# Patient Record
Sex: Male | Born: 1945 | Race: White | Hispanic: No | Smoking: Never smoker
Health system: Southern US, Community
[De-identification: ages and names within clinical notes are randomized; demographics above are authoritative.]

## PROBLEM LIST (undated history)

## (undated) DIAGNOSIS — E039 Hypothyroidism, unspecified: Secondary | ICD-10-CM

## (undated) DIAGNOSIS — Z87442 Personal history of urinary calculi: Secondary | ICD-10-CM

## (undated) DIAGNOSIS — K219 Gastro-esophageal reflux disease without esophagitis: Secondary | ICD-10-CM

## (undated) DIAGNOSIS — I472 Ventricular tachycardia: Secondary | ICD-10-CM

## (undated) DIAGNOSIS — S83242A Other tear of medial meniscus, current injury, left knee, initial encounter: Secondary | ICD-10-CM

## (undated) DIAGNOSIS — M1990 Unspecified osteoarthritis, unspecified site: Secondary | ICD-10-CM

## (undated) DIAGNOSIS — I493 Ventricular premature depolarization: Secondary | ICD-10-CM

## (undated) DIAGNOSIS — F32A Depression, unspecified: Secondary | ICD-10-CM

## (undated) DIAGNOSIS — G4730 Sleep apnea, unspecified: Secondary | ICD-10-CM

## (undated) DIAGNOSIS — I4729 Other ventricular tachycardia: Secondary | ICD-10-CM

## (undated) DIAGNOSIS — Z8719 Personal history of other diseases of the digestive system: Secondary | ICD-10-CM

## (undated) DIAGNOSIS — I10 Essential (primary) hypertension: Secondary | ICD-10-CM

## (undated) DIAGNOSIS — E079 Disorder of thyroid, unspecified: Secondary | ICD-10-CM

## (undated) DIAGNOSIS — F329 Major depressive disorder, single episode, unspecified: Secondary | ICD-10-CM

## (undated) DIAGNOSIS — C3490 Malignant neoplasm of unspecified part of unspecified bronchus or lung: Secondary | ICD-10-CM

## (undated) HISTORY — DX: Unspecified osteoarthritis, unspecified site: M19.90

## (undated) HISTORY — PX: RETINAL DETACHMENT SURGERY: SHX105

## (undated) HISTORY — PX: EYE SURGERY: SHX253

## (undated) HISTORY — PX: KNEE ARTHROSCOPY: SUR90

## (undated) HISTORY — PX: CATARACT EXTRACTION: SUR2

---

## 2001-02-02 ENCOUNTER — Emergency Department (HOSPITAL_COMMUNITY): Admission: EM | Admit: 2001-02-02 | Discharge: 2001-02-03 | Payer: Self-pay | Admitting: Emergency Medicine

## 2001-02-05 ENCOUNTER — Ambulatory Visit (HOSPITAL_COMMUNITY): Admission: RE | Admit: 2001-02-05 | Discharge: 2001-02-06 | Payer: Self-pay | Admitting: Ophthalmology

## 2001-02-05 ENCOUNTER — Encounter: Payer: Self-pay | Admitting: Ophthalmology

## 2001-12-05 ENCOUNTER — Encounter (HOSPITAL_COMMUNITY): Admission: RE | Admit: 2001-12-05 | Discharge: 2002-01-04 | Payer: Self-pay | Admitting: *Deleted

## 2001-12-10 ENCOUNTER — Ambulatory Visit (HOSPITAL_COMMUNITY): Admission: RE | Admit: 2001-12-10 | Discharge: 2001-12-11 | Payer: Self-pay | Admitting: Ophthalmology

## 2001-12-10 ENCOUNTER — Encounter: Payer: Self-pay | Admitting: Ophthalmology

## 2002-03-05 ENCOUNTER — Ambulatory Visit (HOSPITAL_COMMUNITY): Admission: RE | Admit: 2002-03-05 | Discharge: 2002-03-05 | Payer: Self-pay | Admitting: General Surgery

## 2002-12-19 ENCOUNTER — Encounter: Payer: Self-pay | Admitting: Family Medicine

## 2002-12-19 ENCOUNTER — Ambulatory Visit (HOSPITAL_COMMUNITY): Admission: RE | Admit: 2002-12-19 | Discharge: 2002-12-19 | Payer: Self-pay | Admitting: Family Medicine

## 2004-03-05 ENCOUNTER — Ambulatory Visit: Admission: RE | Admit: 2004-03-05 | Discharge: 2004-03-05 | Payer: Self-pay | Admitting: Family Medicine

## 2005-05-02 ENCOUNTER — Ambulatory Visit (HOSPITAL_COMMUNITY): Admission: RE | Admit: 2005-05-02 | Discharge: 2005-05-02 | Payer: Self-pay | Admitting: Family Medicine

## 2005-11-01 ENCOUNTER — Emergency Department (HOSPITAL_COMMUNITY): Admission: EM | Admit: 2005-11-01 | Discharge: 2005-11-01 | Payer: Self-pay | Admitting: Emergency Medicine

## 2007-01-18 ENCOUNTER — Ambulatory Visit (HOSPITAL_COMMUNITY): Admission: RE | Admit: 2007-01-18 | Discharge: 2007-01-18 | Payer: Self-pay | Admitting: Family Medicine

## 2007-01-19 ENCOUNTER — Emergency Department (HOSPITAL_COMMUNITY): Admission: EM | Admit: 2007-01-19 | Discharge: 2007-01-19 | Payer: Self-pay | Admitting: Emergency Medicine

## 2007-12-28 ENCOUNTER — Ambulatory Visit (HOSPITAL_COMMUNITY): Admission: RE | Admit: 2007-12-28 | Discharge: 2007-12-28 | Payer: Self-pay | Admitting: Family Medicine

## 2008-06-14 ENCOUNTER — Ambulatory Visit (HOSPITAL_COMMUNITY): Admission: RE | Admit: 2008-06-14 | Discharge: 2008-06-14 | Payer: Self-pay | Admitting: Family Medicine

## 2008-06-18 ENCOUNTER — Ambulatory Visit (HOSPITAL_COMMUNITY): Admission: RE | Admit: 2008-06-18 | Discharge: 2008-06-18 | Payer: Self-pay | Admitting: Family Medicine

## 2008-07-06 ENCOUNTER — Emergency Department (HOSPITAL_COMMUNITY): Admission: EM | Admit: 2008-07-06 | Discharge: 2008-07-06 | Payer: Self-pay | Admitting: Emergency Medicine

## 2008-09-12 HISTORY — PX: OTHER SURGICAL HISTORY: SHX169

## 2009-04-09 ENCOUNTER — Ambulatory Visit (HOSPITAL_COMMUNITY): Admission: RE | Admit: 2009-04-09 | Discharge: 2009-04-09 | Payer: Self-pay | Admitting: Family Medicine

## 2011-01-28 NOTE — Op Note (Signed)
[place]  Patient:    Micheal Baker, Micheal Baker Visit Number: [id number] MRN: [id number]          Service Type: DSU Location: [phone] Attending Physician:  Ladona Ridgel Dictated by:   Ladona Ridgel, M.D. Proc. 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  POSTOPERATIVE DIAGNOSES: 1. 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 2. Retinal break.  OPERATION:  Pars plana vitrectomy, posterior capsulectomy, membrane peeling and laser photocoagulation for retinal break and air/fluid change, left eye.  SURGEON:  Ladona Ridgel, M.D.  ANESTHESIA:  General endotracheal.  ESTIMATED BLOOD LOSS:  Less than 1 cc.  COMPLICATIONS:  None.  DESCRIPTION OF PROCEDURE:  The patient was taken to the operating room where after the induction of general anesthesia, the left eye was prepped and draped in the usual fashion.  Lid speculum was introduced.  Conjunctival peritomies were developed over the future sclerotomy sites at 1:30, 10:30 and 4. Hemostasis was obtained.  The sclerotomy was fashioned 3 mm posterior to the limbus in both locations.  The infusion cannula was secured with a suture of 7-0 Vicryl and found to be in good position.  The Landers ring was secured to the globe with 7-0 Vicryl sutures at 3 and 9.  A magnifying BIOM lens was applied to the surface of the eye.  The central vitreous haze was washed out. There was a linear area of preretinal fibrosis over the macula extending in a somewhat arcuate fashion inferotemporally.  There was central capsule fibrosis impeding the view of the posterior segment and so the posterior capsule was opened with the vitrector.  A Vicryl stitch was used to engage the margin over the thick preretinal fibrosis superonasally to the  fixation.  The dense membrane was then peeled off the macula temporally.  Temporally, the membrane was more adherent.  The retina was seen to tent up slightly.  The membrane was elevated and teased off the surface of the retina without any retinal breaks or tears.  Inspection of the macular area showed there was still to be some glistening surface.  An additional probe revealed there to be a second very adherent membrane underneath the first.  This was grasped with the Dorc forceps and peeled off the macula.  There was a large membrane that extended temporally.  The area that had been pulled up before was noted to have a small tear in this area.  The membrane was then peeled around this area of tear and then removed from the area of the tear so that no residual traction was seen. The macula was inspected, and no additional membranes were seen.  The instruments were removed from the eye and the holes were plugged.  The Landers ring was removed.  Inspect with the direct ophthalmoscope revealed there to be no peripheral retinal breaks or tears.  Attention was then directed back into the eye where a endolaser probe was used to photocoagulate around the margins of the tear, the tear being temporal to the macula.  A biconcave lens was then applied to the surface of the air, and an air-fluid exchange was performed. The superior sclerotomy was then closed with 7-0 Vicryl.  The conjunctivae was  drawn up and reapproximated with running sutures of 0 plain gut.  Pressure was checked with the Barraquer tonometer and found to be 15.  The subconjunctiva was irrigated with 0.75% Marcaine followed by subconjunctival injection of 100 mg of ceftazidime and 10 mg of Decadron. The lid speculum was then removed and an eye patch and shield were then placed over the patients eye.  The patient was awakened from anesthesia and left the operating room in stable condition. Dictated by:   Ladona Ridgel,  M.D. Attending Physician:  Ladona Ridgel DD:  12/10/01 TD:  12/10/01 Job: [id number]

## 2011-01-28 NOTE — Op Note (Signed)
[place]  Patient:    Micheal Baker, Micheal Baker                    MRN: [id number] Proc. Date: 02/05/01 Adm. 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  PROCEDURES:  Pars plana vitrectomy, laser for retinal breaks x 2, gas/fluid exchange - left eye.  SURGEON:  Ladona Ridgel, M.D.  ASSISTANT:  Ulysees Barns, LPN.  ANESTHESIA:  General endotracheal.  ESTIMATED BLOOD LOSS:  Less than 1 cc.  COMPLICATIONS:  None.  DESCRIPTION OF PROCEDURE:  The patient was taken to the operating room and after induction of general anesthesia, the left eye was prepped and draped in usual fashion.  A lid speculum was introduced temporally, an incision was made from 1 to 5 and nasally from 11 to 9 with relaxing incisions at 9 and 3. Hemostasis was obtained with eraser cautery.  Sclerotomies were then fashioned 3.75 mm posterior limbus at 1:30, 10:30 and 4.  The superior sclerotomies were plugged and a 4 mm infusion cannula was secured at the 4 oclock sclerotomy with a temporary suture of 7-0 Vicryl.  The tip was visually inspected and found to be in good position.  A Landers ring was then secured to the globe with 7-0 Vicryl suture at 3 and 9.  The plugs were removed and 30 degree prismatic lens applied to the surface of the eye.   A central core, followed by a peripheral vitrectomy was performed.  There was considerable intravitreal hemorrhage centrally and once the core vitrectomy had been cleared of the posterior segment, peripheral vitrectomy was performed.  There was a large retinal break with a localized detachment noted preoperatively at approximately 12:30 and this was confirmed intraoperatively.   There was also a flap horseshoe tear with a small pigmented surface and a very small area surrounding subretinal fluid at approximately 4:30.  No other breaks or tears were seen, and scleral depression was used to trim the vitreous base 360 degrees.  The instruments were removed from the eye and holes plugged. Inspection with scleral depression and the indirect ophthalmoscope revealed there to be no additional retinal breaks or tears.  The headscope laser was then used to photocoagulate the attached retina, as well as scattered photocoagulations around the breaks using scleral depressions to approxdimate the retina to the right neural pigmented epithelium.  An extrusion cannula was then used with the indirect ophthalmoscope to perform a gas/fluid exchange, and then additional laser photocoagulation was applied in and around both breaks.  A small amount of fluid settled posteriorly, but not into the macular area from the localized attachment superiorly.  A mixture of 16% C3F8 was drawn up in the usual sterile fashion and then the fluid that had settled posteriorly was removed with the biconcave lens and the extrusion cannula.  The superonasal sclerotomy was permanently closed and then 20 cc of a 16% C3F8 mixture infused with the infusion cannula with egress to the remaining sclerotomy.  The remaining sclerotomy was then closed.  The infusion cannula was removed and preplaced sutures secured.  The pressure was adjusted to 21 mm Barraquer by additional injection of the filtered  gas mixture with a 30-gauge needle 3.75 mm posterior limbus 11 oclock. The pressure was rechecked and found to be 21 once the needle had been removed.  The conjunctiva was then drawn up and reapproximated with interrupted running sutures of 6-0 plain gut. The subconjunctival space was then irrigated with 0.75% Marcaine followed by subconjunctival injections of 100 mg of ceftazidime and 25 mg of Decadron.  The lid  speculum was then removed and a mixed antibiotic ointment was applied to the surface of the eye.  Eye patch and shield was then placed over the patients eye.  Following awakening from anesthesia, patient left the operating room in stable condition. DD:  02/05/01 TD:  02/05/01 Job: [id number]

## 2011-06-13 LAB — D-DIMER, QUANTITATIVE: D-Dimer, Quant: 0.28

## 2011-06-13 LAB — DIFFERENTIAL
Basophils Relative: 0
Eosinophils Absolute: 0.2
Eosinophils Relative: 5
Lymphocytes Relative: 26
Monocytes Absolute: 0.5
Neutrophils Relative %: 61

## 2011-06-13 LAB — BASIC METABOLIC PANEL
BUN: 14
CO2: 26
Chloride: 106
GFR calc Af Amer: >60
Glucose, Bld: 103 — ABNORMAL HIGH
Sodium: 138

## 2011-06-13 LAB — POCT CARDIAC MARKERS
CKMB, poc: 1.7
Troponin i, poc: <0.05

## 2011-06-13 LAB — CBC
MCHC: 35.2
RBC: 5.13
WBC: 5.5

## 2011-08-08 ENCOUNTER — Encounter: Payer: Self-pay | Admitting: *Deleted

## 2011-08-08 ENCOUNTER — Observation Stay (HOSPITAL_COMMUNITY)
Admission: EM | Admit: 2011-08-08 | Discharge: 2011-08-09 | Disposition: A | Discharge status: Home / Self Care | Payer: BC Managed Care – PPO | Attending: Internal Medicine | Admitting: Internal Medicine

## 2011-08-08 ENCOUNTER — Emergency Department (HOSPITAL_COMMUNITY): Payer: BC Managed Care – PPO

## 2011-08-08 DIAGNOSIS — R05 Cough: Secondary | ICD-10-CM | POA: Insufficient documentation

## 2011-08-08 DIAGNOSIS — I10 Essential (primary) hypertension: Secondary | ICD-10-CM | POA: Diagnosis present

## 2011-08-08 DIAGNOSIS — E039 Hypothyroidism, unspecified: Secondary | ICD-10-CM | POA: Diagnosis present

## 2011-08-08 DIAGNOSIS — R42 Dizziness and giddiness: Secondary | ICD-10-CM | POA: Insufficient documentation

## 2011-08-08 DIAGNOSIS — R059 Cough, unspecified: Secondary | ICD-10-CM | POA: Insufficient documentation

## 2011-08-08 DIAGNOSIS — R0789 Other chest pain: Principal | ICD-10-CM | POA: Diagnosis present

## 2011-08-08 HISTORY — DX: Disorder of thyroid, unspecified: E07.9

## 2011-08-08 HISTORY — DX: Essential (primary) hypertension: I10

## 2011-08-08 LAB — URINALYSIS, ROUTINE W REFLEX MICROSCOPIC
Bilirubin Urine: NEGATIVE
Hgb urine dipstick: NEGATIVE
Ketones, ur: NEGATIVE mg/dL
Urobilinogen, UA: 0.2 mg/dL (ref 0.0–1.0)
pH: 7 (ref 5.0–8.0)

## 2011-08-08 LAB — COMPREHENSIVE METABOLIC PANEL
ALT: 21 U/L (ref 0–53)
AST: 25 U/L (ref 0–37)
Alkaline Phosphatase: 68 U/L (ref 39–117)
BUN: 14 mg/dL (ref 6–23)
Chloride: 101 mEq/L (ref 96–112)
Creatinine, Ser: 0.96 mg/dL (ref 0.50–1.35)
GFR calc Af Amer: >90 mL/min (ref >90)
GFR calc non Af Amer: 86 mL/min — ABNORMAL LOW (ref >90)
Potassium: 3.5 mEq/L (ref 3.5–5.1)
Sodium: 139 mEq/L (ref 135–145)
Total Protein: 6.9 g/dL (ref 6.0–8.3)

## 2011-08-08 LAB — CBC
HCT: 46.3 % (ref 39.0–52.0)
Platelets: 205 10*3/uL (ref 150–400)
RDW: 13 % (ref 11.5–15.5)

## 2011-08-08 LAB — D-DIMER, QUANTITATIVE: D-Dimer, Quant: 0.22 ug/mL-FEU (ref 0.00–0.48)

## 2011-08-08 LAB — CARDIAC PANEL(CRET KIN+CKTOT+MB+TROPI)
CK, MB: 6.6 ng/mL (ref 0.3–4.0)
Relative Index: 3.4 — ABNORMAL HIGH (ref 0.0–2.5)
Total CK: 204 U/L (ref 7–232)
Total CK: 166 U/L (ref 7–232)
Troponin I: <0.3 ng/mL (ref <0.30)

## 2011-08-08 MED ORDER — ZOLPIDEM TARTRATE 5 MG PO TABS: 10.0000 mg | ORAL_TABLET | Freq: Every evening | ORAL | Status: DC | PRN

## 2011-08-08 MED ORDER — MORPHINE SULFATE 4 MG/ML IJ SOLN
0.5000 mg | Freq: Once | INTRAMUSCULAR | Status: AC
Start: 2011-08-08 — End: 2011-08-08

## 2011-08-08 MED ORDER — ACETAMINOPHEN 325 MG PO TABS
650.0000 mg | ORAL_TABLET | Freq: Four times a day (QID) | ORAL | Status: DC | PRN
  Administered 2011-08-08: 650 mg via ORAL
  Filled 2011-08-08: qty 2

## 2011-08-08 MED ORDER — ONDANSETRON HCL 4 MG/2ML IJ SOLN: 4.0000 mg | Freq: Three times a day (TID) | INTRAMUSCULAR | Status: AC | PRN

## 2011-08-08 MED ORDER — METOPROLOL SUCCINATE ER 25 MG PO TB24
25.0000 mg | ORAL_TABLET | Freq: Every day | ORAL | Status: DC
  Administered 2011-08-09: 25 mg via ORAL
  Filled 2011-08-08: qty 1

## 2011-08-08 MED ORDER — HYDROMORPHONE HCL PF 1 MG/ML IJ SOLN: 1.0000 mg | INTRAMUSCULAR | Status: AC | PRN

## 2011-08-08 MED ORDER — SODIUM CHLORIDE 0.9 % IV SOLN: 250.0000 mL | INTRAVENOUS | Status: DC

## 2011-08-08 MED ORDER — SODIUM CHLORIDE 0.9 % IJ SOLN
3.0000 mL | Freq: Two times a day (BID) | INTRAMUSCULAR | Status: DC
  Filled 2011-08-08: qty 3

## 2011-08-08 MED ORDER — NITROGLYCERIN 2 % TD OINT
0.5000 [in_us] | TOPICAL_OINTMENT | Freq: Once | TRANSDERMAL | Status: AC
  Administered 2011-08-08: 0.5 [in_us] via TOPICAL
  Filled 2011-08-08: qty 30

## 2011-08-08 MED ORDER — ROPINIROLE HCL 0.25 MG PO TABS
0.5000 mg | ORAL_TABLET | Freq: Every evening | ORAL | Status: DC | PRN
  Filled 2011-08-08: qty 2

## 2011-08-08 MED ORDER — GUAIFENESIN-DM 100-10 MG/5ML PO SYRP: 5.0000 mL | ORAL_SOLUTION | ORAL | Status: DC | PRN

## 2011-08-08 MED ORDER — SODIUM CHLORIDE 0.9 % IJ SOLN: 3.0000 mL | INTRAMUSCULAR | Status: DC | PRN

## 2011-08-08 MED ORDER — SENNA 8.6 MG PO TABS: 2.0000 | ORAL_TABLET | Freq: Every day | ORAL | Status: DC | PRN

## 2011-08-08 MED ORDER — ACETAMINOPHEN 650 MG RE SUPP: 650.0000 mg | Freq: Four times a day (QID) | RECTAL | Status: DC | PRN

## 2011-08-08 MED ORDER — ASPIRIN 81 MG PO CHEW
324.0000 mg | CHEWABLE_TABLET | Freq: Once | ORAL | Status: AC
  Administered 2011-08-08: 324 mg via ORAL
  Filled 2011-08-08: qty 4

## 2011-08-08 MED ORDER — ALUM & MAG HYDROXIDE-SIMETH 200-200-20 MG/5ML PO SUSP
30.0000 mL | Freq: Four times a day (QID) | ORAL | Status: DC | PRN
Start: 2011-08-08 — End: 2011-08-09

## 2011-08-08 MED ORDER — LEVOTHYROXINE SODIUM 50 MCG PO TABS
50.0000 ug | ORAL_TABLET | Freq: Every day | ORAL | Status: DC
  Administered 2011-08-09: 50 ug via ORAL
  Filled 2011-08-08: qty 1

## 2011-08-08 MED ORDER — HYDROCODONE-ACETAMINOPHEN 5-325 MG PO TABS
ORAL_TABLET | ORAL | Status: AC
  Administered 2011-08-08: 2 via ORAL
  Filled 2011-08-08: qty 2

## 2011-08-08 MED ORDER — HYDROCODONE-ACETAMINOPHEN 5-325 MG PO TABS
1.0000 | ORAL_TABLET | ORAL | Status: DC | PRN
  Administered 2011-08-08 – 2011-08-09 (×2): 2 via ORAL
  Filled 2011-08-08: qty 2

## 2011-08-08 MED ORDER — ENOXAPARIN SODIUM 40 MG/0.4ML ~~LOC~~ SOLN
40.0000 mg | SUBCUTANEOUS | Status: DC
  Administered 2011-08-08: 40 mg via SUBCUTANEOUS
  Filled 2011-08-08: qty 0.4

## 2011-08-08 MED ORDER — OMEGA-3-ACID ETHYL ESTERS 1 G PO CAPS
2.0000 g | ORAL_CAPSULE | Freq: Every day | ORAL | Status: DC
  Administered 2011-08-08 – 2011-08-09 (×2): 2 g via ORAL
  Filled 2011-08-08 (×2): qty 2

## 2011-08-08 MED ORDER — MORPHINE SULFATE 4 MG/ML IJ SOLN
INTRAMUSCULAR | Status: AC
  Administered 2011-08-08: 0.5 mg
  Filled 2011-08-08: qty 1

## 2011-08-08 MED ORDER — MORPHINE SULFATE 4 MG/ML IJ SOLN: 2.0000 mg | INTRAMUSCULAR | Status: DC | PRN

## 2011-08-08 MED ORDER — OMEGA-3 FATTY ACIDS 1000 MG PO CAPS: 2.0000 g | ORAL_CAPSULE | Freq: Every day | ORAL | Status: DC

## 2011-08-08 NOTE — ED Notes (Signed)
CRITICAL VALUE ALERT  Critical value received: CKMB 6.6  Date of notification:08-08-11  Time of notification: *0825  Critical value read back:yes  Nurse who received alert:  Alfredo Martinez RN  MD notified (1st page):  Jeraldine Loots  Time of first page: 0825  MD notified (2nd page):  Time of second page:  Responding MD:  Jeraldine Loots- no new orders received  Time MD responded:  0825

## 2011-08-08 NOTE — ED Notes (Signed)
Pt c/o headache. Dr. Jeraldine Loots made aware and new order received

## 2011-08-08 NOTE — ED Notes (Signed)
Pt states that he woke up this morning and was having chest pain at 5:30. Denies shortness of breath or nausea. Pt states that he went to the grocery store yesterday and became dizzy and felt like he was going to pass out. Pt denies dizziness at this time. Pt states "I just don't feel right."

## 2011-08-08 NOTE — ED Notes (Signed)
ISTAT Trop: 0.00 

## 2011-08-08 NOTE — H&P (Signed)
Hospital Admission Note Date: 08/08/2011  Patient name: Micheal Baker Medical record number: [id number] Date of birth: March 01, 1946 Age: 65 y.o. Gender: male PCP: Kirk Ruths, MD  Attending physician: Christiane Ha  Chief Complaint: Chest pain  History of Present Illness:  Micheal Baker is an 65 y.o. male who presents with intermittent left-sided chest pain. He reports that this dull. It usually lasts a few minutes at a time but lasted for about an hour today. It was worse with leaning over. It was not exertional. He has had a slight cough. He exercises 3 times a week without difficulty. He had an episode of lightheadedness yesterday. He has a history of hypertension and hypothyroidism. He had a negative Cardiolite about 5 years ago with [facility] per his report. There is no radiation of the pain. No other symptoms. EKG and cardiac enzymes were significant for a slightly elevated MB fraction only. He did have recent travel to [facility] and back via car. He has never had a blood clot. He's been eating and drinking well.  Past Medical History  Diagnosis Date  . Hypertension   . Thyroid disease     Meds: Prescriptions prior to admission  Medication Sig Dispense Refill  . fish oil-omega-3 fatty acids 1000 MG capsule Take 2 g by mouth daily.        Marland Kitchen glucosamine-chondroitin 500-400 MG tablet Take 1 tablet by mouth daily.        Marland Kitchen levothyroxine (SYNTHROID, LEVOTHROID) 50 MCG tablet Take 50 mcg by mouth daily.        . metoprolol (TOPROL-XL) 50 MG 24 hr tablet Take 25 mg by mouth daily.        Marland Kitchen rOPINIRole (REQUIP) 0.5 MG tablet Take 0.5 mg by mouth at bedtime as needed. Restless leg         Allergies: Penicillins History   Social History  . Marital Status: Divorced    Spouse Name: N/A    Number of Children: N/A  . Years of Education: N/A   Occupational History  . Not on file.   Social History Main Topics  . Smoking status: Never Smoker   .  Smokeless tobacco: Not on file  . Alcohol Use: Yes     occasionally  . Drug Use: No  . Sexually Active:    Other Topics Concern  . Not on file   Social History Narrative  . No narrative on file   History reviewed. No pertinent family history. Past Surgical History  Procedure Date  . Retinal detachment surgery     left  . Cataract extraction     left  . Eye surgery     right  . Knee arthroscopy     left  . Groin surgery     growth removed     Review of Systems: Systems reviewed and as per HPI, otherwise negative.  Physical Exam: Blood pressure 110/69, pulse 54, temperature 97.1 F (36.2 C), temperature source Oral, resp. rate 16, height 6\' 5"  (1.956 m), weight 90.7 kg (199 lb 15.3 oz), SpO2 97.00%. BP 110/69  Pulse 54  Temp(Src) 97.1 F (36.2 C) (Oral)  Resp 16  Ht 6\' 5"  (1.956 m)  Wt 90.7 kg (199 lb 15.3 oz)  BMI 23.71 kg/m2  SpO2 97%  General Appearance:    Alert, cooperative, no distress, appears stated age  Head:    Normocephalic, without obvious abnormality, atraumatic  Eyes:    PERRL, conjunctiva/corneas clear, EOM's intact,  fundi    benign, both eyes       Ears:    Normal TM's and external ear canals, both ears  Nose:   Nares normal, septum midline, mucosa normal, no drainage    or sinus tenderness  Throat:   Lips, mucosa, and tongue normal; teeth and gums normal  Neck:   Supple, symmetrical, trachea midline, no adenopathy;       thyroid:  No enlargement/tenderness/nodules; no carotid   bruit or JVD  Back:     Symmetric, no curvature, ROM normal, no CVA tenderness  Lungs:     Clear to auscultation bilaterally, respirations unlabored  Chest wall:    No tenderness or deformity  Heart:    Regular rate and rhythm, S1 and S2 normal, no murmur, rub   or gallop  Abdomen:     Soft, non-tender, bowel sounds active all four quadrants,    no masses, no organomegaly  Genitalia:   deferred   Rectal:   deferred   Extremities:   Extremities normal, atraumatic, no  cyanosis or edema  Pulses:   2+ and symmetric all extremities  Skin:   Skin color, texture, turgor normal, no rashes or lesions  Lymph nodes:   Cervical, supraclavicular, and axillary nodes normal  Neurologic:   CNII-XII intact. Normal strength, sensation and reflexes      throughout    Psychiatric: Normal affect.  Lab results: Basic Metabolic Panel:  Basename 08/08/11 0745  NA 139  K 3.5  CL 101  CO2 31  GLUCOSE 103*  BUN 14  CREATININE 0.96  CALCIUM 9.5  MG --  PHOS --   Liver Function Tests:  Basename 08/08/11 0745  AST 25  ALT 21  ALKPHOS 68  BILITOT 0.7  PROT 6.9  ALBUMIN 3.9   No results found for this basename: LIPASE:2,AMYLASE:2 in the last 72 hours No results found for this basename: AMMONIA:2 in the last 72 hours CBC:  Basename 08/08/11 0745  WBC 5.3  NEUTROABS --  HGB 15.9  HCT 46.3  MCV 87.7  PLT 205   Cardiac Enzymes:  Basename 08/08/11 1454 08/08/11 0745  CKTOTAL 166 204  CKMB 5.6* 6.6*  CKMBINDEX -- --  TROPONINI <0.30 <0.30   EKG shows sinus bradycardia with rate of 55 nonspecific changes  Imaging results:  Dg Chest 2 View  08/08/2011  *RADIOLOGY REPORT*  Clinical Data: History of chest pain.  History of hypertension.  CHEST - 2 VIEW  Comparison: 07/06/2008 study.  Findings: Cardiac silhouette is normal size and shape. The heart appears stable.  Mediastinal and hilar contours appear stable. Hilar and pulmonary granulomatous calcifications are present without significant change.  No active granulomatous process is evident.  No pleural abnormality is evident.Bones  appear average for age.  IMPRESSION: Stable appearance of chest.  Evidence of previous granulomatous infection with calcifications.  No active granulomatous process evident.  No acute or active abnormality is evident.  Original Report Authenticated By: Crawford Givens, M.D.    Assessment & Plan: Principal Problem:  *Chest pain Active Problems:  Benign hypertension   Hypothyroidism  Patient will be placed on observation. Rule out MI. Check d-dimer and if positive a CT angiogram of the chest. If workup is unremarkable and he feels better, consider discharge with outpatient followup with [facility]. Lexx Monte L 08/08/2011, 4:27 PM

## 2011-08-08 NOTE — ED Notes (Signed)
0.5mg  Morphine IV given x 1 dose

## 2011-08-08 NOTE — ED Provider Notes (Signed)
History   This chart was scribed for Micheal Munch, MD by Clarita Crane. The patient was seen in room APA10 and the patient's care was started at 7:40AM.   CSN: [id number] Arrival date & time: 08/08/2011  7:26 AM   None     Chief Complaint  Patient presents with  . Chest Pain    (Consider location/radiation/quality/duration/timing/severity/associated sxs/prior treatment) The history is provided by the patient.   Micheal Baker is a 65 y.o. male who presents to the Emergency Department complaining of waxing and waning dull, left chest pain that does not radiate with onset this morning. Pt reports associated symptom of slight headache and near syncope yesterday. He states that pain at present is 3 and at worse 6 out of 10 with shaving and bending motion aggravating the pain but denies pain with exertion. Pt denies cough, cold, chills, dysuria, nausea, diarrhea, and vomiting.Takes medication for hyptertension and thyroid disease. Pt does not smoke but drinks alcohol weekly (max 3-4 of beers). Patient reports having cardiac stress test performed approximately 5 yrs ago. Family history of heart attacks (uncle) under age of 11.  PCP is Dr. Regino Schultze   Past Medical History  Diagnosis Date  . Hypertension   . Thyroid disease     Past Surgical History  Procedure Date  . Retinal detachment surgery     left  . Cataract extraction     left  . Eye surgery     right  . Knee arthroscopy     left  . Groin surgery     growth removed     History reviewed. No pertinent family history.  History  Substance Use Topics  . Smoking status: Never Smoker   . Smokeless tobacco: Not on file  . Alcohol Use: Yes     occasionally      Review of Systems  All other systems reviewed and are negative.  10 Systems reviewed and are negative for acute change except as noted in the HPI.  Allergies  Penicillins  Home Medications  No current outpatient prescriptions on file.  Triage vitals: BP  141/82  Temp(Src) 97.4 F (36.3 C) (Oral)  Resp 18  Ht 6\' 5"  (1.956 m)  Wt 195 lb (88.451 kg)  BMI 23.12 kg/m2  SpO2 100%  Physical Exam  Nursing note and vitals reviewed. Constitutional: He is oriented to person, place, and time. He appears well-developed and well-nourished. No distress.  HENT:  Head: Normocephalic and atraumatic.  Eyes: EOM are normal. Pupils are equal, round, and reactive to light.  Neck: Neck supple. No tracheal deviation present.  Cardiovascular: Normal rate, regular rhythm and normal heart sounds.   No murmur heard. Pulmonary/Chest: Effort normal and breath sounds normal. No respiratory distress. He has no wheezes. He exhibits tenderness (mild).  Abdominal: Soft. He exhibits no distension. There is no tenderness.  Musculoskeletal: Normal range of motion. He exhibits no edema.  Neurological: He is alert and oriented to person, place, and time. No sensory deficit.  Skin: Skin is warm and dry. No rash noted.  Psychiatric: He has a normal mood and affect. His behavior is normal.    ED Course  Procedures (including critical care time)  DIAGNOSTIC STUDIES: Oxygen Saturation is 100% on room, normal by my interpretation.    COORDINATION OF CARE:    Labs Reviewed  CARDIAC PANEL(CRET KIN+CKTOT+MB+TROPI) - Abnormal; Notable for the following:    CK, MB 6.6 (*)    Relative Index 3.2 (*)    All  other components within normal limits  COMPREHENSIVE METABOLIC PANEL - Abnormal; Notable for the following:    Glucose, Bld 103 (*)    GFR calc non Af Amer 86 (*)    All other components within normal limits  CBC  POCT I-STAT TROPONIN I  URINALYSIS, ROUTINE W REFLEX MICROSCOPIC  I-STAT TROPONIN I   Dg Chest 2 View  08/08/2011  *RADIOLOGY REPORT*  Clinical Data: History of chest pain.  History of hypertension.  CHEST - 2 VIEW  Comparison: 07/06/2008 study.  Findings: Cardiac silhouette is normal size and shape. The heart appears stable.  Mediastinal and hilar  contours appear stable. Hilar and pulmonary granulomatous calcifications are present without significant change.  No active granulomatous process is evident.  No pleural abnormality is evident.Bones  appear average for age.  IMPRESSION: Stable appearance of chest.  Evidence of previous granulomatous infection with calcifications.  No active granulomatous process evident.  No acute or active abnormality is evident.  Original Report Authenticated By: Crawford Givens, M.D.     No diagnosis found.   Date: 08/08/2011  Rate: 55  Rhythm: sinus bradycardia  QRS Axis: normal  Intervals: normal  ST/T Wave abnormalities: nonspecific T wave changes  Conduction Disutrbances:none  Narrative Interpretation:   Old EKG Reviewed: none available    MDM  This generally well-appearing male presents with several hours of chest pain. The patient awoke with pain, and since onset the pain has been unpredictable.  The patient has minimal personal risk factors, though he has a notable family history for ischemic heart disease. The patient's labs are notable for an elevated CK-MB. Although the patient is in no distress, has a reassuring physical exam and EKG, as well as an unremarkable x-ray, there is concern for this elevation in cardiac enzymes, and the patient will be admitted for possible nstemi.       I personally performed the services described in this documentation, which was scribed in my presence. The recorded information has been reviewed and considered.    Micheal Munch, MD 08/08/11 0930

## 2011-08-09 LAB — CARDIAC PANEL(CRET KIN+CKTOT+MB+TROPI)
Relative Index: 3.7 — ABNORMAL HIGH (ref 0.0–2.5)
Troponin I: <0.3 ng/mL (ref <0.30)

## 2011-08-09 LAB — TSH: TSH: 2.21 u[IU]/mL (ref 0.350–4.500)

## 2011-08-09 MED ORDER — HYDROCODONE-ACETAMINOPHEN 5-500 MG PO TABS: 1.0000 | ORAL_TABLET | Freq: Four times a day (QID) | ORAL | Status: AC | PRN

## 2011-08-09 NOTE — Discharge Summary (Signed)
Physician Discharge Summary  Patient ID: Micheal Baker MRN: [id number] DOB/AGE: Jul 19, 1946 65 y.o.  Admit date: 08/08/2011 Discharge date: 08/09/2011  Discharge Diagnoses:  Principal Problem:  *Chest pain Active Problems:  Benign hypertension  Hypothyroidism   Current Discharge Medication List    START taking these medications   Details  HYDROcodone-acetaminophen (VICODIN) 5-500 MG per tablet Take 1 tablet by mouth every 6 (six) hours as needed for pain. Qty: 10 tablet, Refills: 0      CONTINUE these medications which have NOT CHANGED   Details  fish oil-omega-3 fatty acids 1000 MG capsule Take 2 g by mouth daily.      glucosamine-chondroitin 500-400 MG tablet Take 1 tablet by mouth daily.      levothyroxine (SYNTHROID, LEVOTHROID) 50 MCG tablet Take 50 mcg by mouth daily.      metoprolol (TOPROL-XL) 50 MG 24 hr tablet Take 25 mg by mouth daily.      rOPINIRole (REQUIP) 0.5 MG tablet Take 0.5 mg by mouth at bedtime as needed. Restless leg         Discharge Orders    Future Orders Please Complete By Expires   Diet - low sodium heart healthy      Activity as tolerated - No restrictions         Follow-up Information    Follow up with [facility]. Make an appointment in 1 week.         Disposition:   home  Discharged Condition: Stable  Consults:   none  Labs:   Results for orders placed during the hospital encounter of 08/08/11 (from the past 48 hour(s))  POCT I-STAT TROPONIN I     Status: Normal   Collection Time   08/08/11  7:31 AM      Component Value Range Comment   Troponin i, poc 0.00  0.00 - 0.08 (ng/mL)    Comment 3            CBC     Status: Normal   Collection Time   08/08/11  7:45 AM      Component Value Range Comment   WBC 5.3  4.0 - 10.5 (K/uL)    RBC 5.28  4.22 - 5.81 (MIL/uL)    Hemoglobin 15.9  13.0 - 17.0 (g/dL)    HCT 40.9  81.1 - 91.4 (%)    MCV 87.7  78.0 - 100.0 (fL)    MCH 30.1  26.0 -  34.0 (pg)    MCHC 34.3  30.0 - 36.0 (g/dL)    RDW 78.2  95.6 - 21.3 (%)    Platelets 205  150 - 400 (K/uL)   CARDIAC PANEL(CRET KIN+CKTOT+MB+TROPI)     Status: Abnormal   Collection Time   08/08/11  7:45 AM      Component Value Range Comment   Total CK 204  7 - 232 (U/L)    CK, MB 6.6 (*) 0.3 - 4.0 (ng/mL)    Troponin I <0.30  <0.30 (ng/mL)    Relative Index 3.2 (*) 0.0 - 2.5    COMPREHENSIVE METABOLIC PANEL     Status: Abnormal   Collection Time   08/08/11  7:45 AM      Component Value Range Comment   Sodium 139  135 - 145 (mEq/L)    Potassium 3.5  3.5 - 5.1 (mEq/L)    Chloride 101  96 - 112 (mEq/L)    CO2 31  19 - 32 (mEq/L)  Glucose, Bld 103 (*) 70 - 99 (mg/dL)    BUN 14  6 - 23 (mg/dL)    Creatinine, Ser 1.61  0.50 - 1.35 (mg/dL)    Calcium 9.5  8.4 - 10.5 (mg/dL)    Total Protein 6.9  6.0 - 8.3 (g/dL)    Albumin 3.9  3.5 - 5.2 (g/dL)    AST 25  0 - 37 (U/L)    ALT 21  0 - 53 (U/L)    Alkaline Phosphatase 68  39 - 117 (U/L)    Total Bilirubin 0.7  0.3 - 1.2 (mg/dL)    GFR calc non Af Amer 86 (*) >90 (mL/min)    GFR calc Af Amer >90  >90 (mL/min)   URINALYSIS, ROUTINE W REFLEX MICROSCOPIC     Status: Normal   Collection Time   08/08/11  8:00 AM      Component Value Range Comment   Color, Urine YELLOW  YELLOW     Appearance CLEAR  CLEAR     Specific Gravity, Urine 1.005  1.005 - 1.030     pH 7.0  5.0 - 8.0     Glucose, UA NEGATIVE  NEGATIVE (mg/dL)    Hgb urine dipstick NEGATIVE  NEGATIVE     Bilirubin Urine NEGATIVE  NEGATIVE     Ketones, ur NEGATIVE  NEGATIVE (mg/dL)    Protein, ur NEGATIVE  NEGATIVE (mg/dL)    Urobilinogen, UA 0.2  0.0 - 1.0 (mg/dL)    Nitrite NEGATIVE  NEGATIVE     Leukocytes, UA NEGATIVE  NEGATIVE  MICROSCOPIC NOT DONE ON URINES WITH NEGATIVE PROTEIN, BLOOD, LEUKOCYTES, NITRITE, OR GLUCOSE <1000 mg/dL.  CARDIAC PANEL(CRET KIN+CKTOT+MB+TROPI)     Status: Abnormal   Collection Time   08/08/11  2:54 PM      Component Value Range Comment    Total CK 166  7 - 232 (U/L)    CK, MB 5.6 (*) 0.3 - 4.0 (ng/mL)    Troponin I <0.30  <0.30 (ng/mL)    Relative Index 3.4 (*) 0.0 - 2.5    TSH     Status: Normal   Collection Time   08/08/11  2:54 PM      Component Value Range Comment   TSH 2.210  0.350 - 4.500 (uIU/mL)   D-DIMER, QUANTITATIVE     Status: Normal   Collection Time   08/08/11  2:54 PM      Component Value Range Comment   D-Dimer, Quant 0.22  0.00 - 0.48 (ug/mL-FEU)   CARDIAC PANEL(CRET KIN+CKTOT+MB+TROPI)     Status: Abnormal   Collection Time   08/09/11 12:47 AM      Component Value Range Comment   Total CK 158  7 - 232 (U/L)    CK, MB 5.8 (*) 0.3 - 4.0 (ng/mL)    Troponin I <0.30  <0.30 (ng/mL)    Relative Index 3.7 (*) 0.0 - 2.5      Diagnostics:  Dg Chest 2 View  08/08/2011  *RADIOLOGY REPORT*  Clinical Data: History of chest pain.  History of hypertension.  CHEST - 2 VIEW  Comparison: 07/06/2008 study.  Findings: Cardiac silhouette is normal size and shape. The heart appears stable.  Mediastinal and hilar contours appear stable. Hilar and pulmonary granulomatous calcifications are present without significant change.  No active granulomatous process is evident.  No pleural abnormality is evident.Bones  appear average for age.  IMPRESSION: Stable appearance of chest.  Evidence of previous granulomatous infection with calcifications.  No  active granulomatous process evident.  No acute or active abnormality is evident.  Original Report Authenticated By: Crawford Givens, M.D.    Procedures:  EKG: shows sinus bradycardia with rate of 55 nonspecific changes  Full Code   Hospital Course: See H&P for complete admission details. The patient is a 65 year old healthy and very active white male with history of hypertension who presented with intermittent atypical chest pain for several days. He reports having had a negative stress test with [facility] 5 years ago. His EKG was unremarkable. He had no  reproducibility of the pain. He was observed overnight on telemetry. He ruled out for MI. D-dimer was negative. I suspect this is musculoskeletal however it would be reasonable to repeat a stress test. Defer to [facility]. His physical examination has been normal.  Discharge Exam:  Blood pressure 114/75, pulse 52, temperature 97.9 F (36.6 C), temperature source Oral, resp. rate 18, height 6\' 5"  (1.956 m), weight 90.7 kg (199 lb 15.3 oz), SpO2 97.00%.  Unchanged from 08/08/2011   Signed: Crista Curb L 08/09/2011, 12:41 PM

## 2011-08-09 NOTE — Progress Notes (Signed)
Patient discharged home with family.

## 2011-08-09 NOTE — Progress Notes (Signed)
CARE MANAGEMENT NOTE 08/09/2011  Patient:  Micheal Baker, Micheal Baker   Account Number:  [id number]  Date Initiated:  08/09/2011  Documentation initiated by:  Rosemary Holms  Subjective/Objective Assessment:   Patient admitted with chest pain     Action/Plan:   PTA, Pt was independent with ADL. No HH needs identified.   Anticipated DC Date:  08/09/2011   Anticipated DC Plan:  HOME/SELF CARE      DC Planning Services  CM consult      Choice offered to / List presented to:             Status of service:  Completed, signed off Medicare Important Message given?   (If response is "NO", the following Medicare IM given date fields will be blank) Date Medicare IM given:   Date Additional Medicare IM given:    Discharge Disposition:    Per UR Regulation:    Comments:  08/09/11 11:30 Shanta Dorvil Leanord Hawking RN BSN CM

## 2011-09-08 ENCOUNTER — Other Ambulatory Visit: Payer: Self-pay | Admitting: Internal Medicine

## 2011-09-08 ENCOUNTER — Encounter (HOSPITAL_COMMUNITY): Payer: Self-pay

## 2011-09-09 MED ORDER — SODIUM CHLORIDE 0.9 % IJ SOLN: 3.0000 mL | Freq: Two times a day (BID) | INTRAMUSCULAR | Status: DC

## 2011-09-09 MED ORDER — SODIUM CHLORIDE 0.9 % IJ SOLN: 3.0000 mL | INTRAMUSCULAR | Status: DC | PRN

## 2011-09-09 MED ORDER — SODIUM CHLORIDE 0.9 % IV SOLN: 250.0000 mL | INTRAVENOUS | Status: DC | PRN

## 2011-09-13 ENCOUNTER — Encounter (HOSPITAL_COMMUNITY): Payer: Self-pay

## 2011-09-13 ENCOUNTER — Emergency Department (HOSPITAL_COMMUNITY)
Admission: EM | Admit: 2011-09-13 | Discharge: 2011-09-13 | Disposition: A | Discharge status: Home / Self Care | Payer: BC Managed Care – PPO | Attending: Emergency Medicine | Admitting: Emergency Medicine

## 2011-09-13 ENCOUNTER — Other Ambulatory Visit: Payer: Self-pay

## 2011-09-13 DIAGNOSIS — E079 Disorder of thyroid, unspecified: Secondary | ICD-10-CM | POA: Insufficient documentation

## 2011-09-13 DIAGNOSIS — Z79899 Other long term (current) drug therapy: Secondary | ICD-10-CM | POA: Insufficient documentation

## 2011-09-13 DIAGNOSIS — Z7982 Long term (current) use of aspirin: Secondary | ICD-10-CM | POA: Insufficient documentation

## 2011-09-13 DIAGNOSIS — M549 Dorsalgia, unspecified: Secondary | ICD-10-CM | POA: Insufficient documentation

## 2011-09-13 DIAGNOSIS — R079 Chest pain, unspecified: Secondary | ICD-10-CM | POA: Insufficient documentation

## 2011-09-13 DIAGNOSIS — I10 Essential (primary) hypertension: Secondary | ICD-10-CM | POA: Insufficient documentation

## 2011-09-13 LAB — DIFFERENTIAL
Lymphocytes Relative: 23 % (ref 12–46)
Lymphs Abs: 1.5 10*3/uL (ref 0.7–4.0)
Monocytes Relative: 8 % (ref 3–12)
Neutro Abs: 4.4 10*3/uL (ref 1.7–7.7)
Neutrophils Relative %: 66 % (ref 43–77)

## 2011-09-13 LAB — BASIC METABOLIC PANEL
BUN: 16 mg/dL (ref 6–23)
Chloride: 99 mEq/L (ref 96–112)
Glucose, Bld: 101 mg/dL — ABNORMAL HIGH (ref 70–99)
Potassium: 3.9 mEq/L (ref 3.5–5.1)
Sodium: 136 mEq/L (ref 135–145)

## 2011-09-13 LAB — CBC
Hemoglobin: 15 g/dL (ref 13.0–17.0)
RBC: 5.06 MIL/uL (ref 4.22–5.81)
WBC: 6.8 10*3/uL (ref 4.0–10.5)

## 2011-09-13 LAB — POCT I-STAT TROPONIN I

## 2011-09-13 MED ORDER — ACETAMINOPHEN 500 MG PO TABS
1000.0000 mg | ORAL_TABLET | Freq: Once | ORAL | Status: AC
  Administered 2011-09-13: 1000 mg via ORAL
  Filled 2011-09-13: qty 2

## 2011-09-13 MED ORDER — NITROGLYCERIN 0.4 MG SL SUBL
0.4000 mg | SUBLINGUAL_TABLET | Freq: Once | SUBLINGUAL | Status: AC
  Administered 2011-09-13: 0.4 mg via SUBLINGUAL
  Filled 2011-09-13: qty 25

## 2011-09-13 NOTE — ED Provider Notes (Signed)
History   This chart was scribed for Geoffery Lyons, MD by Clarita Crane. The patient was seen in room APA02/APA02 and the patient's care was started at 12:16PM.   CSN: [id number]  Arrival date & time 09/13/11  1116   First MD Initiated Contact with Patient 09/13/11 1214      Chief Complaint  Patient presents with  . Chest Pain    (Consider location/radiation/quality/duration/timing/severity/associated sxs/prior treatment) HPI Micheal Baker is a 66 y.o. male who presents to the Emergency Department complaining of intermittent mild to moderate left sided chest pain radiating to upper back between scapulae onset this morning and persistent since. Notes chest pain is not aggravated with deep breathing or palpation. Denies diaphoresis, nausea, vomiting, diarrhea, SOB. Patient reports he had a stress test performed several weeks ago with equivocal results and has a heart catheterization scheduled for tomorrow. Patient is a non-smoker and has h/o HTN and borderline HLD.  Past Medical History  Diagnosis Date  . Hypertension   . Thyroid disease     Past Surgical History  Procedure Date  . Retinal detachment surgery     left  . Cataract extraction     left  . Eye surgery     right  . Knee arthroscopy     left  . Groin surgery     growth removed     No family history on file.  History  Substance Use Topics  . Smoking status: Never Smoker   . Smokeless tobacco: Not on file  . Alcohol Use: Yes     occasionally      Review of Systems 10 Systems reviewed and are negative for acute change except as noted in the HPI.  Allergies  Penicillins  Home Medications   Current Outpatient Rx  Name Route Sig Dispense Refill  . ASPIRIN EC 81 MG PO TBEC Oral Take 81 mg by mouth daily.      . OMEGA-3 FATTY ACIDS 1000 MG PO CAPS Oral Take 2 g by mouth daily.     Marland Kitchen GLUCOSAMINE-CHONDROITIN 500-400 MG PO TABS Oral Take 1 tablet by mouth daily.      Marland Kitchen LEVOTHYROXINE SODIUM 50 MCG PO TABS Oral  Take 50 mcg by mouth daily.      Marland Kitchen METOPROLOL SUCCINATE ER 50 MG PO TB24 Oral Take 25 mg by mouth daily.      Marland Kitchen ROPINIROLE HCL 0.5 MG PO TABS Oral Take 0.5 mg by mouth at bedtime as needed. Restless leg       BP 152/83  Pulse 62  Resp 18  Ht 6\' 5"  (1.956 m)  Wt 200 lb (90.719 kg)  BMI 23.72 kg/m2  SpO2 99%  Physical Exam  Nursing note and vitals reviewed. Constitutional: He is oriented to person, place, and time. He appears well-developed and well-nourished. No distress.  HENT:  Head: Normocephalic and atraumatic.  Eyes: EOM are normal. Pupils are equal, round, and reactive to light.  Neck: Neck supple. No tracheal deviation present.  Cardiovascular: Normal rate and regular rhythm.  Exam reveals no gallop and no friction rub.   No murmur heard. Pulmonary/Chest: Effort normal. No respiratory distress. He has no wheezes. He has no rales. He exhibits no tenderness.  Abdominal: Soft. He exhibits no distension. There is no tenderness. There is no rebound and no guarding.  Musculoskeletal: Normal range of motion. He exhibits no edema.  Lymphadenopathy:    He has no cervical adenopathy.  Neurological: He is alert and oriented to person,  place, and time. No sensory deficit.  Skin: Skin is warm and dry.  Psychiatric: He has a normal mood and affect. His behavior is normal.    ED Course  Procedures (including critical care time)  DIAGNOSTIC STUDIES: Oxygen Saturation is 99% on room air, normal by my interpretation.    COORDINATION OF CARE:    Labs Reviewed  BASIC METABOLIC PANEL - Abnormal; Notable for the following:    Glucose, Bld 101 (*)    GFR calc non Af Amer 86 (*)    All other components within normal limits  CBC  DIFFERENTIAL  POCT I-STAT TROPONIN I  I-STAT TROPONIN I   No results found.   No diagnosis found.   Date: 09/13/2011  Rate: 56  Rhythm: sinus bradycardia  QRS Axis: normal  Intervals: normal  ST/T Wave abnormalities: normal  Conduction  Disutrbances:none  Narrative Interpretation:   Old EKG Reviewed: none available    MDM  Patient with history of equivocal stress test.  Labs and ekg today okay.  He has an appointment for a heart cath tomorrow.  Will recommend he keep this.  If pain returns, he is to either return or go to [facility].  He has ntg tabs and has been instructed on their use.      I personally performed the services described in this documentation, which was scribed in my presence. The recorded information has been reviewed and considered.     Geoffery Lyons, MD 09/13/11 [phone]

## 2011-09-13 NOTE — ED Notes (Signed)
Pt sitting up on the side of the bed, pt states that it makes his lower back feel better. Pt states that his chest pain is not as bad.

## 2011-09-13 NOTE — ED Notes (Signed)
Pt having cp off and on. Had stress test last week and scheduled for heart cath tomorrow. Pt started having cp again today with radiation to left back and sob.

## 2011-09-14 ENCOUNTER — Ambulatory Visit (HOSPITAL_COMMUNITY)
Admission: RE | Admit: 2011-09-14 | Discharge: 2011-09-14 | Disposition: A | Discharge status: Home / Self Care | Payer: BC Managed Care – PPO | Source: Ambulatory Visit | Attending: Internal Medicine | Admitting: Internal Medicine

## 2011-09-14 ENCOUNTER — Encounter (HOSPITAL_COMMUNITY): Payer: Self-pay | Admitting: Internal Medicine

## 2011-09-14 ENCOUNTER — Encounter (HOSPITAL_COMMUNITY)
Admission: RE | Disposition: A | Discharge status: Home / Self Care | Payer: Self-pay | Source: Ambulatory Visit | Attending: Internal Medicine

## 2011-09-14 DIAGNOSIS — R079 Chest pain, unspecified: Secondary | ICD-10-CM | POA: Insufficient documentation

## 2011-09-14 DIAGNOSIS — R9439 Abnormal result of other cardiovascular function study: Secondary | ICD-10-CM | POA: Insufficient documentation

## 2011-09-14 DIAGNOSIS — I10 Essential (primary) hypertension: Secondary | ICD-10-CM | POA: Insufficient documentation

## 2011-09-14 HISTORY — PX: LEFT HEART CATHETERIZATION WITH CORONARY ANGIOGRAM: SHX5451

## 2011-09-14 SURGERY — LEFT HEART CATHETERIZATION WITH CORONARY ANGIOGRAM
Anesthesia: LOCAL

## 2011-09-14 MED ORDER — VERAPAMIL HCL 2.5 MG/ML IV SOLN
INTRAVENOUS | Status: AC
  Filled 2011-09-14: qty 2

## 2011-09-14 MED ORDER — SODIUM CHLORIDE 0.9 % IV SOLN: INTRAVENOUS | Status: DC

## 2011-09-14 MED ORDER — MIDAZOLAM HCL 2 MG/2ML IJ SOLN
INTRAMUSCULAR | Status: AC
  Filled 2011-09-14: qty 2

## 2011-09-14 MED ORDER — HEPARIN SODIUM (PORCINE) 1000 UNIT/ML IJ SOLN
INTRAMUSCULAR | Status: AC
  Filled 2011-09-14: qty 1

## 2011-09-14 MED ORDER — FENTANYL CITRATE 0.05 MG/ML IJ SOLN
INTRAMUSCULAR | Status: AC
  Filled 2011-09-14: qty 2

## 2011-09-14 MED ORDER — SODIUM CHLORIDE 0.9 % IV SOLN
INTRAVENOUS | Status: DC
  Administered 2011-09-14: 08:00:00 via INTRAVENOUS

## 2011-09-14 MED ORDER — SODIUM CHLORIDE 0.9 % IV SOLN: 1.0000 mL/kg/h | INTRAVENOUS | Status: DC

## 2011-09-14 MED ORDER — NITROGLYCERIN 0.2 MG/ML ON CALL CATH LAB
INTRAVENOUS | Status: AC
  Filled 2011-09-14: qty 1

## 2011-09-14 MED ORDER — LIDOCAINE HCL (PF) 1 % IJ SOLN
INTRAMUSCULAR | Status: AC
  Filled 2011-09-14: qty 30

## 2011-09-14 MED ORDER — DIAZEPAM 5 MG PO TABS
5.0000 mg | ORAL_TABLET | ORAL | Status: AC
  Administered 2011-09-14: 5 mg via ORAL
  Filled 2011-09-14: qty 1

## 2011-09-14 MED ORDER — ACETAMINOPHEN 325 MG PO TABS: 650.0000 mg | ORAL_TABLET | ORAL | Status: DC | PRN

## 2011-09-14 MED ORDER — ONDANSETRON HCL 4 MG/2ML IJ SOLN: 4.0000 mg | Freq: Four times a day (QID) | INTRAMUSCULAR | Status: DC | PRN

## 2011-09-14 MED ORDER — HEPARIN (PORCINE) IN NACL 2-0.9 UNIT/ML-% IJ SOLN
INTRAMUSCULAR | Status: AC
  Filled 2011-09-14: qty 2000

## 2011-09-14 MED ORDER — ONDANSETRON HCL 4 MG/2ML IJ SOLN
INTRAMUSCULAR | Status: AC
  Filled 2011-09-14: qty 2

## 2011-09-14 NOTE — H&P (Signed)
[facility]          INTERVAL PROCEDURE H&P   History and Physical Interval Note:  09/14/2011 8:14 AM  Micheal Baker has presented today for their planned procedure. The various methods of treatment have been discussed with the patient and family. After consideration of risks, benefits and other options for treatment, the patient has consented to the procedure.  The patients' outpatient history has been reviewed, patient examined, and no change in status from most recent office note within the past 30 days. I have reviewed the patients' chart and labs and will proceed as planned. Questions were answered to the patient's satisfaction.   Micheal Nose, MD Attending Cardiologist [facility]  Micheal Baker 09/14/2011, 8:14 AM

## 2011-09-14 NOTE — Op Note (Addendum)
[facility]     CARDIAC CATHETERIZATION REPORT  Micheal Baker   [id number] 1946-01-27  Performing Cardiologist: [name] Primary Physician: Kirk Ruths, MD Primary Cardiologist:  Dr. Rennis Golden  Procedures Performed:  Left Heart Catheterization via 5 Fr right radial access  Native Coronary Angiography  Indication(s): Chest pain, abnormal nuclear stress test  History: 66 y.o. male with a history of hypertension, family history of coronary disease, and multiple knee surgeries in the past, who had presented with symptoms of left shoulder pain which seemed to be somewhat associated with exertion. He has been exercising with elliptical machine and the symptoms were somewhat worse. They last about 10-15 minutes including a sharp pain mostly on the left breast and sometimes into the left shoulder. He underwent stress testing which showed no normal LV function, however the electrical portion of the stress test demonstrated 3 mm horizontal ST depression in 2, 3, aVF and V4 through V6 with peak exercise. The perfusion images demonstrated a bowel artifact which appeared fixed and similar to previous imaging studies however there was significant artifact in the rest imaging reversible ischemia could not be fully excluded. He is therefore referred for definitive cardiac catheterization.  Consent: The procedure with Risks/Benefits/Alternatives and Indications was reviewed with the patient.  All questions were answered.    Risks / Complications include, but not limited to: Death, MI, CVA/TIA, VF/VT (with defibrillation), Bradycardia (need for temporary pacer placement), contrast induced nephropathy, bleeding / bruising / hematoma / pseudoaneurysm, vascular or coronary injury (with possible emergent CT or Vascular Surgery), adverse medication reactions, infection.    The patient voices understanding and agree to proceed.    Risks of procedure as well as the alternatives  and risks of each were explained to the (patient/caregiver).  Consent for procedure obtained. Consent for signed by MD and patient with RN witness -- placed on chart.  Procedure: The patient was brought to the 2nd Floor [facility] in the fasting state and prepped and draped in the usual sterile fashion for (Right radial) access. A modified Allen's test with plethysmography was performed on the right wrist demonstrating adequate Ulnar Artery collateral flow.    Sterile technique was used including antiseptics, cap, gloves, gown, hand hygiene, mask and sheet.  Skin prep: Chlorhexidine;  Time Out: Verified patient identification, verified procedure, site/side was marked, verified correct patient position, special equipment/implants available, medications/allergies/relevent history reviewed, required imaging and test results available.  Performed  The right wrist was anesthetized with 1% subcutaneous Lidocaine.  The right radial artery was accessed using the Seldinger Technique with placement of a 5 Fr Glide Sheath. The sheath was aspirated and flushed.  Then a total of 3 ml of standard Radial Artery Cocktail (see medications) was infused. A 5 Fr TIG 4.0 Catheter was advanced of over a Lexicographer J wire into the ascending Aorta.  The catheter was used to engage the left and right coronary artery, as well as to cross the aortic valve for LV pressure measurement.  Multiple cineangiographic views of the left and right coronary artery system(s) were performed.  LV hemodynamics were then re-sampled, and the catheter was pulled back across the Aortic Valve for measurement of "pull-back" gradient.  The catheter and the wire was removed completely out of the body.  The sheath was removed in the Cath Lab with a TR band placed at 12 ml Air at 10:15 am.  Reverse Allen's test revealed non-occlusive hemostasis.   The  patient was transported to the holding area in stable  condition.   The patient was noted to have a vagal reaction during the procedure.  He was given 0.75 mg atropine, 4 mg zofran and a 500 cc saline bolus during the procedure. HR was as low as the 30's and BP in the 70's systolic. This recovered fully by the end of the case. EBL: 10 cc  Medications:  Premedication: 5 mg  Valium  Sedation:  1 mg IV Versed, 25 IV mcg Fentanyl  Contrast:  50 Omnipaque  5000 units IV heparin, 0.75 mg atropine, 3 cc radial cocktail, 4 mg IV Zofran  Hemodynamics:  Central Aortic Pressure / Mean Aortic Pressure: 93/59  LV Pressure / LV End diastolic Pressure:  5  Coronary Angiographic Data:  No significant obstructive coronary disease.  Right dominant coronary system.  LVEDP = 5 mmHg.  Impression: 1.  No identifiable cardiac cause of the patient's left chest pain. 2.  False positive stress test.  Plan: 1.  Recommend outpatient orthopedic workup for possible cervicalgia or left shoulder impingement.  The case and results was discussed with the patient and family. The case and results was discussed with the patient's PCP. The case and results was discussed with the patient's Cardiologist.  Time Spend Directly with Patient:  45 minutes  [name], MD Attending Cardiologist [facility]  HILTY,Kenneth C 09/14/2011, 10:11 AM

## 2012-08-07 ENCOUNTER — Ambulatory Visit (HOSPITAL_COMMUNITY)
Admission: RE | Admit: 2012-08-07 | Discharge: 2012-08-07 | Disposition: A | Discharge status: Home / Self Care | Payer: Medicare Other | Source: Ambulatory Visit | Attending: Family Medicine | Admitting: Family Medicine

## 2012-08-07 ENCOUNTER — Other Ambulatory Visit (HOSPITAL_COMMUNITY): Payer: Self-pay | Admitting: Family Medicine

## 2012-08-07 DIAGNOSIS — J40 Bronchitis, not specified as acute or chronic: Secondary | ICD-10-CM | POA: Insufficient documentation

## 2012-08-07 DIAGNOSIS — J069 Acute upper respiratory infection, unspecified: Secondary | ICD-10-CM | POA: Insufficient documentation

## 2012-08-07 DIAGNOSIS — J209 Acute bronchitis, unspecified: Secondary | ICD-10-CM

## 2012-09-17 ENCOUNTER — Emergency Department (HOSPITAL_COMMUNITY)
Admission: EM | Admit: 2012-09-17 | Discharge: 2012-09-17 | Disposition: A | Discharge status: Home / Self Care | Payer: Medicare Other | Attending: Emergency Medicine | Admitting: Emergency Medicine

## 2012-09-17 ENCOUNTER — Emergency Department (HOSPITAL_COMMUNITY): Payer: Medicare Other

## 2012-09-17 ENCOUNTER — Encounter (HOSPITAL_COMMUNITY): Payer: Self-pay | Admitting: Emergency Medicine

## 2012-09-17 DIAGNOSIS — R0602 Shortness of breath: Secondary | ICD-10-CM | POA: Insufficient documentation

## 2012-09-17 DIAGNOSIS — Z79899 Other long term (current) drug therapy: Secondary | ICD-10-CM | POA: Insufficient documentation

## 2012-09-17 DIAGNOSIS — R079 Chest pain, unspecified: Secondary | ICD-10-CM | POA: Insufficient documentation

## 2012-09-17 DIAGNOSIS — E079 Disorder of thyroid, unspecified: Secondary | ICD-10-CM | POA: Insufficient documentation

## 2012-09-17 DIAGNOSIS — Z7982 Long term (current) use of aspirin: Secondary | ICD-10-CM | POA: Insufficient documentation

## 2012-09-17 DIAGNOSIS — I10 Essential (primary) hypertension: Secondary | ICD-10-CM | POA: Insufficient documentation

## 2012-09-17 LAB — POCT I-STAT TROPONIN I: Troponin i, poc: 0 ng/mL (ref 0.00–0.08)

## 2012-09-17 LAB — BASIC METABOLIC PANEL
BUN: 16 mg/dL (ref 6–23)
GFR calc Af Amer: >90 mL/min (ref >90)
GFR calc non Af Amer: >90 mL/min (ref >90)
Potassium: 3.7 mEq/L (ref 3.5–5.1)

## 2012-09-17 LAB — BASIC METABOLIC PANEL WITH GFR
CO2: 24 meq/L (ref 19–32)
Calcium: 9.8 mg/dL (ref 8.4–10.5)
Chloride: 100 meq/L (ref 96–112)
Creatinine, Ser: 0.81 mg/dL (ref 0.50–1.35)
Glucose, Bld: 102 mg/dL — ABNORMAL HIGH (ref 70–99)
Sodium: 138 meq/L (ref 135–145)

## 2012-09-17 LAB — D-DIMER, QUANTITATIVE: D-Dimer, Quant: <0.27 ug/mL-FEU (ref 0.00–0.48)

## 2012-09-17 LAB — CBC
Hemoglobin: 15.8 g/dL (ref 13.0–17.0)
Platelets: 248 10*3/uL (ref 150–400)
RBC: 5.19 MIL/uL (ref 4.22–5.81)
WBC: 9.9 10*3/uL (ref 4.0–10.5)

## 2012-09-17 MED ORDER — ALPRAZOLAM 0.5 MG PO TABS: 0.5000 mg | ORAL_TABLET | Freq: Every evening | ORAL | Status: DC | PRN

## 2012-09-17 NOTE — ED Notes (Signed)
Pt c/o midsternal CP and SOB with exertion x 5 days; pt labored at present; pt sts seen by PCP with no diagnosis yet

## 2012-09-17 NOTE — ED Notes (Signed)
Patient transported to X-ray 

## 2012-09-17 NOTE — ED Provider Notes (Signed)
History     CSN: [id number]  Arrival date & time 09/17/12  1121   First MD Initiated Contact with Patient 09/17/12 1141      Chief Complaint  Patient presents with  . Chest Pain  . Shortness of Breath    (Consider location/radiation/quality/duration/timing/severity/associated sxs/prior treatment) Patient is a 67 y.o. male presenting with chest pain and shortness of breath. The history is provided by the patient. No language interpreter was used.  Chest Pain The chest pain began more than 2 weeks ago. Chest pain occurs intermittently. The chest pain is improving. The pain is associated with breathing. The quality of the pain is described as heavy and pressure-like. The pain does not radiate. Chest pain is worsened by exertion. Primary symptoms include shortness of breath. Pertinent negatives for primary symptoms include no fever, no cough, no wheezing, no nausea and no vomiting.  Pertinent negatives for associated symptoms include no orthopnea.    Shortness of Breath  Associated symptoms include chest pain and shortness of breath. Pertinent negatives include no orthopnea, no fever, no cough and no wheezing.   67 year old now with complaint of chest pain with shortness of breath upon exertion intermittent since before Thanksgiving. States as of her recent last Thursday he got shorter rest were. Friday he went to his PCP in [place] has a normal EKG and chest x-ray. He is back today complaining of shortness of breath with exertion and felt winded when he was unloading the dishwasher today. Patient has had a recent cardiac workup with a negative cardiac cath. He has been to his PCP multiple times with no cause for your shortness of breath and chest pain. He did have a fall over a year ago and wondering if its from that. 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 The pain is on exertion shortness of breath is on exertion only. She  does not smoke. Has a history of hypertension and thyroid disease.  Past Medical History  Diagnosis Date  . Hypertension   . Thyroid disease     Past Surgical History  Procedure Date  . Retinal detachment surgery     left  . Cataract extraction     left  . Eye surgery     right  . Knee arthroscopy     left  . Groin surgery     growth removed     History reviewed. No pertinent family history.  History  Substance Use Topics  . Smoking status: Never Smoker   . Smokeless tobacco: Not on file  . Alcohol Use: Yes     Comment: occasionally      Review of Systems  Constitutional: Negative.  Negative for fever.  HENT: Negative.   Eyes: Negative.   Respiratory: Positive for shortness of breath. Negative for cough and wheezing.   Cardiovascular: Positive for chest pain. Negative for orthopnea.  Gastrointestinal: Negative.  Negative for nausea and vomiting.  Neurological: Negative.   Psychiatric/Behavioral: Negative.   All other systems reviewed and are negative.    Allergies  Penicillins  Home Medications   Current Outpatient Rx  Name  Route  Sig  Dispense  Refill  . ASPIRIN EC 81 MG PO TBEC   Oral   Take 81 mg by mouth daily.           . OMEGA-3 FATTY ACIDS 1000 MG PO CAPS   Oral   Take 2 g by mouth  daily.          Marland Kitchen GLUCOSAMINE-CHONDROITIN 500-400 MG PO TABS   Oral   Take 1 tablet by mouth daily.           Marland Kitchen LEVOTHYROXINE SODIUM 50 MCG PO TABS   Oral   Take 50 mcg by mouth daily.           Marland Kitchen METOPROLOL SUCCINATE ER 50 MG PO TB24   Oral   Take 25 mg by mouth daily.           Marland Kitchen ROPINIROLE HCL 0.5 MG PO TABS   Oral   Take 0.5 mg by mouth at bedtime as needed. Restless leg            BP 135/74  Pulse 72  Temp 98.2 F (36.8 C) (Oral)  Resp 20  SpO2 100%  Physical Exam  Nursing note and vitals reviewed. Constitutional: He is oriented to person, place, and time. He appears well-developed and well-nourished.  HENT:  Head:  Normocephalic.  Eyes: Conjunctivae normal and EOM are normal. Pupils are equal, round, and reactive to light.  Neck: Normal range of motion. Neck supple.  Cardiovascular: Normal rate.   Pulmonary/Chest: Effort normal.  Abdominal: Soft. Bowel sounds are normal. He exhibits no distension.  Musculoskeletal: Normal range of motion.  Neurological: He is alert and oriented to person, place, and time.  Skin: Skin is warm and dry.  Psychiatric: He has a normal mood and affect.    ED Course  Procedures (including critical care time) Discussed with Dr. Oletta Lamas.  Labs Reviewed  BASIC METABOLIC PANEL   No results found.   No diagnosis found.    MDM  67 year old male with recurrent chest pain shortness of breath on exertion. He has been worked up by cardiology had a recent negative cardiac cath. He has been worked up by his PCP with no findings for his shortness of breath. States that he was unloading the dishwasher this morning became short of breath and chest pain began. Patient has a negative d-dimer today and a negative troponin. O2 sats are 100% on room air. His vital signs are stable. No cause was found for his shortness of breath and chest pain that is intermittent with exertion. Will recommend a pulmonologist for his problem. Patient is thinking he has been exposed to mold where he works. Chest x-ray is unremarkable and reviewed by myself. He does not smoke.  Labs Reviewed  BASIC METABOLIC PANEL - Abnormal; Notable for the following:    Glucose, Bld 102 (*)     All other components within normal limits  D-DIMER, QUANTITATIVE  POCT I-STAT TROPONIN I  CBC         Remi Haggard, NP 09/17/12 1415  Remi Haggard, NP 09/18/12 1249

## 2012-09-17 NOTE — ED Notes (Signed)
Pt placed on O2 pt and pt states relief of sob with O2 placement.  Notified of neg lab results.

## 2012-09-19 NOTE — ED Provider Notes (Signed)
Medical screening examination/treatment/procedure(s) were performed by non-physician practitioner and as supervising physician I was immediately available for consultation/collaboration.   Gavin Pound. Leonardo Plaia, MD 09/19/12 1610

## 2012-10-03 ENCOUNTER — Ambulatory Visit (INDEPENDENT_AMBULATORY_CARE_PROVIDER_SITE_OTHER): Payer: Medicare Other | Admitting: Internal Medicine

## 2012-10-03 ENCOUNTER — Encounter: Payer: Self-pay | Admitting: Internal Medicine

## 2012-10-03 ENCOUNTER — Encounter: Payer: Self-pay | Admitting: *Deleted

## 2012-10-03 VITALS — BP 122/86 | HR 63 | Temp 97.4°F | Ht 77.0 in | Wt 200.0 lb

## 2012-10-03 DIAGNOSIS — R059 Cough, unspecified: Secondary | ICD-10-CM | POA: Insufficient documentation

## 2012-10-03 DIAGNOSIS — R05 Cough: Secondary | ICD-10-CM

## 2012-10-03 MED ORDER — PANTOPRAZOLE SODIUM 40 MG PO TBEC: 40.0000 mg | DELAYED_RELEASE_TABLET | Freq: Every day | ORAL | Status: DC

## 2012-10-03 MED ORDER — TRAMADOL HCL 50 MG PO TABS: ORAL_TABLET | ORAL | Status: DC

## 2012-10-03 MED ORDER — FAMOTIDINE 20 MG PO TABS: ORAL_TABLET | ORAL | Status: DC

## 2012-10-03 NOTE — Progress Notes (Signed)
  Subjective:    Patient ID: Micheal Baker, male    DOB: [birth year]  MRN: [id number]  HPI  66 yowm never smoked never respiratory problems  X sinus issues since around 2009 then onset of variable cough and sob since June 2013 and referred by [facility] for pulmonary evaluation for chronic cough   10/03/2012 1st pulmonary eval cc 3-4 sinus problems per year x 4-5 years neg ent eval then June 2013 similar problem with mild ear and head congestion then raspy voice and then hacking cough rx shot of prednisone rx as "pna" and 100% better then before Tgiving noticed ear congestion and short of breath and cough same rx but noted fatigue with exercise no cough then week  Before xmas developed cough chest tightness not with the ear congestion and 100% better then w/in 30 min of returning to work same symptoms and so hasn't worked since Jan 14th and much worse with nebulization and better for last few days but still feels a bit tight and congested in chest with minimal white mucus pro duction.   Sleeping ok without nocturnal  or early am exacerbation  of respiratory  c/o's or need for noct saba. Also denies any obvious fluctuation of symptoms with weather or environmental changes or other aggravating or alleviating factors except as outlined above     Review of Systems  Constitutional: Negative for fever, chills, activity change, appetite change and unexpected weight change.  HENT: Positive for ear pain and sore throat. Negative for congestion, rhinorrhea, sneezing, trouble swallowing, dental problem, voice change and postnasal drip.   Eyes: Negative for visual disturbance.  Respiratory: Positive for cough and shortness of breath. Negative for choking.   Cardiovascular: Positive for chest pain. Negative for leg swelling.  Gastrointestinal: Negative for nausea, vomiting and abdominal pain.  Genitourinary: Negative for difficulty urinating.  Musculoskeletal: Negative for arthralgias.  Skin: Negative for rash.    Neurological: Positive for headaches.  Psychiatric/Behavioral: Negative for behavioral problems and confusion.       Objective:   Physical Exam Rel thin amb wm nad with freq throat clearing Wt Readings from Last 3 Encounters:  10/03/12 200 lb (90.719 kg)  09/14/11 200 lb (90.719 kg)  09/14/11 200 lb (90.719 kg)    HEENT: nl dentition, turbinates, and orophanx. Nl external ear canals without cough reflex   NECK :  without JVD/Nodes/TM/ nl carotid upstrokes bilaterally   LUNGS: no acc muscle use, clear to A and P bilaterally without cough on insp or exp maneuvers   CV:  RRR  no s3 or murmur or increase in P2, no edema   ABD:  soft and nontender with nl excursion in the supine position. No bruits or organomegaly, bowel sounds nl  MS:  warm without deformities, calf tenderness, cyanosis or clubbing  SKIN: warm and dry without lesions    NEURO:  alert, approp, no deficits    cxr 09/17/12 No acute cardiopulmonary abnormality seen.      Assessment & Plan:

## 2012-10-03 NOTE — Assessment & Plan Note (Signed)
Symptoms are markedly disproportionate to objective findings and not clear this is even a lung problem (the nl [id number] with active symptoms strongly rules against asthma) but pt does appear to have difficult airway management issues.   The most common causes of chronic cough in immunocompetent adults include the following: upper airway cough syndrome (UACS), previously referred to as postnasal drip syndrome (PNDS), which is caused by variety of rhinosinus conditions; (2) asthma; (3) GERD; (4) chronic bronchitis from cigarette smoking or other inhaled environmental irritants; (5) nonasthmatic eosinophilic bronchitis; and (6) bronchiectasis.   These conditions, singly or in combination, have accounted for up to 94% of the causes of chronic cough in prospective studies.   Other conditions have constituted no >6% of the causes in prospective studies These have included bronchogenic carcinoma, chronic interstitial pneumonia, sarcoidosis, left ventricular failure, ACEI-induced cough, and aspiration from a condition associated with pharyngeal dysfunction.    Chronic cough is often simultaneously caused by more than one condition. A single cause has been found from 38 to 82% of the time, multiple causes from 18 to 62%. Multiply caused cough has been the result of three diseases up to 42% of the time.      Of the three most common causes of chronic cough, only one (GERD)  can actually cause the other two (asthma and post nasal drip syndrome)  and perpetuate the cylce of cough inducing airway trauma, inflammation, heightened sensitivity to reflux which is prompted by the cough itself via a cyclical mechanism.    This may partially respond to steroids and look like asthma and post nasal drainage but never erradicated completely unless the cough and the secondary reflux are eliminated, preferably both at the same time.  While not intuitively obvious, many patients with chronic low grade reflux do not cough until  there is a secondary insult that disturbs the protective epithelial barrier and exposes sensitive nerve endings.  This can be viral or direct physical injury such as with an endotracheal tube.   The point is that once this occurs, it is difficult to eliminate using anything but a maximally effective acid suppression regimen at least in the short run, accompanied by an appropriate diet to address non acid GERD.   For now try eliminate cyclical cough then regroup    Each maintenance medication was reviewed in detail including most importantly the difference between maintenance and as needed and under what circumstances the prns are to be used.  Please see instructions for details which were reviewed in writing and the patient given a copy.

## 2012-10-03 NOTE — Patient Instructions (Addendum)
The key to effective treatment for your cough is eliminating the non-stop cycle of cough you're stuck in long enough to let your airway heal completely and then see if there is anything still making you cough once you stop the cough suppression, but this should take no more than 5 days to figuure out  First take delsym two tsp every 12 hours and supplement if needed with  tramadol 50 mg up to 2 every 4 hours to suppress the urge to cough. Swallowing water or using ice chips/non mint and menthol containing candies (such as lifesavers or sugarless jolly ranchers) are also effective.  You should rest your voice and avoid activities that you know make you cough.  Once you have eliminated the cough for 3 straight days try reducing the tramadol first,  then the delsym as tolerated.    Try pantoprazole 40 mg   Take 30-60 min before first meal of the day and Pepcid 20 mg one bedtime until cough is completely gone for at least a week without the need for cough suppression   GERD (REFLUX)  is an extremely common cause of respiratory symptoms, many times with no significant heartburn at all.    It can be treated with medication, but also with lifestyle changes including avoidance of late meals, excessive alcohol, smoking cessation, and avoid fatty foods, chocolate, peppermint, colas, red wine, and acidic juices such as orange juice.  NO MINT OR MENTHOL PRODUCTS SO NO COUGH DROPS  USE SUGARLESS CANDY INSTEAD (jolley ranchers or Stover's)  NO OIL BASED VITAMINS - use powdered substitutes.    Prednisone taper off   Please schedule a follow up office visit in 2 weeks, sooner if needed

## 2012-10-05 ENCOUNTER — Telehealth: Payer: Self-pay | Admitting: Internal Medicine

## 2012-10-05 NOTE — Telephone Encounter (Signed)
I spoke with pt. He stated WM advised him he could work in [employer] or branch in a different building. His letter states he can't return to work until 10/17/12 he is needing a letter fax stating he can work in another [employer]. This needs to be faxed to [phone] attn: michael roach. Please advise MW thanks

## 2012-10-05 NOTE — Telephone Encounter (Signed)
Ok with me 

## 2012-10-05 NOTE — Telephone Encounter (Signed)
LMTCB I want some clarification before I do his letter

## 2012-10-08 ENCOUNTER — Encounter: Payer: Self-pay | Admitting: *Deleted

## 2012-10-08 NOTE — Telephone Encounter (Signed)
Pt states that he needs letter stating that he is ok to return to work the week of 10-08-12 in a different building/ branch.  Pt notified that letter was faxed per his request to Attn: [place] to [phone]

## 2012-10-08 NOTE — Telephone Encounter (Signed)
Pt returned call. Cell [phone]. Micheal Baker

## 2012-10-08 NOTE — Telephone Encounter (Signed)
Patient calling back about work note.  He states he is unable to work until the letter is faxed.  Office # [phone]

## 2012-10-17 ENCOUNTER — Other Ambulatory Visit (INDEPENDENT_AMBULATORY_CARE_PROVIDER_SITE_OTHER): Payer: Medicare Other

## 2012-10-17 ENCOUNTER — Encounter: Payer: Self-pay | Admitting: Internal Medicine

## 2012-10-17 ENCOUNTER — Encounter: Payer: Self-pay | Admitting: *Deleted

## 2012-10-17 ENCOUNTER — Ambulatory Visit (INDEPENDENT_AMBULATORY_CARE_PROVIDER_SITE_OTHER): Payer: Medicare Other | Admitting: Internal Medicine

## 2012-10-17 VITALS — BP 110/72 | HR 62 | Temp 98.6°F | Ht 77.0 in | Wt 197.6 lb

## 2012-10-17 DIAGNOSIS — R05 Cough: Secondary | ICD-10-CM

## 2012-10-17 DIAGNOSIS — R059 Cough, unspecified: Secondary | ICD-10-CM

## 2012-10-17 LAB — CBC WITH DIFFERENTIAL/PLATELET
Basophils Relative: 0.3 % (ref 0.0–3.0)
Eosinophils Relative: 3 % (ref 0.0–5.0)
HCT: 44.5 % (ref 39.0–52.0)
Hemoglobin: 15.4 g/dL (ref 13.0–17.0)
Lymphs Abs: 1 10*3/uL (ref 0.7–4.0)
MCV: 87.3 fl (ref 78.0–100.0)
Monocytes Absolute: 0.6 10*3/uL (ref 0.1–1.0)
Monocytes Relative: 9.5 % (ref 3.0–12.0)
Platelets: 233 10*3/uL (ref 150.0–400.0)
RBC: 5.1 Mil/uL (ref 4.22–5.81)
WBC: 6.7 10*3/uL (ref 4.5–10.5)

## 2012-10-17 NOTE — Progress Notes (Signed)
Subjective:    Patient ID: Micheal Baker, male    DOB: 1946/01/08  MRN: [id number]   Brief patient profile:  38 yowm never smoked never respiratory problems  X sinus issues since around 2009 then onset of variable cough and sob since June 2013 and referred by [facility] for pulmonary evaluation for chronic cough   10/03/2012 1st pulmonary eval cc 3-4 sinus problems per year x 4-5 years neg ent eval then June 2013 similar problem with mild ear and head congestion then raspy voice and then hacking cough rx shot of prednisone rx as "pna" and 100% better then before Tgiving noticed ear congestion and short of breath and cough same rx but noted fatigue with exercise no cough then week  Before xmas developed cough chest tightness not with the ear congestion and 100% better then w/in 30 min of returning to work same symptoms and so hasn't worked since Jan 14th and much worse with nebulization and better for last few days but still feels a bit tight and congested in chest with minimal white mucus pro duction. rec First take delsym two tsp every 12 hours and supplement if needed with  tramadol 50 mg up to 2 every 4 hours to suppress the urge to cough.   Try pantoprazole 40 mg   Take 30-60 min before first meal of the day and Pepcid 20 mg one bedtime until cough is completely gone for at least a week without the need for cough suppression GERD diet . Prednisone tapered off on 1/31.    10/17/2012 f/u ov/Jasn Xia cc cough much better and not taking any cough meds,  mainly happens p eats.  Otherwise No obvious daytime variabilty or assoc sob or cp or chest tightness, subjective wheeze overt sinus or hb symptoms. No unusual exp hx or h/o childhood pna/ asthma or premature birth to his knowledge.   Sleeping ok without nocturnal  or early am exacerbation  of respiratory  c/o's or need for noct saba. Also denies any obvious fluctuation of symptoms with weather or environmental changes or other aggravating or alleviating  factors except as outlined above   ROS  The following are not active complaints unless bolded sore throat, dysphagia, dental problems, itching, sneezing,  nasal congestion or excess/ purulent secretions, ear ache,   fever, chills, sweats, unintended wt loss, pleuritic or exertional cp, hemoptysis,  orthopnea pnd or leg swelling, presyncope, palpitations, heartburn, abdominal pain, anorexia, nausea, vomiting, diarrhea  or change in bowel or urinary habits, change in stools or urine, dysuria,hematuria,  rash, arthralgias, visual complaints, headache, numbness weakness or ataxia or problems with walking or coordination,  change in mood/affect or memory.              Objective:   Physical Exam  Rel thin amb wm nad with occ  throat clearing 10/17/2012 197    10/03/12 200 lb (90.719 kg)  09/14/11 200 lb (90.719 kg)  09/14/11 200 lb (90.719 kg)    HEENT: nl dentition, turbinates, and orophanx. Nl external ear canals without cough reflex   NECK :  without JVD/Nodes/TM/ nl carotid upstrokes bilaterally   LUNGS: no acc muscle use, clear to A and P bilaterally without cough on insp or exp maneuvers   CV:  RRR  no s3 or murmur or increase in P2, no edema   ABD:  soft and nontender with nl excursion in the supine position. No bruits or organomegaly, bowel sounds nl  MS:  warm without deformities, calf tenderness, cyanosis  or clubbing  SKIN: warm and dry without lesions    NEURO:  alert, approp, no deficits    cxr 09/17/12 No acute cardiopulmonary abnormality seen.      Assessment & Plan:

## 2012-10-17 NOTE — Patient Instructions (Addendum)
Please remember to go to the lab department downstairs for your tests - we will call you with the results when they are available.  Continue pepcid and singulair at bedtime.  Please schedule a follow up office visit in 4 weeks, sooner if needed - call [phone] ask Almyra Free to schedule sinus CT if not better on return.

## 2012-10-18 ENCOUNTER — Encounter: Payer: Self-pay | Admitting: Internal Medicine

## 2012-10-18 LAB — ALLERGY PROFILE REGION II-DC, DE, MD, ~~LOC~~, VA
Allergen, D pternoyssinus,d7: <0.1 kU/L
Bermuda Grass: <0.1 kU/L
Box Elder IgE: <0.1 kU/L
Cladosporium Herbarum: <0.1 kU/L
Cockroach: <0.1 kU/L
Common Ragweed: <0.1 kU/L
D. farinae: <0.1 kU/L
Dog Dander: <0.1 kU/L
Lamb's Quarters: <0.1 kU/L
Meadow Grass: <0.1 kU/L
Oak: <0.1 kU/L
Pecan/Hickory Tree IgE: <0.1 kU/L

## 2012-10-18 NOTE — Assessment & Plan Note (Signed)
-   PFT's 10/03/2012 including fef 25-75 more than 12 hours since last saba with mild persistent chest tightness  Most likely this is  Classic Upper airway cough syndrome, so named because it's frequently impossible to sort out how much is  CR/sinusitis with freq throat clearing (which can be related to primary GERD)   vs  causing  secondary (" extra esophageal")  GERD from wide swings in gastric pressure that occur with throat clearing, often  promoting self use of mint and menthol lozenges that reduce the lower esophageal sphincter tone and exacerbate the problem further in a cyclical fashion.   These are the same pts (now being labeled as having "irritable larynx syndrome" by some cough centers) who not infrequently have a history of having failed to tolerate ace inhibitors,  dry powder inhalers or biphosphonates or report having atypical reflux symptoms that don't respond to standard doses of PPI , and are easily confused as having aecopd or asthma flares by even experienced allergists/ pulmonologists.   Throat clearing better, not clear whether there is allergic component to pnds so allergy profile sent  Should be ok to go back to work now

## 2012-11-14 ENCOUNTER — Ambulatory Visit (INDEPENDENT_AMBULATORY_CARE_PROVIDER_SITE_OTHER): Payer: Medicare Other | Admitting: Internal Medicine

## 2012-11-14 ENCOUNTER — Encounter: Payer: Self-pay | Admitting: Internal Medicine

## 2012-11-14 VITALS — BP 110/80 | HR 60 | Temp 98.0°F | Ht 77.0 in | Wt 203.0 lb

## 2012-11-14 DIAGNOSIS — R05 Cough: Secondary | ICD-10-CM

## 2012-11-14 NOTE — Progress Notes (Signed)
Subjective:    Patient ID: Micheal Baker, male    DOB: 16-Aug-1946  MRN: [id number]   Brief patient profile:  76 yowm never smoked never respiratory problems  X sinus issues since around 2009 then onset of variable cough and sob since June 2013 and referred by [facility] for pulmonary evaluation for chronic cough   10/03/2012 1st pulmonary eval cc 3-4 sinus problems per year x 4-5 years neg ent eval then June 2013 similar problem with mild ear and head congestion then raspy voice and then hacking cough rx shot of prednisone rx as "pna" and 100% better then before Tgiving noticed ear congestion and short of breath and cough same rx but noted fatigue with exercise no cough then week  Before xmas developed cough chest tightness not with the ear congestion and 100% better then w/in 30 min of returning to work same symptoms and so hasn't worked since Jan 14th and much worse with nebulization and better for last few days but still feels a bit tight and congested in chest with minimal white mucus pro duction. rec First take delsym two tsp every 12 hours and supplement if needed with  tramadol 50 mg up to 2 every 4 hours to suppress the urge to cough.   Try pantoprazole 40 mg   Take 30-60 min before first meal of the day and Pepcid 20 mg one bedtime until cough is completely gone for at least a week without the need for cough suppression GERD diet . Prednisone tapered off on 1/31.    10/17/2012 f/u ov/Cline Draheim cc cough much better and not taking any cough meds,  mainly happens p eats.  rec Allergy testing > neg. Continue pepcid and singulair at bedtime.   11/14/2012 f/u ov/Vianey Caniglia cc 90% better cough on combination of ppi ac and pepcd 20mg  and singulair at hs and did fine until exposed for 2 hours > 50 % worse, improved back to 90% p avoided exposure>  Still on singulair but not gerd rx x 2 weeks and maintaining back at 90%.  Otherwise No obvious daytime variabilty or assoc sob or cp or chest tightness, subjective  wheeze overt sinus or hb symptoms. No unusual exp hx or h/o childhood pna/ asthma or premature birth to his knowledge.   Sleeping ok without nocturnal  or early am exacerbation  of respiratory  c/o's or need for noct saba. Also denies any obvious fluctuation of symptoms with weather or environmental changes or other aggravating or alleviating factors except as outlined above   ROS  The following are not active complaints unless bolded sore throat, dysphagia, dental problems, itching, sneezing,  nasal congestion or excess/ purulent secretions, ear ache,   fever, chills, sweats, unintended wt loss, pleuritic or exertional cp, hemoptysis,  orthopnea pnd or leg swelling, presyncope, palpitations, heartburn, abdominal pain, anorexia, nausea, vomiting, diarrhea  or change in bowel or urinary habits, change in stools or urine, dysuria,hematuria,  rash, arthralgias, visual complaints, headache, numbness weakness or ataxia or problems with walking or coordination,  change in mood/affect or memory.              Objective:   Physical Exam  Rel thin amb wm nad with rare throat clearing 10/17/2012 197 > 11/14/2012  203    10/03/12 200 lb (90.719 kg)  09/14/11 200 lb (90.719 kg)  09/14/11 200 lb (90.719 kg)    HEENT: nl dentition, turbinates, and orophanx. Nl external ear canals without cough reflex   NECK :  without  JVD/Nodes/TM/ nl carotid upstrokes bilaterally   LUNGS: no acc muscle use, clear to A and P bilaterally without cough on insp or exp maneuvers   CV:  RRR  no s3 or murmur or increase in P2, no edema   ABD:  soft and nontender with nl excursion in the supine position. No bruits or organomegaly, bowel sounds nl  MS:  warm without deformities, calf tenderness, cyanosis or clubbing  SKIN: warm and dry without lesions    NEURO:  alert, approp, no deficits    cxr 09/17/12 No acute cardiopulmonary abnormality seen.      Assessment & Plan:

## 2012-11-14 NOTE — Patient Instructions (Addendum)
Avoidance is the best way to treat to your cough  If you would like an allergy evaluation I would recommend to [place] but in the meantime for any flare of symptoms > Try prilosec 20mg   Take 30-60 min before first meal of the day and Pepcid 20 mg one bedtime until cough is completely gone for at least a week without the need for cough suppression  I think of reflux for chronic cough like I do oxygen for fire (doesn't cause the fire but once you get the oxygen suppressed it usually goes away regardless of the exact cause).   Follow up here as needed

## 2012-11-15 NOTE — Assessment & Plan Note (Addendum)
-   PFT's 10/03/2012 wnl including fef 25-75 more than 12 hours since last saba with mild persistent chest tightness - Allergy profile 10/17/12 > Eos 3% / IgE 51 and no sign aeroallergen  Although not able to label this is cough variant asthma, esp since he reports worsening with nebulizer rx, he clearly has environmentally triggered upper airway cough that is better on singulair but not really responsive to GERD Rx.  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   These are the same pts (now being labeled as having "irritable larynx syndrome" by some cough centers) who not infrequently have a history of having failed to tolerate ace inhibitors,  dry powder inhalers or biphosphonates or report having atypical reflux symptoms that don't respond to standard doses of PPI , and are easily confused as having aecopd or asthma flares by even experienced allergists/ pulmonologists.    For now, avoidance is the key and refer to [facility] or [facility] next step if symptoms recur.

## 2013-10-03 ENCOUNTER — Ambulatory Visit (HOSPITAL_COMMUNITY)
Admission: RE | Admit: 2013-10-03 | Discharge: 2013-10-03 | Disposition: A | Discharge status: Home / Self Care | Payer: Medicare Other | Source: Ambulatory Visit | Attending: Family Medicine | Admitting: Family Medicine

## 2013-10-03 ENCOUNTER — Other Ambulatory Visit (HOSPITAL_COMMUNITY): Payer: Self-pay | Admitting: Family Medicine

## 2013-10-03 DIAGNOSIS — J45909 Unspecified asthma, uncomplicated: Secondary | ICD-10-CM | POA: Insufficient documentation

## 2014-04-24 ENCOUNTER — Other Ambulatory Visit (HOSPITAL_COMMUNITY): Payer: Self-pay | Admitting: Orthopedic Surgery

## 2014-04-24 DIAGNOSIS — M25562 Pain in left knee: Secondary | ICD-10-CM

## 2014-05-16 ENCOUNTER — Ambulatory Visit (INDEPENDENT_AMBULATORY_CARE_PROVIDER_SITE_OTHER): Payer: Medicare Other | Admitting: Cardiovascular Disease

## 2014-05-16 ENCOUNTER — Encounter: Payer: Self-pay | Admitting: Cardiovascular Disease

## 2014-05-16 VITALS — BP 124/60 | HR 41 | Ht 77.0 in | Wt 200.0 lb

## 2014-05-16 DIAGNOSIS — I10 Essential (primary) hypertension: Secondary | ICD-10-CM

## 2014-05-16 DIAGNOSIS — I493 Ventricular premature depolarization: Secondary | ICD-10-CM

## 2014-05-16 DIAGNOSIS — R5383 Other fatigue: Secondary | ICD-10-CM

## 2014-05-16 DIAGNOSIS — I4949 Other premature depolarization: Secondary | ICD-10-CM

## 2014-05-16 DIAGNOSIS — Z01818 Encounter for other preprocedural examination: Secondary | ICD-10-CM

## 2014-05-16 DIAGNOSIS — R5381 Other malaise: Secondary | ICD-10-CM

## 2014-05-16 DIAGNOSIS — R001 Bradycardia, unspecified: Secondary | ICD-10-CM

## 2014-05-16 DIAGNOSIS — I498 Other specified cardiac arrhythmias: Secondary | ICD-10-CM

## 2014-05-16 NOTE — Progress Notes (Signed)
Patient ID: Micheal Baker, male   DOB: 1946-06-12, 68 y.o.   MRN: [id number]      SUBJECTIVE: The patient is a 68 year old male with a history of hypothyroidism and hypertension who I am evaluating for the first time today. He underwent coronary angiography in January 2013 which demonstrated normal epicardial coronary arteries. At that time he had been describing chest pain which was deemed atypical. He had been scheduled to undergo left knee arthroscopy two days ago but the procedure was cancelled due to "low heart rate, abnormal ECG, and fatigue". The patient has experienced intermittent fatigue over the past 2 months. He goes to the gym and tries to exercise regularly but on some days he feels too tired to do so. He does cardiovascular training and lifts weights. He denies palpitations. He may experience mild dizziness approximately once a month. He denies orthopnea, leg swelling and paroxysmal nocturnal dyspnea. He said his thyroid blood tests were checked approximately 2 months ago and was told they were normal. 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  ECG performed on 05/14/2014 demonstrated normal sinus rhythm with frequent PVCs, HR 67 bpm.  Review of Systems: As per "subjective", otherwise negative.  Allergies  Allergen Reactions  . Penicillins     chills    Current Outpatient Prescriptions  Medication Sig Dispense Refill  . levothyroxine (SYNTHROID, LEVOTHROID) 50 MCG tablet Take 50 mcg by mouth daily.        . meloxicam (MOBIC) 15 MG tablet Take 15 mg by mouth daily.      . metoprolol (TOPROL-XL) 50 MG 24 hr tablet Take 25 mg by mouth daily.         No current facility-administered medications for this visit.    Past Medical History  Diagnosis Date  . Hypertension   . Thyroid disease     Past Surgical History  Procedure Laterality Date  . Retinal detachment surgery      left  . Cataract extraction      left  . Eye surgery      right  . Knee  arthroscopy      left  . Groin surgery      growth removed     History   Social History  . Marital Status: Divorced    Spouse Name: N/A    Number of Children: N/A  . Years of Education: N/A   Occupational History  . Part time Library work    Social History Main Topics  . Smoking status: Never Smoker   . Smokeless tobacco: Never Used  . Alcohol Use: Yes     Comment: occasionally  . Drug Use: No  . Sexual Activity: Not on file   Other Topics Concern  . Not on file   Social History Narrative  . No narrative on file     Filed Vitals:   05/16/14 1348  BP: 124/60  Pulse: 41  Height: 6\' 5"  (1.956 m)  Weight: 200 lb (90.719 kg)   HR by auscultation: 68 bpm  PHYSICAL EXAM General: NAD HEENT: Normal. Neck: No JVD, no thyromegaly. Lungs: Clear to auscultation bilaterally with normal respiratory effort. CV: Nondisplaced PMI.  Irregular rhythm with frequent premature contractions, normal S1/S2, no S3/S4, no murmur. No pretibial or periankle edema.  No carotid bruit.  Normal pedal pulses.  Abdomen: Soft, nontender, no hepatosplenomegaly, no distention.  Neurologic: Alert and oriented x 3.  Psych: Normal affect. Skin: Normal. Musculoskeletal: Normal range of motion,  no gross deformities. Extremities: No clubbing or cyanosis.   ECG: Most recent ECG reviewed.      ASSESSMENT AND PLAN: 1. Fatigue with frequent PVC's: This may be a consequence of frequent symptomatic PVCs. He is already on Toprol-XL 25 mg daily. I will obtain a 48 hour Holter monitor to assess his PVC burden. I will also obtain an echocardiogram to assess his left ventricular systolic function, primarily to see if frequent PVCs having any way compromised his LV function. I will also obtain a basic metabolic panel and serum magnesium level to evaluate for readily correctable electrolyte abnormalities. I will also obtain a copy of his most frequent thyroid blood testing results.  2. Bradycardia: As I  explained to the patient, I do not believe he was actually bradycardic. Pulse oximeters tend not to be able to detect PVCs and thus the calculated heart rate is low. This happened in the office today as well. However, by auscultation, his heart rate is not 41 beats per minute but is in fact 68 beats per minute.  3. Preoperative clearance: I will await further cardiac testing before quantifying his perioperative risk.  4. Essential HTN: Controlled on present therapy.  Dispo: f/u 3 weeks.  Time spent: 40 minutes, of which greater than 50% was spent explaining the potential effects of frequent PVC's.  Kate Sable, M.D., F.A.C.C.

## 2014-05-16 NOTE — Patient Instructions (Signed)
Your physician recommends that you schedule a follow-up appointment in: 3-4 weeks   Your physician has requested that you have an echocardiogram. Echocardiography is a painless test that uses sound waves to create images of your heart. It provides your doctor with information about the size and shape of your heart and how well your heart's chambers and valves are working. This procedure takes approximately one hour. There are no restrictions for this procedure.    Your physician has recommended that you wear a holter monitor for 48 hrs. Holter monitors are medical devices that record the heart's electrical activity. Doctors most often use these monitors to diagnose arrhythmias. Arrhythmias are problems with the speed or rhythm of the heartbeat. The monitor is a small, portable device. You can wear one while you do your normal daily activities. This is usually used to diagnose what is causing palpitations/syncope (passing out).      Thank you for choosing [facility] !

## 2014-05-17 LAB — BASIC METABOLIC PANEL
BUN: 14 mg/dL (ref 6–23)
CHLORIDE: 102 meq/L (ref 96–112)
CO2: 30 meq/L (ref 19–32)
CREATININE: 0.95 mg/dL (ref 0.50–1.35)
Calcium: 9.8 mg/dL (ref 8.4–10.5)
Glucose, Bld: 89 mg/dL (ref 70–99)
POTASSIUM: 4.3 meq/L (ref 3.5–5.3)
SODIUM: 140 meq/L (ref 135–145)

## 2014-05-17 LAB — MAGNESIUM: Magnesium: 2 mg/dL (ref 1.5–2.5)

## 2014-05-20 ENCOUNTER — Ambulatory Visit (HOSPITAL_COMMUNITY)
Admission: RE | Admit: 2014-05-20 | Discharge: 2014-05-20 | Disposition: A | Discharge status: Home / Self Care | Payer: Medicare Other | Source: Ambulatory Visit | Attending: Cardiovascular Disease | Admitting: Cardiovascular Disease

## 2014-05-20 ENCOUNTER — Encounter: Payer: Self-pay | Admitting: Cardiovascular Disease

## 2014-05-20 DIAGNOSIS — E079 Disorder of thyroid, unspecified: Secondary | ICD-10-CM | POA: Insufficient documentation

## 2014-05-20 DIAGNOSIS — R079 Chest pain, unspecified: Secondary | ICD-10-CM | POA: Insufficient documentation

## 2014-05-20 DIAGNOSIS — I4949 Other premature depolarization: Secondary | ICD-10-CM | POA: Insufficient documentation

## 2014-05-20 DIAGNOSIS — I493 Ventricular premature depolarization: Secondary | ICD-10-CM

## 2014-05-20 DIAGNOSIS — R5383 Other fatigue: Secondary | ICD-10-CM

## 2014-05-20 DIAGNOSIS — I517 Cardiomegaly: Secondary | ICD-10-CM | POA: Insufficient documentation

## 2014-05-20 DIAGNOSIS — I10 Essential (primary) hypertension: Secondary | ICD-10-CM | POA: Insufficient documentation

## 2014-05-20 DIAGNOSIS — I472 Ventricular tachycardia: Secondary | ICD-10-CM

## 2014-05-20 DIAGNOSIS — I4729 Other ventricular tachycardia: Secondary | ICD-10-CM

## 2014-05-20 NOTE — Progress Notes (Signed)
48 hour Holter monitor in progress.

## 2014-05-20 NOTE — Progress Notes (Signed)
  Echocardiogram 2D Echocardiogram has been performed.  [place], [place] 05/20/2014, 2:28 PM

## 2014-05-21 ENCOUNTER — Telehealth: Payer: Self-pay | Admitting: *Deleted

## 2014-05-21 NOTE — Telephone Encounter (Signed)
Message copied by Desma Mcgregor on Wed May 21, 2014  9:18 AM ------      Message from: Kate Sable A      Created: Wed May 21, 2014  9:10 AM       Good results. ------

## 2014-05-21 NOTE — Telephone Encounter (Signed)
Pt notified of results, forwarded to pcp

## 2014-05-27 ENCOUNTER — Encounter: Payer: Self-pay | Admitting: Cardiovascular Disease

## 2014-05-28 ENCOUNTER — Other Ambulatory Visit (HOSPITAL_COMMUNITY): Payer: Medicare Other

## 2014-05-28 ENCOUNTER — Telehealth: Payer: Self-pay | Admitting: *Deleted

## 2014-05-28 ENCOUNTER — Encounter (HOSPITAL_COMMUNITY): Payer: Medicare Other

## 2014-05-28 NOTE — Telephone Encounter (Signed)
Pt notified of results, understood and will f/u on 9/29 in [place] with Dr. Bronson Ing.

## 2014-05-28 NOTE — Telephone Encounter (Signed)
Message copied by Desma Mcgregor on Wed May 28, 2014 11:57 AM ------      Message from: Kate Sable A      Created: Wed May 28, 2014  9:48 AM       Frequent PVC's with short runs of NSVT. Symptoms of chest pain correlated with normal sinus rhythm. K, Mg, and thyroid levels were normal. LV systolic function is also normal. He is at low risk for a major adverse cardiac event for his planned surgery and can safely proceed. I will see him at his f/u ov and decide on further management. ------

## 2014-06-10 ENCOUNTER — Ambulatory Visit (INDEPENDENT_AMBULATORY_CARE_PROVIDER_SITE_OTHER): Payer: Medicare Other | Admitting: Cardiovascular Disease

## 2014-06-10 ENCOUNTER — Encounter: Payer: Self-pay | Admitting: Cardiovascular Disease

## 2014-06-10 VITALS — BP 125/80 | HR 52 | Ht 77.0 in | Wt 206.5 lb

## 2014-06-10 DIAGNOSIS — I472 Ventricular tachycardia: Secondary | ICD-10-CM

## 2014-06-10 DIAGNOSIS — I4949 Other premature depolarization: Secondary | ICD-10-CM

## 2014-06-10 DIAGNOSIS — I10 Essential (primary) hypertension: Secondary | ICD-10-CM

## 2014-06-10 DIAGNOSIS — I498 Other specified cardiac arrhythmias: Secondary | ICD-10-CM

## 2014-06-10 DIAGNOSIS — R001 Bradycardia, unspecified: Secondary | ICD-10-CM | POA: Insufficient documentation

## 2014-06-10 DIAGNOSIS — I493 Ventricular premature depolarization: Secondary | ICD-10-CM | POA: Insufficient documentation

## 2014-06-10 DIAGNOSIS — Z01818 Encounter for other preprocedural examination: Secondary | ICD-10-CM

## 2014-06-10 DIAGNOSIS — R5381 Other malaise: Secondary | ICD-10-CM

## 2014-06-10 DIAGNOSIS — R5383 Other fatigue: Secondary | ICD-10-CM

## 2014-06-10 DIAGNOSIS — I4729 Other ventricular tachycardia: Secondary | ICD-10-CM

## 2014-06-10 NOTE — Patient Instructions (Signed)
Your physician recommends that you schedule a follow-up appointment in: 3-4 months. Your physician recommends that you continue on your current medications as directed. Please refer to the Current Medication list given to you today.

## 2014-06-10 NOTE — Progress Notes (Signed)
Patient ID: Micheal Baker, male   DOB: 07-23-1946, 68 y.o.   MRN: [id number]      SUBJECTIVE: The patient presents for followup for frequent PVCs and fatigue. Basic metabolic panel and serum magnesium levels were normal. TSH was normal on 01/09/2014. Echocardiogram demonstrated normal left ventricular systolic function, EF 33-82%, mild LVH and mild diastolic dysfunction. Holter monitoring demonstrated normal sinus rhythm with frequent PVCs and short runs of nonsustained ventricular tachycardia. The patient's symptoms correlated with normal sinus rhythm.  He feels well and denies chest pain, shortness of breath and fatigue. He has been exercising by lifting weights and using the elliptical machine without difficulties.  Review of Systems: As per "subjective", otherwise negative.  Allergies  Allergen Reactions  . Penicillins     chills    Current Outpatient Prescriptions  Medication Sig Dispense Refill  . acetaminophen (TYLENOL) 325 MG tablet Take 650 mg by mouth as needed.      Marland Kitchen levothyroxine (SYNTHROID, LEVOTHROID) 50 MCG tablet Take 50 mcg by mouth daily.        . meloxicam (MOBIC) 15 MG tablet Take 15 mg by mouth daily.      . metoprolol (TOPROL-XL) 50 MG 24 hr tablet Take 25 mg by mouth daily.         No current facility-administered medications for this visit.    Past Medical History  Diagnosis Date  . Hypertension   . Thyroid disease     Past Surgical History  Procedure Laterality Date  . Retinal detachment surgery      left  . Cataract extraction      left  . Eye surgery      right  . Knee arthroscopy      left  . Groin surgery      growth removed     History   Social History  . Marital Status: Divorced    Spouse Name: N/A    Number of Children: N/A  . Years of Education: N/A   Occupational History  . Part time Library work    Social History Main Topics  . Smoking status: Never Smoker   . Smokeless tobacco: Never Used     Comment: 06/10/14 1968-  Tried it once and didn't like it- AJ  . Alcohol Use: Yes     Comment: occasionally  . Drug Use: No  . Sexual Activity: Not on file   Other Topics Concern  . Not on file   Social History Narrative  . No narrative on file     Filed Vitals:   06/10/14 1413  BP: 125/80  Pulse: 52  Height: 6\' 5"  (1.956 m)  Weight: 206 lb 8 oz (93.668 kg)  SpO2: 98%    PHYSICAL EXAM General: NAD HEENT: Normal. Neck: No JVD, no thyromegaly. Lungs: Clear to auscultation bilaterally with normal respiratory effort. CV: Nondisplaced PMI.  Regular rate and rhythm, normal S1/S2, no S3/S4, no murmur. No pretibial or periankle edema.  No carotid bruit.  Normal pedal pulses.  Abdomen: Soft, nontender, no hepatosplenomegaly, no distention.  Neurologic: Alert and oriented x 3.  Psych: Normal affect. Skin: Normal. Musculoskeletal: Normal range of motion, no gross deformities. Extremities: No clubbing or cyanosis.   ECG: Most recent ECG reviewed.      ASSESSMENT AND PLAN: 1. Fatigue with frequent PVC's and short runs of NSVT: This may be a consequence of frequent symptomatic PVCs. He is already on Toprol-XL 25 mg daily. Testing results as noted above. Will continue present  management. Would consider increasing Toprol-XL in the future if need be. At present, he is symptomatically stable. 2. Bradycardia: As I explained to the patient, I do not believe he was actually bradycardic. Pulse oximeters tend not to be able to detect PVCs and thus the calculated heart rate is low.  3. Preoperative clearance: Low perioperative risk for a major adverse cardiac event. Can safely proceed given normal LV systolic function. Continue metoprolol. 4. Essential HTN: Controlled on present therapy.   Dispo: f/u 3 months.   Kate Sable, M.D., F.A.C.C.

## 2014-06-23 ENCOUNTER — Encounter (HOSPITAL_BASED_OUTPATIENT_CLINIC_OR_DEPARTMENT_OTHER): Payer: Self-pay | Admitting: *Deleted

## 2014-06-23 DIAGNOSIS — I4729 Other ventricular tachycardia: Secondary | ICD-10-CM

## 2014-06-23 DIAGNOSIS — S83242A Other tear of medial meniscus, current injury, left knee, initial encounter: Secondary | ICD-10-CM | POA: Diagnosis present

## 2014-06-23 DIAGNOSIS — I472 Ventricular tachycardia: Secondary | ICD-10-CM

## 2014-06-23 NOTE — Progress Notes (Signed)
Saw cardiology for clearance due to pac-ekg and labs done 05/16/14-cleared-echo done Just getting over sinus infection May need istat if anesth wants

## 2014-06-23 NOTE — H&P (Signed)
Micheal Baker is an 68 y.o. male.   Chief Complaint: left knee pain HPI: Micheal Baker is a 68 year old seen for follow-up for his bilateral knee pain. We injected both knees almost five months ago which only helped for 2-3 weeks and the pain recurred. Pain with weightbearing and activity relieved by rest. He had a left knee arthroscopy in 1988. We previously injected his knees 2 years ago. He has swelling pain locking catching and popping. I spoke to Micheal Baker concerning his left knee MRI that revealed medial meniscus tear with mild chondromalacia  Past Medical History  Diagnosis Date  . Hypertension   . Thyroid disease   . Hypothyroidism   . Depression   . Frequent PVCs   . NSVT (nonsustained ventricular tachycardia)   . Acute medial meniscus tear of left knee     Past Surgical History  Procedure Laterality Date  . Retinal detachment surgery      left  . Cataract extraction      left  . Knee arthroscopy      left  . Groin surgery  2010    growth removed   . Eye surgery      left-scar tissue    Family History  Problem Relation Age of Onset  . Asthma Daughter    Social History:  reports that he has never smoked. He has never used smokeless tobacco. He reports that he drinks alcohol. He reports that he does not use illicit drugs.  Allergies:  Allergies  Allergen Reactions  . Penicillins     chills  No current facility-administered medications for this encounter. Current outpatient prescriptions:escitalopram (LEXAPRO) 10 MG tablet, Take 10 mg by mouth at bedtime., Disp: , Rfl: ;  levofloxacin (LEVAQUIN) 500 MG tablet, Take 500 mg by mouth daily. Daily for 7 days sinus infection started 06/18/14 to 06/25/14, Disp: , Rfl: ;  acetaminophen (TYLENOL) 325 MG tablet, Take 650 mg by mouth as needed., Disp: , Rfl: ;  levothyroxine (SYNTHROID, LEVOTHROID) 50 MCG tablet, Take 50 mcg by mouth daily.  , Disp: , Rfl:  meloxicam (MOBIC) 15 MG tablet, Take 15 mg by mouth daily., Disp: , Rfl: ;   metoprolol (TOPROL-XL) 50 MG 24 hr tablet, Take 25 mg by mouth daily.  , Disp: , Rfl:   No prescriptions prior to admission    No results found for this or any previous visit (from the past 48 hour(s)). No results found.  Review of Systems  Constitutional: Negative.   HENT: Negative.   Eyes: Negative.   Respiratory: Negative.   Cardiovascular: Negative.   Gastrointestinal: Negative.   Genitourinary: Negative.   Musculoskeletal: Positive for joint pain.       Left knee pain  Skin: Negative.   Neurological: Negative.   Endo/Heme/Allergies: Negative.   Psychiatric/Behavioral: Negative.     Height 6\' 5"  (1.956 m), weight 93.441 kg (206 lb). Physical Exam  Constitutional: He appears well-developed and well-nourished.  HENT:  Head: Normocephalic and atraumatic.  Eyes: Conjunctivae and EOM are normal. Pupils are equal, round, and reactive to light.  Neck: Normal range of motion. Neck supple.  Cardiovascular: Normal rate, regular rhythm and normal heart sounds.   Respiratory: Effort normal.  GI: Soft.  Genitourinary:  Not pertinent to current symptomatology therefore not examined.  Musculoskeletal:  Examination of both knees reveals 1+ crepitation 1+ synovitis pain on the medial joint line positive medial McMurray's full range of motion both knees are stable with normal patella tracking. Vascular  exam: pulses 2+ and symmetric.   Neurological: He is alert.     Assessment Principal Problem:   Acute medial meniscus tear of left knee Active Problems:   Benign hypertension   Hypothyroidism   Frequent PVCs   NSVT (nonsustained ventricular tachycardia)  Plan I told him with this finding and significant persistent pain I recommend we proceed with a left knee arthroscopy with attention to his meniscal and chondral pathology. Discussed risks benefits and possible complications of the surgery in detail and he understands this completely.   Sebastiano Luecke J 06/23/2014, 3:57  PM

## 2014-06-23 NOTE — Progress Notes (Signed)
06/23/14 1239  OBSTRUCTIVE SLEEP APNEA  Have you ever been diagnosed with sleep apnea through a sleep study? No  Do you snore loudly (loud enough to be heard through closed doors)?  1  Do you often feel tired, fatigued, or sleepy during the daytime? 0  Has anyone observed you stop breathing during your sleep? 0  Do you have, or are you being treated for high blood pressure? 1  BMI more than 35 kg/m2? 0  Age over 67 years old? 1  Neck circumference greater than 40 cm/16 inches? 0  Gender: 1  Obstructive Sleep Apnea Score 4  Score 4 or greater  Results sent to PCP

## 2014-06-27 ENCOUNTER — Ambulatory Visit (HOSPITAL_BASED_OUTPATIENT_CLINIC_OR_DEPARTMENT_OTHER)
Admission: RE | Admit: 2014-06-27 | Discharge: 2014-06-27 | Disposition: A | Discharge status: Home / Self Care | Payer: Medicare Other | Source: Ambulatory Visit | Attending: Orthopedic Surgery | Admitting: Orthopedic Surgery

## 2014-06-27 ENCOUNTER — Encounter (HOSPITAL_BASED_OUTPATIENT_CLINIC_OR_DEPARTMENT_OTHER): Payer: Medicare Other | Admitting: Anesthesiology

## 2014-06-27 ENCOUNTER — Encounter (HOSPITAL_BASED_OUTPATIENT_CLINIC_OR_DEPARTMENT_OTHER)
Admission: RE | Disposition: A | Discharge status: Home / Self Care | Payer: Self-pay | Source: Ambulatory Visit | Attending: Orthopedic Surgery

## 2014-06-27 ENCOUNTER — Ambulatory Visit (HOSPITAL_BASED_OUTPATIENT_CLINIC_OR_DEPARTMENT_OTHER): Payer: Medicare Other | Admitting: Anesthesiology

## 2014-06-27 ENCOUNTER — Encounter (HOSPITAL_BASED_OUTPATIENT_CLINIC_OR_DEPARTMENT_OTHER): Payer: Self-pay | Admitting: *Deleted

## 2014-06-27 DIAGNOSIS — I472 Ventricular tachycardia: Secondary | ICD-10-CM | POA: Diagnosis not present

## 2014-06-27 DIAGNOSIS — F329 Major depressive disorder, single episode, unspecified: Secondary | ICD-10-CM | POA: Insufficient documentation

## 2014-06-27 DIAGNOSIS — I10 Essential (primary) hypertension: Secondary | ICD-10-CM | POA: Diagnosis present

## 2014-06-27 DIAGNOSIS — I4729 Other ventricular tachycardia: Secondary | ICD-10-CM

## 2014-06-27 DIAGNOSIS — I493 Ventricular premature depolarization: Secondary | ICD-10-CM | POA: Diagnosis present

## 2014-06-27 DIAGNOSIS — R001 Bradycardia, unspecified: Secondary | ICD-10-CM | POA: Diagnosis not present

## 2014-06-27 DIAGNOSIS — M94262 Chondromalacia, left knee: Secondary | ICD-10-CM | POA: Insufficient documentation

## 2014-06-27 DIAGNOSIS — Y939 Activity, unspecified: Secondary | ICD-10-CM | POA: Insufficient documentation

## 2014-06-27 DIAGNOSIS — S83282A Other tear of lateral meniscus, current injury, left knee, initial encounter: Secondary | ICD-10-CM | POA: Diagnosis present

## 2014-06-27 DIAGNOSIS — E039 Hypothyroidism, unspecified: Secondary | ICD-10-CM | POA: Diagnosis not present

## 2014-06-27 DIAGNOSIS — Z880 Allergy status to penicillin: Secondary | ICD-10-CM | POA: Diagnosis not present

## 2014-06-27 DIAGNOSIS — M65862 Other synovitis and tenosynovitis, left lower leg: Secondary | ICD-10-CM | POA: Insufficient documentation

## 2014-06-27 DIAGNOSIS — S83282D Other tear of lateral meniscus, current injury, left knee, subsequent encounter: Secondary | ICD-10-CM

## 2014-06-27 DIAGNOSIS — S83242D Other tear of medial meniscus, current injury, left knee, subsequent encounter: Secondary | ICD-10-CM

## 2014-06-27 DIAGNOSIS — S83242A Other tear of medial meniscus, current injury, left knee, initial encounter: Secondary | ICD-10-CM | POA: Diagnosis present

## 2014-06-27 HISTORY — DX: Major depressive disorder, single episode, unspecified: F32.9

## 2014-06-27 HISTORY — DX: Depression, unspecified: F32.A

## 2014-06-27 HISTORY — DX: Ventricular premature depolarization: I49.3

## 2014-06-27 HISTORY — DX: Other ventricular tachycardia: I47.29

## 2014-06-27 HISTORY — DX: Other tear of medial meniscus, current injury, left knee, initial encounter: S83.242A

## 2014-06-27 HISTORY — PX: KNEE ARTHROSCOPY WITH MEDIAL MENISECTOMY: SHX5651

## 2014-06-27 HISTORY — DX: Hypothyroidism, unspecified: E03.9

## 2014-06-27 HISTORY — DX: Ventricular tachycardia: I47.2

## 2014-06-27 LAB — POCT I-STAT, CHEM 8
BUN: 19 mg/dL (ref 6–23)
CALCIUM ION: 1.19 mmol/L (ref 1.13–1.30)
Chloride: 104 mEq/L (ref 96–112)
Creatinine, Ser: 1 mg/dL (ref 0.50–1.35)
Glucose, Bld: 99 mg/dL (ref 70–99)
HEMATOCRIT: 46 % (ref 39.0–52.0)
HEMOGLOBIN: 15.6 g/dL (ref 13.0–17.0)
POTASSIUM: 3.7 meq/L (ref 3.7–5.3)
Sodium: 141 mEq/L (ref 137–147)
TCO2: 23 mmol/L (ref 0–100)

## 2014-06-27 SURGERY — ARTHROSCOPY, KNEE, WITH MEDIAL MENISCECTOMY
Anesthesia: General | Site: Knee | Laterality: Left

## 2014-06-27 MED ORDER — GLYCOPYRROLATE 0.2 MG/ML IJ SOLN
INTRAMUSCULAR | Status: DC | PRN
  Administered 2014-06-27: 0.4 mg via INTRAVENOUS

## 2014-06-27 MED ORDER — LIDOCAINE HCL (CARDIAC) 20 MG/ML IV SOLN
INTRAVENOUS | Status: DC | PRN
  Administered 2014-06-27: 50 mg via INTRAVENOUS

## 2014-06-27 MED ORDER — HYDROCODONE-ACETAMINOPHEN 5-325 MG PO TABS
1.0000 | ORAL_TABLET | Freq: Four times a day (QID) | ORAL | Status: DC | PRN
Start: 2014-06-27 — End: 2015-01-30

## 2014-06-27 MED ORDER — FENTANYL CITRATE 0.05 MG/ML IJ SOLN
INTRAMUSCULAR | Status: AC
  Filled 2014-06-27: qty 2

## 2014-06-27 MED ORDER — PROPOFOL 10 MG/ML IV BOLUS
INTRAVENOUS | Status: DC | PRN
  Administered 2014-06-27: 200 mg via INTRAVENOUS

## 2014-06-27 MED ORDER — CLINDAMYCIN PHOSPHATE 900 MG/50ML IV SOLN
900.0000 mg | INTRAVENOUS | Status: AC
  Administered 2014-06-27: 900 mg via INTRAVENOUS

## 2014-06-27 MED ORDER — EPINEPHRINE HCL 1 MG/ML IJ SOLN
INTRAMUSCULAR | Status: AC
Start: 2014-06-27 — End: 2014-06-27
  Filled 2014-06-27: qty 1

## 2014-06-27 MED ORDER — LACTATED RINGERS IV SOLN
INTRAVENOUS | Status: DC
  Administered 2014-06-27 (×2): via INTRAVENOUS

## 2014-06-27 MED ORDER — CHLORHEXIDINE GLUCONATE 4 % EX LIQD: 60.0000 mL | Freq: Once | CUTANEOUS | Status: DC

## 2014-06-27 MED ORDER — MIDAZOLAM HCL 2 MG/2ML IJ SOLN
INTRAMUSCULAR | Status: AC
  Filled 2014-06-27: qty 2

## 2014-06-27 MED ORDER — FENTANYL CITRATE 0.05 MG/ML IJ SOLN
50.0000 ug | INTRAMUSCULAR | Status: DC | PRN
  Administered 2014-06-27: 100 ug via INTRAVENOUS

## 2014-06-27 MED ORDER — HYDROMORPHONE HCL 1 MG/ML IJ SOLN
0.2500 mg | INTRAMUSCULAR | Status: DC | PRN
  Administered 2014-06-27 (×2): 0.5 mg via INTRAVENOUS

## 2014-06-27 MED ORDER — ONDANSETRON HCL 4 MG/2ML IJ SOLN
INTRAMUSCULAR | Status: DC | PRN
  Administered 2014-06-27: 4 mg via INTRAVENOUS

## 2014-06-27 MED ORDER — SODIUM CHLORIDE 0.9 % IR SOLN
Status: DC | PRN
  Administered 2014-06-27: 3000 mL

## 2014-06-27 MED ORDER — OXYCODONE HCL 5 MG/5ML PO SOLN: 5.0000 mg | Freq: Once | ORAL | Status: AC | PRN

## 2014-06-27 MED ORDER — OXYCODONE HCL 5 MG PO TABS
ORAL_TABLET | ORAL | Status: AC
  Filled 2014-06-27: qty 1

## 2014-06-27 MED ORDER — FENTANYL CITRATE 0.05 MG/ML IJ SOLN
INTRAMUSCULAR | Status: AC
  Filled 2014-06-27: qty 4

## 2014-06-27 MED ORDER — CLINDAMYCIN PHOSPHATE 900 MG/50ML IV SOLN
INTRAVENOUS | Status: AC
  Filled 2014-06-27: qty 50

## 2014-06-27 MED ORDER — DEXAMETHASONE SODIUM PHOSPHATE 4 MG/ML IJ SOLN
INTRAMUSCULAR | Status: DC | PRN
  Administered 2014-06-27: 8 mg via INTRAVENOUS

## 2014-06-27 MED ORDER — EPINEPHRINE HCL 1 MG/ML IJ SOLN
INTRAMUSCULAR | Status: DC | PRN
  Administered 2014-06-27: 1 mg

## 2014-06-27 MED ORDER — OXYCODONE HCL 5 MG PO TABS
5.0000 mg | ORAL_TABLET | Freq: Once | ORAL | Status: AC | PRN
  Administered 2014-06-27: 5 mg via ORAL

## 2014-06-27 MED ORDER — ONDANSETRON HCL 4 MG/2ML IJ SOLN: 4.0000 mg | Freq: Once | INTRAMUSCULAR | Status: DC | PRN

## 2014-06-27 MED ORDER — HYDROMORPHONE HCL 1 MG/ML IJ SOLN
INTRAMUSCULAR | Status: AC
  Filled 2014-06-27: qty 1

## 2014-06-27 MED ORDER — MIDAZOLAM HCL 2 MG/2ML IJ SOLN
1.0000 mg | INTRAMUSCULAR | Status: DC | PRN
  Administered 2014-06-27: 2 mg via INTRAVENOUS

## 2014-06-27 SURGICAL SUPPLY — 45 items
BANDAGE ELASTIC 6 VELCRO ST LF (GAUZE/BANDAGES/DRESSINGS) ×3 IMPLANT
BLADE CUTTER GATOR 3.5 (BLADE) ×3 IMPLANT
BLADE GREAT WHITE 4.2 (BLADE) ×2 IMPLANT
BLADE GREAT WHITE 4.2MM (BLADE) ×1
BLADE SURG 11 STRL SS (BLADE) ×3 IMPLANT
BLADE SURG 15 STRL LF DISP TIS (BLADE) IMPLANT
BLADE SURG 15 STRL SS (BLADE)
BNDG COHESIVE 4X5 TAN STRL (GAUZE/BANDAGES/DRESSINGS) IMPLANT
CANISTER SUCT 3000ML (MISCELLANEOUS) IMPLANT
DRAPE ARTHROSCOPY W/POUCH 90 (DRAPES) ×3 IMPLANT
DURAPREP 26ML APPLICATOR (WOUND CARE) ×3 IMPLANT
GAUZE SPONGE 4X4 12PLY STRL (GAUZE/BANDAGES/DRESSINGS) ×3 IMPLANT
GAUZE XEROFORM 1X8 LF (GAUZE/BANDAGES/DRESSINGS) ×3 IMPLANT
GLOVE BIO SURGEON STRL SZ7 (GLOVE) ×3 IMPLANT
GLOVE BIOGEL PI IND STRL 7.0 (GLOVE) ×2 IMPLANT
GLOVE BIOGEL PI IND STRL 7.5 (GLOVE) ×1 IMPLANT
GLOVE BIOGEL PI INDICATOR 7.0 (GLOVE) ×4
GLOVE BIOGEL PI INDICATOR 7.5 (GLOVE) ×2
GLOVE ECLIPSE 6.5 STRL STRAW (GLOVE) ×3 IMPLANT
GLOVE EXAM NITRILE LRG STRL (GLOVE) ×3 IMPLANT
GLOVE SS BIOGEL STRL SZ 7.5 (GLOVE) ×1 IMPLANT
GLOVE SUPERSENSE BIOGEL SZ 7.5 (GLOVE) ×2
GOWN STRL REUS W/ TWL LRG LVL3 (GOWN DISPOSABLE) ×3 IMPLANT
GOWN STRL REUS W/TWL LRG LVL3 (GOWN DISPOSABLE) ×6
HOLDER KNEE FOAM BLUE (MISCELLANEOUS) ×3 IMPLANT
KNEE WRAP E Z 3 GEL PACK (MISCELLANEOUS) ×3 IMPLANT
MANIFOLD NEPTUNE II (INSTRUMENTS) ×3 IMPLANT
NDL SAFETY ECLIPSE 18X1.5 (NEEDLE) ×1 IMPLANT
NEEDLE HYPO 18GX1.5 SHARP (NEEDLE) ×2
NEEDLE HYPO 22GX1.5 SAFETY (NEEDLE) IMPLANT
PACK ARTHROSCOPY DSU (CUSTOM PROCEDURE TRAY) ×3 IMPLANT
PACK BASIN DAY SURGERY FS (CUSTOM PROCEDURE TRAY) ×3 IMPLANT
PAD ALCOHOL SWAB (MISCELLANEOUS) ×6 IMPLANT
SET ARTHROSCOPY TUBING (MISCELLANEOUS) ×2
SET ARTHROSCOPY TUBING LN (MISCELLANEOUS) ×1 IMPLANT
SUCTION FRAZIER TIP 10 FR DISP (SUCTIONS) IMPLANT
SUT ETHILON 4 0 PS 2 18 (SUTURE) ×3 IMPLANT
SUT PROLENE 3 0 PS 2 (SUTURE) IMPLANT
SUT VIC AB 3-0 PS1 18 (SUTURE)
SUT VIC AB 3-0 PS1 18XBRD (SUTURE) IMPLANT
SYR 20CC LL (SYRINGE) IMPLANT
SYR 5ML LL (SYRINGE) ×3 IMPLANT
TOWEL OR 17X24 6PK STRL BLUE (TOWEL DISPOSABLE) ×3 IMPLANT
WAND STAR VAC 90 (SURGICAL WAND) IMPLANT
WATER STERILE IRR 1000ML POUR (IV SOLUTION) ×3 IMPLANT

## 2014-06-27 NOTE — Anesthesia Preprocedure Evaluation (Signed)
Anesthesia Evaluation  Patient identified by MRN, date of birth, ID band Patient awake    Reviewed: Allergy & Precautions, H&P , NPO status , Patient's Chart, lab work & pertinent test results, reviewed documented beta blocker date and time   Airway Mallampati: I TM Distance: >3 FB Neck ROM: Full    Dental  (+) Teeth Intact, Dental Advisory Given   Pulmonary  breath sounds clear to auscultation        Cardiovascular hypertension, Pt. on medications and Pt. on home beta blockers Rhythm:Regular Rate:Normal     Neuro/Psych    GI/Hepatic   Endo/Other    Renal/GU      Musculoskeletal   Abdominal   Peds  Hematology   Anesthesia Other Findings   Reproductive/Obstetrics                           Anesthesia Physical Anesthesia Plan  ASA: II  Anesthesia Plan: General   Post-op Pain Management:    Induction: Intravenous  Airway Management Planned: LMA  Additional Equipment:   Intra-op Plan:   Post-operative Plan: Extubation in OR  Informed Consent: I have reviewed the patients History and Physical, chart, labs and discussed the procedure including the risks, benefits and alternatives for the proposed anesthesia with the patient or authorized representative who has indicated his/her understanding and acceptance.   Dental advisory given  Plan Discussed with: CRNA, Anesthesiologist and Surgeon  Anesthesia Plan Comments:         Anesthesia Quick Evaluation

## 2014-06-27 NOTE — Op Note (Signed)
NAMEMATTHEO, SWINDLE                  ACCOUNT NO.:  [id number]  MEDICAL RECORD NO.:  [id number]  LOCATION:                                 FACILITY:  PHYSICIAN:  Carla Rashad A. Noemi Chapel, M.D. DATE OF BIRTH:  May 21, 1946  DATE OF PROCEDURE:  06/27/2014 DATE OF DISCHARGE:  06/27/2014                              OPERATIVE REPORT   PREOPERATIVE DIAGNOSES: 1. Left knee acute medial and lateral meniscal tears-traumatic. 2. 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 3. 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  POSTOPERATIVE DIAGNOSES: 1. Left knee acute medial and lateral meniscal tears-traumatic. 2. 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 3. 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  PROCEDURE:  Left knee examination under anesthesia, followed by arthroscopic partial medial and lateral meniscectomies with chondroplasty.  SURGEON:  Audree Camel. Noemi Chapel, M.D.  ASSISTANT:  Matthew Saras, PA-C  ANESTHESIA:  General.  OPERATIVE TIME:  30 minutes.  COMPLICATIONS:  None.  INDICATION FOR PROCEDURE:  Mr. Trella is a 68 year old gentleman, who has had 3 months of significant left knee pain with a twisting injury.  Exam and MRI has revealed medial and lateral meniscal tears with chondromalacia.  He has failed conservative care and is now to undergo arthroscopy.  DESCRIPTION OF PROCEDURE:  Mr. Lucus was brought to the operating room on June 27, 2014, after knee block was placed on holding room by Anesthesia.  He was placed on the operative table in supine position. He received antibiotics preoperatively for prophylaxis.  After being placed under general anesthesia, his left knee was examined.  He had full range of motion.  Knee was stable.  Ligamentous exam with normal patellar tracking.  Left leg was prepped using sterile DuraPrep and draped using sterile technique.  A time-out procedure was  called and the correct left knee identified.  Initially, through an anterolateral portal, the arthroscope with a pump attached was placed into an anteromedial portal and arthroscopic probe was placed.  On initial inspection of medial compartment, the articular cartilage showed 30-40% grade 3 chondromalacia, medial femoral condyle, medial tibial plateau which was debrided.  He had a crystalline deposits in the articular cartilage consistent with gout as well.  Medial meniscus showed tearing of the posterior and medial horn of which 50% was resected back to a stable rim.  Intercondylar notch inspected.  Anterior and posterior cruciate ligaments were normal.  Lateral compartment inspected.  He had 30-40% grade 3 chondromalacia as well.  Lateral femoral condyle and lateral tibial plateau which was debrided.  Lateral meniscus showed tearing of the posterior and lateral horn of which 30% was resected back to a stable rim.  Crystalline deposits noted in the articular cartilage and the meniscus as well.  Patellofemoral joint showed 25% grade 3 chondromalacia, which was debrided.  The patella tracked normally. Moderate synovitis of medial and lateral gutters were debrided, otherwise this was free of Pathology.  After this was done, it was felt that all pathology had been satisfactorily addressed.  The instruments removed.  Portals were closed with 3-0 nylon suture.  Sterile dressings were applied.  The  patient awakened and taken to recovery room in stable condition.  FOLLOWUP CARE:  Mr. Kolbeck will be followed as an outpatient on Norco for pain.  Seen back in office in a week for sutures out and followup.     Paisely Brick A. Noemi Chapel, M.D.     RAW/MEDQ  D:  06/27/2014  T:  06/27/2014  Job:  [id number]

## 2014-06-27 NOTE — Progress Notes (Signed)
Assisted Dr. Crews with left, knee block. Side rails up, monitors on throughout procedure. See vital signs in flow sheet. Tolerated Procedure well. 

## 2014-06-27 NOTE — Anesthesia Postprocedure Evaluation (Signed)
  Anesthesia Post-op Note  Patient: Micheal Baker  Procedure(s) Performed: Procedure(s): LEFT KNEE ARTHROSCOPY WITH PARTIAL MEDIAL AND LATERAL MENISECTOMIES AND CHONDROPLASTY (Left)  Patient Location: PACU  Anesthesia Type: General   Level of Consciousness: awake, alert  and oriented  Airway and Oxygen Therapy: Patient Spontanous Breathing  Post-op Pain: moderate  Post-op Assessment: Post-op Vital signs reviewed  Post-op Vital Signs: Reviewed  Last Vitals:  Filed Vitals:   06/27/14 0845  BP: 131/76  Pulse: 66  Temp:   Resp: 12    Complications: No apparent anesthesia complications

## 2014-06-27 NOTE — Discharge Instructions (Signed)
Regional Anesthesia Blocks ° °1. Numbness or the inability to move the "blocked" extremity may last from 3-48 hours after placement. The length of time depends on the medication injected and your individual response to the medication. If the numbness is not going away after 48 hours, call your surgeon. ° °2. The extremity that is blocked will need to be protected until the numbness is gone and the  Strength has returned. Because you cannot feel it, you will need to take extra care to avoid injury. Because it may be weak, you may have difficulty moving it or using it. You may not know what position it is in without looking at it while the block is in effect. ° °3. For blocks in the legs and feet, returning to weight bearing and walking needs to be done carefully. You will need to wait until the numbness is entirely gone and the strength has returned. You should be able to move your leg and foot normally before you try and bear weight or walk. You will need someone to be with you when you first try to ensure you do not fall and possibly risk injury. ° °4. Bruising and tenderness at the needle site are common side effects and will resolve in a few days. ° °5. Persistent numbness or new problems with movement should be communicated to the surgeon or the [facility] ([phone])/ [facility] ([phone]). ° ° ° °Post Anesthesia Home Care Instructions ° °Activity: °Get plenty of rest for the remainder of the day. A responsible adult should stay with you for 24 hours following the procedure.  °For the next 24 hours, DO NOT: °-Drive a car °-Operate machinery °-Drink alcoholic beverages °-Take any medication unless instructed by your physician °-Make any legal decisions or sign important papers. ° °Meals: °Start with liquid foods such as gelatin or soup. Progress to regular foods as tolerated. Avoid greasy, spicy, heavy foods. If nausea and/or vomiting occur, drink only clear liquids until the  nausea and/or vomiting subsides. Call your physician if vomiting continues. ° °Special Instructions/Symptoms: °Your throat may feel dry or sore from the anesthesia or the breathing tube placed in your throat during surgery. If this causes discomfort, gargle with warm salt water. The discomfort should disappear within 24 hours. ° °

## 2014-06-27 NOTE — Anesthesia Procedure Notes (Addendum)
Procedure Name: LMA Insertion Date/Time: 06/27/2014 7:34 AM Performed by: Melynda Ripple D Pre-anesthesia Checklist: Patient identified, Emergency Drugs available, Suction available and Patient being monitored Patient Re-evaluated:Patient Re-evaluated prior to inductionOxygen Delivery Method: Circle System Utilized Preoxygenation: Pre-oxygenation with 100% oxygen Intubation Type: IV induction Ventilation: Mask ventilation without difficulty LMA: LMA inserted LMA Size: 5.0 Number of attempts: 2 Airway Equipment and Method: bite block Placement Confirmation: positive ETCO2 Tube secured with: Tape Dental Injury: Teeth and Oropharynx as per pre-operative assessment     Anesthesia Regional Block:   Narrative:     Left Knee Block:  Prep with Chlorhexidine, 25g and 18g needle to bilateral portals with 10 ml of 0.5% Marcaine. Tolerated well.

## 2014-06-27 NOTE — Transfer of Care (Signed)
Immediate Anesthesia Transfer of Care Note  Patient: Micheal Baker  Procedure(s) Performed: Procedure(s): LEFT KNEE ARTHROSCOPY WITH PARTIAL MEDIAL AND LATERAL MENISECTOMIES AND CHONDROPLASTY (Left)  Patient Location: PACU  Anesthesia Type:General and Regional  Level of Consciousness: awake, alert  and patient cooperative  Airway & Oxygen Therapy: Patient Spontanous Breathing and Patient connected to nasal cannula oxygen  Post-op Assessment: Report given to PACU RN and Post -op Vital signs reviewed and stable  Post vital signs: Reviewed and stable  Complications: No apparent anesthesia complications

## 2014-06-27 NOTE — Interval H&P Note (Signed)
History and Physical Interval Note:  06/27/2014 7:20 AM  Micheal Baker  has presented today for surgery, with the diagnosis of LEFT KNEE MEDIAL MENISCAL TEAR  The various methods of treatment have been discussed with the patient and family. After consideration of risks, benefits and other options for treatment, the patient has consented to  Procedure(s): LEFT KNEE ARTHROSCOPY WITH MEDIAL MENISECTOMY (Left) as a surgical intervention .  The patient's history has been reviewed, patient examined, no change in status, stable for surgery.  I have reviewed the patient's chart and labs.  Questions were answered to the patient's satisfaction.     Elsie Saas A

## 2014-06-30 ENCOUNTER — Encounter (HOSPITAL_BASED_OUTPATIENT_CLINIC_OR_DEPARTMENT_OTHER): Payer: Self-pay | Admitting: Orthopedic Surgery

## 2014-07-31 ENCOUNTER — Ambulatory Visit (INDEPENDENT_AMBULATORY_CARE_PROVIDER_SITE_OTHER): Payer: Medicare Other | Admitting: Otolaryngology

## 2014-08-21 ENCOUNTER — Encounter (HOSPITAL_COMMUNITY): Payer: Self-pay | Admitting: Internal Medicine

## 2014-08-21 ENCOUNTER — Ambulatory Visit (INDEPENDENT_AMBULATORY_CARE_PROVIDER_SITE_OTHER): Payer: Medicare Other | Admitting: Otolaryngology

## 2014-08-21 DIAGNOSIS — H903 Sensorineural hearing loss, bilateral: Secondary | ICD-10-CM

## 2015-01-30 ENCOUNTER — Ambulatory Visit (INDEPENDENT_AMBULATORY_CARE_PROVIDER_SITE_OTHER): Payer: Medicare Other | Admitting: Cardiovascular Disease

## 2015-01-30 ENCOUNTER — Other Ambulatory Visit: Payer: Self-pay | Admitting: Cardiovascular Disease

## 2015-01-30 ENCOUNTER — Encounter: Payer: Self-pay | Admitting: Cardiovascular Disease

## 2015-01-30 VITALS — BP 118/70 | HR 50 | Ht 77.0 in | Wt 213.0 lb

## 2015-01-30 DIAGNOSIS — R5383 Other fatigue: Secondary | ICD-10-CM | POA: Diagnosis not present

## 2015-01-30 DIAGNOSIS — I493 Ventricular premature depolarization: Secondary | ICD-10-CM

## 2015-01-30 DIAGNOSIS — R110 Nausea: Secondary | ICD-10-CM

## 2015-01-30 DIAGNOSIS — E039 Hypothyroidism, unspecified: Secondary | ICD-10-CM

## 2015-01-30 DIAGNOSIS — I472 Ventricular tachycardia: Secondary | ICD-10-CM

## 2015-01-30 DIAGNOSIS — R531 Weakness: Secondary | ICD-10-CM

## 2015-01-30 DIAGNOSIS — I10 Essential (primary) hypertension: Secondary | ICD-10-CM

## 2015-01-30 DIAGNOSIS — I4729 Other ventricular tachycardia: Secondary | ICD-10-CM

## 2015-01-30 NOTE — Progress Notes (Signed)
Patient ID: Micheal Baker, male   DOB: [birth year], 69 y.o.   MRN: [id number]      SUBJECTIVE: The patient presents for followup for frequent PVCs and fatigue.  Echocardiogram in 05/2014 demonstrated normal left ventricular systolic function, EF 08-65%, mild LVH and mild diastolic dysfunction. Holter monitoring in 05/2014 demonstrated normal sinus rhythm with frequent PVCs and short runs of nonsustained ventricular tachycardia. The patient's symptoms correlated with normal sinus rhythm. He underwent a normal coronary angiogram in January 2013.  He had been feeling well for 8 months but has felt unwell for the past 7 days, complaining of fatigue and occasional dizziness while standing. He has had some nausea but denies vomiting. He denies fevers, chills, chest pain, leg swelling, and dysuria. He has had intermittent diarrhea. He has been several months since he has had any routine blood work and a long time since his last complete physical with his primary care physician.    Review of Systems: As per "subjective", otherwise negative.  Allergies  Allergen Reactions  . Penicillins     chills    Current Outpatient Prescriptions  Medication Sig Dispense Refill  . levothyroxine (SYNTHROID, LEVOTHROID) 50 MCG tablet Take 50 mcg by mouth daily.      . metoprolol (TOPROL-XL) 50 MG 24 hr tablet Take 25 mg by mouth daily.      . TURMERIC PO Take by mouth.     No current facility-administered medications for this visit.    Past Medical History  Diagnosis Date  . Hypertension   . Thyroid disease   . Hypothyroidism   . Depression   . Frequent PVCs   . NSVT (nonsustained ventricular tachycardia)   . Acute medial meniscus tear of left knee     Past Surgical History  Procedure Laterality Date  . Retinal detachment surgery      left  . Cataract extraction      left  . Knee arthroscopy      left  . Groin surgery  2010    growth removed   . Eye surgery      left-scar tissue  . Knee  arthroscopy with medial menisectomy Left 06/27/2014    Procedure: LEFT KNEE ARTHROSCOPY WITH PARTIAL MEDIAL AND LATERAL MENISECTOMIES AND CHONDROPLASTY;  Surgeon: Lorn Junes, MD;  Location: [facility];  Service: Orthopedics;  Laterality: Left;  . Left heart catheterization with coronary angiogram N/A 09/14/2011    Procedure: LEFT HEART CATHETERIZATION WITH CORONARY ANGIOGRAM;  Surgeon: Pixie Casino, MD;  Location: [facility] CATH LAB;  Service: Cardiovascular;  Laterality: N/A;    History   Social History  . Marital Status: Divorced    Spouse Name: N/A  . Number of Children: N/A  . Years of Education: N/A   Occupational History  . Part time [employer] work    Social History Main Topics  . Smoking status: Never Smoker   . Smokeless tobacco: Never Used     Comment: 06/10/14 1968- Tried it once and didn't like it- AJ  . Alcohol Use: Yes     Comment: occasionally  . Drug Use: No  . Sexual Activity: Not on file   Other Topics Concern  . Not on file   Social History Narrative     Filed Vitals:   01/30/15 1134  BP: 118/70  Pulse: 50  Height: 6\' 5"  (1.956 m)  Weight: 213 lb (96.616 kg)  SpO2: 98%    HR by auscultation: 62 bpm  PHYSICAL EXAM General:  NAD HEENT: Normal. Neck: No JVD, no thyromegaly. Lungs: Clear to auscultation bilaterally with normal respiratory effort. CV: Nondisplaced PMI.  Regular rate and mostly regular rhythm with premature contractions appreciated, normal S1/S2, no S3/S4, no murmur. No pretibial or periankle edema.  No carotid bruit.  Normal pedal pulses.  Abdomen: Soft, nontender, no hepatosplenomegaly, no distention.  Neurologic: Alert and oriented x 3.  Psych: Normal affect. Skin: Normal. Musculoskeletal: Normal range of motion, no gross deformities. Extremities: No clubbing or cyanosis.   ECG: Most recent ECG reviewed.      ASSESSMENT AND PLAN: 1. Fatigue with frequent PVC's and short runs of NSVT: This may be a consequence  of frequent symptomatic PVCs but doubtful. He may have some element of dehydration as he does not drink much water and 2 cups of coffee daily. Encouraged increased water intake. Continue Toprol-XL 25 mg daily. Testing results as noted above.  Will check TSH, free T4, BMET, and CBC. Encouraged to f/u with PCP.  2. Bradycardia: As I again explained to the patient, I do not believe he was actually bradycardic. Pulse oximeters tend not to be able to detect PVCs and thus the calculated heart rate is low.   3. Essential HTN: Controlled on present therapy. No changes.  4. Fatigue with known hypothyroidism: Will check TSH and free T4.  Dispo: f/u 1 year.  Kate Sable, M.D., F.A.C.C.

## 2015-01-30 NOTE — Patient Instructions (Signed)
   Labs for CBC, BMET, TSH, free T4 Office will contact with results via phone or letter.   Continue all current medications. Your physician wants you to follow up in:  1 year.  You will receive a reminder letter in the mail one-two months in advance.  If you don't receive a letter, please call our office to schedule the follow up appointment

## 2015-01-31 LAB — TSH: TSH: 3.721 u[IU]/mL (ref 0.350–4.500)

## 2015-01-31 LAB — BASIC METABOLIC PANEL
BUN: 19 mg/dL (ref 6–23)
CALCIUM: 9.4 mg/dL (ref 8.4–10.5)
CO2: 25 mEq/L (ref 19–32)
CREATININE: 1.04 mg/dL (ref 0.50–1.35)
Chloride: 103 mEq/L (ref 96–112)
Glucose, Bld: 110 mg/dL — ABNORMAL HIGH (ref 70–99)
Potassium: 4.3 mEq/L (ref 3.5–5.3)
SODIUM: 137 meq/L (ref 135–145)

## 2015-01-31 LAB — CBC
HCT: 43 % (ref 39.0–52.0)
Hemoglobin: 15.1 g/dL (ref 13.0–17.0)
MCH: 29.8 pg (ref 26.0–34.0)
MCHC: 35.1 g/dL (ref 30.0–36.0)
MCV: 85 fL (ref 78.0–100.0)
MPV: 9.7 fL (ref 8.6–12.4)
PLATELETS: 235 10*3/uL (ref 150–400)
RBC: 5.06 MIL/uL (ref 4.22–5.81)
RDW: 13.6 % (ref 11.5–15.5)
WBC: 5.6 10*3/uL (ref 4.0–10.5)

## 2015-01-31 LAB — T4, FREE: FREE T4: 1.09 ng/dL (ref 0.80–1.80)

## 2015-02-02 ENCOUNTER — Telehealth: Payer: Self-pay | Admitting: *Deleted

## 2015-02-02 NOTE — Telephone Encounter (Signed)
Notes Recorded by Laurine Blazer, LPN on 0/32/1224 at 8:25 PM Patient notified and verbalized understanding.   CBC, BMET, T4 free, TSH - okay per Dr. Bronson Ing.

## 2015-02-02 NOTE — Telephone Encounter (Signed)
-----   Message from [place] sent at 02/02/2015  7:45 AM EDT -----   ----- Message -----    From: Herminio Commons, MD    Sent: 02/01/2015   7:58 AM      To: Massie Maroon, CMA  Ok.

## 2015-03-06 NOTE — Progress Notes (Signed)
Will do BMET for sugery 03/13/15.  EKG and Echo done 9/15.  Pt followed by Cardiology in [place].

## 2015-03-10 NOTE — H&P (Signed)
Micheal Baker is an 69 y.o. male.   Chief Complaint: right knee pain HPI: Micheal Baker is a 69 year-old seen for follow up evaluation from his significant acute right knee pain secondary to doing a lot of yard work.  We obtained an MRI of the right knee on February 27, 2015 that revealed a medial and lateral meniscus tear with mild chondromalacia.  He is having sharp catching pain in the knee.  He is 8 months status post left knee arthroscopy for partial meniscectomies and chondroplasty.    Past Medical History  Diagnosis Date  . Hypertension   . Thyroid disease   . Hypothyroidism   . Depression   . Frequent PVCs   . NSVT (nonsustained ventricular tachycardia)   . Acute medial meniscus tear of left knee     Past Surgical History  Procedure Laterality Date  . Retinal detachment surgery      left  . Cataract extraction      left  . Knee arthroscopy      left  . Groin surgery  2010    growth removed   . Eye surgery      left-scar tissue  . Knee arthroscopy with medial menisectomy Left 06/27/2014    Procedure: LEFT KNEE ARTHROSCOPY WITH PARTIAL MEDIAL AND LATERAL MENISECTOMIES AND CHONDROPLASTY;  Surgeon: Lorn Junes, MD;  Location: [facility];  Service: Orthopedics;  Laterality: Left;  . Left heart catheterization with coronary angiogram N/A 09/14/2011    Procedure: LEFT HEART CATHETERIZATION WITH CORONARY ANGIOGRAM;  Surgeon: Pixie Casino, MD;  Location: [facility] CATH LAB;  Service: Cardiovascular;  Laterality: N/A;    Family History  Problem Relation Age of Onset  . Asthma Daughter   . Hypertension Mother   . Hypertension Father   . Heart attack Father    Social History:  reports that he has never smoked. He has never used smokeless tobacco. He reports that he drinks alcohol. He reports that he does not use illicit drugs.  Allergies:  Allergies  Allergen Reactions  . Penicillins     chills    No prescriptions prior to admission    No results found for this or  any previous visit (from the past 48 hour(s)). No results found.  Review of Systems  Constitutional: Negative.   HENT: Negative.   Eyes: Negative.   Respiratory: Negative.   Cardiovascular: Negative.   Gastrointestinal: Negative.   Genitourinary: Negative.   Musculoskeletal: Positive for joint pain.       Right knee pain  Skin: Negative.   Endo/Heme/Allergies: Negative.   Psychiatric/Behavioral: Negative.     Height 6\' 5"  (1.956 m), weight 90.719 kg (200 lb). Physical Exam  Constitutional: He is oriented to person, place, and time. He appears well-developed and well-nourished.  HENT:  Head: Normocephalic and atraumatic.  Mouth/Throat: Oropharynx is clear and moist.  Eyes: Conjunctivae are normal. Pupils are equal, round, and reactive to light.  Neck: Neck supple.  Cardiovascular: Normal rate and regular rhythm.   Respiratory: Effort normal.  GI: Soft.  Genitourinary:  Not pertinent to current symptomatology therefore not examined.  Musculoskeletal:  Examination of his right knee reveals pain on the medial joint line.  Positive medial McMurray's.  1+ effusion.  Range of motion is from 0-120 degrees.  Knee is stable with diffuse pain.  Examination of his left knee reveals full range of motion without pain, swelling, weakness or instability.  Vascular exam: Pulses are 2+ and symmetric.  Neurological:  He is alert and oriented to person, place, and time.  Skin: Skin is warm and dry.  Psychiatric: He has a normal mood and affect.     Assessment .Principal Problem:   Acute lateral meniscus tear of left knee Active Problems:   Benign hypertension   Hypothyroidism   Bradycardia   Acute medial meniscus tear of left knee   NSVT (nonsustained ventricular tachycardia)  Plan Due to his significant pain and lack of response to conservative care and findings on MRI, I would recommend that we proceed with right knee arthroscopy with attention to his meniscal and chondral pathology.   Risks, complications and benefits of the surgery have been described to him in detail and he understands this completely  Linda Hedges 03/10/2015, 12:25 PM

## 2015-03-11 ENCOUNTER — Ambulatory Visit (HOSPITAL_BASED_OUTPATIENT_CLINIC_OR_DEPARTMENT_OTHER)
Admission: RE | Admit: 2015-03-11 | Discharge: 2015-03-11 | Disposition: A | Discharge status: Home / Self Care | Payer: Medicare Other | Source: Ambulatory Visit | Attending: Orthopedic Surgery | Admitting: Orthopedic Surgery

## 2015-03-11 ENCOUNTER — Ambulatory Visit (HOSPITAL_BASED_OUTPATIENT_CLINIC_OR_DEPARTMENT_OTHER): Payer: Medicare Other | Admitting: Certified Registered"

## 2015-03-11 ENCOUNTER — Encounter (HOSPITAL_BASED_OUTPATIENT_CLINIC_OR_DEPARTMENT_OTHER)
Admission: RE | Disposition: A | Discharge status: Home / Self Care | Payer: Self-pay | Source: Ambulatory Visit | Attending: Orthopedic Surgery

## 2015-03-11 ENCOUNTER — Encounter (HOSPITAL_BASED_OUTPATIENT_CLINIC_OR_DEPARTMENT_OTHER): Payer: Self-pay | Admitting: Certified Registered"

## 2015-03-11 DIAGNOSIS — I472 Ventricular tachycardia: Secondary | ICD-10-CM | POA: Diagnosis not present

## 2015-03-11 DIAGNOSIS — I10 Essential (primary) hypertension: Secondary | ICD-10-CM | POA: Diagnosis present

## 2015-03-11 DIAGNOSIS — M94261 Chondromalacia, right knee: Secondary | ICD-10-CM | POA: Diagnosis not present

## 2015-03-11 DIAGNOSIS — X58XXXA Exposure to other specified factors, initial encounter: Secondary | ICD-10-CM | POA: Diagnosis not present

## 2015-03-11 DIAGNOSIS — F329 Major depressive disorder, single episode, unspecified: Secondary | ICD-10-CM | POA: Diagnosis not present

## 2015-03-11 DIAGNOSIS — R001 Bradycardia, unspecified: Secondary | ICD-10-CM | POA: Diagnosis present

## 2015-03-11 DIAGNOSIS — S83282A Other tear of lateral meniscus, current injury, left knee, initial encounter: Secondary | ICD-10-CM | POA: Diagnosis present

## 2015-03-11 DIAGNOSIS — E039 Hypothyroidism, unspecified: Secondary | ICD-10-CM | POA: Diagnosis not present

## 2015-03-11 DIAGNOSIS — Y93H2 Activity, gardening and landscaping: Secondary | ICD-10-CM | POA: Diagnosis not present

## 2015-03-11 DIAGNOSIS — Y998 Other external cause status: Secondary | ICD-10-CM | POA: Diagnosis not present

## 2015-03-11 DIAGNOSIS — Y92096 Garden or yard of other non-institutional residence as the place of occurrence of the external cause: Secondary | ICD-10-CM | POA: Insufficient documentation

## 2015-03-11 DIAGNOSIS — I4729 Other ventricular tachycardia: Secondary | ICD-10-CM

## 2015-03-11 DIAGNOSIS — S83281A Other tear of lateral meniscus, current injury, right knee, initial encounter: Secondary | ICD-10-CM | POA: Insufficient documentation

## 2015-03-11 DIAGNOSIS — S83242A Other tear of medial meniscus, current injury, left knee, initial encounter: Secondary | ICD-10-CM | POA: Diagnosis present

## 2015-03-11 DIAGNOSIS — S83241A Other tear of medial meniscus, current injury, right knee, initial encounter: Secondary | ICD-10-CM | POA: Diagnosis not present

## 2015-03-11 DIAGNOSIS — Z880 Allergy status to penicillin: Secondary | ICD-10-CM | POA: Insufficient documentation

## 2015-03-11 HISTORY — PX: KNEE ARTHROSCOPY WITH LATERAL MENISECTOMY: SHX6193

## 2015-03-11 HISTORY — PX: KNEE ARTHROSCOPY WITH MEDIAL MENISECTOMY: SHX5651

## 2015-03-11 LAB — POCT HEMOGLOBIN-HEMACUE: Hemoglobin: 16.4 g/dL (ref 13.0–17.0)

## 2015-03-11 SURGERY — ARTHROSCOPY, KNEE, WITH LATERAL MENISCECTOMY
Anesthesia: Regional | Site: Knee | Laterality: Right

## 2015-03-11 MED ORDER — DEXAMETHASONE SODIUM PHOSPHATE 4 MG/ML IJ SOLN
INTRAMUSCULAR | Status: DC | PRN
  Administered 2015-03-11: 10 mg via INTRAVENOUS

## 2015-03-11 MED ORDER — FENTANYL CITRATE (PF) 100 MCG/2ML IJ SOLN
INTRAMUSCULAR | Status: AC
  Filled 2015-03-11: qty 4

## 2015-03-11 MED ORDER — EPINEPHRINE HCL 1 MG/ML IJ SOLN
INTRAMUSCULAR | Status: DC | PRN
  Administered 2015-03-11: 1 mg

## 2015-03-11 MED ORDER — OXYCODONE HCL 5 MG PO TABS
5.0000 mg | ORAL_TABLET | Freq: Once | ORAL | Status: AC | PRN
  Administered 2015-03-11: 5 mg via ORAL

## 2015-03-11 MED ORDER — MIDAZOLAM HCL 2 MG/2ML IJ SOLN
INTRAMUSCULAR | Status: AC
  Filled 2015-03-11: qty 2

## 2015-03-11 MED ORDER — FENTANYL CITRATE (PF) 100 MCG/2ML IJ SOLN
50.0000 ug | INTRAMUSCULAR | Status: AC | PRN
  Administered 2015-03-11 (×2): 25 ug via INTRAVENOUS
  Administered 2015-03-11: 50 ug via INTRAVENOUS

## 2015-03-11 MED ORDER — PROPOFOL 10 MG/ML IV BOLUS
INTRAVENOUS | Status: DC | PRN
  Administered 2015-03-11: 200 mg via INTRAVENOUS
  Administered 2015-03-11: 50 mg via INTRAVENOUS

## 2015-03-11 MED ORDER — HYDROMORPHONE HCL 1 MG/ML IJ SOLN
INTRAMUSCULAR | Status: AC
  Filled 2015-03-11: qty 1

## 2015-03-11 MED ORDER — VANCOMYCIN HCL IN DEXTROSE 1-5 GM/200ML-% IV SOLN
1000.0000 mg | INTRAVENOUS | Status: AC
  Administered 2015-03-11: 1000 mg via INTRAVENOUS

## 2015-03-11 MED ORDER — MIDAZOLAM HCL 2 MG/2ML IJ SOLN
1.0000 mg | INTRAMUSCULAR | Status: DC | PRN
  Administered 2015-03-11: 1 mg via INTRAVENOUS

## 2015-03-11 MED ORDER — ONDANSETRON HCL 4 MG/2ML IJ SOLN
INTRAMUSCULAR | Status: DC | PRN
  Administered 2015-03-11: 4 mg via INTRAVENOUS

## 2015-03-11 MED ORDER — SCOPOLAMINE 1 MG/3DAYS TD PT72: 1.0000 | MEDICATED_PATCH | Freq: Once | TRANSDERMAL | Status: DC | PRN

## 2015-03-11 MED ORDER — LACTATED RINGERS IV SOLN
INTRAVENOUS | Status: DC
  Administered 2015-03-11 (×2): via INTRAVENOUS

## 2015-03-11 MED ORDER — HYDROCODONE-ACETAMINOPHEN 5-325 MG PO TABS: ORAL_TABLET | ORAL | Status: DC

## 2015-03-11 MED ORDER — BUPIVACAINE HCL (PF) 0.5 % IJ SOLN
INTRAMUSCULAR | Status: DC | PRN
  Administered 2015-03-11: 10 mL

## 2015-03-11 MED ORDER — OXYCODONE HCL 5 MG PO TABS
ORAL_TABLET | ORAL | Status: AC
  Filled 2015-03-11: qty 1

## 2015-03-11 MED ORDER — FENTANYL CITRATE (PF) 100 MCG/2ML IJ SOLN
INTRAMUSCULAR | Status: AC
  Filled 2015-03-11: qty 2

## 2015-03-11 MED ORDER — LIDOCAINE HCL (CARDIAC) 20 MG/ML IV SOLN
INTRAVENOUS | Status: DC | PRN
  Administered 2015-03-11: 80 mg via INTRAVENOUS

## 2015-03-11 MED ORDER — SODIUM CHLORIDE 0.9 % IR SOLN
Status: DC | PRN
  Administered 2015-03-11: 6000 mL

## 2015-03-11 MED ORDER — VANCOMYCIN HCL IN DEXTROSE 500-5 MG/100ML-% IV SOLN
INTRAVENOUS | Status: AC
  Filled 2015-03-11: qty 200

## 2015-03-11 MED ORDER — PROPOFOL 10 MG/ML IV BOLUS
INTRAVENOUS | Status: AC
  Filled 2015-03-11: qty 40

## 2015-03-11 MED ORDER — HYDROMORPHONE HCL 1 MG/ML IJ SOLN
INTRAMUSCULAR | Status: AC
Start: 2015-03-11 — End: 2015-03-11
  Filled 2015-03-11: qty 1

## 2015-03-11 MED ORDER — ONDANSETRON HCL 4 MG/2ML IJ SOLN: 4.0000 mg | Freq: Four times a day (QID) | INTRAMUSCULAR | Status: DC | PRN

## 2015-03-11 MED ORDER — OXYCODONE HCL 5 MG/5ML PO SOLN: 5.0000 mg | Freq: Once | ORAL | Status: AC | PRN

## 2015-03-11 MED ORDER — GLYCOPYRROLATE 0.2 MG/ML IJ SOLN: 0.2000 mg | Freq: Once | INTRAMUSCULAR | Status: DC | PRN

## 2015-03-11 MED ORDER — HYDROMORPHONE HCL 1 MG/ML IJ SOLN
0.2500 mg | INTRAMUSCULAR | Status: DC | PRN
  Administered 2015-03-11 (×3): 0.5 mg via INTRAVENOUS

## 2015-03-11 MED ORDER — CHLORHEXIDINE GLUCONATE 4 % EX LIQD: 60.0000 mL | Freq: Once | CUTANEOUS | Status: DC

## 2015-03-11 SURGICAL SUPPLY — 43 items
BANDAGE ELASTIC 6 VELCRO ST LF (GAUZE/BANDAGES/DRESSINGS) ×3 IMPLANT
BLADE CUTTER GATOR 3.5 (BLADE) IMPLANT
BLADE GREAT WHITE 4.2 (BLADE) ×2 IMPLANT
BLADE GREAT WHITE 4.2MM (BLADE) ×1
BLADE SURG 15 STRL LF DISP TIS (BLADE) IMPLANT
BLADE SURG 15 STRL SS (BLADE)
BNDG COHESIVE 4X5 TAN STRL (GAUZE/BANDAGES/DRESSINGS) IMPLANT
DRAPE ARTHROSCOPY W/POUCH 90 (DRAPES) ×3 IMPLANT
DURAPREP 26ML APPLICATOR (WOUND CARE) ×3 IMPLANT
GAUZE SPONGE 4X4 12PLY STRL (GAUZE/BANDAGES/DRESSINGS) ×3 IMPLANT
GAUZE XEROFORM 1X8 LF (GAUZE/BANDAGES/DRESSINGS) ×3 IMPLANT
GLOVE BIO SURGEON STRL SZ 6.5 (GLOVE) ×2 IMPLANT
GLOVE BIO SURGEON STRL SZ7 (GLOVE) ×3 IMPLANT
GLOVE BIO SURGEONS STRL SZ 6.5 (GLOVE) ×1
GLOVE BIOGEL PI IND STRL 7.0 (GLOVE) ×2 IMPLANT
GLOVE BIOGEL PI IND STRL 7.5 (GLOVE) ×1 IMPLANT
GLOVE BIOGEL PI INDICATOR 7.0 (GLOVE) ×4
GLOVE BIOGEL PI INDICATOR 7.5 (GLOVE) ×2
GLOVE SS BIOGEL STRL SZ 7.5 (GLOVE) ×1 IMPLANT
GLOVE SUPERSENSE BIOGEL SZ 7.5 (GLOVE) ×2
GOWN STRL REUS W/ TWL LRG LVL3 (GOWN DISPOSABLE) ×3 IMPLANT
GOWN STRL REUS W/TWL LRG LVL3 (GOWN DISPOSABLE) ×6
HOLDER KNEE FOAM BLUE (MISCELLANEOUS) ×3 IMPLANT
KNEE WRAP E Z 3 GEL PACK (MISCELLANEOUS) ×3 IMPLANT
MANIFOLD NEPTUNE II (INSTRUMENTS) ×3 IMPLANT
NDL SAFETY ECLIPSE 18X1.5 (NEEDLE) ×2 IMPLANT
NEEDLE HYPO 18GX1.5 SHARP (NEEDLE) ×4
NEEDLE HYPO 22GX1.5 SAFETY (NEEDLE) IMPLANT
PACK ARTHROSCOPY DSU (CUSTOM PROCEDURE TRAY) ×3 IMPLANT
PACK BASIN DAY SURGERY FS (CUSTOM PROCEDURE TRAY) ×3 IMPLANT
PAD ALCOHOL SWAB (MISCELLANEOUS) ×6 IMPLANT
SET ARTHROSCOPY TUBING (MISCELLANEOUS) ×2
SET ARTHROSCOPY TUBING LN (MISCELLANEOUS) ×1 IMPLANT
SUCTION FRAZIER TIP 10 FR DISP (SUCTIONS) IMPLANT
SUT ETHILON 4 0 PS 2 18 (SUTURE) ×3 IMPLANT
SUT PROLENE 3 0 PS 2 (SUTURE) IMPLANT
SUT VIC AB 3-0 PS1 18 (SUTURE)
SUT VIC AB 3-0 PS1 18XBRD (SUTURE) IMPLANT
SYR 20CC LL (SYRINGE) ×3 IMPLANT
SYR 5ML LL (SYRINGE) ×3 IMPLANT
TOWEL OR 17X24 6PK STRL BLUE (TOWEL DISPOSABLE) ×3 IMPLANT
WAND STAR VAC 90 (SURGICAL WAND) IMPLANT
WATER STERILE IRR 1000ML POUR (IV SOLUTION) ×3 IMPLANT

## 2015-03-11 NOTE — Anesthesia Preprocedure Evaluation (Signed)
Anesthesia Evaluation  Patient identified by MRN, date of birth, ID band Patient awake    Reviewed: Allergy & Precautions, NPO status , Patient's Chart, lab work & pertinent test results  Airway Mallampati: II   Neck ROM: full    Dental   Pulmonary neg pulmonary ROS,  breath sounds clear to auscultation        Cardiovascular hypertension, Rhythm:regular Rate:Normal  H/o PVCs   Neuro/Psych Depression    GI/Hepatic   Endo/Other  Hypothyroidism   Renal/GU      Musculoskeletal   Abdominal   Peds  Hematology   Anesthesia Other Findings   Reproductive/Obstetrics                             Anesthesia Physical Anesthesia Plan  ASA: II  Anesthesia Plan: General and Regional   Post-op Pain Management: MAC Combined w/ Regional for Post-op pain   Induction: Intravenous  Airway Management Planned: LMA  Additional Equipment:   Intra-op Plan:   Post-operative Plan:   Informed Consent: I have reviewed the patients History and Physical, chart, labs and discussed the procedure including the risks, benefits and alternatives for the proposed anesthesia with the patient or authorized representative who has indicated his/her understanding and acceptance.     Plan Discussed with: CRNA, Anesthesiologist and Surgeon  Anesthesia Plan Comments:         Anesthesia Quick Evaluation

## 2015-03-11 NOTE — Interval H&P Note (Signed)
History and Physical Interval Note:  03/11/2015 10:59 AM  [facility] Nurse  has presented today for surgery, with the diagnosis of RIGHT MEDIAL AND LATERAL MENISCUS TEAR  The various methods of treatment have been discussed with the patient and family. After consideration of risks, benefits and other options for treatment, the patient has consented to  Procedure(s): RIGHT KNEE ARTHROSCOPY WITH MEDIALAND LATERAL MENISECTOMY (Right) as a surgical intervention .  The patient's history has been reviewed, patient examined, no change in status, stable for surgery.  I have reviewed the patient's chart and labs.  Questions were answered to the patient's satisfaction.     Elsie Saas A

## 2015-03-11 NOTE — Discharge Instructions (Signed)
°  Post Anesthesia Home Care Instructions ° °Activity: °Get plenty of rest for the remainder of the day. A responsible adult should stay with you for 24 hours following the procedure.  °For the next 24 hours, DO NOT: °-Drive a car °-Operate machinery °-Drink alcoholic beverages °-Take any medication unless instructed by your physician °-Make any legal decisions or sign important papers. ° °Meals: °Start with liquid foods such as gelatin or soup. Progress to regular foods as tolerated. Avoid greasy, spicy, heavy foods. If nausea and/or vomiting occur, drink only clear liquids until the nausea and/or vomiting subsides. Call your physician if vomiting continues. ° °Special Instructions/Symptoms: °Your throat may feel dry or sore from the anesthesia or the breathing tube placed in your throat during surgery. If this causes discomfort, gargle with warm salt water. The discomfort should disappear within 24 hours. ° °If you had a scopolamine patch placed behind your ear for the management of post- operative nausea and/or vomiting: ° °1. The medication in the patch is effective for 72 hours, after which it should be removed.  Wrap patch in a tissue and discard in the trash. Wash hands thoroughly with soap and water. °2. You may remove the patch earlier than 72 hours if you experience unpleasant side effects which may include dry mouth, dizziness or visual disturbances. °3. Avoid touching the patch. Wash your hands with soap and water after contact with the patch. °  °Regional Anesthesia Blocks ° °1. Numbness or the inability to move the "blocked" extremity may last from 3-48 hours after placement. The length of time depends on the medication injected and your individual response to the medication. If the numbness is not going away after 48 hours, call your surgeon. ° °2. The extremity that is blocked will need to be protected until the numbness is gone and the  Strength has returned. Because you cannot feel it, you will need  to take extra care to avoid injury. Because it may be weak, you may have difficulty moving it or using it. You may not know what position it is in without looking at it while the block is in effect. ° °3. For blocks in the legs and feet, returning to weight bearing and walking needs to be done carefully. You will need to wait until the numbness is entirely gone and the strength has returned. You should be able to move your leg and foot normally before you try and bear weight or walk. You will need someone to be with you when you first try to ensure you do not fall and possibly risk injury. ° °4. Bruising and tenderness at the needle site are common side effects and will resolve in a few days. ° °5. Persistent numbness or new problems with movement should be communicated to the surgeon or the [facility] ([phone])/ [facility] ([phone]). °

## 2015-03-11 NOTE — Anesthesia Postprocedure Evaluation (Signed)
Anesthesia Post Note  Patient: Micheal Baker  Procedure(s) Performed: Procedure(s) (LRB): RIGHT KNEE ARTHROSCOPY WITH MEDIALAND LATERAL MENISECTOMY (Right) KNEE ARTHROSCOPY WITH MEDIAL MENISECTOMY (Right)  Anesthesia type: General  Patient location: PACU  Post pain: Pain level controlled and Adequate analgesia  Post assessment: Post-op Vital signs reviewed, Patient's Cardiovascular Status Stable, Respiratory Function Stable, Patent Airway and Pain level controlled  Last Vitals:  Filed Vitals:   03/11/15 1230  BP: 131/74  Pulse: 64  Temp: 36.4 C  Resp: 18    Post vital signs: Reviewed and stable  Level of consciousness: awake, alert  and oriented  Complications: No apparent anesthesia complications

## 2015-03-11 NOTE — Progress Notes (Signed)
Assisted Dr. Hodierne with right, knee block. Side rails up, monitors on throughout procedure. See vital signs in flow sheet. Tolerated Procedure well. 

## 2015-03-11 NOTE — Anesthesia Procedure Notes (Addendum)
Anesthesia Regional Block:  Knee block  Pre-Anesthetic Checklist: ,, timeout performed, Correct Patient, Correct Site, Correct Laterality, Correct Procedure, Correct Position, site marked, Risks and benefits discussed,  Surgical consent,  Pre-op evaluation,  At surgeon's request and post-op pain management  Laterality: Right  Prep: chloraprep       Needles:  Injection technique: Single-shot  Needle Type: Other      Needle Gauge: 25 and 25 G    Additional Needles: Knee block Narrative:  Start time: 03/11/2015 10:58 AM End time: 03/11/2015 11:03 AM  Performed by: Personally  Anesthesiologist: HODIERNE, ADAM  Additional Notes: Two locations were injected on the right knee area.  Inferolateral and inferomedial to the patella of the right knee.  18mL of 0.5% bupivacaine was injected SQ at each site.  Pt tolerated the procedure well.   Procedure Name: LMA Insertion Date/Time: 03/11/2015 11:17 AM Performed by: Baxter Flattery Pre-anesthesia Checklist: Patient identified, Emergency Drugs available, Suction available and Patient being monitored Patient Re-evaluated:Patient Re-evaluated prior to inductionOxygen Delivery Method: Circle System Utilized Preoxygenation: Pre-oxygenation with 100% oxygen Intubation Type: IV induction Ventilation: Mask ventilation without difficulty LMA: LMA inserted LMA Size: 4.5 Number of attempts: 1 Airway Equipment and Method: Bite block Placement Confirmation: positive ETCO2 and breath sounds checked- equal and bilateral Tube secured with: Tape Dental Injury: Teeth and Oropharynx as per pre-operative assessment

## 2015-03-11 NOTE — Transfer of Care (Signed)
Immediate Anesthesia Transfer of Care Note  Patient: Micheal Baker  Procedure(s) Performed: Procedure(s): RIGHT KNEE ARTHROSCOPY WITH MEDIALAND LATERAL MENISECTOMY (Right) KNEE ARTHROSCOPY WITH MEDIAL MENISECTOMY (Right)  Patient Location: PACU  Anesthesia Type:GA combined with regional for post-op pain  Level of Consciousness: awake, alert  and oriented  Airway & Oxygen Therapy: Patient Spontanous Breathing and Patient connected to face mask oxygen  Post-op Assessment: Report given to RN, Post -op Vital signs reviewed and stable and Patient moving all extremities  Post vital signs: Reviewed and stable  Last Vitals:  Filed Vitals:   03/11/15 1100  BP: 141/118  Pulse: 73  Temp:   Resp: 20    Complications: No apparent anesthesia complications

## 2015-03-12 ENCOUNTER — Encounter (HOSPITAL_BASED_OUTPATIENT_CLINIC_OR_DEPARTMENT_OTHER): Payer: Self-pay | Admitting: Orthopedic Surgery

## 2015-03-12 NOTE — Addendum Note (Signed)
Addendum  created 03/12/15 1232 by Tawni Millers, CRNA   Modules edited: Charges VN

## 2015-03-12 NOTE — Op Note (Signed)
NAMEGRAINGER, MCCARLEY                  ACCOUNT NO.:  [id number]  MEDICAL RECORD NO.:  [id number]  LOCATION:                               FACILITY:  [facility]  PHYSICIAN:  Sigmund Morera A. Noemi Chapel, M.D. DATE OF BIRTH:  13-Nov-1945  DATE OF PROCEDURE:  03/11/2015 DATE OF DISCHARGE:  03/11/2015                              OPERATIVE REPORT   PREOPERATIVE DIAGNOSES: 1. Right knee acute traumatic medial and lateral meniscal tears. 2. 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  POSTOPERATIVE DIAGNOSIS: 1. Right knee acute traumatic medial and lateral meniscal tears. 2. 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  PROCEDURE: 1. Right knee EUA, followed by arthroscopic partial medial and lateral     meniscectomies. 2. Right knee tricompartmental chondroplasty.  SURGEON:  Audree Camel. Noemi Chapel, M.D.  ASSISTANT:  Matthew Saras, PA-C  ANESTHESIA:  General.  OPERATIVE TIME:  30 minutes.  COMPLICATIONS:  None.  INDICATION FOR PROCEDURE:  Mr. Banghart is a 69 year old gentleman, who has had significant right knee pain for the past 3 months secondary to repetitive bending, twisting, and turning.  Exam and MRI has revealed medial and lateral meniscal tears with chondromalacia.  He has failed conservative care and is now to undergo arthroscopy.  OPERATIVE DESCRIPTION:  Ms. Quiroa was brought to the operating room on March 11, 2015, after a knee block was placed in the holding room by anesthesia.  He was placed on operative table in supine position.  He received antibiotics preoperatively for prophylaxis.  After being placed under general anesthesia, his right knee was examined.  He had full range of motion.  Knee was stable.  Ligamentous exam with normal patellar tracking.  The right leg was prepped using sterile DuraPrep and draped using sterile technique.  Time-out procedure was called and the correct right knee identified.  Initially, through an  anterolateral portal, the arthroscope with a pump attached was placed into an anteromedial portal, an arthroscopic probe was placed.  On initial inspection of the medial compartment, he was found have 50%-60% grade III chondromalacia with articular cartilage deposits consistent with possible gout.  This was debrided.  Medial meniscus tearing of the posterior and medial horn of which 40% was resected back to a stable rim.  Intercondylar notch inspected.  Anterior and posterior cruciate ligaments were normal.  Lateral compartment inspected.  The articular cartilage showed 30% grade III chondromalacia with articular cartilage crystalline deposits, this was debrided.  Lateral meniscus tear of the posterior and lateral horn of which 30%-40% was resected back to a stable rim.  Patellofemoral joint showed 30% grade III chondromalacia, which was debrided.  The patella tracked normally.  Medial and lateral gutters were free of pathology.  After this done, it is felt that all pathology have been satisfactorily addressed.  The instruments are removed.  Portals closed with 3-0 nylon suture.  Sterile dressings were applied.  The patient awakened and taken to recovery room in stable condition.  FOLLOWUP CARE:  Mr. Mckelvin will be followed as an outpatient on Norco for pain.  Seen back in office in a week for sutures out and followup.     [place] A. Noemi Chapel, M.D.  RAW/MEDQ  D:  03/11/2015  T:  03/12/2015  Job:  [id number]

## 2015-03-31 ENCOUNTER — Emergency Department (HOSPITAL_COMMUNITY)
Admission: EM | Admit: 2015-03-31 | Discharge: 2015-03-31 | Disposition: A | Discharge status: Home / Self Care | Payer: Medicare Other | Attending: Emergency Medicine | Admitting: Emergency Medicine

## 2015-03-31 ENCOUNTER — Encounter (HOSPITAL_COMMUNITY): Payer: Self-pay

## 2015-03-31 DIAGNOSIS — I10 Essential (primary) hypertension: Secondary | ICD-10-CM | POA: Diagnosis not present

## 2015-03-31 DIAGNOSIS — Z8659 Personal history of other mental and behavioral disorders: Secondary | ICD-10-CM | POA: Insufficient documentation

## 2015-03-31 DIAGNOSIS — Z79899 Other long term (current) drug therapy: Secondary | ICD-10-CM | POA: Insufficient documentation

## 2015-03-31 DIAGNOSIS — Z880 Allergy status to penicillin: Secondary | ICD-10-CM | POA: Insufficient documentation

## 2015-03-31 DIAGNOSIS — Z87828 Personal history of other (healed) physical injury and trauma: Secondary | ICD-10-CM | POA: Diagnosis not present

## 2015-03-31 DIAGNOSIS — E039 Hypothyroidism, unspecified: Secondary | ICD-10-CM | POA: Insufficient documentation

## 2015-03-31 DIAGNOSIS — I499 Cardiac arrhythmia, unspecified: Secondary | ICD-10-CM | POA: Diagnosis present

## 2015-03-31 DIAGNOSIS — I493 Ventricular premature depolarization: Secondary | ICD-10-CM | POA: Diagnosis not present

## 2015-03-31 DIAGNOSIS — Z9889 Other specified postprocedural states: Secondary | ICD-10-CM | POA: Insufficient documentation

## 2015-03-31 LAB — CBC WITH DIFFERENTIAL/PLATELET
Basophils Absolute: 0 10*3/uL (ref 0.0–0.1)
Basophils Relative: 0 % (ref 0–1)
Eosinophils Absolute: 0.2 10*3/uL (ref 0.0–0.7)
Eosinophils Relative: 3 % (ref 0–5)
HEMATOCRIT: 42.7 % (ref 39.0–52.0)
HEMOGLOBIN: 15.2 g/dL (ref 13.0–17.0)
Lymphocytes Relative: 28 % (ref 12–46)
Lymphs Abs: 1.4 10*3/uL (ref 0.7–4.0)
MCH: 30 pg (ref 26.0–34.0)
MCHC: 35.6 g/dL (ref 30.0–36.0)
MCV: 84.4 fL (ref 78.0–100.0)
Monocytes Absolute: 0.5 10*3/uL (ref 0.1–1.0)
Monocytes Relative: 9 % (ref 3–12)
NEUTROS ABS: 2.9 10*3/uL (ref 1.7–7.7)
Neutrophils Relative %: 60 % (ref 43–77)
PLATELETS: 203 10*3/uL (ref 150–400)
RBC: 5.06 MIL/uL (ref 4.22–5.81)
RDW: 12.8 % (ref 11.5–15.5)
WBC: 5 10*3/uL (ref 4.0–10.5)

## 2015-03-31 LAB — COMPREHENSIVE METABOLIC PANEL
ALT: 25 U/L (ref 17–63)
AST: 24 U/L (ref 15–41)
Albumin: 4 g/dL (ref 3.5–5.0)
Alkaline Phosphatase: 52 U/L (ref 38–126)
Anion gap: 9 (ref 5–15)
BUN: 16 mg/dL (ref 6–20)
CALCIUM: 9 mg/dL (ref 8.9–10.3)
CO2: 25 mmol/L (ref 22–32)
CREATININE: 1.09 mg/dL (ref 0.61–1.24)
Chloride: 104 mmol/L (ref 101–111)
GFR calc Af Amer: >60 mL/min (ref >60)
GLUCOSE: 104 mg/dL — AB (ref 65–99)
Potassium: 4.1 mmol/L (ref 3.5–5.1)
SODIUM: 138 mmol/L (ref 135–145)
Total Bilirubin: 0.8 mg/dL (ref 0.3–1.2)
Total Protein: 6.6 g/dL (ref 6.5–8.1)

## 2015-03-31 LAB — TROPONIN I: Troponin I: <0.03 ng/mL (ref <0.031)

## 2015-03-31 MED ORDER — METOPROLOL SUCCINATE ER 25 MG PO TB24: 12.5000 mg | ORAL_TABLET | Freq: Every day | ORAL | Status: DC

## 2015-03-31 NOTE — Discharge Instructions (Signed)

## 2015-03-31 NOTE — ED Notes (Signed)
Pt reports hr was 47 at pcp's office yesterday so he was taken off of his metoprolol.  Pt says approx 1 hour ago hr was in the 30's.  Pt c/o feeling tired.

## 2015-03-31 NOTE — ED Provider Notes (Signed)
CSN: [id number]     Arrival date & time 03/31/15  1309 History   First MD Initiated Contact with Patient 03/31/15 1315     Chief Complaint  Patient presents with  . Irregular Heart Beat     (Consider location/radiation/quality/duration/timing/severity/associated sxs/prior Treatment) HPI  The patient is a 69 year old male, he has a history of hypothyroidism, hypertension treated with metoprolol. The medical record reflects that the patient has frequent PVCs, he has no history of cardiac disease from an obstructive standpoint, January 2013 had a normal cardiac catheterization which was nonobstructive. He recently had his levothyroxine dose increased from 50-75 g a day, he has had episodes over the last several months where he has had significant weakness and fatigue which is generalized, it occurs on some days and not on others. He reports that yesterday at his doctor's office for a routine visit he had a sinus bradycardia and his metoprolol was stopped, today he tried to take his pulse and found it to be in the 30s, he presents at the request of his physician for further evaluation of this bradycardia. He denies swelling, chest pain, shortness of breath, fever, chills, cough, nausea, vomiting but he did have an episode of diarrhea today.  Past Medical History  Diagnosis Date  . Hypertension   . Thyroid disease   . Hypothyroidism   . Depression   . Frequent PVCs   . NSVT (nonsustained ventricular tachycardia)   . Acute medial meniscus tear of left knee    Past Surgical History  Procedure Laterality Date  . Retinal detachment surgery      left  . Cataract extraction      left  . Knee arthroscopy      left  . Groin surgery  2010    growth removed   . Eye surgery      left-scar tissue  . Knee arthroscopy with medial menisectomy Left 06/27/2014    Procedure: LEFT KNEE ARTHROSCOPY WITH PARTIAL MEDIAL AND LATERAL MENISECTOMIES AND CHONDROPLASTY;  Surgeon: Lorn Junes, MD;  Location:  [place];  Service: Orthopedics;  Laterality: Left;  . Left heart catheterization with coronary angiogram N/A 09/14/2011    Procedure: LEFT HEART CATHETERIZATION WITH CORONARY ANGIOGRAM;  Surgeon: Pixie Casino, MD;  Location: [facility] CATH LAB;  Service: Cardiovascular;  Laterality: N/A;  . Knee arthroscopy with lateral menisectomy Right 03/11/2015    Procedure: RIGHT KNEE ARTHROSCOPY WITH MEDIALAND LATERAL MENISECTOMY;  Surgeon: Elsie Saas, MD;  Location: [place];  Service: Orthopedics;  Laterality: Right;  . Knee arthroscopy with medial menisectomy Right 03/11/2015    Procedure: KNEE ARTHROSCOPY WITH MEDIAL MENISECTOMY;  Surgeon: Elsie Saas, MD;  Location: [place];  Service: Orthopedics;  Laterality: Right;   Family History  Problem Relation Age of Onset  . Asthma Daughter   . Hypertension Mother   . Hypertension Father   . Heart attack Father    History  Substance Use Topics  . Smoking status: Never Smoker   . Smokeless tobacco: Never Used     Comment: 06/10/14 1968- Tried it once and didn't like it- AJ  . Alcohol Use: Yes     Comment: occasionally    Review of Systems  All other systems reviewed and are negative.     Allergies  Penicillins  Home Medications   Prior to Admission medications   Medication Sig Start Date End Date Taking? Authorizing Provider  HYDROcodone-acetaminophen (NORCO) 5-325 MG per tablet 1-2 tablets every 4-6  hrs as needed for pain 03/11/15   Kirstin Shepperson, PA-C  levothyroxine (SYNTHROID, LEVOTHROID) 50 MCG tablet Take 75 mcg by mouth daily.     Historical Provider, MD  metoprolol (TOPROL-XL) 50 MG 24 hr tablet Take 25 mg by mouth daily.      Historical Provider, MD   There were no vitals taken for this visit. Physical Exam  Constitutional: He appears well-developed and well-nourished. No distress.  HENT:  Head: Normocephalic and atraumatic.  Mouth/Throat: Oropharynx is clear and moist. No  oropharyngeal exudate.  Eyes: Conjunctivae and EOM are normal. Pupils are equal, round, and reactive to light. Right eye exhibits no discharge. Left eye exhibits no discharge. No scleral icterus.  Neck: Normal range of motion. Neck supple. No JVD present. No thyromegaly present.  Cardiovascular: Normal heart sounds and intact distal pulses.  Exam reveals no gallop and no friction rub.   No murmur heard. Sinus rhythm, pulse in the 60s, frequent PVCs, sometimes bigeminy, strong pulses, no peripheral edema or JVD  Pulmonary/Chest: Effort normal and breath sounds normal. No respiratory distress. He has no wheezes. He has no rales.  Abdominal: Soft. Bowel sounds are normal. He exhibits no distension and no mass. There is no tenderness.  Musculoskeletal: Normal range of motion. He exhibits no edema or tenderness.  Lymphadenopathy:    He has no cervical adenopathy.  Neurological: He is alert. Coordination normal.  Skin: Skin is warm and dry. No rash noted. No erythema.  Psychiatric: He has a normal mood and affect. His behavior is normal.  Nursing note and vitals reviewed.   ED Course  Procedures (including critical care time) Labs Review Labs Reviewed  TROPONIN I  CBC WITH DIFFERENTIAL/PLATELET  COMPREHENSIVE METABOLIC PANEL    Imaging Review No results found.   EKG Interpretation   Date/Time:  Tuesday March 31 2015 13:18:49 EDT Ventricular Rate:  71 PR Interval:  166 QRS Duration: 90 QT Interval:  450 QTC Calculation: 489 R Axis:   46 Text Interpretation:  Sinus rhythm Multiple ventricular premature  complexes Consider left atrial enlargement Borderline prolonged QT  interval Baseline wander in lead(s) V1 Since last tracing PVC' s now  present. Confirmed by Sabra Heck  MD, [place] ([id number]) on 03/31/2015 1:29:16 PM      MDM   Final diagnoses:  None    The patient is well-appearing, he has frequent PVCs on monitor, his EKG is otherwise unremarkable, will perform electrolyte  workup, cardiac monitoring, discussed with cardiology.  D/w Dr. Domenic Polite - recommends low dose metoprolol XL - pt in agreement - will f/u with Dr. Jacinta Shoe.  Meds given in ED:  Medications - No data to display  New Prescriptions   No medications on file      Noemi Chapel, MD 03/31/15 [phone]

## 2015-04-14 ENCOUNTER — Telehealth: Payer: Self-pay | Admitting: Cardiovascular Disease

## 2015-04-14 NOTE — Telephone Encounter (Signed)
Called by Dr. Kerin Perna. Patient reportedly remains fatigued and possibly bradycardic, although he mentioned PVC's likely not being captured by automated monitor and HR may not be quite as low as patient reports. Dr. Gerarda Fraction plans to stop metoprolol and wanted to inform me.

## 2015-04-14 NOTE — Addendum Note (Signed)
Addended by: Barbarann Ehlers A on: 04/14/2015 10:04 AM   Modules accepted: Orders, Medications

## 2015-04-14 NOTE — Telephone Encounter (Signed)
Metoprolol removed from med list

## 2015-04-20 ENCOUNTER — Encounter: Payer: Self-pay | Admitting: Cardiovascular Disease

## 2015-04-20 ENCOUNTER — Ambulatory Visit (INDEPENDENT_AMBULATORY_CARE_PROVIDER_SITE_OTHER): Payer: Medicare Other | Admitting: Cardiovascular Disease

## 2015-04-20 VITALS — BP 112/78 | HR 67 | Ht 77.0 in | Wt 217.0 lb

## 2015-04-20 DIAGNOSIS — R5383 Other fatigue: Secondary | ICD-10-CM

## 2015-04-20 DIAGNOSIS — E039 Hypothyroidism, unspecified: Secondary | ICD-10-CM | POA: Diagnosis not present

## 2015-04-20 DIAGNOSIS — I10 Essential (primary) hypertension: Secondary | ICD-10-CM | POA: Diagnosis not present

## 2015-04-20 DIAGNOSIS — I4729 Other ventricular tachycardia: Secondary | ICD-10-CM

## 2015-04-20 DIAGNOSIS — I472 Ventricular tachycardia: Secondary | ICD-10-CM

## 2015-04-20 DIAGNOSIS — I493 Ventricular premature depolarization: Secondary | ICD-10-CM | POA: Diagnosis not present

## 2015-04-20 NOTE — Addendum Note (Signed)
Addended by: Barbarann Ehlers A on: 04/20/2015 09:27 AM   Modules accepted: Level of Service

## 2015-04-20 NOTE — Patient Instructions (Addendum)
Your physician wants you to follow-up in: 1 year with Dr Virgina Jock will receive a reminder letter in the mail two months in advance. If you don't receive a letter, please call our office to schedule the follow-up appointment.    STOP Toprol   Return in 2 weeks for EKG     Thank you for choosing [facility] !

## 2015-04-20 NOTE — Progress Notes (Signed)
Patient ID: Micheal Baker, male   DOB: [birth year], 69 y.o.   MRN: [id number]      SUBJECTIVE: The patient presents for followup for frequent PVCs and fatigue.  Echocardiogram in 05/2014 demonstrated normal left ventricular systolic function, EF 51-02%, mild LVH and mild diastolic dysfunction. Holter monitoring in 05/2014 demonstrated normal sinus rhythm with frequent PVCs and short runs of nonsustained ventricular tachycardia. The patient's symptoms correlated with normal sinus rhythm. He underwent a normal coronary angiogram in January 2013.  He was evaluated in the ED in July for palpitations. Toprol XL was decreased to 12.5 mg daily. He then saw his PCP (Dr. Gerarda Fraction, who called me) and due to reported issues of bradycardia, metoprolol succinate was discontinued altogether. However, he now appears to be taking it every other day.  He continues to feel his carotid pulse and tells me it is irregular. I previously explained to him why this would be the case. He denies chest pain. He always feels fatigued but moreso at the time of his ED evaluation. He is also in the process of being evaluated for sleep apnea and low testosterone.   Review of Systems: As per "subjective", otherwise negative.  Allergies  Allergen Reactions  . Penicillins     chills    Current Outpatient Prescriptions  Medication Sig Dispense Refill  . levothyroxine (SYNTHROID, LEVOTHROID) 75 MCG tablet Take 1 tablet by mouth daily.    . metoprolol tartrate (LOPRESSOR) 25 MG tablet Take 12.5 mg by mouth daily.     No current facility-administered medications for this visit.    Past Medical History  Diagnosis Date  . Hypertension   . Thyroid disease   . Hypothyroidism   . Depression   . Frequent PVCs   . NSVT (nonsustained ventricular tachycardia)   . Acute medial meniscus tear of left knee     Past Surgical History  Procedure Laterality Date  . Retinal detachment surgery      left  . Cataract extraction     left  . Knee arthroscopy      left  . Groin surgery  2010    growth removed   . Eye surgery      left-scar tissue  . Knee arthroscopy with medial menisectomy Left 06/27/2014    Procedure: LEFT KNEE ARTHROSCOPY WITH PARTIAL MEDIAL AND LATERAL MENISECTOMIES AND CHONDROPLASTY;  Surgeon: Lorn Junes, MD;  Location: [place];  Service: Orthopedics;  Laterality: Left;  . Left heart catheterization with coronary angiogram N/A 09/14/2011    Procedure: LEFT HEART CATHETERIZATION WITH CORONARY ANGIOGRAM;  Surgeon: Pixie Casino, MD;  Location: [facility] CATH LAB;  Service: Cardiovascular;  Laterality: N/A;  . Knee arthroscopy with lateral menisectomy Right 03/11/2015    Procedure: RIGHT KNEE ARTHROSCOPY WITH MEDIALAND LATERAL MENISECTOMY;  Surgeon: Elsie Saas, MD;  Location: [place];  Service: Orthopedics;  Laterality: Right;  . Knee arthroscopy with medial menisectomy Right 03/11/2015    Procedure: KNEE ARTHROSCOPY WITH MEDIAL MENISECTOMY;  Surgeon: Elsie Saas, MD;  Location: [place];  Service: Orthopedics;  Laterality: Right;    History   Social History  . Marital Status: Divorced    Spouse Name: N/A  . Number of Children: N/A  . Years of Education: N/A   Occupational History  . Part time [employer] work    Social History Main Topics  . Smoking status: Never Smoker   . Smokeless tobacco: Never Used     Comment: 06/10/14 1968- Tried  it once and didn't like it- AJ  . Alcohol Use: 0.0 oz/week    0 Standard drinks or equivalent per week     Comment: occasionally  . Drug Use: No  . Sexual Activity: Not on file   Other Topics Concern  . Not on file   Social History Narrative     Filed Vitals:   04/20/15 0853  BP: 112/78  Pulse: 67  Height: 6\' 5"  (1.956 m)  Weight: 217 lb (98.431 kg)  SpO2: 97%   HR by auscultation: 60 bpm  PHYSICAL EXAM General: NAD HEENT: Normal. Neck: No JVD, no thyromegaly. Lungs: Clear to auscultation  bilaterally with normal respiratory effort. CV: Regular rate and rhythm, normal S1/S2, no S3/S4, no murmur. No pretibial or periankle edema.   Abdomen: Soft, nontender, no distention.  Neurologic: Alert and oriented x 3.  Psych: Normal affect. Skin: Normal. Musculoskeletal: Normal range of motion, no gross deformities. Extremities: No clubbing or cyanosis.   ECG: Most recent ECG reviewed.      ASSESSMENT AND PLAN: 1. Fatigue with frequent PVC's and short runs of NSVT: He is currently in a regular rhythm with no PVCs appreciated by auscultation today. He feels fatigued but is also being worked up for sleep apnea and low testosterone. I will try stopping metoprolol succinate altogether and will obtain an ECG in 2 weeks. If after stopping metoprolol for 2 weeks his symptoms remain the same or get worse, it will show that his symptoms are not due to metoprolol. In that case, I may either restart Toprol-XL 12.5 mg daily or start metoprolol tartrate 12.5 mg daily. If his symptoms actually improve in 2 weeks, I will keep him off metoprolol altogether.  2. Essential HTN: Controlled on present therapy. Will monitor given discontinuation of metoprolol.  Dispo: f/u 6 months.  Kate Sable, M.D., F.A.C.C.

## 2015-05-01 ENCOUNTER — Telehealth: Payer: Self-pay | Admitting: Cardiovascular Disease

## 2015-05-01 NOTE — Telephone Encounter (Signed)
Doesn't sound like symptom complex from stopping beta blocker but perhaps contributing. Will await ECG. Would consider starting metoprolol tartrate 12.5 mg daily but will wait to see ECG. Stiff neck not related to stopping metoprolol. Hold off on exercising until ECG performed, and check BP AFTER ECG is done.

## 2015-05-01 NOTE — Telephone Encounter (Signed)
Explained to PT his symptoms shouldn't be from stopping beta blocker. Told him to hold off on exercising until after EKG and BP check to see where he stands. He voiced understanding.

## 2015-05-01 NOTE — Telephone Encounter (Signed)
Patient wanting to speak with nurse regarding his BP. / tg

## 2015-05-01 NOTE — Telephone Encounter (Signed)
Called PT back and complains of a bad headache for the past 2 days, stiff neck, BP 165/85, real fatigued and states he isn't feeling well overall. He wanted to know are these symptoms coming from where he stopped his Metoprolol last week. He also wanted to know was it safe for him to exercise with his BP trending back up. He is having an EKG on Monday. Please advise

## 2015-05-04 ENCOUNTER — Ambulatory Visit (INDEPENDENT_AMBULATORY_CARE_PROVIDER_SITE_OTHER): Payer: Medicare Other | Admitting: Cardiovascular Disease

## 2015-05-04 VITALS — BP 114/72 | HR 69

## 2015-05-04 DIAGNOSIS — Z9289 Personal history of other medical treatment: Secondary | ICD-10-CM

## 2015-05-04 DIAGNOSIS — I493 Ventricular premature depolarization: Secondary | ICD-10-CM | POA: Diagnosis not present

## 2015-05-04 NOTE — Patient Instructions (Signed)
Continue medications as directed     Thank you for choosing [facility] !        

## 2015-05-04 NOTE — Progress Notes (Signed)
EKG obtained after stopping beta blocker and reviewed by Dr Laurie Panda no change in medications,pt d/c'd home

## 2015-06-10 ENCOUNTER — Encounter: Payer: Self-pay | Admitting: Neurology

## 2015-06-10 ENCOUNTER — Ambulatory Visit (INDEPENDENT_AMBULATORY_CARE_PROVIDER_SITE_OTHER): Payer: Medicare Other | Admitting: Neurology

## 2015-06-10 VITALS — BP 142/90 | HR 80 | Resp 16 | Ht 77.0 in | Wt 216.0 lb

## 2015-06-10 DIAGNOSIS — R351 Nocturia: Secondary | ICD-10-CM | POA: Diagnosis not present

## 2015-06-10 DIAGNOSIS — R0683 Snoring: Secondary | ICD-10-CM | POA: Diagnosis not present

## 2015-06-10 DIAGNOSIS — R51 Headache: Secondary | ICD-10-CM

## 2015-06-10 DIAGNOSIS — E663 Overweight: Secondary | ICD-10-CM

## 2015-06-10 DIAGNOSIS — G4719 Other hypersomnia: Secondary | ICD-10-CM

## 2015-06-10 DIAGNOSIS — R519 Headache, unspecified: Secondary | ICD-10-CM

## 2015-06-10 NOTE — Progress Notes (Signed)
Subjective:    Patient ID: Micheal Baker is a 69 y.o. male.  HPI     Star Age, MD, PhD [facility] [address], [address] [place], [place] [address]  Dear Dr. Gerarda Fraction,   I saw your patient, Micheal Baker, upon your kind request in my neurologic clinic today for initial consultation of his sleep disorder, in particular, concern for underlying obstructive sleep apnea. The patient is unaccompanied today. As you know, Micheal Baker is a 69 year old right-handed gentleman with an underlying medical history of BPH, thyroiditis, hypertension, chest pain, history of PVCs, and overweight state, who reports snoring and excessive daytime somnolence. I reviewed your office note from 04/14/2015, which you kindly included. He had an overnight pulse oximetry test through your office, on 05/04/2015, which I reviewed: Total recording time was 8 hours, 48 minutes, time below 88% saturation was 8 seconds. He had blood work through your office, which I reviewed: CBC with differential was unremarkable, CMP showed normal findings, cortisol level in the morning was within range, cortisol level in the afternoon was within range, folate normal, B12 low at 200.  Of note, he was seen in the emergency room on 03/31/2015 for irregular heartbeat and bradycardia. I reviewed the emergency room records. CMP was unremarkable, troponin negative, CBC with differential was unremarkable as well. He had a echocardiogram on 05/20/14: Mild LVH with LVEF 60-65%, findings consistent with mild diastolic dysfunction. Upper normal left atrial chamber size. Trivial mitral and tricuspid regurgitation. Mild right atrial enlargement.  He had LHC under Dr. Debara Pickett some 3 years ago. He has noted daytime tiredness for the past several months. Symptoms may have been ongoing for a year or so. He had left knee arthroscopic surgery in October 2015 and right knee arthroscopic surgery in June 2016. He was recently started on  testosterone injections. He has seen a urologist because of a borderline elevated PSA in the recent past. He was treated with an antibiotic and his PSA level came down he states. He has just recently started B12 injections and had 2 monthly shots so far. He snores. He has had morning headaches for the past 6 weeks. Symptoms are generalized and bandlike. He denies any throbbing sensation but has had pain behind the left eye. He had cataract surgery and left eye detached retina surgery in the past. He also had scar tissue removal from the left eye in the past. He denies restless leg symptoms but does have some residual knee discomfort and likes to sleep with a pillow between his knees. He does not typically sleep on his back. He has no issues breathing through his nose at night. His bedtime is around 11:30 PM. His rise time is typically between 7:30 and 8 AM. He does not wake up rested. His Epworth sleepiness score is 12 out of 24 today, his fatigue score is 53 out of 63. He sometimes watches TV in bed but does not typically have to do this every night. He drinks alcohol 2-3 times per week in the form of beer. He is retired. 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 He has 2 grown children. He drinks coffee usually 2 cups per day. He does not smoke. He has no family history of OSA. He has nocturia, typically once per night. He does have some sleep disruption he feels.  His Past Medical History Is Significant For: Past Medical History  Diagnosis Date  . Hypertension   .  Thyroid disease   . Hypothyroidism   . Depression   . Frequent PVCs   . NSVT (nonsustained ventricular tachycardia)   . Acute medial meniscus tear of left knee   . Osteoarthritis     His Past Surgical History Is Significant For: Past Surgical History  Procedure Laterality Date  . Retinal detachment surgery      left  . Cataract extraction      left  . Knee arthroscopy      left  . Groin surgery  2010     growth removed   . Eye surgery      left-scar tissue  . Knee arthroscopy with medial menisectomy Left 06/27/2014    Procedure: LEFT KNEE ARTHROSCOPY WITH PARTIAL MEDIAL AND LATERAL MENISECTOMIES AND CHONDROPLASTY;  Surgeon: Lorn Junes, MD;  Location: [facility];  Service: Orthopedics;  Laterality: Left;  . Left heart catheterization with coronary angiogram N/A 09/14/2011    Procedure: LEFT HEART CATHETERIZATION WITH CORONARY ANGIOGRAM;  Surgeon: Pixie Casino, MD;  Location: [facility] CATH LAB;  Service: Cardiovascular;  Laterality: N/A;  . Knee arthroscopy with lateral menisectomy Right 03/11/2015    Procedure: RIGHT KNEE ARTHROSCOPY WITH MEDIALAND LATERAL MENISECTOMY;  Surgeon: Elsie Saas, MD;  Location: [place];  Service: Orthopedics;  Laterality: Right;  . Knee arthroscopy with medial menisectomy Right 03/11/2015    Procedure: KNEE ARTHROSCOPY WITH MEDIAL MENISECTOMY;  Surgeon: Elsie Saas, MD;  Location: [place];  Service: Orthopedics;  Laterality: Right;    His Family History Is Significant For: Family History  Problem Relation Age of Onset  . Asthma Daughter   . Hypertension Mother   . Hypertension Father   . Heart attack Father     His Social History Is Significant For: Social History   Social History  . Marital Status: Divorced    Spouse Name: N/A  . Number of Children: 2  . Years of Education: Masters   Occupational History  . Part time Library work   . Retired    Social History Main Topics  . Smoking status: Never Smoker   . Smokeless tobacco: Never Used     Comment: 06/10/14 1968- Tried it once and didn't like it- AJ  . Alcohol Use: 0.0 oz/week    0 Standard drinks or equivalent per week     Comment: occasionally  . Drug Use: No  . Sexual Activity: Not Asked   Other Topics Concern  . None   Social History Narrative   Patient drinks 2 caffienated drinks a day     His Allergies Are:  Allergies   Allergen Reactions  . Penicillins     chills  :   His Current Medications Are:  Outpatient Encounter Prescriptions as of 06/10/2015  Medication Sig  . levothyroxine (SYNTHROID, LEVOTHROID) 75 MCG tablet Take 1 tablet by mouth daily.  . [DISCONTINUED] aspirin EC 81 MG tablet Take 81 mg by mouth daily.  . [DISCONTINUED] HYDROcodone-acetaminophen (NORCO/VICODIN) 5-325 MG tablet    No facility-administered encounter medications on file as of 06/10/2015.  :  Review of Systems:  Out of a complete 14 point review of systems, all are reviewed and negative with the exception of these symptoms as listed below:   Review of Systems  Constitutional: Positive for fatigue.  HENT: Positive for tinnitus.   Respiratory: Positive for cough and shortness of breath.   Genitourinary: Positive for dysuria.  Musculoskeletal:       Joint pain  Neurological: Positive for headaches.       Trouble falling asleep, sometimes has trouble staying asleep, snoring, wakes up feeling tired in the morning, daytime tiredness especially when not active, morning headaches, tries not to nap during the day.   Hematological: Bruises/bleeds easily.  Psychiatric/Behavioral:       Depression, decreased energy, disinterest in activities     Objective:  Neurologic Exam  Physical Exam Physical Examination:   Filed Vitals:   06/10/15 1515  BP: 142/90  Pulse: 80  Resp: 16   General Examination: The patient is a very pleasant 69 y.o. male in no acute distress. He appears well-developed and well-nourished and well groomed.   HEENT: Normocephalic, atraumatic, pupils are equal, round and reactive to light and accommodation. Funduscopic exam is normal with sharp disc margins noted. Extraocular tracking is good without limitation to gaze excursion or nystagmus noted. Normal smooth pursuit is noted. Hearing is grossly intact. Tympanic membranes are clear bilaterally. Face is symmetric with normal facial animation and normal  facial sensation. Speech is clear with no dysarthria noted. There is no hypophonia. There is no lip, neck/head, jaw or voice tremor. Neck is supple with full range of passive and active motion. There are no carotid bruits on auscultation. Oropharynx exam reveals: mild mouth dryness, good dental hygiene and moderate airway crowding, due to redundant soft palate and larger uvula and narrow airway entry. Mallampati is class II. Tongue protrudes centrally and palate elevates symmetrically. Tonsils are small bilaterally. Neck circumference is 15-3/4 inches. He has a Mild overbite. Nasal inspection reveals no significant nasal mucosal bogginess or redness and no septal deviation.   Chest: Clear to auscultation without wheezing, rhonchi or crackles noted.  Heart: S1+S2+0, regular and normal without murmurs, rubs or gallops noted.   Abdomen: Soft, non-tender and non-distended with normal bowel sounds appreciated on auscultation.  Extremities: There is no pitting edema in the distal lower extremities bilaterally. Pedal pulses are intact.  Skin: Warm and dry without trophic changes noted. There are no varicose veins.  Musculoskeletal: exam reveals no obvious joint deformities, tenderness or joint swelling or erythema.   Neurologically:  Mental status: The patient is awake, alert and oriented in all 4 spheres. His immediate and remote memory, attention, language skills and fund of knowledge are appropriate. There is no evidence of aphasia, agnosia, apraxia or anomia. Speech is clear with normal prosody and enunciation. Thought process is linear. Mood is normal and affect is normal.  Cranial nerves II - XII are as described above under HEENT exam. In addition: shoulder shrug is normal with equal shoulder height noted. Motor exam: Normal bulk, strength and tone is noted. There is no drift, tremor or rebound. Romberg is negative. Reflexes are 2+ throughout. Babinski: Toes are flexor bilaterally. Fine motor skills  and coordination: intact with normal finger taps, normal hand movements, normal rapid alternating patting, normal foot taps and normal foot agility.  Cerebellar testing: No dysmetria or intention tremor on finger to nose testing. Heel to shin is unremarkable bilaterally. There is no truncal or gait ataxia.  Sensory exam: intact to light touch, pinprick, vibration, temperature sense in the upper and lower extremities.  Gait, station and balance: He stands easily. No veering to one side is noted. No leaning to one side is noted. Posture is age-appropriate and stance is narrow based. Gait shows normal stride length and normal pace. No problems turning are noted. He turns en bloc. Tandem walk is unremarkable.  Assessment and Plan:   In  summary, Micheal Baker is a very pleasant 69 y.o.-year old male with an underlying medical history of BPH, thyroiditis, hypertension, chest pain, history of PVCs, and overweight state, who reports snoring and excessive daytime somnolence. In addition, he reports nocturia and recent onset of morning headaches. His history and physical exam do raise concern for obstructive sleep apnea (OSA). I had a long chat with the patient about my findings and the diagnosis of OSA, its prognosis and treatment options. We talked about medical treatments, surgical interventions and non-pharmacological approaches. I explained in particular the risks and ramifications of untreated moderate to severe OSA, especially with respect to developing cardiovascular disease down the Road, including congestive heart failure, difficult to treat hypertension, cardiac arrhythmias, or stroke. Even type 2 diabetes has, in part, been linked to untreated OSA. Symptoms of untreated OSA include daytime sleepiness, memory problems, mood irritability and mood disorder such as depression and anxiety, lack of energy, as well as recurrent headaches, especially morning headaches. We talked about trying to maintain a healthy  lifestyle in general, as well as the importance of weight control. I encouraged the patient to eat healthy, exercise daily and keep well hydrated, to keep a scheduled bedtime and wake time routine, to not skip any meals and eat healthy snacks in between meals. I advised the patient not to drive when feeling sleepy. I recommended the following at this time: sleep study with potential positive airway pressure titration. (We will score hypopneas at 4% and split the sleep study into diagnostic and treatment portion, if the estimated. 2 hour AHI is >20/h).   I explained the sleep test procedure to the patient and also outlined possible surgical and non-surgical treatment options of OSA, including the use of a custom-made dental device (which would require a referral to a specialist dentist or oral surgeon), upper airway surgical options, such as pillar implants, radiofrequency surgery, tongue base surgery, and UPPP (which would involve a referral to an ENT surgeon). Rarely, jaw surgery such as mandibular advancement may be considered.  I also explained the CPAP treatment option to the patient, who indicated that he would be willing to try CPAP if the need arises. I explained the importance of being compliant with PAP treatment, not only for insurance purposes but primarily to improve His symptoms, and for the patient's long term health benefit, including to reduce His cardiovascular risks. I answered all his questions today and the patient was in agreement. I would like to see him back after the sleep study is completed and encouraged him to call with any interim questions, concerns, problems or updates.   Thank you very much for allowing me to participate in the care of this nice patient. If I can be of any further assistance to you please do not hesitate to call me at [phone].  Sincerely,   Star Age, MD, PhD

## 2015-06-10 NOTE — Patient Instructions (Signed)

## 2015-07-09 ENCOUNTER — Telehealth: Payer: Self-pay | Admitting: Neurology

## 2015-07-27 ENCOUNTER — Ambulatory Visit (INDEPENDENT_AMBULATORY_CARE_PROVIDER_SITE_OTHER): Payer: Medicare Other | Admitting: Neurology

## 2015-07-27 DIAGNOSIS — G479 Sleep disorder, unspecified: Secondary | ICD-10-CM

## 2015-07-27 DIAGNOSIS — G4733 Obstructive sleep apnea (adult) (pediatric): Secondary | ICD-10-CM

## 2015-07-27 DIAGNOSIS — G4761 Periodic limb movement disorder: Secondary | ICD-10-CM

## 2015-07-27 DIAGNOSIS — R9431 Abnormal electrocardiogram [ECG] [EKG]: Secondary | ICD-10-CM

## 2015-07-27 DIAGNOSIS — G4720 Circadian rhythm sleep disorder, unspecified type: Secondary | ICD-10-CM

## 2015-07-27 NOTE — Sleep Study (Signed)
Please see the scanned sleep study interpretation located in the Procedure tab within the Chart Review section. 

## 2015-07-30 ENCOUNTER — Telehealth: Payer: Self-pay | Admitting: Neurology

## 2015-07-30 DIAGNOSIS — G4733 Obstructive sleep apnea (adult) (pediatric): Secondary | ICD-10-CM

## 2015-07-30 NOTE — Telephone Encounter (Signed)
Patient referred by Dr. Gerarda Fraction, seen by me on 06/10/15, diagnostic PSG on 07/27/15, ins: [employer].    Please call and notify the patient that the recent sleep study did confirm the diagnosis of obstructive sleep apnea. OSA is overall mild (to moderate), but worth treating to see if he feels better after treatment. To that end I recommend treatment for this in the form of autoPAP, which means, that we don't have to bring him back for a second sleep study with CPAP, but will let him try an autoPAP machine at home, through a DME company (of his choice, or as per insurance requirement). The DME representative will educate him on how to use the machine, how to put the mask on, etc. I have placed an order in the chart. Please send referral, talk to patient, send report to PCP and Cardiologist, Dr. Raliegh Ip. We will need a FU in sleep clinic for 8 to 10 weeks post-PAP set up, please arrange that as well. Thanks,   Star Age, MD, PhD [facility])

## 2015-08-03 NOTE — Telephone Encounter (Signed)
I spoke to patient and he is aware of results and recommendations. He asked for me to mail copy of RX to him so that his son can take a look at it due to insurance concerns. I explained to him our processes and answered his questions. I advised him that if it does not work out I can send the order to [facility] (which he currently uses for medications). He voiced understanding. I will fax report to PCP.

## 2015-11-09 ENCOUNTER — Ambulatory Visit (HOSPITAL_COMMUNITY)
Admission: RE | Admit: 2015-11-09 | Discharge: 2015-11-09 | Disposition: A | Discharge status: Home / Self Care | Payer: Medicare Other | Source: Ambulatory Visit | Attending: Internal Medicine | Admitting: Internal Medicine

## 2015-11-09 ENCOUNTER — Other Ambulatory Visit (HOSPITAL_COMMUNITY): Payer: Self-pay | Admitting: Internal Medicine

## 2015-11-09 DIAGNOSIS — R05 Cough: Secondary | ICD-10-CM

## 2015-11-09 DIAGNOSIS — R059 Cough, unspecified: Secondary | ICD-10-CM

## 2015-11-09 DIAGNOSIS — Z1389 Encounter for screening for other disorder: Secondary | ICD-10-CM | POA: Insufficient documentation

## 2015-11-11 NOTE — Telephone Encounter (Signed)
error 

## 2015-11-19 ENCOUNTER — Telehealth: Payer: Self-pay | Admitting: Cardiovascular Disease

## 2015-11-19 NOTE — Telephone Encounter (Signed)
Spoke to the patient because he has a recall in for a 1 year follow-up. The patient stated that he thought he only needed an appointment as needed since he was no longer on any medications that Dr. Bronson Ing ordered for him.  I sent an in-basket message to Dr. Bronson Ing for verification and Dr. Bronson Ing replied:  Can just have him f/u prn.    I deleted the recall & notified the patient.

## 2016-03-16 ENCOUNTER — Telehealth: Payer: Self-pay

## 2016-03-16 NOTE — Telephone Encounter (Signed)
[phone] PATIENT RECEIVED LETTER TO SCHEDULE TCS

## 2016-03-17 NOTE — Telephone Encounter (Signed)
Called and triaged pt. He will call back mid week next week to get exact date/time.

## 2016-03-23 NOTE — Telephone Encounter (Signed)
Pt called back and needed 04/08/2016. He has been scheduled with Dr. Oneida Alar.  See triage.

## 2016-03-23 NOTE — Addendum Note (Signed)
Addended by: Everardo All on: 03/23/2016 11:50 AM   Modules accepted: Medications

## 2016-03-23 NOTE — Telephone Encounter (Signed)
Gastroenterology Pre-Procedure Review  Request Date: 03/22/2016 Requesting Physician: Dr. Gerarda Fraction  PATIENT REVIEW QUESTIONS: The patient responded to the following health history questions as indicated:    1. Diabetes Melitis: no 2. Joint replacements in the past 12 months: no 3. Major health problems in the past 3 months: no 4. Has an artificial valve or MVP: no 5. Has a defibrillator: no 6. Has been advised in past to take antibiotics in advance of a procedure like teeth cleaning: no 7. Family history of colon cancer: no  8. Alcohol Use: occasionally 9. History of sleep apnea: Yes/ Has C-Pap MEDICATIONS & ALLERGIES:    Patient reports the following regarding taking any blood thinners:   Plavix? no Aspirin? no Coumadin? no  Patient confirms/reports the following medications:  Current Outpatient Prescriptions  Medication Sig Dispense Refill  . levothyroxine (SYNTHROID, LEVOTHROID) 75 MCG tablet Take 1 tablet by mouth daily.     No current facility-administered medications for this visit.    Patient confirms/reports the following allergies:  Allergies  Allergen Reactions  . Penicillins     chills    No orders of the defined types were placed in this encounter.    AUTHORIZATION INFORMATION Primary Insurance:   ID #:   Group #:  Pre-Cert / Auth required:  Pre-Cert / Auth #:   Secondary Insurance:   ID #:   Group #: Pre-Cert / Auth required:  Pre-Cert / Auth #:   SCHEDULE INFORMATION: Procedure has been scheduled as follows:  Date:  04/08/2016              Time: 11:15 Am  Location: [facility] Short Stay  This Gastroenterology Pre-Precedure Review Form is being routed to the following provider(s): Barney Drain, MD

## 2016-03-29 ENCOUNTER — Other Ambulatory Visit: Payer: Self-pay

## 2016-03-29 DIAGNOSIS — Z1211 Encounter for screening for malignant neoplasm of colon: Secondary | ICD-10-CM

## 2016-03-29 MED ORDER — NA SULFATE-K SULFATE-MG SULF 17.5-3.13-1.6 GM/177ML PO SOLN: 1.0000 | ORAL | Status: DC

## 2016-03-29 NOTE — Telephone Encounter (Signed)
SUPREP SPLIT DOSING- FULL LIQUIDS WITH BREAKFAST. 

## 2016-03-29 NOTE — Addendum Note (Signed)
Addended by: Everardo All on: 03/29/2016 10:46 AM   Modules accepted: Orders

## 2016-03-29 NOTE — Telephone Encounter (Signed)
Rx sent to the pharmacy and instructions were mailed to pt.

## 2016-03-30 ENCOUNTER — Telehealth: Payer: Self-pay

## 2016-03-30 NOTE — Telephone Encounter (Signed)
I called [employer] at [phone] and spoke to Nance who said that a PA is not required for the screening colonoscopy.

## 2016-04-08 ENCOUNTER — Encounter (HOSPITAL_COMMUNITY)
Admission: RE | Disposition: A | Discharge status: Home / Self Care | Payer: Self-pay | Source: Ambulatory Visit | Attending: Gastroenterology

## 2016-04-08 ENCOUNTER — Ambulatory Visit (HOSPITAL_COMMUNITY)
Admission: RE | Admit: 2016-04-08 | Discharge: 2016-04-08 | Disposition: A | Discharge status: Home / Self Care | Payer: Medicare Other | Source: Ambulatory Visit | Attending: Gastroenterology | Admitting: Gastroenterology

## 2016-04-08 ENCOUNTER — Encounter (HOSPITAL_COMMUNITY): Payer: Self-pay

## 2016-04-08 DIAGNOSIS — I10 Essential (primary) hypertension: Secondary | ICD-10-CM | POA: Insufficient documentation

## 2016-04-08 DIAGNOSIS — Z880 Allergy status to penicillin: Secondary | ICD-10-CM | POA: Diagnosis not present

## 2016-04-08 DIAGNOSIS — D124 Benign neoplasm of descending colon: Secondary | ICD-10-CM | POA: Diagnosis not present

## 2016-04-08 DIAGNOSIS — E039 Hypothyroidism, unspecified: Secondary | ICD-10-CM | POA: Diagnosis not present

## 2016-04-08 DIAGNOSIS — K635 Polyp of colon: Secondary | ICD-10-CM

## 2016-04-08 DIAGNOSIS — Z79899 Other long term (current) drug therapy: Secondary | ICD-10-CM | POA: Insufficient documentation

## 2016-04-08 DIAGNOSIS — K648 Other hemorrhoids: Secondary | ICD-10-CM | POA: Diagnosis not present

## 2016-04-08 DIAGNOSIS — Z1211 Encounter for screening for malignant neoplasm of colon: Secondary | ICD-10-CM

## 2016-04-08 DIAGNOSIS — D120 Benign neoplasm of cecum: Secondary | ICD-10-CM | POA: Diagnosis not present

## 2016-04-08 DIAGNOSIS — F329 Major depressive disorder, single episode, unspecified: Secondary | ICD-10-CM | POA: Insufficient documentation

## 2016-04-08 DIAGNOSIS — M1990 Unspecified osteoarthritis, unspecified site: Secondary | ICD-10-CM | POA: Insufficient documentation

## 2016-04-08 HISTORY — PX: POLYPECTOMY: SHX5525

## 2016-04-08 HISTORY — PX: COLONOSCOPY: SHX5424

## 2016-04-08 SURGERY — COLONOSCOPY
Anesthesia: Moderate Sedation

## 2016-04-08 MED ORDER — MIDAZOLAM HCL 5 MG/5ML IJ SOLN
INTRAMUSCULAR | Status: DC | PRN
  Administered 2016-04-08 (×2): 2 mg via INTRAVENOUS
  Administered 2016-04-08: 1 mg via INTRAVENOUS

## 2016-04-08 MED ORDER — MEPERIDINE HCL 100 MG/ML IJ SOLN
INTRAMUSCULAR | Status: DC | PRN
  Administered 2016-04-08 (×2): 25 mg via INTRAVENOUS
  Administered 2016-04-08: 50 mg via INTRAVENOUS

## 2016-04-08 MED ORDER — MEPERIDINE HCL 100 MG/ML IJ SOLN
INTRAMUSCULAR | Status: AC
  Filled 2016-04-08: qty 2

## 2016-04-08 MED ORDER — MIDAZOLAM HCL 5 MG/5ML IJ SOLN
INTRAMUSCULAR | Status: AC
  Filled 2016-04-08: qty 10

## 2016-04-08 MED ORDER — SIMETHICONE 40 MG/0.6ML PO SUSP
ORAL | Status: DC | PRN
  Administered 2016-04-08: 13:00:00

## 2016-04-08 MED ORDER — SODIUM CHLORIDE 0.9 % IV SOLN
INTRAVENOUS | Status: DC
  Administered 2016-04-08: 11:00:00 via INTRAVENOUS

## 2016-04-08 NOTE — Op Note (Signed)
[facility] Patient Name: Micheal Baker Procedure Date: 04/08/2016 1:21 PM MRN: [id number] Date of Birth: August 24, 1946 Attending MD: Barney Drain , MD CSN: [id number] Age: 70 Admit Type: Outpatient Procedure:                Colonoscopy WITH COLD FORCEPS/SNARE CAUTERY                            POLYPECTOMY Indications:              Screening for colorectal malignant neoplasm Providers:                Barney Drain, MD, [place]is Peak B. Sharon Seller, RN, [place] N[employer].                            Tina Griffiths, Technician Referring MD:             [employer], MD Medicines:                Meperidine 100 mg IV, Midazolam 5 mg IV Complications:            No immediate complications. Estimated Blood Loss:     Estimated blood loss was minimal. Procedure:                Pre-Anesthesia Assessment:                           - Prior to the procedure, a History and Physical                            was performed, and patient medications and                            allergies were reviewed. The patient's tolerance of                            previous anesthesia was also reviewed. The risks                            and benefits of the procedure and the sedation                            options and risks were discussed with the patient.                            All questions were answered, and informed consent                            was obtained. Prior Anticoagulants: The patient has                            taken no previous anticoagulant or antiplatelet                            agents. ASA Grade Assessment: I - A normal, healthy  patient. After reviewing the risks and benefits,                            the patient was deemed in satisfactory condition to                            undergo the procedure. After obtaining informed                            consent, the colonoscope was passed under direct                            vision. Throughout the procedure, the  patient's                            blood pressure, pulse, and oxygen saturations were                            monitored continuously. The EC-[id number]                            scope was introduced through the anus and advanced                            to the the cecum, identified by appendiceal orifice                            and ileocecal valve. The ileocecal valve,                            appendiceal orifice, and rectum were photographed.                            The colonoscopy was somewhat difficult due to a                            tortuous colon. Successful completion of the                            procedure was aided by COLOWRAP. The patient                            tolerated the procedure well. The quality of the                            bowel preparation was excellent. Scope In: 1:42:59 PM Scope Out: 1:58:19 PM Scope Withdrawal Time: 0 hours 12 minutes 12 seconds  Total Procedure Duration: 0 hours 15 minutes 20 seconds  Findings:      A 6 mm polyp was found in the cecum. The polyp was sessile. The polyp       was removed with a cold biopsy forceps. The polyp was removed with a hot       snare. Resection and retrieval were complete.      A  4 mm polyp was found in the descending colon. The polyp was sessile.       The polyp was removed with a cold biopsy forceps. Resection and       retrieval were complete.      Non-bleeding internal hemorrhoids were found. The hemorrhoids were small. Impression:               - One 6 mm polyp in the cecum, removed with a cold                            biopsy forceps and removed with a hot snare.                           - One 4 mm polyp in the descending colon, removed                            with a cold biopsy forceps.                           - Non-bleeding internal hemorrhoids. Moderate Sedation:      Moderate (conscious) sedation was administered by the endoscopy nurse       and supervised by the  endoscopist. The following parameters were       monitored: oxygen saturation, heart rate, blood pressure, and response       to care. Total physician intraservice time was 31 minutes. Recommendation:           - High fiber diet.                           - Continue present medications.                           - Await pathology results.                           - Repeat colonoscopy in 5-10 years for surveillance.                           - Patient has a contact number available for                            emergencies. The signs and symptoms of potential                            delayed complications were discussed with the                            patient. Return to normal activities tomorrow.                            Written discharge instructions were provided to the                            patient. Procedure Code(s):        --- Professional ---  [phone], Colonoscopy, flexible; with removal of                            tumor(s), polyp(s), or other lesion(s) by snare                            technique                           45380, 59, Colonoscopy, flexible; with biopsy,                            single or multiple                           99152, Moderate sedation services provided by the                            same physician or other qualified health care                            professional performing the diagnostic or                            therapeutic service that the sedation supports,                            requiring the presence of an independent trained                            observer to assist in the monitoring of the                            patient's level of consciousness and physiological                            status; initial 15 minutes of intraservice time,                            patient age 37 years or older                           [phone], Moderate sedation services; each additional                             15 minutes intraservice time Diagnosis Code(s):        --- Professional ---                           Z12.11, Encounter for screening for malignant                            neoplasm of colon                           D12.0, Benign neoplasm of cecum  D12.4, Benign neoplasm of descending colon                           K64.8, Other hemorrhoids CPT copyright 2016 [employer]. All rights reserved. The codes documented in this report are preliminary and upon coder review may  be revised to meet current compliance requirements. Barney Drain, MD Barney Drain, MD 04/08/2016 9:01:27 PM This report has been signed electronically. Number of Addenda: 0

## 2016-04-08 NOTE — Discharge Instructions (Addendum)
You had 2 polyps removed. You have small internal AND MODERATE SIZE EXTERNAL hemorrhoids.   FOLLOW A HIGH FIBER DIET. AVOID ITEMS THAT CAUSE BLOATING & GAS. SEE INFO BELOW.  YOUR BIOPSY RESULTS WILL BE AVAILABLE IN MY CHART AFTER AUG  1 AND MY OFFICE WILL CONTACT YOU IN 10-14 DAYS WITH YOUR RESULTS.   Next colonoscopy in 5-10 years.    Colonoscopy Care After Read the instructions outlined below and refer to this sheet in the next week. These discharge instructions provide you with general information on caring for yourself after you leave the hospital. While your treatment has been planned according to the most current medical practices available, unavoidable complications occasionally occur. If you have any problems or questions after discharge, call DR. Marialice Newkirk, [phone].  ACTIVITY  You may resume your regular activity, but move at a slower pace for the next 24 hours.   Take frequent rest periods for the next 24 hours.   Walking will help get rid of the air and reduce the bloated feeling in your belly (abdomen).   No driving for 24 hours (because of the medicine (anesthesia) used during the test).   You may shower.   Do not sign any important legal documents or operate any machinery for 24 hours (because of the anesthesia used during the test).    NUTRITION  Drink plenty of fluids.   You may resume your normal diet as instructed by your doctor.   Begin with a light meal and progress to your normal diet. Heavy or fried foods are harder to digest and may make you feel sick to your stomach (nauseated).   Avoid alcoholic beverages for 24 hours or as instructed.    MEDICATIONS  You may resume your normal medications.   WHAT YOU CAN EXPECT TODAY  Some feelings of bloating in the abdomen.   Passage of more gas than usual.   Spotting of blood in your stool or on the toilet paper  .  IF YOU HAD POLYPS REMOVED DURING THE COLONOSCOPY:  Eat a soft diet IF YOU HAVE  NAUSEA, BLOATING, ABDOMINAL PAIN, OR VOMITING.    FINDING OUT THE RESULTS OF YOUR TEST Not all test results are available during your visit. DR. Oneida Alar WILL CALL YOU WITHIN 14 DAYS OF YOUR PROCEDUE WITH YOUR RESULTS. Do not assume everything is normal if you have not heard from DR. Hakeem Frazzini, CALL HER OFFICE AT [phone].  SEEK IMMEDIATE MEDICAL ATTENTION AND CALL THE OFFICE: [phone] IF:  You have more than a spotting of blood in your stool.   Your belly is swollen (abdominal distention).   You are nauseated or vomiting.   You have a temperature over 101F.   You have abdominal pain or discomfort that is severe or gets worse throughout the day.   High-Fiber Diet A high-fiber diet changes your normal diet to include more whole grains, legumes, fruits, and vegetables. Changes in the diet involve replacing refined carbohydrates with unrefined foods. The calorie level of the diet is essentially unchanged. The Dietary Reference Intake (recommended amount) for adult males is 38 grams per day. For adult females, it is 25 grams per day. Pregnant and lactating women should consume 28 grams of fiber per day. Fiber is the intact part of a plant that is not broken down during digestion. Functional fiber is fiber that has been isolated from the plant to provide a beneficial effect in the body. PURPOSE  Increase stool bulk.   Ease and regulate bowel movements.  Lower cholesterol.   REDUCE RISK OF COLON CANCER  INDICATIONS THAT YOU NEED MORE FIBER  Constipation and hemorrhoids.   Uncomplicated diverticulosis (intestine condition) and irritable bowel syndrome.   Weight management.   As a protective measure against hardening of the arteries (atherosclerosis), diabetes, and cancer.  INCLUDE A VARIETY OF FIBER SOURCES  Replace refined and processed grains with whole grains, canned fruits with fresh fruits, and incorporate other fiber sources. White rice, white breads, and most bakery  goods contain little  or no fiber.   Brown whole-grain rice, buckwheat oats, and many fruits and vegetables are all good sources of fiber. These include: broccoli, Brussels sprouts, cabbage, cauliflower, beets, sweet potatoes, white potatoes (skin on), carrots, tomatoes, eggplant, squash, berries, fresh fruits, and dried fruits.   Cereals appear to be the richest source of fiber. Cereal fiber is found in whole grains and bran. Bran is the fiber-rich outer coat of cereal grain, which is largely removed in refining. In whole-grain cereals, the bran remains. In breakfast cereals, the largest amount of fiber is found in those with "bran" in their names. The fiber content is sometimes indicated on the label.   You may need to include additional fruits and vegetables each day.   In baking, for 1 cup white flour, you may use the following substitutions:   1 cup whole-wheat flour minus 2 tablespoons.   1/2 cup white flour plus 1/2 cup whole-wheat flour.   GUIDELINES FOR INCREASING FIBER IN THE DIET  Start adding fiber to the diet slowly. A gradual increase of about 5 more grams (2 slices of whole-wheat bread, 2 servings of most fruits or vegetables, or 1 bowl of high-fiber cereal) per day is best. Too rapid an increase in fiber may result in constipation, flatulence, and bloating.   Drink enough water and fluids to keep your urine clear or pale yellow. Water, juice, or caffeine-free drinks are recommended. Not drinking enough fluid may cause constipation.   Eat a variety of high-fiber foods rather than one type of fiber.   Try to increase your intake of fiber through using high-fiber foods rather than fiber pills or supplements that contain small amounts of fiber.   The goal is to change the types of food eaten. Do not supplement your present diet with high-fiber foods, but replace foods in your present diet.     Polyps, Colon  A polyp is extra tissue that grows inside your body. Colon polyps  grow in the large intestine. The large intestine, also called the colon, is part of your digestive system. It is a long, hollow tube at the end of your digestive tract where your body makes and stores stool. Most polyps are not dangerous. They are benign. This means they are not cancerous. But over time, some types of polyps can turn into cancer. Polyps that are smaller than a pea are usually not harmful. But larger polyps could someday become or may already be cancerous. To be safe, doctors remove all polyps and test them.   PREVENTION There is not one sure way to prevent polyps. You might be able to lower your risk of getting them if you:  Eat more fruits and vegetables and less fatty food.   Do not smoke.   Avoid alcohol.   Exercise every day.   Lose weight if you are overweight.   Eating more calcium and folate can also lower your risk of getting polyps. Some foods that are rich in calcium are milk, cheese,  and broccoli. Some foods that are rich in folate are chickpeas, kidney beans, and spinach.   Hemorrhoids Hemorrhoids are dilated (enlarged) veins around the rectum. Sometimes clots will form in the veins. This makes them swollen and painful. These are called thrombosed hemorrhoids. Causes of hemorrhoids include:  Constipation.   Straining to have a bowel movement.   HEAVY LIFTING  HOME CARE INSTRUCTIONS  Eat a well balanced diet and drink 6 to 8 glasses of water every day to avoid constipation. You may also use a bulk laxative.   Avoid straining to have bowel movements.   Keep anal area dry and clean.   Do not use a donut shaped pillow or sit on the toilet for long periods. This increases blood pooling and pain.   Move your bowels when your body has the urge; this will require less straining and will decrease pain and pressure.

## 2016-04-08 NOTE — H&P (Signed)
Primary Care Physician:  [facility] Primary Gastroenterologist:  Dr. Oneida Alar  Pre-Procedure History & Physical: HPI:  Micheal Baker is a 70 y.o. male here for [place].  Past Medical History:  Diagnosis Date  . Acute medial meniscus tear of left knee   . Depression   . Frequent PVCs   . Hypertension   . Hypothyroidism   . NSVT (nonsustained ventricular tachycardia) ([place])   . Osteoarthritis   . Thyroid disease     Past Surgical History:  Procedure Laterality Date  . CATARACT EXTRACTION     left  . EYE SURGERY     left-scar tissue  . groin surgery  2010   growth removed   . KNEE ARTHROSCOPY     left  . KNEE ARTHROSCOPY WITH LATERAL MENISECTOMY Right 03/11/2015   Procedure: RIGHT KNEE ARTHROSCOPY WITH MEDIALAND LATERAL MENISECTOMY;  Surgeon: Elsie Saas, MD;  Location: [place];  Service: Orthopedics;  Laterality: Right;  . KNEE ARTHROSCOPY WITH MEDIAL MENISECTOMY Left 06/27/2014   Procedure: LEFT KNEE ARTHROSCOPY WITH PARTIAL MEDIAL AND LATERAL MENISECTOMIES AND CHONDROPLASTY;  Surgeon: Lorn Junes, MD;  Location: [place];  Service: Orthopedics;  Laterality: Left;  . KNEE ARTHROSCOPY WITH MEDIAL MENISECTOMY Right 03/11/2015   Procedure: KNEE ARTHROSCOPY WITH MEDIAL MENISECTOMY;  Surgeon: Elsie Saas, MD;  Location: [place];  Service: Orthopedics;  Laterality: Right;  . LEFT HEART CATHETERIZATION WITH CORONARY ANGIOGRAM N/A 09/14/2011   Procedure: LEFT HEART CATHETERIZATION WITH CORONARY ANGIOGRAM;  Surgeon: Pixie Casino, MD;  Location: [facility] C[facility] LAB;  Service: Cardiovascular;  Laterality: N/A;  . RETINAL DETACHMENT SURGERY     left    Prior to Admission medications   Medication Sig Start Date End Date Taking? Authorizing Provider  levothyroxine (SYNTHROID, LEVOTHROID) 75 MCG tablet Take 1 tablet by mouth daily. 03/27/15  Yes Historical Provider, MD  Na Sulfate-K Sulfate-Mg Sulf  (SUPREP BOWEL PREP KIT) 17.5-3.13-1.6 GM/180ML SOLN Take 1 kit by mouth as directed. 03/29/16  Yes Danie Binder, MD    Allergies as of 03/29/2016 - Review Complete 03/23/2016  Allergen Reaction Noted  . Penicillins  08/08/2011    Family History  Problem Relation Age of Onset  . Hypertension Mother   . Hypertension Father   . Heart attack Father   . Asthma Daughter     Social History   Social History  . Marital status: Divorced    Spouse name: N/A  . Number of children: 2  . Years of education: Masters   Occupational History  . Part time Library work Retired  . Retired    Social History Main Topics  . Smoking status: Never Smoker  . Smokeless tobacco: Never Used     Comment: 06/10/14 1968- Tried it once and didn't like it- AJ  . Alcohol use 0.0 oz/week     Comment: occasionally  . Drug use: No  . Sexual activity: Not on file   Other Topics Concern  . Not on file   Social History Narrative   Patient drinks 2 caffienated drinks a day     Review of Systems: See HPI, otherwise negative ROS   Physical Exam: BP (!) 152/85   Pulse (!) 59   Temp 97.7 F (36.5 C) (Oral)   Resp 10   Ht '6\' 5"'  (1.956 m)   Wt 205 lb (93 kg)   SpO2 100%   BMI 24.31 kg/m  General:   Alert,  pleasant  and cooperative in NAD Head:  Normocephalic and atraumatic. Neck:  Supple; Lungs:  Clear throughout to auscultation.    Heart:  Regular rate and rhythm. Abdomen:  Soft, nontender and nondistended. Normal bowel sounds, without guarding, and without rebound.   Neurologic:  Alert and  oriented x4;  grossly normal neurologically.  Impression/Plan:     SCREENING  Plan:  1. TCS TODAY. DISCUSSED PROCEDURE, BENEFITS, & RISKS: < 1% chance of medication reaction, bleeding, perforation, or rupture of spleen/liver.

## 2016-04-12 ENCOUNTER — Encounter (HOSPITAL_COMMUNITY): Payer: Self-pay | Admitting: Gastroenterology

## 2016-04-19 ENCOUNTER — Telehealth: Payer: Self-pay | Admitting: Gastroenterology

## 2016-04-19 NOTE — Telephone Encounter (Signed)
Reminder in epic °

## 2016-04-19 NOTE — Telephone Encounter (Signed)
Please call pt. He had HYPERPLASTIC POLYPS removed.   FOLLOW A HIGH FIBER DIET. AVOID ITEMS THAT CAUSE BLOATING & GAS.   Next colonoscopy in 10 years.

## 2016-04-20 NOTE — Telephone Encounter (Signed)
Pt is aware of results. 

## 2016-04-20 NOTE — Telephone Encounter (Signed)
LMOM to call.

## 2016-11-24 ENCOUNTER — Encounter: Payer: Self-pay | Admitting: Neurology

## 2017-01-24 ENCOUNTER — Encounter: Payer: Self-pay | Admitting: Neurology

## 2017-01-24 ENCOUNTER — Ambulatory Visit (INDEPENDENT_AMBULATORY_CARE_PROVIDER_SITE_OTHER): Payer: Medicare Other | Admitting: Neurology

## 2017-01-24 VITALS — BP 140/80 | HR 65 | Ht 77.0 in | Wt 203.2 lb

## 2017-01-24 DIAGNOSIS — R51 Headache: Secondary | ICD-10-CM | POA: Diagnosis not present

## 2017-01-24 DIAGNOSIS — R42 Dizziness and giddiness: Secondary | ICD-10-CM | POA: Diagnosis not present

## 2017-01-24 DIAGNOSIS — R519 Headache, unspecified: Secondary | ICD-10-CM

## 2017-01-24 NOTE — Progress Notes (Signed)
NEUROLOGY CONSULTATION NOTE  Micheal Baker MRN: [id number] DOB: 03-22-1946  Referring provider: Dr. Hilma Favors Primary care provider: Dr. Hilma Favors  Reason for consult:  lightheadedness  HISTORY OF PRESENT ILLNESS: Micheal Baker is a 71 year old male with PVCs, OSA on CPAP, elevated PSA and hypothyroidism who presents for lightheadedness.  Beginning in the summer of 2017, he developed headaches.  He has no history of headaches.  Location varies (sometimes in back of head, temples, top of head or behind left eye).  They are usually dull 3/10 aching pain that is manageable.  Most of the time, he doesn't notice it as long as he is active.  On a couple of occasions, he needed to rest in a dark quiet room but otherwise no photophobia, phonophobia, nausea, or visual disturbance.  They usually last several minutes.  Frequency varies.  Headaches may occur for 4 to 5 days in a row every 4 to 5 days.  He denies any particular triggers.  On two occasions, he had a severe 8/10 vice-like headache.  Sometimes he takes an ibuprofen, but mostly lets it resolve on its own.  In October 2017, he developed a positional dizziness, described as a rocking sensation when he would stand up.    In February, he had a two week period where he would get episodes of lightheadedness.  It was a sensation of near syncope beginning in his chest and rising to his head.  There was no associated headache, palpitations, diaphoresis, epigastric rising, feeling hot, tunnel vision, numbness or lateralizing symptoms.  It would last few minutes and then resolve.  It was spontaneous and not positional. It would occur suddenly when just sitting.  It occurred off and on for about 2 weeks and then resolved.  Last month, however, he had another episode lasting about 10 seconds.  He has no history of seizures, but does have history of PVCs.  PAST MEDICAL HISTORY: Past Medical History:  Diagnosis Date  . Acute medial meniscus tear of left knee     . Depression   . Frequent PVCs   . Hypertension   . Hypothyroidism   . NSVT (nonsustained ventricular tachycardia) ([place])   . Osteoarthritis   . Thyroid disease     PAST SURGICAL HISTORY: Past Surgical History:  Procedure Laterality Date  . CATARACT EXTRACTION     left  . COLONOSCOPY N/A 04/08/2016   Procedure: COLONOSCOPY;  Surgeon: Danie Binder, MD;  Location: [facility];  Service: Endoscopy;  Laterality: N/A;  11:15 AM  . EYE SURGERY     left-scar tissue  . groin surgery  2010   growth removed   . KNEE ARTHROSCOPY     left  . KNEE ARTHROSCOPY WITH LATERAL MENISECTOMY Right 03/11/2015   Procedure: RIGHT KNEE ARTHROSCOPY WITH MEDIALAND LATERAL MENISECTOMY;  Surgeon: Elsie Saas, MD;  Location: [facility];  Service: Orthopedics;  Laterality: Right;  . KNEE ARTHROSCOPY WITH MEDIAL MENISECTOMY Left 06/27/2014   Procedure: LEFT KNEE ARTHROSCOPY WITH PARTIAL MEDIAL AND LATERAL MENISECTOMIES AND CHONDROPLASTY;  Surgeon: Lorn Junes, MD;  Location: [facility];  Service: Orthopedics;  Laterality: Left;  . KNEE ARTHROSCOPY WITH MEDIAL MENISECTOMY Right 03/11/2015   Procedure: KNEE ARTHROSCOPY WITH MEDIAL MENISECTOMY;  Surgeon: Elsie Saas, MD;  Location: [facility];  Service: Orthopedics;  Laterality: Right;  . LEFT HEART CATHETERIZATION WITH CORONARY ANGIOGRAM N/A 09/14/2011   Procedure: LEFT HEART CATHETERIZATION WITH CORONARY ANGIOGRAM;  Surgeon: Pixie Casino,  MD;  Location: [facility];  Service: Cardiovascular;  Laterality: N/A;  . POLYPECTOMY  04/08/2016   Procedure: POLYPECTOMY;  Surgeon: Danie Binder, MD;  Location: [facility];  Service: Endoscopy;;  colon   . RETINAL DETACHMENT SURGERY     left    MEDICATIONS: Current Outpatient Prescriptions on File Prior to Visit  Medication Sig Dispense Refill  . levothyroxine (SYNTHROID, LEVOTHROID) 75 MCG tablet Take 1 tablet by mouth daily.     No current  facility-administered medications on file prior to visit.     ALLERGIES: Allergies  Allergen Reactions  . Penicillins     chills    FAMILY HISTORY: Family History  Problem Relation Age of Onset  . Hypertension Mother   . Hypertension Father   . Heart attack Father   . Asthma Daughter     SOCIAL HISTORY: Social History   Social History  . Marital status: Divorced    Spouse name: N/A  . Number of children: 2  . Years of education: Masters   Occupational History  . 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 Retired    Social History Main Topics  . Smoking status: Never Smoker  . Smokeless tobacco: Never Used     Comment: 06/10/14 1968- Tried it once and didn't like it- AJ  . Alcohol use 0.0 oz/week     Comment: occasionally  . Drug use: No  . Sexual activity: Not on file   Other Topics Concern  . Not on file   Social History Narrative   Patient drinks 2 caffienated drinks a day.  Lives alone in a one story home.  Has 2 children.  Retired from [employer].  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  Education: Oceanographer.      REVIEW OF SYSTEMS: Constitutional: No fevers, chills, or sweats, no generalized fatigue, change in appetite Eyes: No visual changes, double vision, eye pain Ear, nose and throat: No hearing loss, ear pain, nasal congestion, sore throat Cardiovascular: No chest pain, palpitations Respiratory:  No shortness of breath at rest or with exertion, wheezes GastrointestinaI: No nausea, vomiting, diarrhea, abdominal pain, fecal incontinence Genitourinary:  No dysuria, urinary retention or frequency Musculoskeletal:  No neck pain, back pain Integumentary: No rash, pruritus, skin lesions Neurological: as above Psychiatric: No depression, insomnia, anxiety Endocrine: No palpitations, fatigue, diaphoresis, mood swings, change in appetite, change in weight, increased thirst Hematologic/Lymphatic:  No purpura,  petechiae. Allergic/Immunologic: no itchy/runny eyes, nasal congestion, recent allergic reactions, rashes  PHYSICAL EXAM: Vitals:   01/24/17 0953  BP: 140/80  Pulse: 65   General: No acute distress.  Patient appears well-groomed.  Head:  Normocephalic/atraumatic Eyes:  fundi examined but not visualized Neck: supple, no paraspinal tenderness, full range of motion Back: No paraspinal tenderness Heart: regular rate and rhythm Lungs: Clear to auscultation bilaterally. Vascular: No carotid bruits. Neurological Exam: Mental status: alert and oriented to person, place, and time, recent and remote memory intact, fund of knowledge intact, attention and concentration intact, speech fluent and not dysarthric, language intact. Cranial nerves: CN I: not tested CN II: pupils equal, round and reactive to light, visual fields intact CN III, IV, VI:  full range of motion, no nystagmus, no ptosis CN V: facial sensation intact CN VII: upper and lower face symmetric CN VIII: hearing intact CN IX, X: gag intact, uvula midline CN XI: sternocleidomastoid and trapezius muscles intact CN XII: tongue midline Bulk & Tone: normal, no fasciculations. Motor:  5/5 throughout  Sensation: pinprick and vibration sensation intact. Deep Tendon Reflexes:  2+ throughout, toes downgoing.  Finger to nose testing:  Without dysmetria.  Heel to shin:  Without dysmetria.  Gait:  Normal station and stride.  Able to turn and tandem walk. Romberg negative.  IMPRESSION: 1.  Headaches, new headaches in patient over 50.  They are likely tension-type headache.  Neurologic exam is normal.  They do not seem consistent with temporal arteritis.  However, given that he has no prior history of headaches, and that he has these other spells of dizziness/altered sensation, imaging may be warranted. 2.  Episodic dizziness/lightheadedness.  It is presyncope and not vertigo.  Orthostatics were negative, although his heart rate dropped from  60s supine/sitting to 40s standing.  He has history of PVCs, so cardiac etiology may be considered.  From a neurologic standpoint, consider partial seizures.  PLAN: 1.  We will check EEG 2.  Consider MRI of head (or CT of head), given that these are new headaches in a patient over 50 and with associated dizzy spells.  He would like to hold off at this time. 3.  Discussed headache prevention (daily preventative medications and lifestyle modification).  He declines at this time.  Thank you for allowing me to take part in the care of this patient.  45 minutes spent face to face with patient, over 50% spent discussing possible etiologies, testing and management.  Metta Clines, DO  CC:  Sharilyn Sites, MD

## 2017-01-24 NOTE — Patient Instructions (Signed)
Your neurologic exam is normal.  I don't see anything concerning.  However, given that these are new headaches, we can check an MRI of the brain.  Contact me if you wish to pursue this.  In the meantime, we will check an EEG, which records your brainwaves.  My suspicion is low, but sometimes those dizzy spells may represent mild seizures.

## 2017-01-30 ENCOUNTER — Ambulatory Visit (INDEPENDENT_AMBULATORY_CARE_PROVIDER_SITE_OTHER): Payer: Medicare Other | Admitting: Neurology

## 2017-01-30 DIAGNOSIS — R51 Headache: Secondary | ICD-10-CM | POA: Diagnosis not present

## 2017-01-30 DIAGNOSIS — R519 Headache, unspecified: Secondary | ICD-10-CM

## 2017-01-30 DIAGNOSIS — R42 Dizziness and giddiness: Secondary | ICD-10-CM | POA: Diagnosis not present

## 2017-01-30 NOTE — Progress Notes (Signed)
ELECTROENCEPHALOGRAM REPORT  Date of Study: 01/30/2017  Patient's Name: VERA WISHART MRN: [id number] Date of Birth: 02/02/2016  Clinical History: 71 year old man with new onset headaches and episodic dizziness.  Medications: levothyroxine  Technical Summary: A multichannel digital EEG recording measured by the international 10-20 system with electrodes applied with paste and impedances below 5000 ohms performed in our laboratory with EKG monitoring in an awake and asleep patient.  Hyperventilation and photic stimulation were performed.  The digital EEG was referentially recorded, reformatted, and digitally filtered in a variety of bipolar and referential montages for optimal display.    Description: The patient is awake and asleep during the recording.  During maximal wakefulness, there is a symmetric, medium voltage 10 Hz posterior dominant rhythm that attenuates with eye opening.  The record is symmetric.  During drowsiness and sleep, there is an increase in theta slowing of the background.  Vertex waves and symmetric sleep spindles were seen.  Hyperventilation and photic stimulation did not elicit any abnormalities.  There were no epileptiform discharges or electrographic seizures seen.    EKG lead was unremarkable.  Impression: This awake and asleep EEG is normal.    Clinical Correlation: A normal EEG does not exclude a clinical diagnosis of epilepsy.  If further clinical questions remain, prolonged EEG may be helpful.  Clinical correlation is advised.   Metta Clines, DO

## 2017-01-31 ENCOUNTER — Telehealth: Payer: Self-pay | Admitting: Neurology

## 2017-01-31 NOTE — Telephone Encounter (Signed)
-----   Message from Pieter Partridge, DO sent at 01/31/2017  7:14 AM EDT ----- Please contact patient to let him know that the EEG is normal.

## 2017-01-31 NOTE — Telephone Encounter (Signed)
Patient made aware EEG normal.  

## 2017-08-10 ENCOUNTER — Ambulatory Visit: Payer: Medicare Other | Admitting: Podiatry

## 2017-08-10 ENCOUNTER — Ambulatory Visit (INDEPENDENT_AMBULATORY_CARE_PROVIDER_SITE_OTHER): Payer: Medicare Other

## 2017-08-10 ENCOUNTER — Encounter: Payer: Self-pay | Admitting: Podiatry

## 2017-08-10 DIAGNOSIS — M722 Plantar fascial fibromatosis: Secondary | ICD-10-CM | POA: Diagnosis not present

## 2017-08-10 MED ORDER — METHYLPREDNISOLONE 4 MG PO TBPK: ORAL_TABLET | ORAL | 0 refills | Status: DC

## 2017-08-10 MED ORDER — MELOXICAM 15 MG PO TABS: 15.0000 mg | ORAL_TABLET | Freq: Every day | ORAL | 3 refills | Status: DC

## 2017-08-10 NOTE — Patient Instructions (Signed)

## 2017-08-12 NOTE — Progress Notes (Signed)
He presents today states that he has bilateral heel pain left seems to be worse is that over the past 6 months. He also states he feels some tightness and numbness in his toes.  Objective: Vital signs are stable alert and oriented 3. Pulses are palpable. His pain on palpation medial calcaneal tubercle of his left heel. Melissa on the right.  Plan: Discussed etiology pathology conservative versus surgical therapies. Injected left heel today with 20 mg of Kenalog 5 mg of Marcaine to the point of maximal tenderness under sterile Betadine skin prep. Also placement of S brace was provided with both oral and written instructions for care and soaking of his foot. He is also given stretching instructions.

## 2017-09-07 ENCOUNTER — Ambulatory Visit (INDEPENDENT_AMBULATORY_CARE_PROVIDER_SITE_OTHER): Payer: Medicare Other | Admitting: Podiatry

## 2017-09-07 ENCOUNTER — Encounter: Payer: Self-pay | Admitting: Podiatry

## 2017-09-07 DIAGNOSIS — M722 Plantar fascial fibromatosis: Secondary | ICD-10-CM | POA: Diagnosis not present

## 2017-09-07 NOTE — Progress Notes (Addendum)
He is complaining of his left heel being painful.  He states that he does not want another shot.  States that is about 80-85% on a good day and about 50% on a bad day.  Objective: Vital signs are stable he is alert and oriented x3 he has moderate to severe pain on palpation to the left heel.  No pain on medial lateral compression of the calcaneus.  Assessment: Plantar fasciitis left heel.  Plan: Reinjected his left heel today.  Follow-up with him in 1 month

## 2017-10-10 ENCOUNTER — Ambulatory Visit: Payer: Medicare Other | Admitting: Podiatry

## 2017-10-29 ENCOUNTER — Encounter (HOSPITAL_COMMUNITY): Payer: Self-pay | Admitting: *Deleted

## 2017-10-29 ENCOUNTER — Emergency Department (HOSPITAL_COMMUNITY): Payer: Medicare Other

## 2017-10-29 ENCOUNTER — Other Ambulatory Visit: Payer: Self-pay

## 2017-10-29 ENCOUNTER — Emergency Department (HOSPITAL_COMMUNITY)
Admission: EM | Admit: 2017-10-29 | Discharge: 2017-10-30 | Disposition: A | Discharge status: Home / Self Care | Payer: Medicare Other | Attending: Emergency Medicine | Admitting: Emergency Medicine

## 2017-10-29 DIAGNOSIS — E039 Hypothyroidism, unspecified: Secondary | ICD-10-CM | POA: Insufficient documentation

## 2017-10-29 DIAGNOSIS — Z79899 Other long term (current) drug therapy: Secondary | ICD-10-CM | POA: Diagnosis not present

## 2017-10-29 DIAGNOSIS — E860 Dehydration: Secondary | ICD-10-CM | POA: Diagnosis not present

## 2017-10-29 DIAGNOSIS — R0789 Other chest pain: Secondary | ICD-10-CM | POA: Diagnosis not present

## 2017-10-29 DIAGNOSIS — R112 Nausea with vomiting, unspecified: Secondary | ICD-10-CM | POA: Insufficient documentation

## 2017-10-29 DIAGNOSIS — I10 Essential (primary) hypertension: Secondary | ICD-10-CM | POA: Insufficient documentation

## 2017-10-29 DIAGNOSIS — R109 Unspecified abdominal pain: Secondary | ICD-10-CM | POA: Diagnosis not present

## 2017-10-29 DIAGNOSIS — R197 Diarrhea, unspecified: Secondary | ICD-10-CM | POA: Insufficient documentation

## 2017-10-29 DIAGNOSIS — R0602 Shortness of breath: Secondary | ICD-10-CM | POA: Diagnosis not present

## 2017-10-29 LAB — COMPREHENSIVE METABOLIC PANEL
ALK PHOS: 58 U/L (ref 38–126)
ALT: 41 U/L (ref 17–63)
ANION GAP: 14 (ref 5–15)
AST: 39 U/L (ref 15–41)
Albumin: 4.5 g/dL (ref 3.5–5.0)
BILIRUBIN TOTAL: 1.3 mg/dL — AB (ref 0.3–1.2)
BUN: 20 mg/dL (ref 6–20)
CO2: 23 mmol/L (ref 22–32)
CREATININE: 1.02 mg/dL (ref 0.61–1.24)
Calcium: 9.6 mg/dL (ref 8.9–10.3)
Chloride: 102 mmol/L (ref 101–111)
GFR calc Af Amer: >60 mL/min (ref >60)
GLUCOSE: 133 mg/dL — AB (ref 65–99)
POTASSIUM: 3.7 mmol/L (ref 3.5–5.1)
Sodium: 139 mmol/L (ref 135–145)
Total Protein: 7.7 g/dL (ref 6.5–8.1)

## 2017-10-29 LAB — CBC WITH DIFFERENTIAL/PLATELET
Basophils Absolute: 0 10*3/uL (ref 0.0–0.1)
Basophils Relative: 0 %
EOS ABS: 0.1 10*3/uL (ref 0.0–0.7)
EOS PCT: 1 %
HCT: 48.4 % (ref 39.0–52.0)
HEMOGLOBIN: 17.1 g/dL — AB (ref 13.0–17.0)
LYMPHS ABS: 0.4 10*3/uL — AB (ref 0.7–4.0)
Lymphocytes Relative: 4 %
MCH: 30.8 pg (ref 26.0–34.0)
MCHC: 35.3 g/dL (ref 30.0–36.0)
MCV: 87.2 fL (ref 78.0–100.0)
MONO ABS: 0.4 10*3/uL (ref 0.1–1.0)
MONOS PCT: 4 %
Neutro Abs: 9.2 10*3/uL — ABNORMAL HIGH (ref 1.7–7.7)
Neutrophils Relative %: 91 %
PLATELETS: 189 10*3/uL (ref 150–400)
RBC: 5.55 MIL/uL (ref 4.22–5.81)
RDW: 12.7 % (ref 11.5–15.5)
WBC: 10.1 10*3/uL (ref 4.0–10.5)

## 2017-10-29 LAB — TROPONIN I

## 2017-10-29 LAB — LIPASE, BLOOD: Lipase: 32 U/L (ref 11–51)

## 2017-10-29 MED ORDER — ONDANSETRON HCL 4 MG/2ML IJ SOLN
4.0000 mg | Freq: Once | INTRAMUSCULAR | Status: AC
  Administered 2017-10-29: 4 mg via INTRAVENOUS
  Filled 2017-10-29: qty 2

## 2017-10-29 MED ORDER — SODIUM CHLORIDE 0.9 % IV BOLUS (SEPSIS)
1000.0000 mL | Freq: Once | INTRAVENOUS | Status: AC
  Administered 2017-10-29: 1000 mL via INTRAVENOUS

## 2017-10-29 MED ORDER — SODIUM CHLORIDE 0.9 % IV BOLUS (SEPSIS)
1000.0000 mL | Freq: Once | INTRAVENOUS | Status: AC
  Administered 2017-10-30: 1000 mL via INTRAVENOUS

## 2017-10-29 NOTE — ED Notes (Signed)
Bedside toilet in room- pt reports having to have - No BM at this time- pt back to bed.

## 2017-10-29 NOTE — ED Triage Notes (Signed)
Pt brought in by [facility] for c/o abdominal pain with n/v/d and chills that started tonight around 1930

## 2017-10-29 NOTE — ED Provider Notes (Signed)
[facility] EMERGENCY DEPARTMENT Provider Note   CSN: [id number] Arrival date & time: 10/29/17  2216  Time seen 23:06 PM    History   Chief Complaint Chief Complaint  Patient presents with  . Abdominal Pain    HPI Micheal Baker is a 72 y.o. male.  HPI patient reports about 8 PM tonight he started having nausea, vomiting, diarrhea, and abdominal pain.  He states he has had about 3 episodes of diarrhea, he states the first episode was watery mixed with solid and then became loose.  He continues to have nausea and states he is vomited about 5-6 times.  He states his abdominal pain is located just above his umbilicus.  He denies any fever, change in diet, or being around anybody else who is ill.  He does report being on antibiotics a couple weeks ago for a sinus infection but does not recall what he took.  He also states after he started having the GI symptoms he started having chest discomfort that he states moves from "nipple to nipple" that he describes as tightness and soreness.  He states it also made him feel short of breath.  He states the pain lasted 5-6 minutes but states he still has some mild tightness.  He states he is never had the chest pain before.  He states the GI symptoms he has had before when he had a stomach virus.  He does state he started some over-the-counter supplements, B complex for his neuropathy and "lactic [place] Acid" ? Alpha-lipoic Acid for neuropathy about 3-4 weeks ago.  He states his worst symptom right now is his abdominal pain.  [facility], [facility] Dr Jacinta Shoe  Past Medical History:  Diagnosis Date  . Acute medial meniscus tear of left knee   . Depression   . Frequent PVCs   . Hypertension   . Hypothyroidism   . NSVT (nonsustained ventricular tachycardia) ([place])   . Osteoarthritis   . Thyroid disease     Patient Active Problem List   Diagnosis Date Noted  . Acute lateral meniscus tear of left knee 06/27/2014  . Acute  medial meniscus tear of left knee   . NSVT (nonsustained ventricular tachycardia) ([place])   . Bradycardia 06/10/2014  . 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  . Cough 10/03/2012  . Chest pain 08/08/2011  . Benign hypertension 08/08/2011  . Hypothyroidism 08/08/2011    Past Surgical History:  Procedure Laterality Date  . CATARACT EXTRACTION     left  . COLONOSCOPY N/A 04/08/2016   Procedure: COLONOSCOPY;  Surgeon: Danie Binder, MD;  [facility];  Service: Endoscopy;  Laterality: N/A;  11:15 AM  . EYE SURGERY     left-scar tissue  . groin surgery  2010   growth removed   . KNEE ARTHROSCOPY     left  . KNEE ARTHROSCOPY WITH LATERAL MENISECTOMY Right 03/11/2015   Procedure: RIGHT KNEE ARTHROSCOPY WITH MEDIALAND LATERAL MENISECTOMY;  Surgeon: Elsie Saas, MD;  Location: [place];  Service: Orthopedics;  Laterality: Right;  . KNEE ARTHROSCOPY WITH MEDIAL MENISECTOMY Left 06/27/2014   Procedure: LEFT KNEE ARTHROSCOPY WITH PARTIAL MEDIAL AND LATERAL MENISECTOMIES AND CHONDROPLASTY;  Surgeon: Lorn Junes, MD;  Location: [place];  Service: Orthopedics;  Laterality: Left;  . KNEE ARTHROSCOPY WITH MEDIAL MENISECTOMY Right 03/11/2015   Procedure: KNEE ARTHROSCOPY WITH MEDIAL MENISECTOMY;  Surgeon: Elsie Saas, MD;  Location: [place];  Service: Orthopedics;  Laterality: Right;  .  LEFT HEART CATHETERIZATION WITH CORONARY ANGIOGRAM N/A 09/14/2011   Procedure: LEFT HEART CATHETERIZATION WITH CORONARY ANGIOGRAM;  Surgeon: Pixie Casino, MD;  Location: [facility] CATH LAB;  Service: Cardiovascular;  Laterality: N/A;  . POLYPECTOMY  04/08/2016   Procedure: POLYPECTOMY;  Surgeon: Danie Binder, MD;  [facility];  Service: Endoscopy;;  colon   . RETINAL DETACHMENT SURGERY     left       Home Medications    Prior to Admission medications   Medication Sig Start Date End Date Taking? Authorizing Provider  azithromycin (ZITHROMAX) 250 MG  tablet Take 250-500 mg by mouth See admin instructions. 500mg  on day 1, then 250mg  on days 2 through 5 10/19/17   [provider]  fluticasone (VERAMYST) 27.5 MCG/SPRAY nasal spray Place 2 sprays into the nose daily.    [provider]  levocetirizine (XYZAL) 5 MG tablet  10/12/17   [provider]  levothyroxine (SYNTHROID, LEVOTHROID) 75 MCG tablet Take 1 tablet by mouth daily. 03/27/15   [provider]  meloxicam (MOBIC) 15 MG tablet Take 1 tablet (15 mg total) by mouth daily. 08/10/17   Hyatt, Max T, DPM  ondansetron (ZOFRAN) 4 MG tablet Take 1 tablet (4 mg total) by mouth every 8 (eight) hours as needed. 10/30/17   Rolland Porter, MD  valACYclovir (VALTREX) 1000 MG tablet Take 1,000 mg by mouth 2 (two) times daily.    [provider]    Family History Family History  Problem Relation Age of Onset  . Hypertension Mother   . Hypertension Father   . Heart attack Father   . Asthma Daughter     Social History Social History   Tobacco Use  . Smoking status: Never Smoker  . Smokeless tobacco: Never Used  . Tobacco comment: 06/10/14 1968- Tried it once and didn't like it- AJ  Substance Use Topics  . Alcohol use: Yes    Alcohol/week: 0.0 oz    Comment: occasionally  . Drug use: No  lives alone   Allergies   Avelox [moxifloxacin] and Penicillins   Review of Systems Review of Systems  All other systems reviewed and are negative.    Physical Exam Updated Vital Signs BP 140/84   Pulse (!) 107   Temp 99.8 F (37.7 C) (Oral)   Resp 10   Ht 6\' 5"  (1.956 m)   Wt 93 kg (205 lb)   SpO2 96%   BMI 24.31 kg/m   Vital signs normal except for tachycardia   Physical Exam  Constitutional: He is oriented to person, place, and time. He appears well-developed and well-nourished.  Non-toxic appearance. He does not appear ill. No distress.  HENT:  Head: Normocephalic and atraumatic.  Right Ear: External ear normal.  Left Ear: External ear  normal.  Nose: Nose normal. No mucosal edema or rhinorrhea.  Mouth/Throat: Mucous membranes are dry. No dental abscesses or uvula swelling.  Eyes: Conjunctivae and EOM are normal. Pupils are equal, round, and reactive to light.  Neck: Normal range of motion and full passive range of motion without pain. Neck supple.  Cardiovascular: Normal rate, regular rhythm and normal heart sounds. Exam reveals no gallop and no friction rub.  No murmur heard. Pulmonary/Chest: Effort normal and breath sounds normal. No respiratory distress. He has no wheezes. He has no rhonchi. He has no rales. He exhibits no tenderness and no crepitus.  Area he indicates is painful noted    Abdominal: Soft. Normal appearance and bowel sounds  are normal. He exhibits no distension. There is no tenderness. There is no rebound and no guarding.    If he does not display tenderness to palpation, but area he states is painful is noted  Musculoskeletal: Normal range of motion. He exhibits no edema or tenderness.  Moves all extremities well.   Neurological: He is alert and oriented to person, place, and time. He has normal strength. No cranial nerve deficit.  Skin: Skin is warm, dry and intact. No rash noted. No erythema. There is pallor.  Psychiatric: He has a normal mood and affect. His speech is normal and behavior is normal. His mood appears not anxious.  Nursing note and vitals reviewed.    ED Treatments / Results  Labs (all labs ordered are listed, but only abnormal results are displayed) Results for orders placed or performed during the hospital encounter of 10/29/17  Comprehensive metabolic panel  Result Value Ref Range   Sodium 139 135 - 145 mmol/L   Potassium 3.7 3.5 - 5.1 mmol/L   Chloride 102 101 - 111 mmol/L   CO2 23 22 - 32 mmol/L   Glucose, Bld 133 (H) 65 - 99 mg/dL   BUN 20 6 - 20 mg/dL   Creatinine, Ser 1.02 0.61 - 1.24 mg/dL   Calcium 9.6 8.9 - 10.3 mg/dL   Total Protein 7.7 6.5 - 8.1 g/dL   Albumin  4.5 3.5 - 5.0 g/dL   AST 39 15 - 41 U/L   ALT 41 17 - 63 U/L   Alkaline Phosphatase 58 38 - 126 U/L   Total Bilirubin 1.3 (H) 0.3 - 1.2 mg/dL   GFR calc non Af Amer >60 >60 mL/min   GFR calc Af Amer >60 >60 mL/min   Anion gap 14 5 - 15  Lipase, blood  Result Value Ref Range   Lipase 32 11 - 51 U/L  CBC with Differential  Result Value Ref Range   WBC 10.1 4.0 - 10.5 K/uL   RBC 5.55 4.22 - 5.81 MIL/uL   Hemoglobin 17.1 (H) 13.0 - 17.0 g/dL   HCT 48.4 39.0 - 52.0 %   MCV 87.2 78.0 - 100.0 fL   MCH 30.8 26.0 - 34.0 pg   MCHC 35.3 30.0 - 36.0 g/dL   RDW 12.7 11.5 - 15.5 %   Platelets 189 150 - 400 K/uL   Neutrophils Relative % 91 %   Neutro Abs 9.2 (H) 1.7 - 7.7 K/uL   Lymphocytes Relative 4 %   Lymphs Abs 0.4 (L) 0.7 - 4.0 K/uL   Monocytes Relative 4 %   Monocytes Absolute 0.4 0.1 - 1.0 K/uL   Eosinophils Relative 1 %   Eosinophils Absolute 0.1 0.0 - 0.7 K/uL   Basophils Relative 0 %   Basophils Absolute 0.0 0.0 - 0.1 K/uL  Troponin I  Result Value Ref Range   Troponin I <0.03 <0.03 ng/mL  Troponin I  Result Value Ref Range   Troponin I <0.03 <0.03 ng/mL   Laboratory interpretation all normal except mild hyperglycemia, most likely reactive    EKG  EKG Interpretation  Date/Time:  Sunday October 29 2017 23:23:31 EST Ventricular Rate:  87 PR Interval:    QRS Duration: 88 QT Interval:  395 QTC Calculation: 476 R Axis:   27 Text Interpretation:  Sinus rhythm Ventricular premature complex Anteroseptal infarct, old Minimal ST depression, inferior leads Since last tracing 31 Mar 2015 ST more depressed Inferior leads Confirmed by Rolland Porter [phone]) on 10/29/2017 11:37:54 PM      #  2   EKG Interpretation  Date/Time:  Monday October 30 2017 03:01:03 EST Ventricular Rate:  107 PR Interval:    QRS Duration: 83 QT Interval:  347 QTC Calculation: 463 R Axis:   -3 Text Interpretation:  Sinus tachycardia Anteroseptal infarct, old Nonspecific T abnormalities, lateral  leads Confirmed by Rolland Porter [phone]) on 10/30/2017 3:12:52 AM         Radiology Dg Chest Port 1 View  Result Date: 10/29/2017 CLINICAL DATA:  Chest pain short of breath EXAM: PORTABLE CHEST 1 VIEW COMPARISON:  11/09/2015 FINDINGS: The heart size and mediastinal contours are within normal limits. Both lungs are clear. Calcified granuloma/lung nodules are unchanged. Calcified hilar nodes. The visualized skeletal structures are unremarkable. IMPRESSION: No active disease. Electronically Signed   By: Donavan Foil M.D.   On: 10/29/2017 23:41    Procedures Procedures (including critical care time)  Medications Ordered in ED Medications  sodium chloride 0.9 % bolus 1,000 mL (0 mLs Intravenous Stopped 10/30/17 0217)  sodium chloride 0.9 % bolus 1,000 mL (0 mLs Intravenous Stopped 10/30/17 0017)  ondansetron (ZOFRAN) injection 4 mg (4 mg Intravenous Given 10/29/17 2332)  ondansetron (ZOFRAN) injection 4 mg (4 mg Intravenous Given 10/30/17 0140)  sodium chloride 0.9 % bolus 1,000 mL (0 mLs Intravenous Stopped 10/30/17 0306)     Initial Impression / Assessment and Plan / ED Course  I have reviewed the triage vital signs and the nursing notes.  Pertinent labs & imaging results that were available during my care of the patient were reviewed by me and considered in my medical decision making (see chart for details).     Patient appeared to be dehydrated.  He was given IV fluids and IV nausea occasions.  EKG and troponin was done to evaluate his complaints of chest pain.  Recheck 12:30 AM patient states his chest pain is gone.  Complains of being cold, however he is getting IV fluids.  We discussed getting a delta troponin in the next 1-2 hours.  Recheck at 02:00 AM, no diarrhea since I saw patient, states his nausea is better after second dose of zofran. Reports no urine output.   02:50 AM Delta troponin ordered and repeat EKG.   03:00 Pt is having urine output. Will try oral fluids.    Recheck 3:40 AM patient states when he drank it started to make him feel queasy but it resolved.  We discussed his repeat EKG does not have any acute changes, I am still waiting for lab to draw his delta troponin.  I think patient will be able to be discharged soon.  5:20 AM patient's delta troponin is negative.  He has had no vomiting or diarrhea while in the ED.  He has taken oral fluids without problems.  He is going to be discharged home.  He should drink plenty of fluids, use the Zofran for nausea or vomiting, Imodium over-the-counter for diarrhea.  He should be rechecked if he gets dehydrated again or if he gets chest pain that lasts constantly more than 20-30 minutes.  Final Clinical Impressions(s) / ED Diagnoses   Final diagnoses:  Nausea vomiting and diarrhea  Dehydration  Atypical chest pain    ED Discharge Orders        Ordered    ondansetron (ZOFRAN) 4 MG tablet  Every 8 hours PRN     10/30/17 0526     Plan discharge  Rolland Porter, MD, Barbette Or, MD 10/30/17 [phone]

## 2017-10-30 LAB — TROPONIN I: Troponin I: <0.03 ng/mL (ref <0.03)

## 2017-10-30 MED ORDER — ONDANSETRON HCL 4 MG PO TABS: 4.0000 mg | ORAL_TABLET | Freq: Three times a day (TID) | ORAL | 0 refills | Status: DC | PRN

## 2017-10-30 MED ORDER — SODIUM CHLORIDE 0.9 % IV BOLUS (SEPSIS)
1000.0000 mL | Freq: Once | INTRAVENOUS | Status: AC
  Administered 2017-10-30: 1000 mL via INTRAVENOUS

## 2017-10-30 MED ORDER — ONDANSETRON HCL 4 MG/2ML IJ SOLN
4.0000 mg | Freq: Once | INTRAMUSCULAR | Status: AC
  Administered 2017-10-30: 4 mg via INTRAVENOUS
  Filled 2017-10-30: qty 2

## 2017-10-30 NOTE — ED Notes (Signed)
Pt tolerated fluid challenge- no vomiting- pt reports some abd pain and chills returning. Dr Tomi Bamberger aware.

## 2017-10-30 NOTE — Discharge Instructions (Signed)
Drink plenty of fluids (clear liquids) then start a bland diet later this morning such as toast, crackers, jello, [employer] chicken noodle soup. Use the zofran for nausea or vomiting. Take imodium OTC for diarrhea. Avoid milk products until the diarrhea is gone.  Please call Dr. Court Joy office to get an appointment to have them evaluate you for your chest pain that you had tonight.  Return to the emergency department if you get chest pain that lasts constantly more than 20-30 minutes or if you get dehydrated again.

## 2017-10-30 NOTE — ED Notes (Signed)
Pt given gingerale for fluid challenge. 

## 2017-10-30 NOTE — ED Notes (Signed)
Pt c/o nausea returning- new orders received.

## 2017-10-30 NOTE — ED Notes (Signed)
Pt has not had any diarrhea while in ED.  Pt has not had any more vomiting episodes since first arriving to ED.

## 2018-03-19 ENCOUNTER — Ambulatory Visit: Payer: Medicare Other | Admitting: Podiatry

## 2018-03-19 ENCOUNTER — Encounter: Payer: Self-pay | Admitting: Podiatry

## 2018-03-19 DIAGNOSIS — M722 Plantar fascial fibromatosis: Secondary | ICD-10-CM | POA: Diagnosis not present

## 2018-03-19 MED ORDER — TRIAMCINOLONE ACETONIDE 10 MG/ML IJ SUSP
10.0000 mg | Freq: Once | INTRAMUSCULAR | Status: AC
  Administered 2018-03-19: 10 mg

## 2018-03-19 NOTE — Patient Instructions (Signed)

## 2018-03-19 NOTE — Progress Notes (Signed)
Subjective: 72 year old male presents the office today for concerns of left heel pain.  He has been care of Dr. Milinda Pointer last year he had injections done well however over the last 2 months he has had pain to the bottom of his left heel.  He denies any recent injury or trauma.  He also reports that he has been followed with orthopedics for his left knee.  He was started back on meloxicam about 2 weeks ago.  Denies any swelling or recent injury. Denies any systemic complaints such as fevers, chills, nausea, vomiting. No acute changes since last appointment, and no other complaints at this time.   Objective: AAO x3, NAD DP/PT pulses palpable bilaterally, CRT less than 3 seconds Tenderness to palpation along the plantar medial tubercle of the calcaneus at the insertion of plantar fascia on the left foot. There is no pain along the course of the plantar fascia within the arch of the foot. Plantar fascia appears to be intact. There is no pain with lateral compression of the calcaneus or pain with vibratory sensation. There is no pain along the course or insertion of the achilles tendon. No other areas of tenderness to bilateral lower extremities.  Negative Tinel sign No open lesions or pre-ulcerative lesions.  No pain with calf compression, swelling, warmth, erythema  Assessment: Left foot chronic plantar fasciitis  Plan: -All treatment options discussed with the patient including all alternatives, risks, complications.  -Steroid injection was performed in the left foot today.  See procedure note below.  Plantar fascial taping was applied.  Stretching, icing exercises daily.  Discussed shoe modifications and orthotics.  He has over-the-counter inserts right now.  Can consider formal physical therapy if symptoms continue. -Patient encouraged to call the office with any questions, concerns, change in symptoms.  -RTC 3 weeks or sooner if needed  Procedure: Injection Tendon/Ligament Discussed alternatives,  risks, complications and verbal consent was obtained.  Location: Left plantar fascia at the glabrous junction; medial and lateral approach. Skin Prep: Alcohol. Injectate: 0.5cc 0.5% marcaine plain, 0.5 cc 2% lidocaine plain and, 1 cc kenalog 10. Disposition: Patient tolerated procedure well. Injection site dressed with a band-aid.  Post-injection care was discussed and return precautions discussed.   Trula Slade DPM

## 2018-04-10 ENCOUNTER — Ambulatory Visit: Payer: Medicare Other | Admitting: Podiatry

## 2018-04-10 ENCOUNTER — Encounter: Payer: Self-pay | Admitting: Podiatry

## 2018-04-10 DIAGNOSIS — M722 Plantar fascial fibromatosis: Secondary | ICD-10-CM | POA: Diagnosis not present

## 2018-04-11 NOTE — Progress Notes (Signed)
He presents today for follow-up of his left heel pain states that it is better but is still painful in certain days.  He states that he continues all of his conservative therapies.  Objective: Signs are stable alert and oriented x3.  Pulses are palpable.  Neurologic sensorium is intact.  Degenerative flexors are intact.  Pain on palpation medial calcaneal tubercle of the left heel is present but not nearly as tender as it was in the past.  Assessment: Resolving plantar fasciitis left.  Plan: We discussed etiology pathology conservative or surgical therapies this point I provided him with another injection of 20 mg Kenalog 5 mg Marcaine point maximal tenderness of the left heel he understands this and understands why we are doing this I will follow-up with him in 1 month I recommend he continue all other conservative therapies.

## 2019-04-04 ENCOUNTER — Other Ambulatory Visit: Payer: Self-pay

## 2019-04-04 ENCOUNTER — Encounter: Payer: Self-pay | Admitting: Podiatry

## 2019-04-04 ENCOUNTER — Ambulatory Visit (INDEPENDENT_AMBULATORY_CARE_PROVIDER_SITE_OTHER): Payer: Medicare Other | Admitting: Podiatry

## 2019-04-04 VITALS — Temp 98.2°F

## 2019-04-04 DIAGNOSIS — M722 Plantar fascial fibromatosis: Secondary | ICD-10-CM

## 2019-04-04 NOTE — Progress Notes (Signed)
Subjective:  Patient ID: JW COVIN, male    DOB: 11-04-1945,  MRN: [id number] HPI Chief Complaint  Patient presents with  . Plantar Fasciitis    Follow up bilateral heels   "Its been tolerable for awhile, but its gotten worse again. I tried OTC inserts and PF sleeves, both are bothersome, taking meloxicam too"  . Foot Pain    Discussed neuropathy in the past and wonders if there is any new treatments other than medication    73 y.o. male presents with the above complaint.   ROS: Denies fever chills nausea vomiting muscle aches pains calf pain back pain chest pain shortness of breath.  Past Medical History:  Diagnosis Date  . Acute medial meniscus tear of left knee   . Depression   . Frequent PVCs   . Hypertension   . Hypothyroidism   . NSVT (nonsustained ventricular tachycardia) ([place])   . Osteoarthritis   . Thyroid disease    Past Surgical History:  Procedure Laterality Date  . CATARACT EXTRACTION     left  . COLONOSCOPY N/A 04/08/2016   Procedure: COLONOSCOPY;  Surgeon: Danie Binder, MD;  [facility];  Service: Endoscopy;  Laterality: N/A;  11:15 AM  . EYE SURGERY     left-scar tissue  . groin surgery  2010   growth removed   . KNEE ARTHROSCOPY     left  . KNEE ARTHROSCOPY WITH LATERAL MENISECTOMY Right 03/11/2015   Procedure: RIGHT KNEE ARTHROSCOPY WITH MEDIALAND LATERAL MENISECTOMY;  Surgeon: Elsie Saas, MD;  Location: [place];  Service: Orthopedics;  Laterality: Right;  . KNEE ARTHROSCOPY WITH MEDIAL MENISECTOMY Left 06/27/2014   Procedure: LEFT KNEE ARTHROSCOPY WITH PARTIAL MEDIAL AND LATERAL MENISECTOMIES AND CHONDROPLASTY;  Surgeon: Lorn Junes, MD;  Location: [place];  Service: Orthopedics;  Laterality: Left;  . KNEE ARTHROSCOPY WITH MEDIAL MENISECTOMY Right 03/11/2015   Procedure: KNEE ARTHROSCOPY WITH MEDIAL MENISECTOMY;  Surgeon: Elsie Saas, MD;  Location: [place];  Service:  Orthopedics;  Laterality: Right;  . LEFT HEART CATHETERIZATION WITH CORONARY ANGIOGRAM N/A 09/14/2011   Procedure: LEFT HEART CATHETERIZATION WITH CORONARY ANGIOGRAM;  Surgeon: Pixie Casino, MD;  Location: [facility] CATH LAB;  Service: Cardiovascular;  Laterality: N/A;  . POLYPECTOMY  04/08/2016   Procedure: POLYPECTOMY;  Surgeon: Danie Binder, MD;  [facility];  Service: Endoscopy;;  colon   . RETINAL DETACHMENT SURGERY     left    Current Outpatient Medications:  .  ALPHA LIPOIC ACID PO, Take 1 tablet by mouth daily., Disp: , Rfl:  .  B Complex-C (B-COMPLEX WITH VITAMIN C) tablet, Take 1 tablet by mouth 2 (two) times daily., Disp: , Rfl:  .  calcitRIOL (ROCALTROL) 0.25 MCG capsule, , Disp: , Rfl:  .  diclofenac sodium (VOLTAREN) 1 % GEL, , Disp: , Rfl:  .  fluticasone (VERAMYST) 27.5 MCG/SPRAY nasal spray, Place 2 sprays into the nose daily., Disp: , Rfl:  .  levocetirizine (XYZAL) 5 MG tablet, , Disp: , Rfl:  .  levothyroxine (SYNTHROID, LEVOTHROID) 75 MCG tablet, Take 1 tablet by mouth daily., Disp: , Rfl:  .  meloxicam (MOBIC) 15 MG tablet, Take 1 tablet (15 mg total) by mouth daily., Disp: 30 tablet, Rfl: 3 .  TRINTELLIX 10 MG TABS tablet, , Disp: , Rfl:  .  valACYclovir (VALTREX) 1000 MG tablet, Take 1,000 mg by mouth 2 (two) times daily., Disp: , Rfl:   Allergies  Allergen Reactions  . Avelox [Moxifloxacin]   . Penicillins     chills   Review of Systems Objective:   Vitals:   04/04/19 1051  Temp: 98.2 F (36.8 C)    General: Well developed, nourished, in no acute distress, alert and oriented x3   Dermatological: Skin is warm, dry and supple bilateral. Nails x 10 are well maintained; remaining integument appears unremarkable at this time. There are no open sores, no preulcerative lesions, no rash or signs of infection present.  Vascular: Dorsalis Pedis artery and Posterior Tibial artery pedal pulses are 2/4 bilateral with immedate capillary fill time. Pedal  hair growth present. No varicosities and no lower extremity edema present bilateral.   Neruologic: Grossly intact via light touch bilateral. Vibratory intact via tuning fork bilateral. Protective threshold with Semmes Wienstein monofilament intact to all pedal sites bilateral. Patellar and Achilles deep tendon reflexes 2+ bilateral. No Babinski or clonus noted bilateral.   Musculoskeletal: No gross boney pedal deformities bilateral. No pain, crepitus, or limitation noted with foot and ankle range of motion bilateral. Muscular strength 5/5 in all groups tested bilateral.  Gait: Unassisted, Nonantalgic.    Radiographs:  None taken  Assessment & Plan:   Assessment: Plantar fasciitis bilateral.  Idiopathic neuropathy.  Plan: Discussed etiology pathology conservative versus surgical therapies.  At this point I went ahead and reinjected his bilateral heels with 20 mg Kenalog 5 mg Marcaine point maximal tenderness.  Continue all other conservative therapies     Max T. [place], [place]

## 2019-08-14 ENCOUNTER — Other Ambulatory Visit: Payer: Self-pay

## 2019-08-14 DIAGNOSIS — Z20822 Contact with and (suspected) exposure to covid-19: Secondary | ICD-10-CM

## 2019-08-17 LAB — NOVEL CORONAVIRUS, NAA: SARS-CoV-2, NAA: NOT DETECTED

## 2019-08-19 ENCOUNTER — Telehealth: Payer: Self-pay | Admitting: General Practice

## 2019-08-19 NOTE — Telephone Encounter (Signed)
Gave patient negative covid test results Patient understood 

## 2019-09-16 DIAGNOSIS — M9903 Segmental and somatic dysfunction of lumbar region: Secondary | ICD-10-CM | POA: Diagnosis not present

## 2019-09-16 DIAGNOSIS — M545 Low back pain: Secondary | ICD-10-CM | POA: Diagnosis not present

## 2019-09-16 DIAGNOSIS — M9905 Segmental and somatic dysfunction of pelvic region: Secondary | ICD-10-CM | POA: Diagnosis not present

## 2019-09-16 DIAGNOSIS — M9902 Segmental and somatic dysfunction of thoracic region: Secondary | ICD-10-CM | POA: Diagnosis not present

## 2019-09-24 ENCOUNTER — Ambulatory Visit: Payer: Medicare PPO | Attending: Internal Medicine

## 2019-09-24 ENCOUNTER — Other Ambulatory Visit: Payer: Self-pay

## 2019-09-24 DIAGNOSIS — Z20822 Contact with and (suspected) exposure to covid-19: Secondary | ICD-10-CM | POA: Insufficient documentation

## 2019-09-26 LAB — NOVEL CORONAVIRUS, NAA: SARS-CoV-2, NAA: NOT DETECTED

## 2019-09-30 DIAGNOSIS — M9905 Segmental and somatic dysfunction of pelvic region: Secondary | ICD-10-CM | POA: Diagnosis not present

## 2019-09-30 DIAGNOSIS — M9903 Segmental and somatic dysfunction of lumbar region: Secondary | ICD-10-CM | POA: Diagnosis not present

## 2019-09-30 DIAGNOSIS — M9902 Segmental and somatic dysfunction of thoracic region: Secondary | ICD-10-CM | POA: Diagnosis not present

## 2019-09-30 DIAGNOSIS — M545 Low back pain: Secondary | ICD-10-CM | POA: Diagnosis not present

## 2019-10-31 DIAGNOSIS — J111 Influenza due to unidentified influenza virus with other respiratory manifestations: Secondary | ICD-10-CM | POA: Diagnosis not present

## 2019-11-04 ENCOUNTER — Other Ambulatory Visit: Payer: Self-pay

## 2019-11-04 ENCOUNTER — Ambulatory Visit: Payer: Medicare PPO | Attending: Internal Medicine

## 2019-11-04 DIAGNOSIS — F4323 Adjustment disorder with mixed anxiety and depressed mood: Secondary | ICD-10-CM | POA: Diagnosis not present

## 2019-11-04 DIAGNOSIS — Z20822 Contact with and (suspected) exposure to covid-19: Secondary | ICD-10-CM | POA: Diagnosis not present

## 2019-11-04 DIAGNOSIS — F411 Generalized anxiety disorder: Secondary | ICD-10-CM | POA: Diagnosis not present

## 2019-11-04 DIAGNOSIS — F331 Major depressive disorder, recurrent, moderate: Secondary | ICD-10-CM | POA: Diagnosis not present

## 2019-11-05 ENCOUNTER — Telehealth: Payer: Self-pay

## 2019-11-05 LAB — NOVEL CORONAVIRUS, NAA: SARS-CoV-2, NAA: NOT DETECTED

## 2019-11-05 NOTE — Telephone Encounter (Signed)
Patient called and was informed that his COVID-19 test 11/04/19 was negative. He was not infected with the Novel Coronavirus. He verbalized understanding of the information.

## 2019-11-06 DIAGNOSIS — Z6823 Body mass index (BMI) 23.0-23.9, adult: Secondary | ICD-10-CM | POA: Diagnosis not present

## 2019-11-06 DIAGNOSIS — Z1389 Encounter for screening for other disorder: Secondary | ICD-10-CM | POA: Diagnosis not present

## 2019-11-06 DIAGNOSIS — M1991 Primary osteoarthritis, unspecified site: Secondary | ICD-10-CM | POA: Diagnosis not present

## 2019-11-28 DIAGNOSIS — L218 Other seborrheic dermatitis: Secondary | ICD-10-CM | POA: Diagnosis not present

## 2019-11-28 DIAGNOSIS — X32XXXD Exposure to sunlight, subsequent encounter: Secondary | ICD-10-CM | POA: Diagnosis not present

## 2019-11-28 DIAGNOSIS — Z1283 Encounter for screening for malignant neoplasm of skin: Secondary | ICD-10-CM | POA: Diagnosis not present

## 2019-11-28 DIAGNOSIS — L820 Inflamed seborrheic keratosis: Secondary | ICD-10-CM | POA: Diagnosis not present

## 2019-11-28 DIAGNOSIS — L570 Actinic keratosis: Secondary | ICD-10-CM | POA: Diagnosis not present

## 2019-11-28 DIAGNOSIS — D225 Melanocytic nevi of trunk: Secondary | ICD-10-CM | POA: Diagnosis not present

## 2019-11-29 DIAGNOSIS — Z6824 Body mass index (BMI) 24.0-24.9, adult: Secondary | ICD-10-CM | POA: Diagnosis not present

## 2019-11-29 DIAGNOSIS — M1711 Unilateral primary osteoarthritis, right knee: Secondary | ICD-10-CM | POA: Diagnosis not present

## 2019-11-29 DIAGNOSIS — F330 Major depressive disorder, recurrent, mild: Secondary | ICD-10-CM | POA: Diagnosis not present

## 2019-11-29 DIAGNOSIS — Z1389 Encounter for screening for other disorder: Secondary | ICD-10-CM | POA: Diagnosis not present

## 2019-11-29 DIAGNOSIS — Z0000 Encounter for general adult medical examination without abnormal findings: Secondary | ICD-10-CM | POA: Diagnosis not present

## 2019-11-29 DIAGNOSIS — E039 Hypothyroidism, unspecified: Secondary | ICD-10-CM | POA: Diagnosis not present

## 2019-12-06 DIAGNOSIS — Z6824 Body mass index (BMI) 24.0-24.9, adult: Secondary | ICD-10-CM | POA: Diagnosis not present

## 2019-12-06 DIAGNOSIS — F320 Major depressive disorder, single episode, mild: Secondary | ICD-10-CM | POA: Diagnosis not present

## 2019-12-12 DIAGNOSIS — F331 Major depressive disorder, recurrent, moderate: Secondary | ICD-10-CM | POA: Diagnosis not present

## 2019-12-12 DIAGNOSIS — F411 Generalized anxiety disorder: Secondary | ICD-10-CM | POA: Diagnosis not present

## 2019-12-12 DIAGNOSIS — F4323 Adjustment disorder with mixed anxiety and depressed mood: Secondary | ICD-10-CM | POA: Diagnosis not present

## 2020-02-17 DIAGNOSIS — M545 Low back pain: Secondary | ICD-10-CM | POA: Diagnosis not present

## 2020-02-17 DIAGNOSIS — M9903 Segmental and somatic dysfunction of lumbar region: Secondary | ICD-10-CM | POA: Diagnosis not present

## 2020-02-17 DIAGNOSIS — M9902 Segmental and somatic dysfunction of thoracic region: Secondary | ICD-10-CM | POA: Diagnosis not present

## 2020-02-17 DIAGNOSIS — M9905 Segmental and somatic dysfunction of pelvic region: Secondary | ICD-10-CM | POA: Diagnosis not present

## 2020-03-03 DIAGNOSIS — R972 Elevated prostate specific antigen [PSA]: Secondary | ICD-10-CM | POA: Diagnosis not present

## 2020-03-11 DIAGNOSIS — M1991 Primary osteoarthritis, unspecified site: Secondary | ICD-10-CM | POA: Diagnosis not present

## 2020-03-11 DIAGNOSIS — Z6823 Body mass index (BMI) 23.0-23.9, adult: Secondary | ICD-10-CM | POA: Diagnosis not present

## 2020-03-11 DIAGNOSIS — N401 Enlarged prostate with lower urinary tract symptoms: Secondary | ICD-10-CM | POA: Diagnosis not present

## 2020-03-11 DIAGNOSIS — R972 Elevated prostate specific antigen [PSA]: Secondary | ICD-10-CM | POA: Diagnosis not present

## 2020-03-11 DIAGNOSIS — M13811 Other specified arthritis, right shoulder: Secondary | ICD-10-CM | POA: Diagnosis not present

## 2020-03-11 DIAGNOSIS — R351 Nocturia: Secondary | ICD-10-CM | POA: Diagnosis not present

## 2020-03-20 DIAGNOSIS — M545 Low back pain: Secondary | ICD-10-CM | POA: Diagnosis not present

## 2020-03-20 DIAGNOSIS — M9905 Segmental and somatic dysfunction of pelvic region: Secondary | ICD-10-CM | POA: Diagnosis not present

## 2020-03-20 DIAGNOSIS — M9903 Segmental and somatic dysfunction of lumbar region: Secondary | ICD-10-CM | POA: Diagnosis not present

## 2020-03-20 DIAGNOSIS — M9902 Segmental and somatic dysfunction of thoracic region: Secondary | ICD-10-CM | POA: Diagnosis not present

## 2020-04-03 NOTE — Progress Notes (Signed)
[facility] Note  04/06/2020     CHIEF COMPLAINT Patient presents for Retina Evaluation   HISTORY OF PRESENT ILLNESS: Micheal Baker is a 74 y.o. male who presents to the clinic today for:   HPI    Retina Evaluation    In both eyes.  This started 18 years ago.  Duration of 18 years.  Associated Symptoms Floaters.  Negative for Flashes, Blind Spot, Photophobia, Scalp Tenderness, Fever, Pain, Glare, Jaw Claudication, Weight Loss, Distortion, Redness, Trauma, Shoulder/Hip pain and Fatigue.  Context:  distance vision, mid-range vision and near vision.  Treatments tried include no treatments.  I, the attending physician,  performed the HPI with the patient and updated documentation appropriately.          Comments    Patient states had retinal detachment repair OS in 2002 with Dr Merlene Morse. ? History of retinopexy OD, possibly around 2005. Patient has occasional floater. No FOL.        Last edited by Bernarda Caffey, MD on 04/06/2020  2:21 PM. (History)      Referring physician: [facility] [address],  [place] [address]  HISTORICAL INFORMATION:   Selected notes from the [place]: No current outpatient medications on file. (Ophthalmic Drugs)   No current facility-administered medications for this visit. (Ophthalmic Drugs)   Current Outpatient Medications (Other)  Medication Sig  . ALPHA LIPOIC ACID PO Take 1 tablet by mouth daily.  . celecoxib (CELEBREX) 200 MG capsule Take 200 mg by mouth daily.  Marland Kitchen escitalopram (LEXAPRO) 20 MG tablet Take 20 mg by mouth daily.  Marland Kitchen levothyroxine (SYNTHROID, LEVOTHROID) 75 MCG tablet Take 1 tablet by mouth daily.  . tamsulosin (FLOMAX) 0.4 MG CAPS capsule Take 0.4 mg by mouth daily.  . B Complex-C (B-COMPLEX WITH VITAMIN C) tablet Take 1 tablet by mouth 2 (two) times daily. (Patient not taking: Reported on 04/06/2020)  . calcitRIOL (ROCALTROL)  0.25 MCG capsule  (Patient not taking: Reported on 04/06/2020)  . diclofenac sodium (VOLTAREN) 1 % GEL  (Patient not taking: Reported on 04/06/2020)  . fluticasone (VERAMYST) 27.5 MCG/SPRAY nasal spray Place 2 sprays into the nose daily. (Patient not taking: Reported on 04/06/2020)  . levocetirizine (XYZAL) 5 MG tablet  (Patient not taking: Reported on 04/06/2020)  . meloxicam (MOBIC) 15 MG tablet Take 1 tablet (15 mg total) by mouth daily. (Patient not taking: Reported on 04/06/2020)  . TRINTELLIX 10 MG TABS tablet  (Patient not taking: Reported on 04/06/2020)  . valACYclovir (VALTREX) 1000 MG tablet Take 1,000 mg by mouth 2 (two) times daily. (Patient not taking: Reported on 04/06/2020)   No current facility-administered medications for this visit. (Other)      REVIEW OF SYSTEMS: ROS    Positive for: Eyes   Negative for: Constitutional, Gastrointestinal, Neurological, Skin, Genitourinary, Musculoskeletal, HENT, Endocrine, Cardiovascular, Respiratory, Psychiatric, Allergic/Imm, Heme/Lymph   Last edited by Roselee Nova D, COT on 04/06/2020  1:56 PM. (History)       ALLERGIES Allergies  Allergen Reactions  . Avelox [Moxifloxacin]   . Penicillins     chills    PAST MEDICAL HISTORY Past Medical History:  Diagnosis Date  . Acute medial meniscus tear of left knee   . Depression   . Frequent PVCs   . Hypertension   . Hypothyroidism   . NSVT (nonsustained ventricular tachycardia) ([place])   . Osteoarthritis   . Thyroid disease  Past Surgical History:  Procedure Laterality Date  . CATARACT EXTRACTION     left  . COLONOSCOPY N/A 04/08/2016   Procedure: COLONOSCOPY;  Surgeon: Danie Binder, MD;  [facility];  Service: Endoscopy;  Laterality: N/A;  11:15 AM  . EYE SURGERY     left-scar tissue  . groin surgery  2010   growth removed   . KNEE ARTHROSCOPY     left  . KNEE ARTHROSCOPY WITH LATERAL MENISECTOMY Right 03/11/2015   Procedure: RIGHT KNEE ARTHROSCOPY WITH  MEDIALAND LATERAL MENISECTOMY;  Surgeon: Elsie Saas, MD;  Location: [place];  Service: Orthopedics;  Laterality: Right;  . KNEE ARTHROSCOPY WITH MEDIAL MENISECTOMY Left 06/27/2014   Procedure: LEFT KNEE ARTHROSCOPY WITH PARTIAL MEDIAL AND LATERAL MENISECTOMIES AND CHONDROPLASTY;  Surgeon: Lorn Junes, MD;  Location: [place];  Service: Orthopedics;  Laterality: Left;  . KNEE ARTHROSCOPY WITH MEDIAL MENISECTOMY Right 03/11/2015   Procedure: KNEE ARTHROSCOPY WITH MEDIAL MENISECTOMY;  Surgeon: Elsie Saas, MD;  Location: [place];  Service: Orthopedics;  Laterality: Right;  . LEFT HEART CATHETERIZATION WITH CORONARY ANGIOGRAM N/A 09/14/2011   Procedure: LEFT HEART CATHETERIZATION WITH CORONARY ANGIOGRAM;  Surgeon: Pixie Casino, MD;  Location: [facility] CATH LAB;  Service: Cardiovascular;  Laterality: N/A;  . POLYPECTOMY  04/08/2016   Procedure: POLYPECTOMY;  Surgeon: Danie Binder, MD;  [facility];  Service: Endoscopy;;  colon   . RETINAL DETACHMENT SURGERY Left 2002   Dr. Merlene Morse    FAMILY HISTORY Family History  Problem Relation Age of Onset  . Hypertension Mother   . Hypertension Father   . Heart attack Father   . Diabetes Father   . Asthma Daughter     SOCIAL HISTORY Social History   Tobacco Use  . Smoking status: Never Smoker  . Smokeless tobacco: Never Used  . Tobacco comment: 06/10/14 1968- Tried it once and didn't like it- AJ  Vaping Use  . Vaping Use: Never used  Substance Use Topics  . Alcohol use: Yes    Alcohol/week: 0.0 standard drinks    Comment: occasionally  . Drug use: No         OPHTHALMIC EXAM:  Base Eye Exam    Visual Acuity (Snellen - Linear)      Right Left   Dist [place] 20/25 -2 20/70 -2   Dist ph [place] 20/25 +2 20/50       Tonometry (Tonopen, 1:54 PM)      Right Left   Pressure 16 12       Pupils      Dark Light Shape React APD   Right 3 2 Round Brisk None   Left 3 2 Round  Brisk None       Visual Fields (Counting fingers)      Left Right    Full Full       Extraocular Movement      Right Left    Full, Ortho Full, Ortho       Neuro/Psych    Oriented x3: Yes   Mood/Affect: Normal       Dilation    Both eyes: 1.0% Mydriacyl, 2.5% Phenylephrine @ 1:54 PM        Slit Lamp and Fundus Exam    Slit Lamp Exam      Right Left   Lids/Lashes Dermato, mild MGD Dermato   Conjunctiva/Sclera White and quiet White and quiet   Cornea Arcus Arcus, well healed  temporal cataract wound   Anterior Chamber Deep and Clear Deep and Clear   Iris Round and dilated Round and poorly dilated to 81mm   Lens 2-3+ NS, 2-3+ Cortical PCIOL   Vitreous Syneresis, PVD, mild condensations (greatest inferiorly) Syneresis       Fundus Exam      Right Left   Disc Pink and sharp Mild pallor, sharp rim   C/D Ratio 0.3 0.4   Macula Flat, blunted foveal reflex, mild RPE mottling Flat, blunted foveal reflex, mild RPE mottling.  No heme or edema   Vessels Normal Mild attenuation   Periphery Laser scars from 0800-1000 surrounding focal detachment--good barricade in place; otherwise attached Attached; CR scarring from 1030-600 clockwise. ?scleral buckle.  Limited view due to poor dilation.        Refraction    Manifest Refraction      Sphere Cylinder Axis Dist VA   Right +0.25 +0.75 165 20/25-2   Left -2.25 +1.00 077 20/30-2          IMAGING AND PROCEDURES  Imaging and Procedures for 04/06/2020  OCT, Retina - OU - Both Eyes       Right Eye Quality was good. Central Foveal Thickness: 310. Progression has no prior data. Findings include normal foveal contour, no SRF, no IRF.   Left Eye Central Foveal Thickness: 336. Progression has no prior data. Findings include abnormal foveal contour, no IRF, no SRF, macular pucker, outer retinal atrophy (Irregular inner-retinal surface.  Focal CR atrophy temporal macula caught on widefield.).   Notes *Images captured and stored on  drive  Diagnosis / Impression: OD: NFP; No IRF/SRF OS: Irregular inner-retinal surface.  Focal CR atrophy temporal macula caught on widefield.   Clinical management:  See below  Abbreviations: NFP - Normal foveal profile. CME - cystoid macular edema. PED - pigment epithelial detachment. IRF - intraretinal fluid. SRF - subretinal fluid. EZ - ellipsoid zone. ERM - epiretinal membrane. ORA - outer retinal atrophy. ORT - outer retinal tubulation. SRHM - subretinal hyper-reflective material. IRHM - intraretinal hyper-reflective material                ASSESSMENT/PLAN:    ICD-10-CM   1. History of retinal detachment  Z86.69   2. Retinal edema  H35.81 OCT, Retina - OU - Both Eyes  3. Combined forms of age-related cataract of right eye  H25.811   4. Pseudophakia  Z96.1     1,2. Hx of RD OU  - OD focal RD 8-10 oclock s/p laser retinopexy / barricade - early 2000s, Dr. Weston Brass  - OS s/p RD repair early 2000s, Dr. Weston Brass  - has been followed by Dr. Randell Loop  - transferring care here due to proximity and transportation    3. Mixed Cataract OD - The symptoms of cataract, surgical options, and treatments and risks were discussed with patient. - discussed diagnosis and progression - not yet visually significant - monitor for now  4. 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      Return in about 1 year (around 04/06/2021) for Sooner PRN.  There are no Patient Instructions on file for this visit.   Explained the diagnoses, plan, and follow up with the patient and they expressed understanding.  Patient expressed understanding of the importance of proper  follow up care.  This document serves as a record of services personally performed by Gardiner Sleeper, MD, PhD. It was created on their behalf by  Estill Bakes, COT an ophthalmic technician. The creation of this record is the provider's dictation and/or activities during the visit.    Electronically signed by: Estill Bakes, COT 04/03/20 @ 10:33 PM   This document serves as a record of services personally performed by Gardiner Sleeper, MD, PhD. It was created on their behalf by Estill Bakes, COT an ophthalmic technician. The creation of this record is the provider's dictation and/or activities during the visit.    Electronically signed by: Estill Bakes, COT 04/06/20 @ 10:33 PM  Gardiner Sleeper, M.D., Ph.D. Diseases & Surgery of the Retina and Vitreous [facility]  I have reviewed the above documentation for accuracy and completeness, and I agree with the above. Gardiner Sleeper, M.D., Ph.D. 04/06/20 10:33 PM   Abbreviations: M myopia (nearsighted); A astigmatism; H hyperopia (farsighted); P presbyopia; Mrx spectacle prescription;  CTL contact lenses; OD right eye; OS left eye; OU both eyes  XT exotropia; ET esotropia; PEK punctate epithelial keratitis; PEE punctate epithelial erosions; DES dry eye syndrome; MGD meibomian gland dysfunction; ATs artificial tears; PFAT's preservative free artificial tears; [place] nuclear sclerotic cataract; PSC posterior subcapsular cataract; ERM epi-retinal membrane; PVD posterior vitreous detachment; RD retinal detachment; DM diabetes mellitus; DR diabetic retinopathy; NPDR non-proliferative diabetic retinopathy; PDR proliferative diabetic retinopathy; CSME clinically significant macular edema; DME diabetic macular edema; dbh dot blot hemorrhages; CWS cotton wool spot; POAG primary open angle glaucoma; C/D cup-to-disc ratio; HVF humphrey visual field; GVF goldmann visual field; OCT optical coherence tomography; IOP intraocular pressure; BRVO Branch retinal vein occlusion; CRVO central retinal vein occlusion; CRAO central retinal artery occlusion; BRAO branch retinal artery occlusion; RT  retinal tear; SB scleral buckle; PPV pars plana vitrectomy; VH Vitreous hemorrhage; PRP panretinal laser photocoagulation; IVK intravitreal kenalog; VMT vitreomacular traction; MH Macular hole;  NVD neovascularization of the disc; NVE neovascularization elsewhere; AREDS age related eye disease study; ARMD age related macular degeneration; POAG primary open angle glaucoma; EBMD epithelial/anterior basement membrane dystrophy; ACIOL anterior chamber intraocular lens; IOL intraocular lens; PCIOL posterior chamber intraocular lens; Phaco/IOL phacoemulsification with intraocular lens placement; [place] photorefractive keratectomy; LASIK laser assisted in situ keratomileusis; HTN hypertension; DM diabetes mellitus; COPD chronic obstructive pulmonary disease

## 2020-04-06 ENCOUNTER — Ambulatory Visit (INDEPENDENT_AMBULATORY_CARE_PROVIDER_SITE_OTHER): Payer: Medicare PPO | Admitting: Ophthalmology

## 2020-04-06 ENCOUNTER — Encounter (INDEPENDENT_AMBULATORY_CARE_PROVIDER_SITE_OTHER): Payer: Self-pay | Admitting: Ophthalmology

## 2020-04-06 ENCOUNTER — Other Ambulatory Visit: Payer: Self-pay

## 2020-04-06 DIAGNOSIS — H25811 Combined forms of age-related cataract, right eye: Secondary | ICD-10-CM | POA: Diagnosis not present

## 2020-04-06 DIAGNOSIS — Z961 Presence of intraocular lens: Secondary | ICD-10-CM | POA: Diagnosis not present

## 2020-04-06 DIAGNOSIS — H3581 Retinal edema: Secondary | ICD-10-CM | POA: Diagnosis not present

## 2020-04-06 DIAGNOSIS — Z8669 Personal history of other diseases of the nervous system and sense organs: Secondary | ICD-10-CM | POA: Diagnosis not present

## 2020-04-16 ENCOUNTER — Other Ambulatory Visit: Payer: Self-pay

## 2020-04-16 ENCOUNTER — Ambulatory Visit (INDEPENDENT_AMBULATORY_CARE_PROVIDER_SITE_OTHER): Payer: Medicare PPO | Admitting: Podiatry

## 2020-04-16 DIAGNOSIS — M722 Plantar fascial fibromatosis: Secondary | ICD-10-CM | POA: Diagnosis not present

## 2020-04-16 MED ORDER — MELOXICAM 15 MG PO TABS: 15.0000 mg | ORAL_TABLET | Freq: Every day | ORAL | 3 refills | Status: DC

## 2020-04-17 NOTE — Progress Notes (Signed)
Presents today chief complaint of neuropathy and bilateral heel pains patient stated that is hard to rate the heel pain but is there.  It comes and goes states that it is better than it was the last last month when I was in here I do not do anything for it I displayed for to subside.  The neuropathy just because my toes and feet to feel swollen or a sensation like of stepping on a rock or something like that.  It really does not cause a lot of pain however.  Objective: Vital signs are stable alert oriented x3.  Pulses are palpable.  Neurologic sensorium is intact per Almedia Balls monofilament deep to reflexes are intact muscle strength is normal symmetrical pain on palpation medial calcaneal tubercle bilaterally.  Mild pes planus is noted bilaterally.  Assessment: Plantar fasciitis pes planus neuropathy.  Plan: Start him back on his meloxicam 15 mg #93 refills.  Injected the bilateral heels today 20 mg Kenalog 5 mg Marcaine point of maximal tenderness.  Follow-up with him as needed.

## 2020-06-01 DIAGNOSIS — M9903 Segmental and somatic dysfunction of lumbar region: Secondary | ICD-10-CM | POA: Diagnosis not present

## 2020-06-01 DIAGNOSIS — M545 Low back pain: Secondary | ICD-10-CM | POA: Diagnosis not present

## 2020-06-01 DIAGNOSIS — M9902 Segmental and somatic dysfunction of thoracic region: Secondary | ICD-10-CM | POA: Diagnosis not present

## 2020-06-01 DIAGNOSIS — M9905 Segmental and somatic dysfunction of pelvic region: Secondary | ICD-10-CM | POA: Diagnosis not present

## 2020-06-12 DIAGNOSIS — M9905 Segmental and somatic dysfunction of pelvic region: Secondary | ICD-10-CM | POA: Diagnosis not present

## 2020-06-12 DIAGNOSIS — M62830 Muscle spasm of back: Secondary | ICD-10-CM | POA: Diagnosis not present

## 2020-06-12 DIAGNOSIS — M9902 Segmental and somatic dysfunction of thoracic region: Secondary | ICD-10-CM | POA: Diagnosis not present

## 2020-06-12 DIAGNOSIS — M9903 Segmental and somatic dysfunction of lumbar region: Secondary | ICD-10-CM | POA: Diagnosis not present

## 2020-06-15 DIAGNOSIS — Z23 Encounter for immunization: Secondary | ICD-10-CM | POA: Diagnosis not present

## 2020-06-15 DIAGNOSIS — M9905 Segmental and somatic dysfunction of pelvic region: Secondary | ICD-10-CM | POA: Diagnosis not present

## 2020-06-15 DIAGNOSIS — M9902 Segmental and somatic dysfunction of thoracic region: Secondary | ICD-10-CM | POA: Diagnosis not present

## 2020-06-15 DIAGNOSIS — M62830 Muscle spasm of back: Secondary | ICD-10-CM | POA: Diagnosis not present

## 2020-06-15 DIAGNOSIS — M9903 Segmental and somatic dysfunction of lumbar region: Secondary | ICD-10-CM | POA: Diagnosis not present

## 2020-06-17 DIAGNOSIS — M9903 Segmental and somatic dysfunction of lumbar region: Secondary | ICD-10-CM | POA: Diagnosis not present

## 2020-06-17 DIAGNOSIS — M9905 Segmental and somatic dysfunction of pelvic region: Secondary | ICD-10-CM | POA: Diagnosis not present

## 2020-06-17 DIAGNOSIS — M62830 Muscle spasm of back: Secondary | ICD-10-CM | POA: Diagnosis not present

## 2020-06-17 DIAGNOSIS — M9902 Segmental and somatic dysfunction of thoracic region: Secondary | ICD-10-CM | POA: Diagnosis not present

## 2020-06-26 DIAGNOSIS — M62830 Muscle spasm of back: Secondary | ICD-10-CM | POA: Diagnosis not present

## 2020-06-26 DIAGNOSIS — M9902 Segmental and somatic dysfunction of thoracic region: Secondary | ICD-10-CM | POA: Diagnosis not present

## 2020-06-26 DIAGNOSIS — M9905 Segmental and somatic dysfunction of pelvic region: Secondary | ICD-10-CM | POA: Diagnosis not present

## 2020-06-26 DIAGNOSIS — M9903 Segmental and somatic dysfunction of lumbar region: Secondary | ICD-10-CM | POA: Diagnosis not present

## 2020-07-01 DIAGNOSIS — F4323 Adjustment disorder with mixed anxiety and depressed mood: Secondary | ICD-10-CM | POA: Diagnosis not present

## 2020-07-01 DIAGNOSIS — F331 Major depressive disorder, recurrent, moderate: Secondary | ICD-10-CM | POA: Diagnosis not present

## 2020-07-23 DIAGNOSIS — F4323 Adjustment disorder with mixed anxiety and depressed mood: Secondary | ICD-10-CM | POA: Diagnosis not present

## 2020-07-23 DIAGNOSIS — F411 Generalized anxiety disorder: Secondary | ICD-10-CM | POA: Diagnosis not present

## 2020-07-23 DIAGNOSIS — F331 Major depressive disorder, recurrent, moderate: Secondary | ICD-10-CM | POA: Diagnosis not present

## 2020-08-24 DIAGNOSIS — M9903 Segmental and somatic dysfunction of lumbar region: Secondary | ICD-10-CM | POA: Diagnosis not present

## 2020-08-24 DIAGNOSIS — M62830 Muscle spasm of back: Secondary | ICD-10-CM | POA: Diagnosis not present

## 2020-08-24 DIAGNOSIS — M9905 Segmental and somatic dysfunction of pelvic region: Secondary | ICD-10-CM | POA: Diagnosis not present

## 2020-08-24 DIAGNOSIS — M9902 Segmental and somatic dysfunction of thoracic region: Secondary | ICD-10-CM | POA: Diagnosis not present

## 2020-08-25 DIAGNOSIS — M1991 Primary osteoarthritis, unspecified site: Secondary | ICD-10-CM | POA: Diagnosis not present

## 2020-08-25 DIAGNOSIS — Z6824 Body mass index (BMI) 24.0-24.9, adult: Secondary | ICD-10-CM | POA: Diagnosis not present

## 2020-09-07 DIAGNOSIS — R972 Elevated prostate specific antigen [PSA]: Secondary | ICD-10-CM | POA: Diagnosis not present

## 2020-09-17 DIAGNOSIS — Z6824 Body mass index (BMI) 24.0-24.9, adult: Secondary | ICD-10-CM | POA: Diagnosis not present

## 2020-09-17 DIAGNOSIS — H6993 Unspecified Eustachian tube disorder, bilateral: Secondary | ICD-10-CM | POA: Diagnosis not present

## 2020-09-17 DIAGNOSIS — Z1331 Encounter for screening for depression: Secondary | ICD-10-CM | POA: Diagnosis not present

## 2020-11-02 DIAGNOSIS — M9907 Segmental and somatic dysfunction of upper extremity: Secondary | ICD-10-CM | POA: Diagnosis not present

## 2020-11-02 DIAGNOSIS — M62830 Muscle spasm of back: Secondary | ICD-10-CM | POA: Diagnosis not present

## 2020-11-02 DIAGNOSIS — M9903 Segmental and somatic dysfunction of lumbar region: Secondary | ICD-10-CM | POA: Diagnosis not present

## 2020-11-02 DIAGNOSIS — M9905 Segmental and somatic dysfunction of pelvic region: Secondary | ICD-10-CM | POA: Diagnosis not present

## 2020-11-02 DIAGNOSIS — M9902 Segmental and somatic dysfunction of thoracic region: Secondary | ICD-10-CM | POA: Diagnosis not present

## 2020-11-16 DIAGNOSIS — M9903 Segmental and somatic dysfunction of lumbar region: Secondary | ICD-10-CM | POA: Diagnosis not present

## 2020-11-16 DIAGNOSIS — M9902 Segmental and somatic dysfunction of thoracic region: Secondary | ICD-10-CM | POA: Diagnosis not present

## 2020-11-16 DIAGNOSIS — M9907 Segmental and somatic dysfunction of upper extremity: Secondary | ICD-10-CM | POA: Diagnosis not present

## 2020-11-16 DIAGNOSIS — M9905 Segmental and somatic dysfunction of pelvic region: Secondary | ICD-10-CM | POA: Diagnosis not present

## 2020-11-16 DIAGNOSIS — M62830 Muscle spasm of back: Secondary | ICD-10-CM | POA: Diagnosis not present

## 2020-11-18 DIAGNOSIS — M9902 Segmental and somatic dysfunction of thoracic region: Secondary | ICD-10-CM | POA: Diagnosis not present

## 2020-11-18 DIAGNOSIS — M9905 Segmental and somatic dysfunction of pelvic region: Secondary | ICD-10-CM | POA: Diagnosis not present

## 2020-11-18 DIAGNOSIS — M9903 Segmental and somatic dysfunction of lumbar region: Secondary | ICD-10-CM | POA: Diagnosis not present

## 2020-11-18 DIAGNOSIS — M62830 Muscle spasm of back: Secondary | ICD-10-CM | POA: Diagnosis not present

## 2020-11-18 DIAGNOSIS — M9907 Segmental and somatic dysfunction of upper extremity: Secondary | ICD-10-CM | POA: Diagnosis not present

## 2020-12-29 DIAGNOSIS — Z6824 Body mass index (BMI) 24.0-24.9, adult: Secondary | ICD-10-CM | POA: Diagnosis not present

## 2020-12-29 DIAGNOSIS — M13811 Other specified arthritis, right shoulder: Secondary | ICD-10-CM | POA: Diagnosis not present

## 2020-12-29 DIAGNOSIS — Z0001 Encounter for general adult medical examination with abnormal findings: Secondary | ICD-10-CM | POA: Diagnosis not present

## 2020-12-29 DIAGNOSIS — E782 Mixed hyperlipidemia: Secondary | ICD-10-CM | POA: Diagnosis not present

## 2020-12-29 DIAGNOSIS — M1991 Primary osteoarthritis, unspecified site: Secondary | ICD-10-CM | POA: Diagnosis not present

## 2020-12-29 DIAGNOSIS — E7849 Other hyperlipidemia: Secondary | ICD-10-CM | POA: Diagnosis not present

## 2020-12-29 DIAGNOSIS — G4730 Sleep apnea, unspecified: Secondary | ICD-10-CM | POA: Diagnosis not present

## 2020-12-29 DIAGNOSIS — Z1389 Encounter for screening for other disorder: Secondary | ICD-10-CM | POA: Diagnosis not present

## 2020-12-29 DIAGNOSIS — M778 Other enthesopathies, not elsewhere classified: Secondary | ICD-10-CM | POA: Diagnosis not present

## 2020-12-29 DIAGNOSIS — E559 Vitamin D deficiency, unspecified: Secondary | ICD-10-CM | POA: Diagnosis not present

## 2020-12-29 DIAGNOSIS — R972 Elevated prostate specific antigen [PSA]: Secondary | ICD-10-CM | POA: Diagnosis not present

## 2020-12-29 DIAGNOSIS — R5383 Other fatigue: Secondary | ICD-10-CM | POA: Diagnosis not present

## 2020-12-30 DIAGNOSIS — M9903 Segmental and somatic dysfunction of lumbar region: Secondary | ICD-10-CM | POA: Diagnosis not present

## 2020-12-30 DIAGNOSIS — M9902 Segmental and somatic dysfunction of thoracic region: Secondary | ICD-10-CM | POA: Diagnosis not present

## 2020-12-30 DIAGNOSIS — M62830 Muscle spasm of back: Secondary | ICD-10-CM | POA: Diagnosis not present

## 2020-12-30 DIAGNOSIS — M9907 Segmental and somatic dysfunction of upper extremity: Secondary | ICD-10-CM | POA: Diagnosis not present

## 2020-12-30 DIAGNOSIS — M9905 Segmental and somatic dysfunction of pelvic region: Secondary | ICD-10-CM | POA: Diagnosis not present

## 2021-01-08 DIAGNOSIS — M9903 Segmental and somatic dysfunction of lumbar region: Secondary | ICD-10-CM | POA: Diagnosis not present

## 2021-01-08 DIAGNOSIS — M9905 Segmental and somatic dysfunction of pelvic region: Secondary | ICD-10-CM | POA: Diagnosis not present

## 2021-01-08 DIAGNOSIS — M9902 Segmental and somatic dysfunction of thoracic region: Secondary | ICD-10-CM | POA: Diagnosis not present

## 2021-01-08 DIAGNOSIS — M62830 Muscle spasm of back: Secondary | ICD-10-CM | POA: Diagnosis not present

## 2021-01-08 DIAGNOSIS — M9907 Segmental and somatic dysfunction of upper extremity: Secondary | ICD-10-CM | POA: Diagnosis not present

## 2021-01-22 DIAGNOSIS — M9902 Segmental and somatic dysfunction of thoracic region: Secondary | ICD-10-CM | POA: Diagnosis not present

## 2021-01-22 DIAGNOSIS — M9907 Segmental and somatic dysfunction of upper extremity: Secondary | ICD-10-CM | POA: Diagnosis not present

## 2021-01-22 DIAGNOSIS — M9905 Segmental and somatic dysfunction of pelvic region: Secondary | ICD-10-CM | POA: Diagnosis not present

## 2021-01-22 DIAGNOSIS — M9903 Segmental and somatic dysfunction of lumbar region: Secondary | ICD-10-CM | POA: Diagnosis not present

## 2021-01-22 DIAGNOSIS — M62830 Muscle spasm of back: Secondary | ICD-10-CM | POA: Diagnosis not present

## 2021-02-04 DIAGNOSIS — G5603 Carpal tunnel syndrome, bilateral upper limbs: Secondary | ICD-10-CM | POA: Insufficient documentation

## 2021-02-04 DIAGNOSIS — M65322 Trigger finger, left index finger: Secondary | ICD-10-CM | POA: Insufficient documentation

## 2021-02-22 DIAGNOSIS — G5622 Lesion of ulnar nerve, left upper limb: Secondary | ICD-10-CM | POA: Diagnosis not present

## 2021-02-22 DIAGNOSIS — R972 Elevated prostate specific antigen [PSA]: Secondary | ICD-10-CM | POA: Diagnosis not present

## 2021-02-22 DIAGNOSIS — G5603 Carpal tunnel syndrome, bilateral upper limbs: Secondary | ICD-10-CM | POA: Diagnosis not present

## 2021-02-24 ENCOUNTER — Ambulatory Visit: Payer: Medicare PPO | Admitting: Podiatry

## 2021-02-24 ENCOUNTER — Encounter: Payer: Self-pay | Admitting: Podiatry

## 2021-02-24 ENCOUNTER — Other Ambulatory Visit: Payer: Self-pay

## 2021-02-24 DIAGNOSIS — M722 Plantar fascial fibromatosis: Secondary | ICD-10-CM | POA: Diagnosis not present

## 2021-02-24 MED ORDER — METHYLPREDNISOLONE 4 MG PO TBPK: ORAL_TABLET | ORAL | 0 refills | Status: DC

## 2021-02-24 MED ORDER — TRIAMCINOLONE ACETONIDE 40 MG/ML IJ SUSP
40.0000 mg | Freq: Once | INTRAMUSCULAR | Status: AC
  Administered 2021-02-24: 14:00:00 40 mg

## 2021-02-24 NOTE — Progress Notes (Signed)
Micheal Baker presents today for follow-up of his Planter fasciitis bilateral.  He states that he has been doing fine until just recently and he started to develop some plantar lateral aspect of pain along the left foot his also has some pain in the calves has had pains in the knees and hips as well as the elbows and hands.  Recently been diagnosed with carpal tunnel syndrome has been taking Celebrex and meloxicam alternating occasionally.  Currently working part-time.  Objective: Vital signs are stable he is alert oriented x3 I reviewed his past medical history medications allergies surgery social history review of systems.  Pulses are palpable bilateral.  Mild cavus foot deformity bilateral.  Pain on palpation medial calcaneal tubercles bilateral.  No pain on frontal plane range of motion no pain reproducible on medial lateral compression of the calcaneus or the fifth metatarsals bilateral no pain on palpation of the Achilles or gastrosoleus complex.  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  Plan: Reinjected the bilateral heels today 20 mg Kenalog 5 mg Marcaine point of maximal tenderness started him on a Medrol Dosepak and I will follow-up with him in 1 month if necessary.  We once again discussed appropriate shoe gear stretching exercises ice therapy and shoe gear modifications.

## 2021-03-04 ENCOUNTER — Ambulatory Visit (INDEPENDENT_AMBULATORY_CARE_PROVIDER_SITE_OTHER): Payer: Medicare PPO

## 2021-03-04 ENCOUNTER — Ambulatory Visit: Payer: Medicare PPO | Admitting: Orthopaedic Surgery

## 2021-03-04 VITALS — Ht 76.0 in | Wt 200.0 lb

## 2021-03-04 DIAGNOSIS — M25551 Pain in right hip: Secondary | ICD-10-CM

## 2021-03-04 DIAGNOSIS — G5603 Carpal tunnel syndrome, bilateral upper limbs: Secondary | ICD-10-CM | POA: Diagnosis not present

## 2021-03-04 NOTE — Progress Notes (Signed)
Office Visit Note   Patient: Micheal Baker           Date of Birth: 1946/09/05           MRN: [id number] Visit Date: 03/04/2021              Requested by: [place] [address],  [place] [address] PCP: [facility]   Assessment & Plan: Visit Diagnoses:  1. Pain in right hip     Plan: Since this is only been hurting for a very short amount of time, we will continue to watch this closely.  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  If this worsens at all he will let us know and we will order that study.  All questions and concerns were answered and addressed.  Follow-Up Instructions: Return if symptoms worsen or fail to improve.   Orders:  Orders Placed This Encounter  Procedures   XR HIP UNILAT W OR W/O PELVIS 1V RIGHT   No orders of the defined types were placed in this encounter.     Procedures: No procedures performed   Clinical Data: No additional findings.   Subjective: Chief Complaint  Patient presents with   Right Hip - Pain  The patient is a very pleasant 75 year old gentleman who comes in with about a week worth of right hip and groin pain.  He points to actually just below the anterior superior iliac spine area and then toward the hip as a source of his pain on that right side.  He denies any recent injuries.  He has been dealing with plantar fasciitis as well.  He denies any significant back issues.  There is been no acute change in medical status.  He is an active 75 year old gentleman.  He does report a history of a right hip mass that was removed in 2009.  This was done at [facility].  I was able to actually review a MRI from 2009 of his right hip and pelvis.  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  He does report he had had some pain after that that may have been  related to the surgery and being in close proximity to the lateral femoral cutaneous nerve area.  HPI  Review of Systems He currently denies any headache, chest pain, shortness of breath, fever, chills, nausea, vomiting  Objective: Vital Signs: Ht 6\' 4"  (1.93 m)   Wt 200 lb (90.7 kg)   BMI 24.34 kg/m   Physical Exam He is alert and orient x3 and in no acute distress Ortho Exam Examination of his right hip shows full and fluid range of motion with no abnormalities on exam of the right hip at all.  There is some deep pain there is minimal along what I feel may be the tensor fascia or even the hip flexor tendon. Specialty Comments:  No specialty comments available.  Imaging: XR HIP UNILAT W OR W/O PELVIS 1V RIGHT  Result Date: 03/04/2021 An AP pelvis and lateral of the right hip shows no acute findings.  The joint space is well-maintained.  There are no cortical irregularities around the acetabulum or femur on the side.  There is a small metallic surgical clip that is likely related to previous surgery.    PMFS History: Patient Active Problem List  Diagnosis Date Noted   Acute lateral meniscus tear of left knee 06/27/2014   Acute medial meniscus tear of left knee    NSVT (nonsustained ventricular tachycardia) ([facility])    Bradycardia 06/10/2014   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   Cough 10/03/2012   Chest pain 08/08/2011   Benign hypertension 08/08/2011   Hypothyroidism 08/08/2011   Past Medical History:  Diagnosis Date   Acute medial meniscus tear of left knee    Depression    Frequent PVCs    Hypertension    Hypothyroidism    NSVT (nonsustained ventricular tachycardia) ([place])    Osteoarthritis    Thyroid disease     Family History  Problem Relation Age of Onset   Hypertension Mother    Hypertension Father    Heart attack Father    Diabetes Father    Asthma Daughter     Past Surgical History:  Procedure Laterality Date   CATARACT EXTRACTION     left   COLONOSCOPY N/A  04/08/2016   Procedure: COLONOSCOPY;  Surgeon: Danie Binder, MD;  [facility];  Service: Endoscopy;  Laterality: N/A;  11:15 AM   EYE SURGERY     left-scar tissue   groin surgery  2010   growth removed    KNEE ARTHROSCOPY     left   KNEE ARTHROSCOPY WITH LATERAL MENISECTOMY Right 03/11/2015   Procedure: RIGHT KNEE ARTHROSCOPY WITH MEDIALAND LATERAL MENISECTOMY;  Surgeon: Elsie Saas, MD;  Location: [place];  Service: Orthopedics;  Laterality: Right;   KNEE ARTHROSCOPY WITH MEDIAL MENISECTOMY Left 06/27/2014   Procedure: LEFT KNEE ARTHROSCOPY WITH PARTIAL MEDIAL AND LATERAL MENISECTOMIES AND CHONDROPLASTY;  Surgeon: Lorn Junes, MD;  Location: [place];  Service: Orthopedics;  Laterality: Left;   KNEE ARTHROSCOPY WITH MEDIAL MENISECTOMY Right 03/11/2015   Procedure: KNEE ARTHROSCOPY WITH MEDIAL MENISECTOMY;  Surgeon: Elsie Saas, MD;  Location: [place];  Service: Orthopedics;  Laterality: Right;   LEFT HEART CATHETERIZATION WITH CORONARY ANGIOGRAM N/A 09/14/2011   Procedure: LEFT HEART CATHETERIZATION WITH CORONARY ANGIOGRAM;  Surgeon: Pixie Casino, MD;  Location: [facility] CATH LAB;  Service: Cardiovascular;  Laterality: N/A;   POLYPECTOMY  04/08/2016   Procedure: POLYPECTOMY;  Surgeon: Danie Binder, MD;  [facility];  Service: Endoscopy;;  colon    RETINAL DETACHMENT SURGERY Left 2002   Dr. 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

## 2021-03-09 DIAGNOSIS — R351 Nocturia: Secondary | ICD-10-CM | POA: Diagnosis not present

## 2021-03-09 DIAGNOSIS — R972 Elevated prostate specific antigen [PSA]: Secondary | ICD-10-CM | POA: Diagnosis not present

## 2021-03-09 DIAGNOSIS — N401 Enlarged prostate with lower urinary tract symptoms: Secondary | ICD-10-CM | POA: Diagnosis not present

## 2021-04-01 ENCOUNTER — Ambulatory Visit: Payer: Medicare PPO | Admitting: Podiatry

## 2021-04-01 ENCOUNTER — Other Ambulatory Visit: Payer: Self-pay

## 2021-04-01 ENCOUNTER — Encounter: Payer: Self-pay | Admitting: Podiatry

## 2021-04-01 DIAGNOSIS — M722 Plantar fascial fibromatosis: Secondary | ICD-10-CM

## 2021-04-01 MED ORDER — TRIAMCINOLONE ACETONIDE 40 MG/ML IJ SUSP
40.0000 mg | Freq: Once | INTRAMUSCULAR | Status: AC
  Administered 2021-04-01: 40 mg

## 2021-04-03 NOTE — Progress Notes (Signed)
He presents today for follow-up of his Planter fasciitis bilaterally.  States that he is 60 to 70% improved states that they are much better than they were a month ago.  Objective: Vital signs are stable he is alert and oriented x3 still has some tenderness on palpation medial calcaneal tubercle bilateral.  Assessment: Planter fasciitis resolving 60 to 70%.  Plan: Offered to reinject him today which he agreed to.  I reinjected the bilateral heels 20 mg Kenalog 5 mg Marcaine point of maximal tenderness.  Most likely this will resolve his problem.  He will continue the plantar fascial brace wear his shoes.  Follow-up with him in 1 month if necessary.

## 2021-04-06 ENCOUNTER — Encounter (INDEPENDENT_AMBULATORY_CARE_PROVIDER_SITE_OTHER): Payer: Self-pay | Admitting: Ophthalmology

## 2021-04-06 ENCOUNTER — Other Ambulatory Visit: Payer: Self-pay

## 2021-04-06 ENCOUNTER — Ambulatory Visit (INDEPENDENT_AMBULATORY_CARE_PROVIDER_SITE_OTHER): Payer: Medicare PPO | Admitting: Ophthalmology

## 2021-04-06 DIAGNOSIS — H25811 Combined forms of age-related cataract, right eye: Secondary | ICD-10-CM | POA: Diagnosis not present

## 2021-04-06 DIAGNOSIS — Z961 Presence of intraocular lens: Secondary | ICD-10-CM

## 2021-04-06 DIAGNOSIS — H3581 Retinal edema: Secondary | ICD-10-CM | POA: Diagnosis not present

## 2021-04-06 DIAGNOSIS — Z8669 Personal history of other diseases of the nervous system and sense organs: Secondary | ICD-10-CM

## 2021-04-06 NOTE — Progress Notes (Signed)
[facility] Note  04/06/2021     CHIEF COMPLAINT Patient presents for Retina Follow Up   HISTORY OF PRESENT ILLNESS: Micheal Baker is a 75 y.o. male who presents to the clinic today for:  No issues reported with vision HPI     Retina Follow Up   Patient presents with  Dry AMD.  In both eyes.  This started 9 months ago.  I, the attending physician,  performed the HPI with the patient and updated documentation appropriately.        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 Patient states vision the same. No eye pain.       Last edited by Bernarda Caffey, MD on 04/06/2021  1:04 PM.     Referring physician: [facility] [address],  [place] [address]  HISTORICAL INFORMATION:   Selected notes from the MEDICAL RECORD NUMBER     CURRENT MEDICATIONS: No current outpatient medications on file. (Ophthalmic Drugs)   No current facility-administered medications for this visit. (Ophthalmic Drugs)   Current Outpatient Medications (Other)  Medication Sig   ALPHA LIPOIC ACID PO Take 1 tablet by mouth daily.   B Complex-C (B-COMPLEX WITH VITAMIN C) tablet Take 1 tablet by mouth 2 (two) times daily.    celecoxib (CELEBREX) 200 MG capsule Take 200 mg by mouth daily.   escitalopram (LEXAPRO) 20 MG tablet Take 20 mg by mouth daily.   levothyroxine (SYNTHROID, LEVOTHROID) 75 MCG tablet Take 1 tablet by mouth daily.   meloxicam (MOBIC) 15 MG tablet Take 1 tablet (15 mg total) by mouth daily.   tamsulosin (FLOMAX) 0.4 MG CAPS capsule Take 0.4 mg by mouth daily.   Vitamin D, Ergocalciferol, (DRISDOL) 1.25 MG (50000 UNIT) CAPS capsule Take 50,000 Units by mouth once a week.   No current facility-administered medications for this visit. (Other)      REVIEW OF SYSTEMS: ROS   Positive for: Eyes Negative for: Constitutional, Gastrointestinal, Neurological, Skin, Genitourinary,  Musculoskeletal, HENT, Endocrine, Cardiovascular, Respiratory, Psychiatric, Allergic/Imm, Heme/Lymph Last edited by Theodore Demark, COA on 04/06/2021 12:57 PM.        ALLERGIES Allergies  Allergen Reactions   Avelox [Moxifloxacin]    Penicillins     chills    PAST MEDICAL HISTORY Past Medical History:  Diagnosis Date   Acute medial meniscus tear of left knee    Depression    Frequent PVCs    Hypertension    Hypothyroidism    NSVT (nonsustained ventricular tachycardia) (HCC)    Osteoarthritis    Thyroid disease    Past Surgical History:  Procedure Laterality Date   CATARACT EXTRACTION     left   COLONOSCOPY N/A 04/08/2016   Procedure: COLONOSCOPY;  Surgeon: Danie Binder, MD;  [facility];  Service: Endoscopy;  Laterality: N/A;  11:15 AM   EYE SURGERY     left-scar tissue   groin surgery  2010   growth removed    KNEE ARTHROSCOPY     left   KNEE ARTHROSCOPY WITH LATERAL MENISECTOMY Right 03/11/2015   Procedure: RIGHT KNEE ARTHROSCOPY WITH MEDIALAND LATERAL MENISECTOMY;  Surgeon: Elsie Saas, MD;  Location: [place];  Service: Orthopedics;  Laterality: Right;   KNEE ARTHROSCOPY WITH MEDIAL MENISECTOMY Left 06/27/2014   Procedure: LEFT KNEE ARTHROSCOPY WITH PARTIAL MEDIAL AND LATERAL MENISECTOMIES AND CHONDROPLASTY;  Surgeon: Lorn Junes, MD;  Location: [facility];  Service: Orthopedics;  Laterality: Left;   KNEE ARTHROSCOPY WITH MEDIAL MENISECTOMY Right 03/11/2015   Procedure: KNEE ARTHROSCOPY WITH MEDIAL MENISECTOMY;  Surgeon: Elsie Saas, MD;  Location: [place];  Service: Orthopedics;  Laterality: Right;   LEFT HEART CATHETERIZATION WITH CORONARY ANGIOGRAM N/A 09/14/2011   Procedure: LEFT HEART CATHETERIZATION WITH CORONARY ANGIOGRAM;  Surgeon: Pixie Casino, MD;  Location: [facility] CATH LAB;  Service: Cardiovascular;  Laterality: N/A;   POLYPECTOMY  04/08/2016   Procedure: POLYPECTOMY;  Surgeon: Danie Binder, MD;  [facility];  Service: Endoscopy;;  colon    RETINAL DETACHMENT SURGERY Left 2002   Dr. Merlene Morse    FAMILY HISTORY Family History  Problem Relation Age of Onset   Hypertension Mother    Hypertension Father    Heart attack Father    Diabetes Father    Asthma Daughter     SOCIAL HISTORY Social History   Tobacco Use   Smoking status: Never   Smokeless tobacco: Never   Tobacco comments:    06/10/14 1968- Tried it once and didn't like it- AJ  Vaping Use   Vaping Use: Never used  Substance Use Topics   Alcohol use: Yes    Alcohol/week: 0.0 standard drinks    Comment: occasionally   Drug use: No         OPHTHALMIC EXAM:  Base Eye Exam     Visual Acuity (Snellen - Linear)       Right Left   Dist Tat [place] 20/25 +2 20/70 -2   Dist ph [place] NI 20/40 -1         Tonometry (Tonopen, 12:54 PM)       Right Left   Pressure 17 13         Pupils       Dark Light Shape React APD   Right 3 2 Round Brisk None   Left 3 2 Round Brisk None         Visual Fields (Counting fingers)       Left Right    Full Full         Extraocular Movement       Right Left    Full, Ortho Full, Ortho         Neuro/Psych     Oriented x3: Yes   Mood/Affect: Normal         Dilation     Both eyes: 1.0% Mydriacyl, 2.5% Phenylephrine @ 12:54 PM           Slit Lamp and Fundus Exam     Slit Lamp Exam       Right Left   Lids/Lashes Dermato, mild MGD Dermato   Conjunctiva/Sclera White and quiet White and quiet   Cornea Arcus Arcus, well healed temporal cataract wound   Anterior Chamber Deep and Clear Deep and Clear   Iris Round and dilated Round and poorly dilated to 3.6m, iridodinesis   Lens 2-3+ NS, 2-3+ Cortical PCIOL   Vitreous Syneresis, PVD, mild condensations (greatest inferiorly) Syneresis         Fundus Exam       Right Left   Disc Pink and sharp Mild pallor, sharp rim   C/D Ratio 0.3 0.4   Macula Flat, blunted foveal reflex,  mild RPE mottling, no heme or edema Flat, blunted foveal reflex, mild RPE mottling.  No heme or edema   Vessels Mild attenuation Mild attenuation   Periphery Laser  scars from 0800-1000 surrounding focal detachment--good barricade in place; otherwise attached.  No new RT/RD. Attached; CR scarring from 1030-600 clockwise. ?scleral buckle.  Limited view due to poor dilation.  No new RT/RD.            IMAGING AND PROCEDURES  Imaging and Procedures for 04/06/2021  OCT, Retina - OU - Both Eyes       Right Eye Quality was good. Central Foveal Thickness: 289. Progression has been stable. Findings include normal foveal contour, no SRF, no IRF.   Left Eye Quality was good. Central Foveal Thickness: 338. Progression has been stable. Findings include abnormal foveal contour, no IRF, no SRF, macular pucker, outer retinal atrophy (Irregular inner-retinal surface. Focal CR atrophy temporal macula caught on widefield.).   Notes *Images captured and stored on drive  Diagnosis / Impression: History of RD OU -- retina attached OU OD: NFP; No IRF/SRF OS: Irregular inner-retinal surface.  Focal CR atrophy temporal macula caught on widefield.   Clinical management:  See below  Abbreviations: NFP - Normal foveal profile. CME - cystoid macular edema. PED - pigment epithelial detachment. IRF - intraretinal fluid. SRF - subretinal fluid. EZ - ellipsoid zone. ERM - epiretinal membrane. ORA - outer retinal atrophy. ORT - outer retinal tubulation. SRHM - subretinal hyper-reflective material. IRHM - intraretinal hyper-reflective material           ASSESSMENT/PLAN:    ICD-10-CM   1. History of retinal detachment  Z86.69     2. Retinal edema  H35.81 OCT, Retina - OU - Both Eyes    3. Combined forms of age-related cataract of right eye  H25.811     4. Pseudophakia  Z96.1       1,2. Hx of RD OU  - OD focal RD 8-10 oclock s/p laser retinopexy / barricade - early 2000s, Dr. Weston Brass  - OS s/p RD  repair early 2000s, Dr. Weston Brass  - has been followed by Dr. Randell Loop  - transferred care here due to proximity and transportation  - RD repairs stable OU  - no new RT/RD OU             - F/u in 1 yr, sooner prn -- DFE/OCT    3. Mixed Cataract OD - The symptoms of cataract, surgical options, and treatments and risks were discussed with patient. - discussed diagnosis and progression - monitor  4. 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     Return in about 1 year (around 04/06/2022) for 1 yr f/u w/DFE/OCT.  There are no Patient Instructions on file for this visit.  Explained the diagnoses, plan, and follow up with the patient and they expressed understanding.  Patient expressed understanding of the importance of proper follow up care.  This document serves as a record of services personally performed by Gardiner Sleeper, MD, PhD. It was created on their behalf by Estill Bakes, COT an ophthalmic technician. The creation of this record is the provider's dictation and/or activities during the visit.    Electronically signed by: Estill Bakes, COT 7.26.22 @ 3:55 PM   Gardiner Sleeper, M.D., Ph.D. Diseases & Surgery of the Retina and [place] 7.26.22  I have reviewed the above documentation for accuracy and completeness, and I agree  with the above. Gardiner Sleeper, M.D., Ph.D. 04/06/21 3:55 PM   Abbreviations: M myopia (nearsighted); A astigmatism; H hyperopia (farsighted); P presbyopia; Mrx spectacle prescription;  CTL contact lenses; OD right eye; OS left eye; OU both eyes  XT exotropia; ET esotropia; PEK punctate epithelial keratitis; PEE punctate epithelial erosions; DES dry eye syndrome; MGD meibomian gland dysfunction; ATs artificial tears; PFAT's preservative free  artificial tears; [place] nuclear sclerotic cataract; PSC posterior subcapsular cataract; ERM epi-retinal membrane; PVD posterior vitreous detachment; RD retinal detachment; DM diabetes mellitus; DR diabetic retinopathy; NPDR non-proliferative diabetic retinopathy; PDR proliferative diabetic retinopathy; CSME clinically significant macular edema; DME diabetic macular edema; dbh dot blot hemorrhages; CWS cotton wool spot; POAG primary open angle glaucoma; C/D cup-to-disc ratio; HVF humphrey visual field; GVF goldmann visual field; OCT optical coherence tomography; IOP intraocular pressure; BRVO Branch retinal vein occlusion; CRVO central retinal vein occlusion; CRAO central retinal artery occlusion; BRAO branch retinal artery occlusion; RT retinal tear; SB scleral buckle; PPV pars plana vitrectomy; VH Vitreous hemorrhage; PRP panretinal laser photocoagulation; IVK intravitreal kenalog; VMT vitreomacular traction; MH Macular hole;  NVD neovascularization of the disc; NVE neovascularization elsewhere; AREDS age related eye disease study; ARMD age related macular degeneration; POAG primary open angle glaucoma; EBMD epithelial/anterior basement membrane dystrophy; ACIOL anterior chamber intraocular lens; IOL intraocular lens; PCIOL posterior chamber intraocular lens; Phaco/IOL phacoemulsification with intraocular lens placement; [place] photorefractive keratectomy; LASIK laser assisted in situ keratomileusis; HTN hypertension; DM diabetes mellitus; COPD chronic obstructive pulmonary disease

## 2021-05-04 DIAGNOSIS — G5603 Carpal tunnel syndrome, bilateral upper limbs: Secondary | ICD-10-CM | POA: Diagnosis not present

## 2021-05-04 DIAGNOSIS — G5601 Carpal tunnel syndrome, right upper limb: Secondary | ICD-10-CM | POA: Diagnosis not present

## 2021-05-06 ENCOUNTER — Ambulatory Visit: Payer: Medicare PPO | Admitting: Podiatry

## 2021-05-12 ENCOUNTER — Emergency Department (HOSPITAL_COMMUNITY): Payer: Medicare PPO

## 2021-05-12 ENCOUNTER — Emergency Department (HOSPITAL_COMMUNITY)
Admission: EM | Admit: 2021-05-12 | Discharge: 2021-05-12 | Disposition: A | Discharge status: Home / Self Care | Payer: Medicare PPO | Attending: Emergency Medicine | Admitting: Emergency Medicine

## 2021-05-12 ENCOUNTER — Other Ambulatory Visit: Payer: Self-pay

## 2021-05-12 ENCOUNTER — Encounter (HOSPITAL_COMMUNITY): Payer: Self-pay

## 2021-05-12 DIAGNOSIS — R0602 Shortness of breath: Secondary | ICD-10-CM | POA: Diagnosis not present

## 2021-05-12 DIAGNOSIS — Z20822 Contact with and (suspected) exposure to covid-19: Secondary | ICD-10-CM | POA: Diagnosis not present

## 2021-05-12 DIAGNOSIS — H8110 Benign paroxysmal vertigo, unspecified ear: Secondary | ICD-10-CM | POA: Insufficient documentation

## 2021-05-12 DIAGNOSIS — I619 Nontraumatic intracerebral hemorrhage, unspecified: Secondary | ICD-10-CM | POA: Diagnosis not present

## 2021-05-12 DIAGNOSIS — E039 Hypothyroidism, unspecified: Secondary | ICD-10-CM | POA: Insufficient documentation

## 2021-05-12 DIAGNOSIS — I10 Essential (primary) hypertension: Secondary | ICD-10-CM | POA: Insufficient documentation

## 2021-05-12 DIAGNOSIS — Z79899 Other long term (current) drug therapy: Secondary | ICD-10-CM | POA: Insufficient documentation

## 2021-05-12 DIAGNOSIS — R519 Headache, unspecified: Secondary | ICD-10-CM | POA: Diagnosis not present

## 2021-05-12 DIAGNOSIS — R42 Dizziness and giddiness: Secondary | ICD-10-CM | POA: Diagnosis not present

## 2021-05-12 DIAGNOSIS — R55 Syncope and collapse: Secondary | ICD-10-CM | POA: Diagnosis not present

## 2021-05-12 LAB — CBC WITH DIFFERENTIAL/PLATELET
Abs Immature Granulocytes: 0.02 10*3/uL (ref 0.00–0.07)
Basophils Absolute: 0 10*3/uL (ref 0.0–0.1)
Basophils Relative: 0 %
Eosinophils Absolute: 0.2 10*3/uL (ref 0.0–0.5)
Eosinophils Relative: 3 %
HCT: 42.7 % (ref 39.0–52.0)
Hemoglobin: 14.8 g/dL (ref 13.0–17.0)
Immature Granulocytes: 0 %
Lymphocytes Relative: 17 %
Lymphs Abs: 1 10*3/uL (ref 0.7–4.0)
MCH: 30.8 pg (ref 26.0–34.0)
MCHC: 34.7 g/dL (ref 30.0–36.0)
MCV: 88.8 fL (ref 80.0–100.0)
Monocytes Absolute: 0.4 10*3/uL (ref 0.1–1.0)
Monocytes Relative: 8 %
Neutro Abs: 4.1 10*3/uL (ref 1.7–7.7)
Neutrophils Relative %: 72 %
Platelets: 252 10*3/uL (ref 150–400)
RBC: 4.81 MIL/uL (ref 4.22–5.81)
RDW: 13 % (ref 11.5–15.5)
WBC: 5.7 10*3/uL (ref 4.0–10.5)
nRBC: 0 % (ref 0.0–0.2)

## 2021-05-12 LAB — URINALYSIS, ROUTINE W REFLEX MICROSCOPIC
Bilirubin Urine: NEGATIVE
Glucose, UA: NEGATIVE mg/dL
Hgb urine dipstick: NEGATIVE
Ketones, ur: 20 mg/dL — AB
Leukocytes,Ua: NEGATIVE
Nitrite: NEGATIVE
Protein, ur: NEGATIVE mg/dL
Specific Gravity, Urine: 1.013 (ref 1.005–1.030)
pH: 7 (ref 5.0–8.0)

## 2021-05-12 LAB — COMPREHENSIVE METABOLIC PANEL
ALT: 37 U/L (ref 0–44)
AST: 44 U/L — ABNORMAL HIGH (ref 15–41)
Albumin: 4.1 g/dL (ref 3.5–5.0)
Alkaline Phosphatase: 51 U/L (ref 38–126)
Anion gap: 10 (ref 5–15)
BUN: 21 mg/dL (ref 8–23)
CO2: 23 mmol/L (ref 22–32)
Calcium: 9.7 mg/dL (ref 8.9–10.3)
Chloride: 103 mmol/L (ref 98–111)
Creatinine, Ser: 1.14 mg/dL (ref 0.61–1.24)
GFR, Estimated: >60 mL/min (ref >60)
Glucose, Bld: 90 mg/dL (ref 70–99)
Potassium: 3.7 mmol/L (ref 3.5–5.1)
Sodium: 136 mmol/L (ref 135–145)
Total Bilirubin: 1.5 mg/dL — ABNORMAL HIGH (ref 0.3–1.2)
Total Protein: 6.9 g/dL (ref 6.5–8.1)

## 2021-05-12 LAB — TROPONIN I (HIGH SENSITIVITY)
Troponin I (High Sensitivity): 8 ng/L (ref <18)
Troponin I (High Sensitivity): 8 ng/L (ref <18)

## 2021-05-12 LAB — TSH: TSH: 2.23 u[IU]/mL (ref 0.350–4.500)

## 2021-05-12 LAB — RESP PANEL BY RT-PCR (FLU A&B, COVID) ARPGX2
Influenza A by PCR: NEGATIVE
Influenza B by PCR: NEGATIVE
SARS Coronavirus 2 by RT PCR: NEGATIVE

## 2021-05-12 LAB — CBG MONITORING, ED: Glucose-Capillary: 89 mg/dL (ref 70–99)

## 2021-05-12 MED ORDER — MECLIZINE HCL 25 MG PO TABS: 25.0000 mg | ORAL_TABLET | Freq: Three times a day (TID) | ORAL | 0 refills | Status: DC | PRN

## 2021-05-12 MED ORDER — LORAZEPAM 1 MG PO TABS
0.5000 mg | ORAL_TABLET | Freq: Once | ORAL | Status: AC
  Administered 2021-05-12: 0.5 mg via ORAL
  Filled 2021-05-12: qty 1

## 2021-05-12 MED ORDER — LORAZEPAM 0.5 MG PO TABS: 0.5000 mg | ORAL_TABLET | Freq: Three times a day (TID) | ORAL | 0 refills | Status: DC | PRN

## 2021-05-12 MED ORDER — SODIUM CHLORIDE 0.9 % IV BOLUS
1000.0000 mL | Freq: Once | INTRAVENOUS | Status: AC
Start: 2021-05-12 — End: 2021-05-12
  Administered 2021-05-12: 1000 mL via INTRAVENOUS

## 2021-05-12 MED ORDER — ACETAMINOPHEN 325 MG PO TABS
650.0000 mg | ORAL_TABLET | Freq: Once | ORAL | Status: AC
  Administered 2021-05-12: 650 mg via ORAL
  Filled 2021-05-12: qty 2

## 2021-05-12 NOTE — ED Notes (Signed)
Patient transported to X-ray 

## 2021-05-12 NOTE — ED Triage Notes (Signed)
Pt reports sudden onset of dizziness while at lunch today, had a near syncopal episode and had to sit down in the nearest booth. Pt also c.o headache that started since then. Still feeling dizzy at this time, denies nausea. Pt tachypneic in triage, seems a little anxious. No neuro deficits noted in triage. Pt a.o

## 2021-05-12 NOTE — ED Provider Notes (Signed)
Emergency Medicine Provider Triage Evaluation Note  GEVIN ORTON , a 75 y.o. male  was evaluated in triage.  Pt complains of near syncopal event while trying to stand up earlier today. He slumped down into a chair, without LOC, head injury. Patient reports he is weak, diaphoretic, SOB. No chest pain, no nausea, no muscle weakness.  Review of Systems  Positive: As above Negative: As above  Physical Exam  BP 132/89 (BP Location: Left Arm)   Pulse 60   Temp 97.8 F (36.6 C) (Oral)   Resp 19   Ht '6\' 4"'$  (1.93 m)   Wt 90.7 kg   SpO2 98%   BMI 24.34 kg/m  Gen:   Awake, no distress , diaphoretic Resp:  Normal effort  MSK:   Moves extremities without difficulty  Other:    Medical Decision Making  Medically screening exam initiated at 1:50 PM.  Appropriate orders placed.  GUERIN VELARDE was informed that the remainder of the evaluation will be completed by another provider, this initial triage assessment does not replace that evaluation, and the importance of remaining in the ED until their evaluation is complete.  Near syncope   Dorien Chihuahua 05/12/21 1351    Daleen Bo, MD 05/13/21 [phone]

## 2021-05-12 NOTE — ED Provider Notes (Signed)
[facility] EMERGENCY DEPARTMENT Provider Note   CSN: [id number] Arrival date & time: 05/12/21  1323     History Chief Complaint  Patient presents with   Dizziness   Near Syncope   Headache    Micheal Baker is a 75 y.o. male.  Pt presents to the ED today with dizziness and headache.  Pt said he was ordering lunch and had a sudden onset of dizziness. He felt that he was going to pass out.  Pt has some sob as well.  Pt denies any f/c.  No known sick contacts.  Pt said he is feeling a little better now, but still dizzy.  He had orthostatics done and was symptomatic with standing.      Past Medical History:  Diagnosis Date   Acute medial meniscus tear of left knee    Depression    Frequent PVCs    Hypertension    Hypothyroidism    NSVT (nonsustained ventricular tachycardia) ([place])    Osteoarthritis    Thyroid disease     Patient Active Problem List   Diagnosis Date Noted   Acute lateral meniscus tear of left knee 06/27/2014   Acute medial meniscus tear of left knee    NSVT (nonsustained ventricular tachycardia) (HCC)    Bradycardia 06/10/2014   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   Cough 10/03/2012   Chest pain 08/08/2011   Benign hypertension 08/08/2011   Hypothyroidism 08/08/2011    Past Surgical History:  Procedure Laterality Date   CATARACT EXTRACTION     left   COLONOSCOPY N/A 04/08/2016   Procedure: COLONOSCOPY;  Surgeon: Danie Binder, MD;  [facility];  Service: Endoscopy;  Laterality: N/A;  11:15 AM   EYE SURGERY     left-scar tissue   groin surgery  2010   growth removed    KNEE ARTHROSCOPY     left   KNEE ARTHROSCOPY WITH LATERAL MENISECTOMY Right 03/11/2015   Procedure: RIGHT KNEE ARTHROSCOPY WITH MEDIALAND LATERAL MENISECTOMY;  Surgeon: Elsie Saas, MD;  Location: [place];  Service: Orthopedics;  Laterality: Right;   KNEE ARTHROSCOPY WITH MEDIAL MENISECTOMY Left 06/27/2014   Procedure: LEFT KNEE ARTHROSCOPY  WITH PARTIAL MEDIAL AND LATERAL MENISECTOMIES AND CHONDROPLASTY;  Surgeon: Lorn Junes, MD;  Location: [place];  Service: Orthopedics;  Laterality: Left;   KNEE ARTHROSCOPY WITH MEDIAL MENISECTOMY Right 03/11/2015   Procedure: KNEE ARTHROSCOPY WITH MEDIAL MENISECTOMY;  Surgeon: Elsie Saas, MD;  Location: [place];  Service: Orthopedics;  Laterality: Right;   LEFT HEART CATHETERIZATION WITH CORONARY ANGIOGRAM N/A 09/14/2011   Procedure: LEFT HEART CATHETERIZATION WITH CORONARY ANGIOGRAM;  Surgeon: Pixie Casino, MD;  Location: [facility] CATH [facility];  Service: Cardiovascular;  Laterality: N/A;   POLYPECTOMY  04/08/2016   Procedure: POLYPECTOMY;  Surgeon: Danie Binder, MD;  [facility];  Service: Endoscopy;;  colon    RETINAL DETACHMENT SURGERY Left 2002   Dr. Merlene Morse       Family History  Problem Relation Age of Onset   Hypertension Mother    Hypertension Father    Heart attack Father    Diabetes Father    Asthma Daughter     Social History   Tobacco Use   Smoking status: Never   Smokeless tobacco: Never   Tobacco comments:    06/10/14 1968- Tried it once and didn't like it- AJ  Vaping Use   Vaping Use: Never used  Substance Use Topics  Alcohol use: Yes    Alcohol/week: 0.0 standard drinks    Comment: occasionally   Drug use: No    Home Medications Prior to Admission medications   Medication Sig Start Date End Date Taking? Authorizing Provider  acetaminophen (TYLENOL) 500 MG tablet Take 500-1,000 mg by mouth every 6 (six) hours as needed for mild pain or headache.   Yes [provider]  ALPHA LIPOIC ACID PO Take 1 capsule by mouth daily.   Yes [provider]  B Complex-C (B-COMPLEX WITH VITAMIN C) tablet Take 1 tablet by mouth in the morning.   Yes [provider]  celecoxib (CELEBREX) 200 MG capsule Take 200 mg by mouth daily as needed for mild pain.   Yes [provider]  Cholecalciferol  (VITAMIN D3) 50 MCG (2000 UT) TABS Take 2,000 Units by mouth daily in the afternoon.   Yes [provider]  escitalopram (LEXAPRO) 20 MG tablet Take 20 mg by mouth daily.   Yes [provider]  levothyroxine (SYNTHROID, LEVOTHROID) 75 MCG tablet Take 1 tablet by mouth daily before breakfast. 03/27/15  Yes [provider]  LORazepam (ATIVAN) 0.5 MG tablet Take 1 tablet (0.5 mg total) by mouth every 8 (eight) hours as needed (dizziness). 05/12/21  Yes Isla Pence, MD  meclizine (ANTIVERT) 25 MG tablet Take 1 tablet (25 mg total) by mouth 3 (three) times daily as needed for dizziness. 05/12/21  Yes Isla Pence, MD  meloxicam (MOBIC) 15 MG tablet Take 1 tablet (15 mg total) by mouth daily. Patient taking differently: Take 15 mg by mouth daily as needed for pain. 04/16/20  Yes Hyatt, Max T, DPM  tamsulosin (FLOMAX) 0.4 MG CAPS capsule Take 0.4 mg by mouth at bedtime.   Yes [provider]    Allergies    Avelox [moxifloxacin] and Penicillins  Review of Systems   Review of Systems  Neurological:  Positive for dizziness.  All other systems reviewed and are negative.  Physical Exam Updated Vital Signs BP (!) 162/91   Pulse (!) 58   Temp 97.8 F (36.6 C) (Oral)   Resp 16   Ht '6\' 4"'$  (1.93 m)   Wt 90.7 kg   SpO2 99%   BMI 24.34 kg/m   Physical Exam Vitals and nursing note reviewed.  Constitutional:      Appearance: He is well-developed.  HENT:     Head: Normocephalic and atraumatic.     Mouth/Throat:     Mouth: Mucous membranes are moist.     Pharynx: Oropharynx is clear.  Eyes:     Extraocular Movements: Extraocular movements intact.     Pupils: Pupils are equal, round, and reactive to light.  Cardiovascular:     Rate and Rhythm: Normal rate. Rhythm irregular.     Heart sounds: Normal heart sounds.     Comments: + PVCs Pulmonary:     Effort: Pulmonary effort is normal.     Breath sounds: Normal breath sounds.  Abdominal:     General:  Bowel sounds are normal.     Palpations: Abdomen is soft.  Musculoskeletal:        General: Normal range of motion.     Cervical back: Normal range of motion and neck supple.  Skin:    General: Skin is warm.     Capillary Refill: Capillary refill takes less than 2 seconds.  Neurological:     Mental Status: He is alert and oriented to person, place, and time.  Psychiatric:  Mood and Affect: Mood normal.        Behavior: Behavior normal.    ED Results / Procedures / Treatments   Labs (all labs ordered are listed, but only abnormal results are displayed) Labs Reviewed  COMPREHENSIVE METABOLIC PANEL - Abnormal; Notable for the following components:      Result Value   AST 44 (*)    Total Bilirubin 1.5 (*)    All other components within normal limits  URINALYSIS, ROUTINE W REFLEX MICROSCOPIC - Abnormal; Notable for the following components:   APPearance HAZY (*)    Ketones, ur 20 (*)    All other components within normal limits  RESP PANEL BY RT-PCR (FLU A&B, COVID) ARPGX2  CBC WITH DIFFERENTIAL/PLATELET  TSH  CBG MONITORING, ED  CBG MONITORING, ED  TROPONIN I (HIGH SENSITIVITY)  TROPONIN I (HIGH SENSITIVITY)    EKG EKG Interpretation  Date/Time:  Wednesday May 12 2021 13:44:20 EDT Ventricular Rate:  70 PR Interval:  156 QRS Duration: 96 QT Interval:  456 QTC Calculation: 492 R Axis:   38 Text Interpretation: Sinus rhythm with Premature supraventricular complexes and Premature ventricular complexes or Fusion complexes Septal infarct , age undetermined Abnormal ECG PVCs are new from prior Confirmed by Isla Pence [phone]) on 05/12/2021 3:00:46 PM  Radiology DG Chest 1 View  Result Date: 05/12/2021 CLINICAL DATA:  Shortness of breath for 1 hour EXAM: CHEST  1 VIEW COMPARISON:  10/29/2017 FINDINGS: Cardiac shadow is within normal limits. Calcified mediastinal lymph nodes are noted stable from the prior exam. Scattered calcified granulomas are noted in both  lungs. No focal infiltrate or sizable effusion is seen. No bony abnormality is noted. IMPRESSION: Changes of prior granulomatous disease without acute abnormality. Electronically Signed   By: Inez Catalina M.D.   On: 05/12/2021 14:31   CT HEAD WO CONTRAST (5MM)  Result Date: 05/12/2021 CLINICAL DATA:  Headache. New or worsening. sudden onset of dizziness while at lunch today, had a near syncopal episode and had to sit down in the nearest booth EXAM: CT HEAD WITHOUT CONTRAST TECHNIQUE: Contiguous axial images were obtained from the base of the skull through the vertex without intravenous contrast. COMPARISON:  None. FINDINGS: Brain: No evidence of large-territorial acute infarction. No parenchymal hemorrhage. No mass lesion. No extra-axial collection. No mass effect or midline shift. No hydrocephalus. Basilar cisterns are patent. Vascular: No hyperdense vessel. Skull: No acute fracture or focal lesion. Sinuses/Orbits: Paranasal sinuses and mastoid air cells are clear. The orbits are unremarkable. Other: None. IMPRESSION: No acute intracranial abnormality. Electronically Signed   By: Iven Finn M.D.   On: 05/12/2021 16:08    Procedures Procedures   Medications Ordered in ED Medications  sodium chloride 0.9 % bolus 1,000 mL (0 mLs Intravenous Stopped 05/12/21 1736)  LORazepam (ATIVAN) tablet 0.5 mg (0.5 mg Oral Given 05/12/21 1612)    ED Course  I have reviewed the triage vital signs and the nursing notes.  Pertinent labs & imaging results that were available during my care of the patient were reviewed by me and considered in my medical decision making (see chart for details).    MDM Rules/Calculators/A&P                           Pt is able to ambulate and is feeling better.    Pt is stable for d/c.  Return if worse.  F/u with pcp. Final Clinical Impression(s) / ED Diagnoses Final diagnoses:  Benign paroxysmal positional vertigo, unspecified laterality    Rx / DC Orders ED Discharge  Orders          Ordered    meclizine (ANTIVERT) 25 MG tablet  3 times daily PRN        05/12/21 1949    LORazepam (ATIVAN) 0.5 MG tablet  Every 8 hours PRN        05/12/21 1949             Isla Pence, MD 05/12/21 1950

## 2021-05-12 NOTE — ED Notes (Signed)
Upon standing during orthostatic vitals, pt became unsteady and stated he was dizzy. Pt was assisted back into bed.

## 2021-05-12 NOTE — Discharge Instructions (Addendum)
Take the Antivert first.  If that does not help the dizziness, then take the Ativan.  Both will cause sedation, so do not drive when taking these meds.

## 2021-05-12 NOTE — ED Notes (Signed)
Patient transported to CT 

## 2021-05-14 DIAGNOSIS — M9903 Segmental and somatic dysfunction of lumbar region: Secondary | ICD-10-CM | POA: Diagnosis not present

## 2021-05-14 DIAGNOSIS — M9905 Segmental and somatic dysfunction of pelvic region: Secondary | ICD-10-CM | POA: Diagnosis not present

## 2021-05-14 DIAGNOSIS — M9902 Segmental and somatic dysfunction of thoracic region: Secondary | ICD-10-CM | POA: Diagnosis not present

## 2021-05-14 DIAGNOSIS — M62830 Muscle spasm of back: Secondary | ICD-10-CM | POA: Diagnosis not present

## 2021-05-27 DIAGNOSIS — H6122 Impacted cerumen, left ear: Secondary | ICD-10-CM | POA: Diagnosis not present

## 2021-05-27 DIAGNOSIS — E663 Overweight: Secondary | ICD-10-CM | POA: Diagnosis not present

## 2021-05-27 DIAGNOSIS — Z6825 Body mass index (BMI) 25.0-25.9, adult: Secondary | ICD-10-CM | POA: Diagnosis not present

## 2021-05-27 DIAGNOSIS — M1991 Primary osteoarthritis, unspecified site: Secondary | ICD-10-CM | POA: Diagnosis not present

## 2021-05-27 DIAGNOSIS — Z23 Encounter for immunization: Secondary | ICD-10-CM | POA: Diagnosis not present

## 2021-06-01 DIAGNOSIS — G5603 Carpal tunnel syndrome, bilateral upper limbs: Secondary | ICD-10-CM | POA: Diagnosis not present

## 2021-06-01 DIAGNOSIS — G5601 Carpal tunnel syndrome, right upper limb: Secondary | ICD-10-CM | POA: Diagnosis not present

## 2021-06-25 DIAGNOSIS — M9905 Segmental and somatic dysfunction of pelvic region: Secondary | ICD-10-CM | POA: Diagnosis not present

## 2021-06-25 DIAGNOSIS — M9903 Segmental and somatic dysfunction of lumbar region: Secondary | ICD-10-CM | POA: Diagnosis not present

## 2021-06-25 DIAGNOSIS — M9902 Segmental and somatic dysfunction of thoracic region: Secondary | ICD-10-CM | POA: Diagnosis not present

## 2021-06-25 DIAGNOSIS — M62830 Muscle spasm of back: Secondary | ICD-10-CM | POA: Diagnosis not present

## 2021-06-28 DIAGNOSIS — M9905 Segmental and somatic dysfunction of pelvic region: Secondary | ICD-10-CM | POA: Diagnosis not present

## 2021-06-28 DIAGNOSIS — M9902 Segmental and somatic dysfunction of thoracic region: Secondary | ICD-10-CM | POA: Diagnosis not present

## 2021-06-28 DIAGNOSIS — M62830 Muscle spasm of back: Secondary | ICD-10-CM | POA: Diagnosis not present

## 2021-06-28 DIAGNOSIS — M9903 Segmental and somatic dysfunction of lumbar region: Secondary | ICD-10-CM | POA: Diagnosis not present

## 2021-06-30 DIAGNOSIS — M9905 Segmental and somatic dysfunction of pelvic region: Secondary | ICD-10-CM | POA: Diagnosis not present

## 2021-06-30 DIAGNOSIS — M9903 Segmental and somatic dysfunction of lumbar region: Secondary | ICD-10-CM | POA: Diagnosis not present

## 2021-06-30 DIAGNOSIS — M9902 Segmental and somatic dysfunction of thoracic region: Secondary | ICD-10-CM | POA: Diagnosis not present

## 2021-06-30 DIAGNOSIS — M62830 Muscle spasm of back: Secondary | ICD-10-CM | POA: Diagnosis not present

## 2021-07-02 DIAGNOSIS — M9902 Segmental and somatic dysfunction of thoracic region: Secondary | ICD-10-CM | POA: Diagnosis not present

## 2021-07-02 DIAGNOSIS — M9905 Segmental and somatic dysfunction of pelvic region: Secondary | ICD-10-CM | POA: Diagnosis not present

## 2021-07-02 DIAGNOSIS — M62830 Muscle spasm of back: Secondary | ICD-10-CM | POA: Diagnosis not present

## 2021-07-02 DIAGNOSIS — M9903 Segmental and somatic dysfunction of lumbar region: Secondary | ICD-10-CM | POA: Diagnosis not present

## 2021-07-05 DIAGNOSIS — M545 Low back pain, unspecified: Secondary | ICD-10-CM | POA: Diagnosis not present

## 2021-07-05 DIAGNOSIS — M461 Sacroiliitis, not elsewhere classified: Secondary | ICD-10-CM | POA: Diagnosis not present

## 2021-07-05 DIAGNOSIS — E663 Overweight: Secondary | ICD-10-CM | POA: Diagnosis not present

## 2021-07-05 DIAGNOSIS — Z6825 Body mass index (BMI) 25.0-25.9, adult: Secondary | ICD-10-CM | POA: Diagnosis not present

## 2021-07-07 DIAGNOSIS — M9903 Segmental and somatic dysfunction of lumbar region: Secondary | ICD-10-CM | POA: Diagnosis not present

## 2021-07-07 DIAGNOSIS — M62830 Muscle spasm of back: Secondary | ICD-10-CM | POA: Diagnosis not present

## 2021-07-07 DIAGNOSIS — M9902 Segmental and somatic dysfunction of thoracic region: Secondary | ICD-10-CM | POA: Diagnosis not present

## 2021-07-07 DIAGNOSIS — M9905 Segmental and somatic dysfunction of pelvic region: Secondary | ICD-10-CM | POA: Diagnosis not present

## 2021-07-09 DIAGNOSIS — M9905 Segmental and somatic dysfunction of pelvic region: Secondary | ICD-10-CM | POA: Diagnosis not present

## 2021-07-09 DIAGNOSIS — M62830 Muscle spasm of back: Secondary | ICD-10-CM | POA: Diagnosis not present

## 2021-07-09 DIAGNOSIS — M9902 Segmental and somatic dysfunction of thoracic region: Secondary | ICD-10-CM | POA: Diagnosis not present

## 2021-07-09 DIAGNOSIS — M9903 Segmental and somatic dysfunction of lumbar region: Secondary | ICD-10-CM | POA: Diagnosis not present

## 2021-07-12 DIAGNOSIS — M9905 Segmental and somatic dysfunction of pelvic region: Secondary | ICD-10-CM | POA: Diagnosis not present

## 2021-07-12 DIAGNOSIS — M62830 Muscle spasm of back: Secondary | ICD-10-CM | POA: Diagnosis not present

## 2021-07-12 DIAGNOSIS — M9902 Segmental and somatic dysfunction of thoracic region: Secondary | ICD-10-CM | POA: Diagnosis not present

## 2021-07-12 DIAGNOSIS — M9903 Segmental and somatic dysfunction of lumbar region: Secondary | ICD-10-CM | POA: Diagnosis not present

## 2021-07-13 ENCOUNTER — Other Ambulatory Visit (HOSPITAL_BASED_OUTPATIENT_CLINIC_OR_DEPARTMENT_OTHER): Payer: Self-pay | Admitting: Family Medicine

## 2021-07-13 ENCOUNTER — Other Ambulatory Visit (HOSPITAL_COMMUNITY): Payer: Self-pay | Admitting: Family Medicine

## 2021-07-13 DIAGNOSIS — M545 Low back pain, unspecified: Secondary | ICD-10-CM

## 2021-07-13 DIAGNOSIS — M461 Sacroiliitis, not elsewhere classified: Secondary | ICD-10-CM | POA: Diagnosis not present

## 2021-07-13 DIAGNOSIS — F418 Other specified anxiety disorders: Secondary | ICD-10-CM | POA: Diagnosis not present

## 2021-07-13 DIAGNOSIS — E559 Vitamin D deficiency, unspecified: Secondary | ICD-10-CM | POA: Diagnosis not present

## 2021-07-13 DIAGNOSIS — E782 Mixed hyperlipidemia: Secondary | ICD-10-CM | POA: Diagnosis not present

## 2021-07-13 DIAGNOSIS — F320 Major depressive disorder, single episode, mild: Secondary | ICD-10-CM | POA: Diagnosis not present

## 2021-07-13 DIAGNOSIS — Z6825 Body mass index (BMI) 25.0-25.9, adult: Secondary | ICD-10-CM | POA: Diagnosis not present

## 2021-07-13 DIAGNOSIS — G4730 Sleep apnea, unspecified: Secondary | ICD-10-CM | POA: Diagnosis not present

## 2021-07-13 DIAGNOSIS — M13811 Other specified arthritis, right shoulder: Secondary | ICD-10-CM | POA: Diagnosis not present

## 2021-07-27 ENCOUNTER — Ambulatory Visit (HOSPITAL_COMMUNITY): Payer: Medicare PPO

## 2021-07-30 DIAGNOSIS — G5601 Carpal tunnel syndrome, right upper limb: Secondary | ICD-10-CM | POA: Diagnosis not present

## 2021-07-30 DIAGNOSIS — G5603 Carpal tunnel syndrome, bilateral upper limbs: Secondary | ICD-10-CM | POA: Diagnosis not present

## 2021-08-03 DIAGNOSIS — R293 Abnormal posture: Secondary | ICD-10-CM | POA: Diagnosis not present

## 2021-08-03 DIAGNOSIS — R531 Weakness: Secondary | ICD-10-CM | POA: Diagnosis not present

## 2021-08-03 DIAGNOSIS — M545 Low back pain, unspecified: Secondary | ICD-10-CM | POA: Diagnosis not present

## 2021-08-03 DIAGNOSIS — M2560 Stiffness of unspecified joint, not elsewhere classified: Secondary | ICD-10-CM | POA: Diagnosis not present

## 2021-08-06 ENCOUNTER — Encounter (HOSPITAL_COMMUNITY): Payer: Self-pay

## 2021-08-06 ENCOUNTER — Ambulatory Visit (HOSPITAL_COMMUNITY): Payer: Medicare PPO

## 2021-08-10 DIAGNOSIS — M545 Low back pain, unspecified: Secondary | ICD-10-CM | POA: Diagnosis not present

## 2021-08-10 DIAGNOSIS — M2560 Stiffness of unspecified joint, not elsewhere classified: Secondary | ICD-10-CM | POA: Diagnosis not present

## 2021-08-10 DIAGNOSIS — R293 Abnormal posture: Secondary | ICD-10-CM | POA: Diagnosis not present

## 2021-08-10 DIAGNOSIS — R531 Weakness: Secondary | ICD-10-CM | POA: Diagnosis not present

## 2021-08-12 DIAGNOSIS — M2560 Stiffness of unspecified joint, not elsewhere classified: Secondary | ICD-10-CM | POA: Diagnosis not present

## 2021-08-12 DIAGNOSIS — R293 Abnormal posture: Secondary | ICD-10-CM | POA: Diagnosis not present

## 2021-08-12 DIAGNOSIS — R531 Weakness: Secondary | ICD-10-CM | POA: Diagnosis not present

## 2021-08-12 DIAGNOSIS — M545 Low back pain, unspecified: Secondary | ICD-10-CM | POA: Diagnosis not present

## 2021-08-13 DIAGNOSIS — G5603 Carpal tunnel syndrome, bilateral upper limbs: Secondary | ICD-10-CM | POA: Diagnosis not present

## 2021-08-13 DIAGNOSIS — G5601 Carpal tunnel syndrome, right upper limb: Secondary | ICD-10-CM | POA: Diagnosis not present

## 2021-08-17 DIAGNOSIS — M545 Low back pain, unspecified: Secondary | ICD-10-CM | POA: Diagnosis not present

## 2021-08-17 DIAGNOSIS — R293 Abnormal posture: Secondary | ICD-10-CM | POA: Diagnosis not present

## 2021-08-17 DIAGNOSIS — R531 Weakness: Secondary | ICD-10-CM | POA: Diagnosis not present

## 2021-08-17 DIAGNOSIS — M2560 Stiffness of unspecified joint, not elsewhere classified: Secondary | ICD-10-CM | POA: Diagnosis not present

## 2021-08-24 DIAGNOSIS — M545 Low back pain, unspecified: Secondary | ICD-10-CM | POA: Diagnosis not present

## 2021-08-24 DIAGNOSIS — M25562 Pain in left knee: Secondary | ICD-10-CM | POA: Diagnosis not present

## 2021-08-24 DIAGNOSIS — R531 Weakness: Secondary | ICD-10-CM | POA: Diagnosis not present

## 2021-08-24 DIAGNOSIS — R293 Abnormal posture: Secondary | ICD-10-CM | POA: Diagnosis not present

## 2021-08-24 DIAGNOSIS — M2560 Stiffness of unspecified joint, not elsewhere classified: Secondary | ICD-10-CM | POA: Diagnosis not present

## 2021-08-30 DIAGNOSIS — R531 Weakness: Secondary | ICD-10-CM | POA: Diagnosis not present

## 2021-08-30 DIAGNOSIS — M2560 Stiffness of unspecified joint, not elsewhere classified: Secondary | ICD-10-CM | POA: Diagnosis not present

## 2021-08-30 DIAGNOSIS — M25562 Pain in left knee: Secondary | ICD-10-CM | POA: Diagnosis not present

## 2021-08-30 DIAGNOSIS — M545 Low back pain, unspecified: Secondary | ICD-10-CM | POA: Diagnosis not present

## 2021-08-30 DIAGNOSIS — R293 Abnormal posture: Secondary | ICD-10-CM | POA: Diagnosis not present

## 2021-09-21 DIAGNOSIS — R972 Elevated prostate specific antigen [PSA]: Secondary | ICD-10-CM | POA: Diagnosis not present

## 2021-09-22 DIAGNOSIS — M25562 Pain in left knee: Secondary | ICD-10-CM | POA: Diagnosis not present

## 2021-09-22 DIAGNOSIS — M170 Bilateral primary osteoarthritis of knee: Secondary | ICD-10-CM | POA: Diagnosis not present

## 2021-09-22 DIAGNOSIS — M25561 Pain in right knee: Secondary | ICD-10-CM | POA: Diagnosis not present

## 2021-09-29 DIAGNOSIS — L218 Other seborrheic dermatitis: Secondary | ICD-10-CM | POA: Diagnosis not present

## 2021-09-29 DIAGNOSIS — X32XXXD Exposure to sunlight, subsequent encounter: Secondary | ICD-10-CM | POA: Diagnosis not present

## 2021-09-29 DIAGNOSIS — Z1283 Encounter for screening for malignant neoplasm of skin: Secondary | ICD-10-CM | POA: Diagnosis not present

## 2021-09-29 DIAGNOSIS — D044 Carcinoma in situ of skin of scalp and neck: Secondary | ICD-10-CM | POA: Diagnosis not present

## 2021-09-29 DIAGNOSIS — L570 Actinic keratosis: Secondary | ICD-10-CM | POA: Diagnosis not present

## 2021-09-29 DIAGNOSIS — D225 Melanocytic nevi of trunk: Secondary | ICD-10-CM | POA: Diagnosis not present

## 2021-10-02 IMAGING — CT CT HEAD W/O CM
4 series · 16 of 47 positions shown, 18 images · non-contrast
Comparison: None.

CLINICAL DATA: Headache. New or worsening. sudden onset of
dizziness while at lunch today, had a near syncopal episode and had
to sit down in the nearest booth

EXAM:
CT HEAD WITHOUT CONTRAST
TECHNIQUE: Contiguous axial images were obtained from the base of the skull
through the vertex without intravenous contrast.

[Series 2: head wo · axial · 0.41mm/px · z∈[+1280,+1396]mm · 7 of 31 slices shown, 9 images]
[im 4/31  brain]
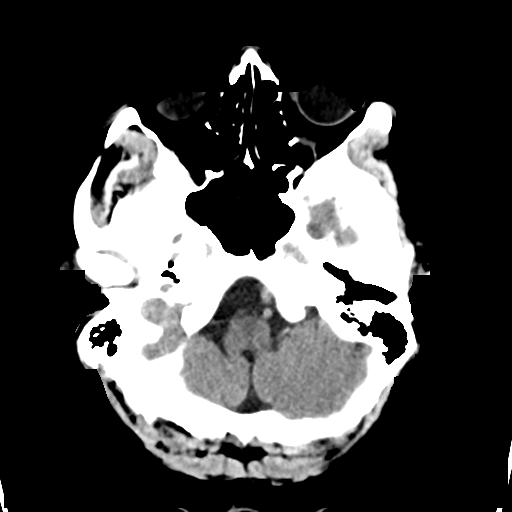
[im 4/31  bone]
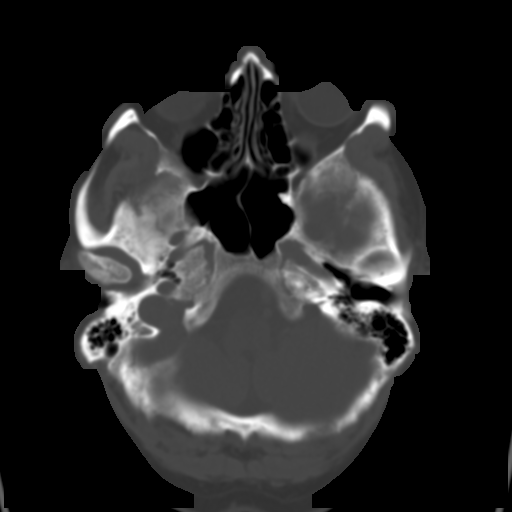
[im 8/31  brain]
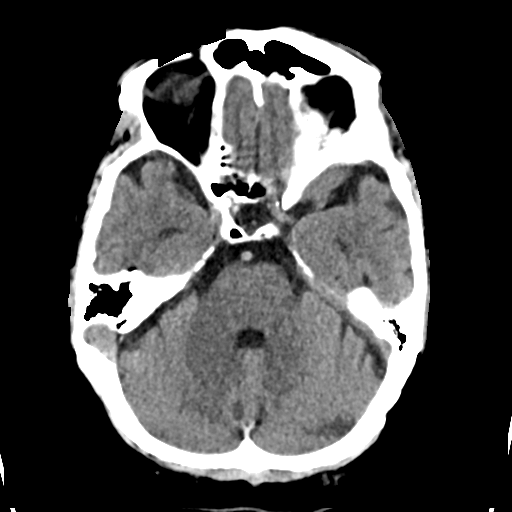
[im 12/31  brain]
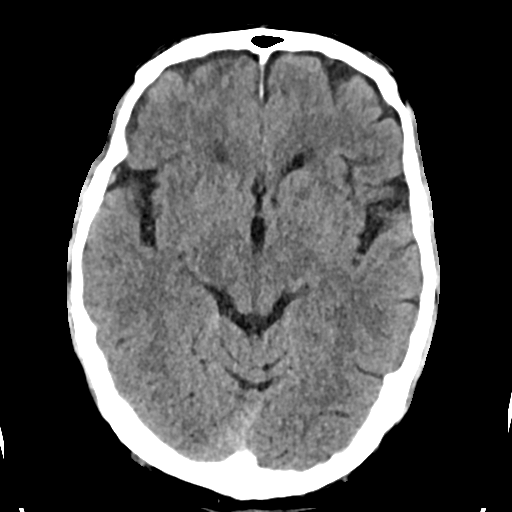
[im 16/31  brain]
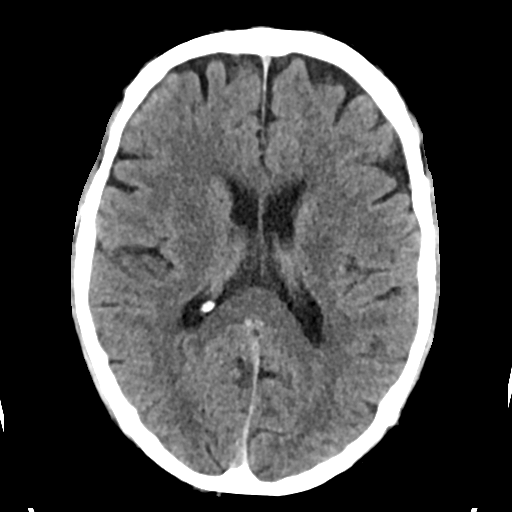
[im 19/31  brain]
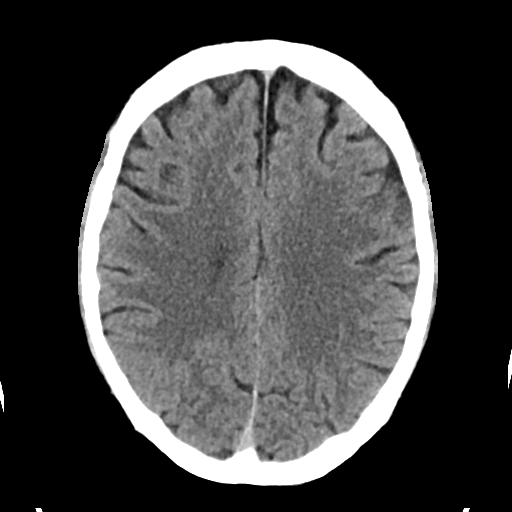
[im 19/31  bone]
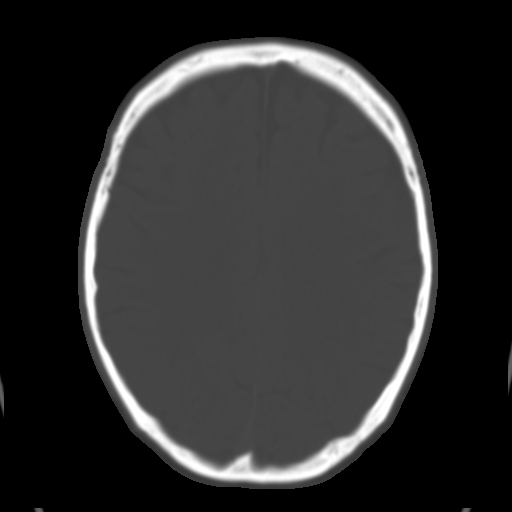
[im 23/31  brain]
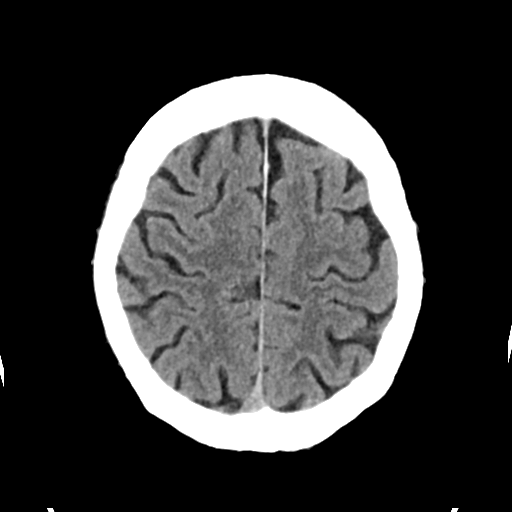
[im 27/31  brain]
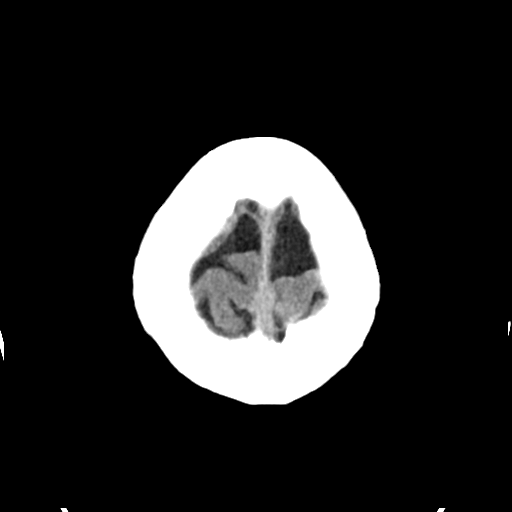

[Series 3: head bone · axial · 0.41mm/px · z∈[+1280,+1312]mm · 3 of 78 slices shown]
[im 8/78  bone]
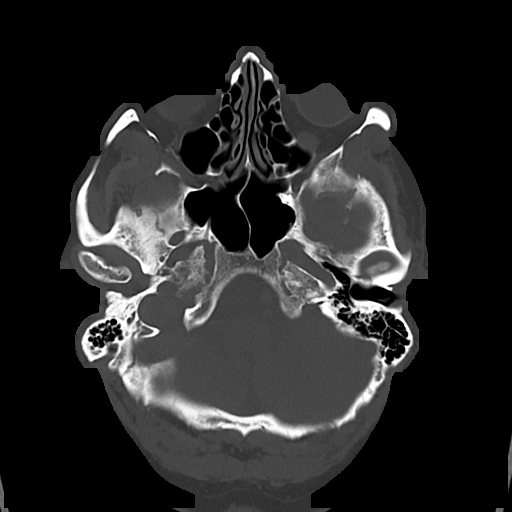
[im 16/78  bone]
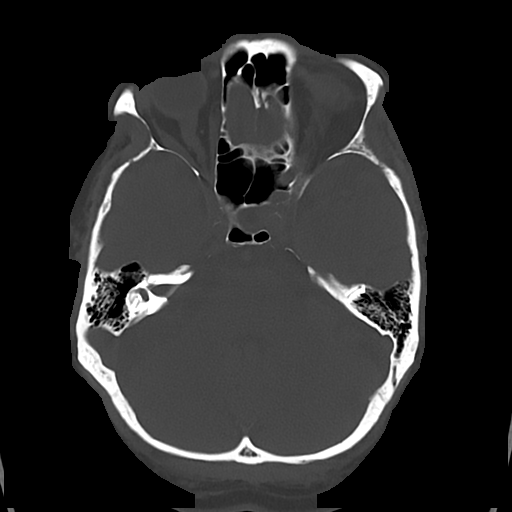
[im 24/78  bone]
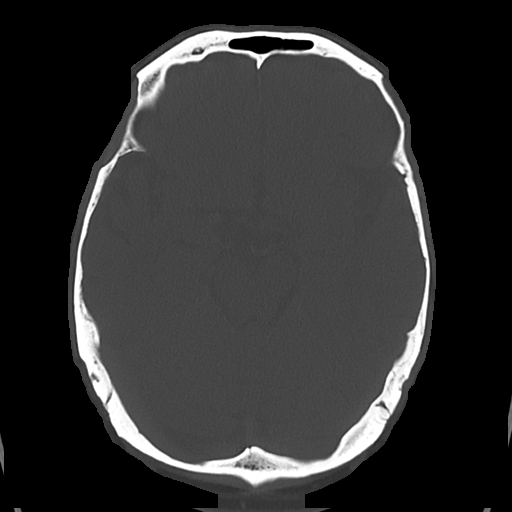

[Series 4: cor soft · coronal · 0.30mm/px · 3 of 68 slices shown]
[im 23/68  brain]
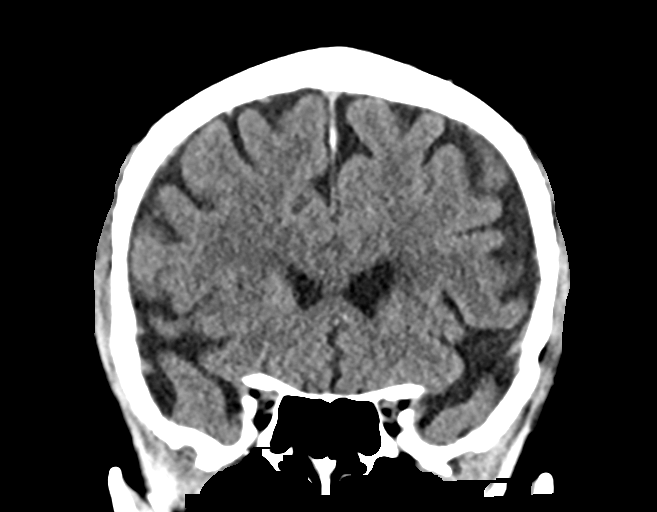
[im 30/68  brain]
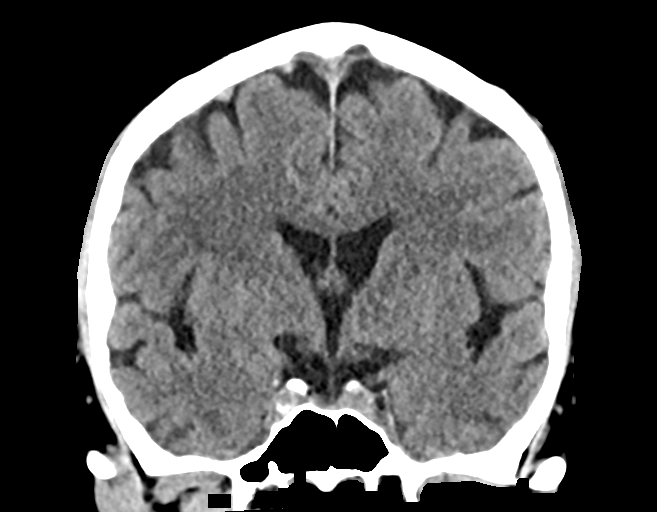
[im 38/68  brain]
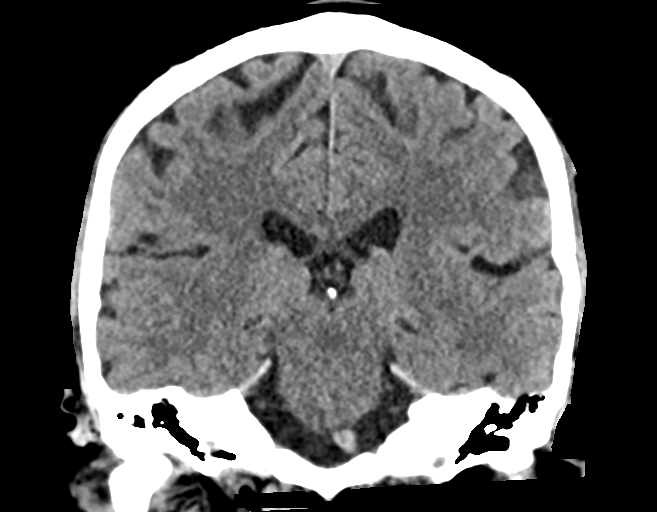

[Series 5: sag soft · sagittal · 0.30mm/px · 3 of 66 slices shown]
[im 22/66  brain]
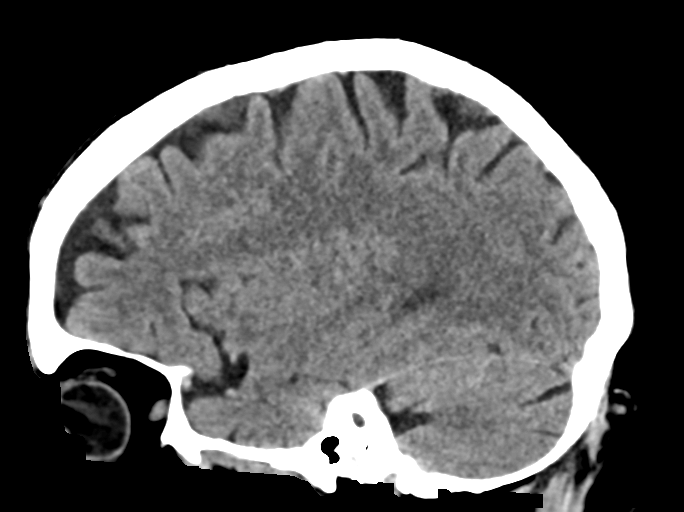
[im 33/66  brain]
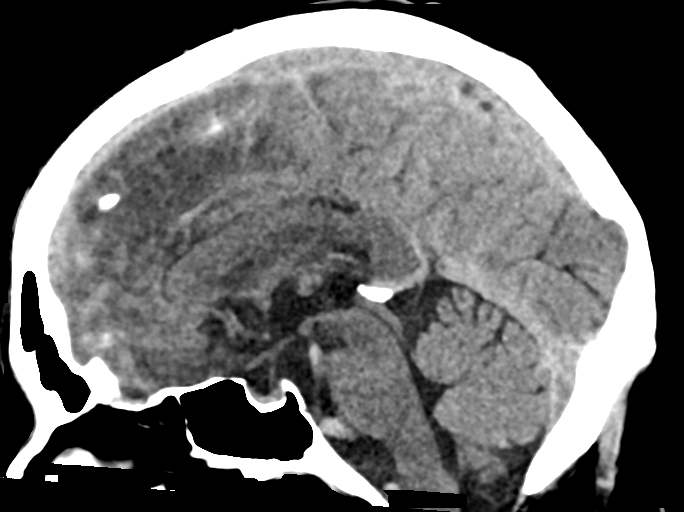
[im 44/66  brain]
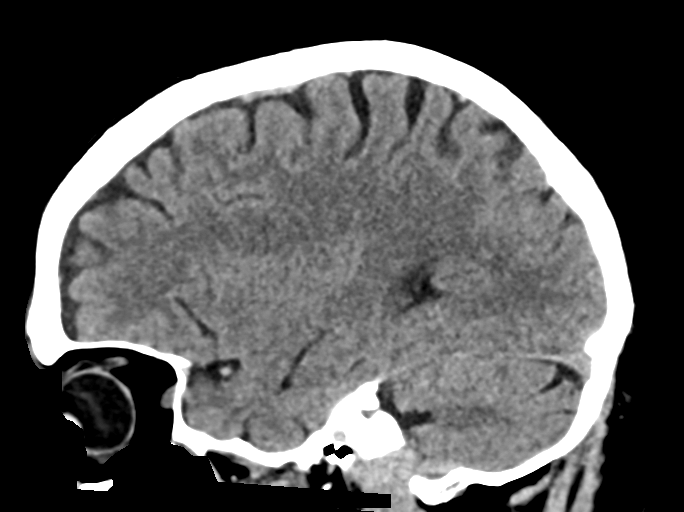

[16 of 47 positions shown; findings below may reference images not displayed]

FINDINGS: Brain:

No evidence of large-territorial acute infarction. No parenchymal
hemorrhage. No mass lesion. No extra-axial collection.

No mass effect or midline shift. No hydrocephalus. Basilar cisterns
are patent.

Vascular: No hyperdense vessel.

Skull: No acute fracture or focal lesion.

Sinuses/Orbits: Paranasal sinuses and mastoid air cells are clear.
The orbits are unremarkable.

Other: None.
IMPRESSION: No acute intracranial abnormality.

## 2021-10-02 IMAGING — CR DG CHEST 1V
2 series · 2 of 2 positions shown · non-contrast
Comparison: 10/29/2017

CLINICAL DATA: Shortness of breath for 1 hour

EXAM:
CHEST  1 VIEW

[chest ap (1 of 2)]
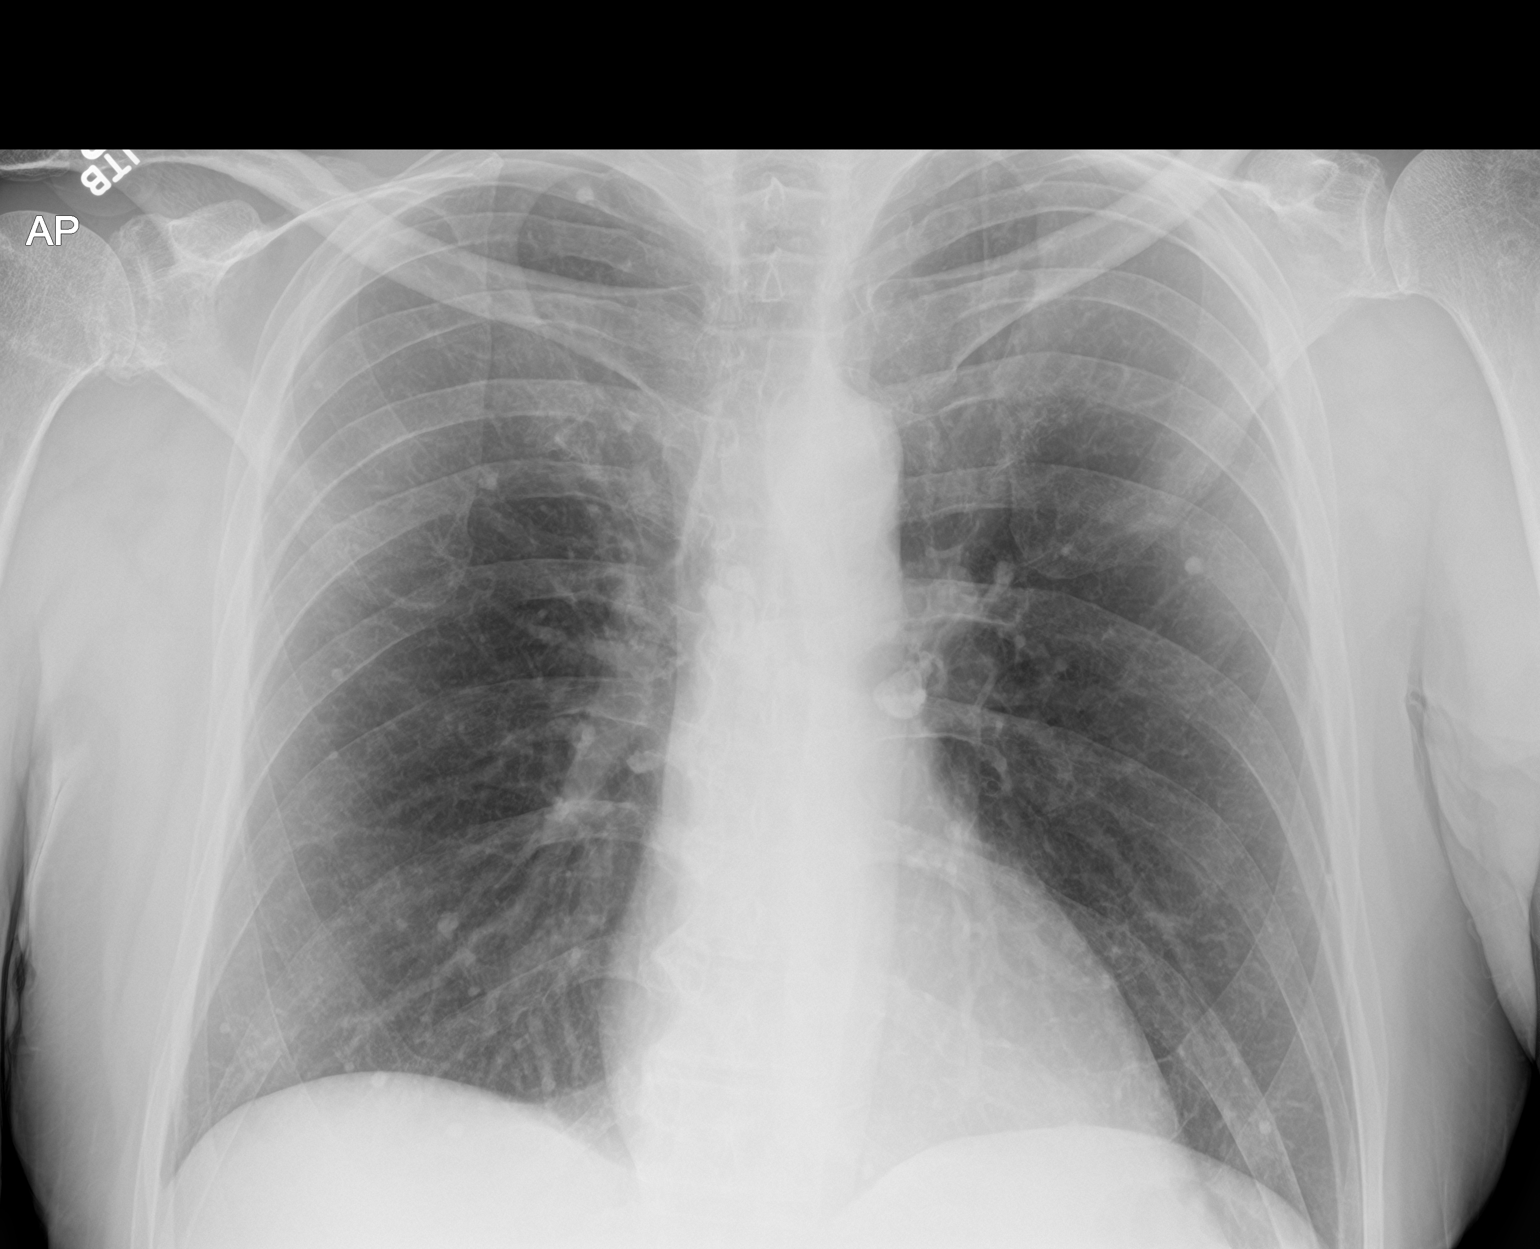

[chest ap (2 of 2)]
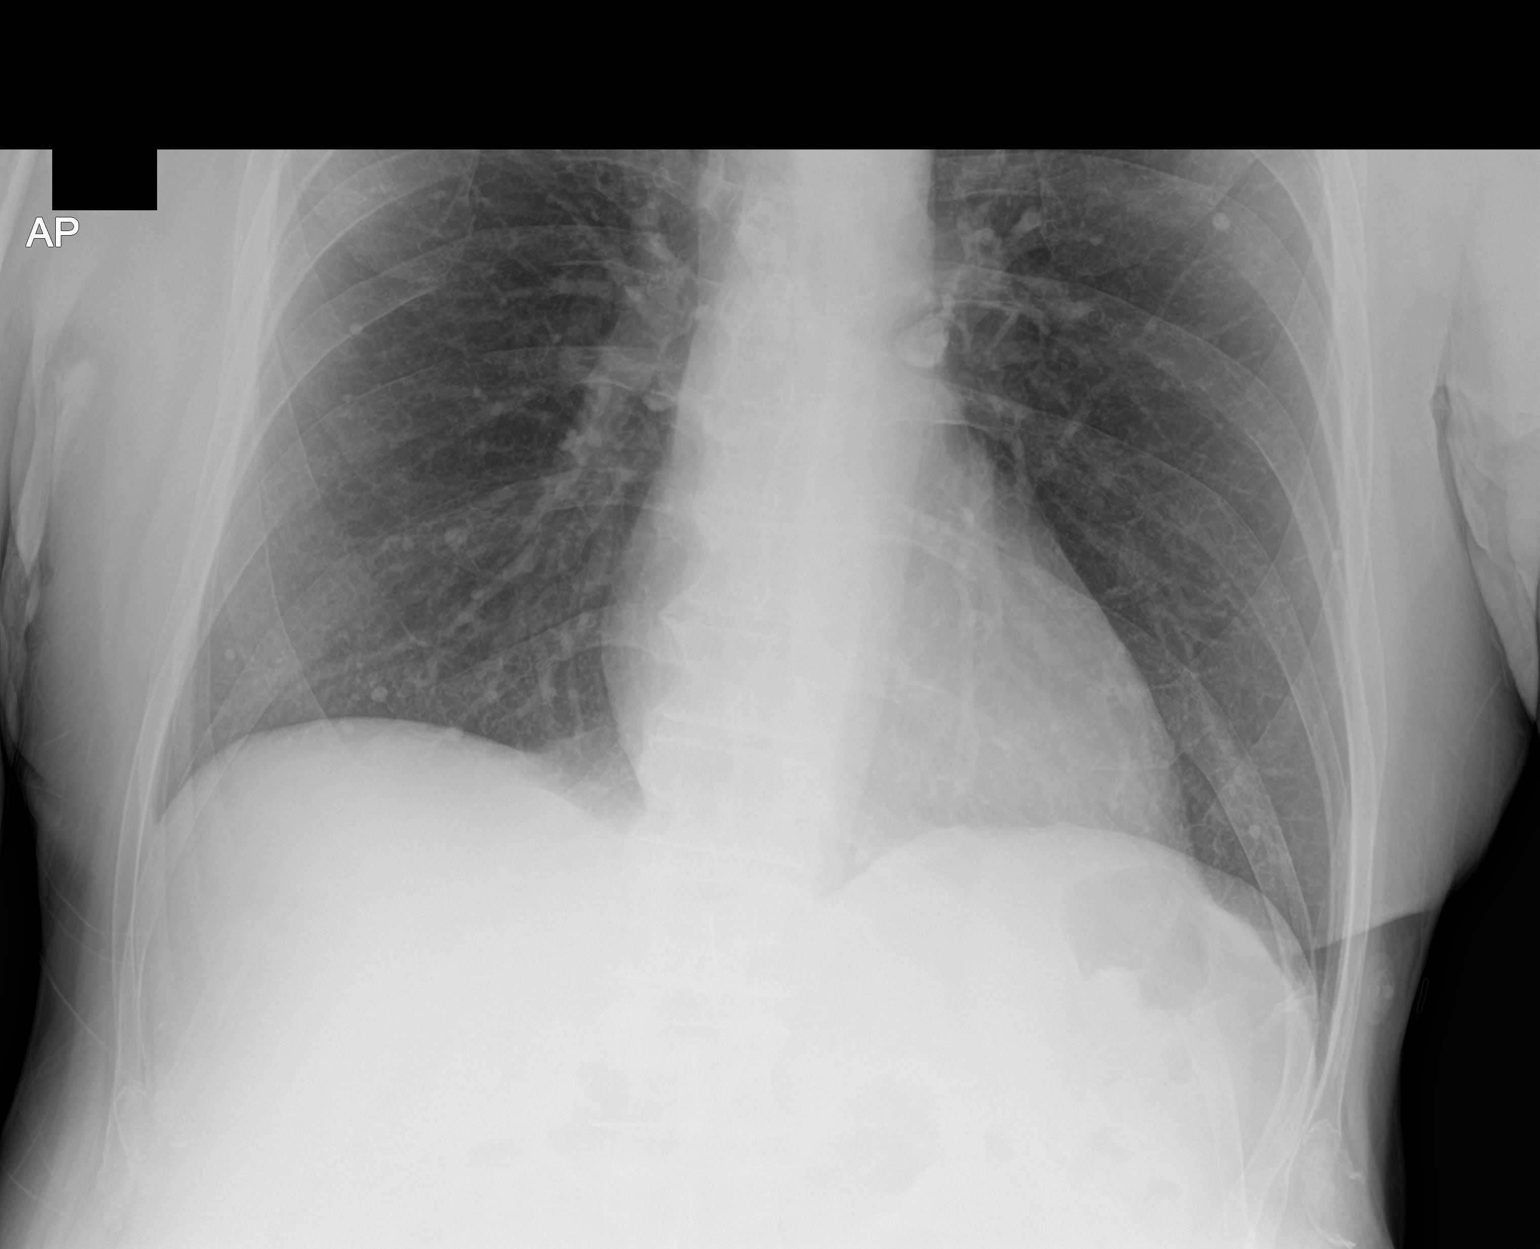

[2 of 2 positions shown; findings below may reference images not displayed]

FINDINGS: Cardiac shadow is within normal limits. Calcified mediastinal lymph
nodes are noted stable from the prior exam. Scattered calcified
granulomas are noted in both lungs. No focal infiltrate or sizable
effusion is seen. No bony abnormality is noted.
IMPRESSION: Changes of prior granulomatous disease without acute abnormality.

## 2021-10-02 IMAGING — MR MR HEAD W/O CM
12 of 13 series · 44 of 48 positions shown · non-contrast
Comparison: None.

CLINICAL DATA: Near syncopal event.

EXAM:
MRI HEAD WITHOUT CONTRAST
TECHNIQUE: Multiplanar, multiecho pulse sequences of the brain and surrounding
structures were obtained without intravenous contrast.

[Series 5: DWI · axial · 3.0mm · 0.88mm/px · z∈[-47,+105]mm · 7 of 104 slices shown (1 of 4)]
[im 1/104]
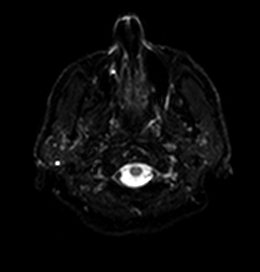
[im 18/104]
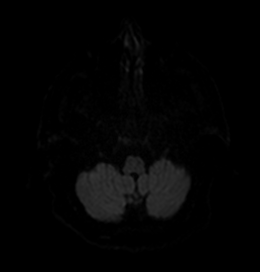
[im 35/104]
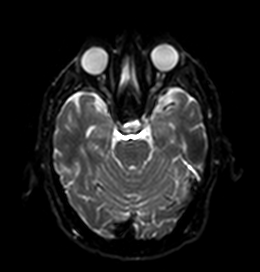
[im 52/104]
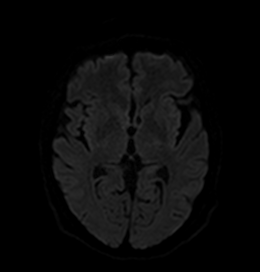
[im 69/104]
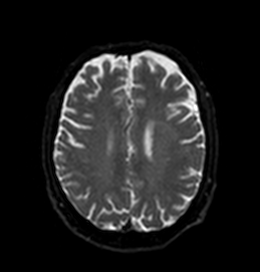
[im 86/104]
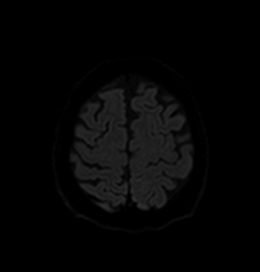
[im 104/104]
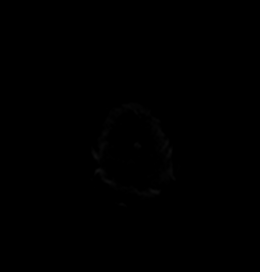

[Series 6: DWI · axial · 3.0mm · 0.88mm/px · z∈[-47,+105]mm · 4 of 52 slices shown (2 of 4)]
[im 1/52]
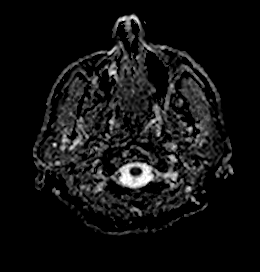
[im 18/52]
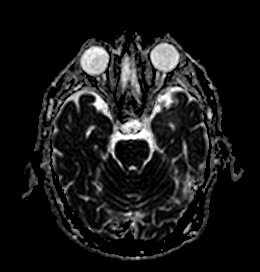
[im 35/52]
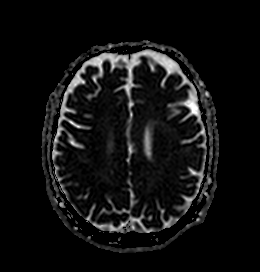
[im 52/52]
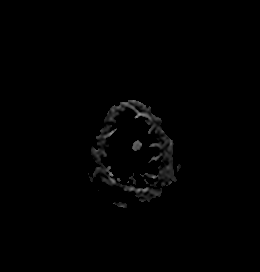

[Series 7: DWI · coronal · 4.0mm · 0.88mm/px · 6 of 76 slices shown (3 of 4)]
[im 1/76]
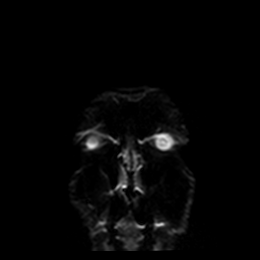
[im 16/76]
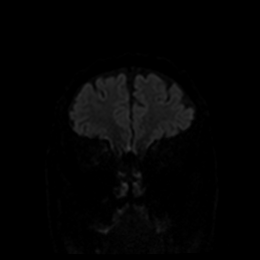
[im 31/76]
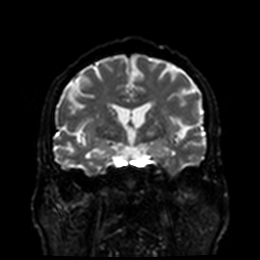
[im 46/76]
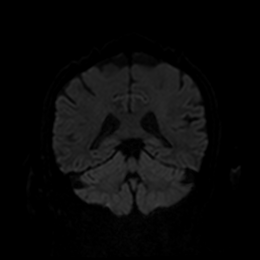
[im 61/76]
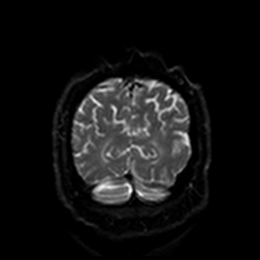
[im 76/76]
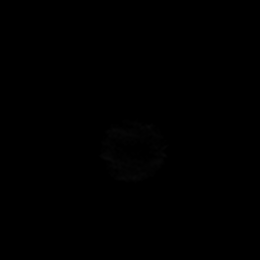

[Series 8: DWI · coronal · 4.0mm · 0.88mm/px · 3 of 38 slices shown (4 of 4)]
[im 1/38]
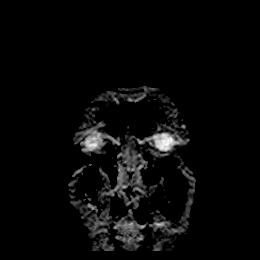
[im 19/38]
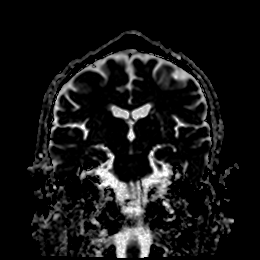
[im 38/38]
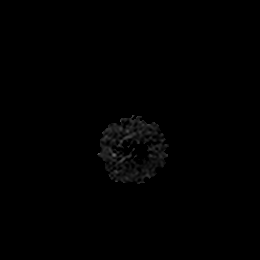

[Series 9: FLAIR · axial · 5.0mm · 0.45mm/px · z∈[-45,+105]mm · 2 of 26 slices shown]
[im 1/26]
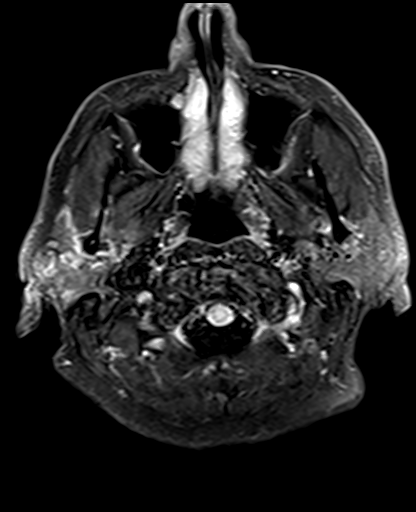
[im 26/26]
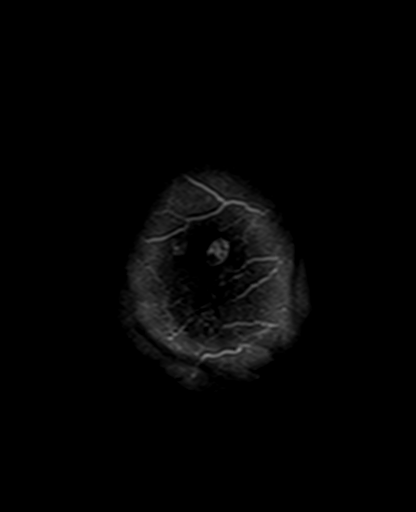

[Series 10: mag_images · axial · 3.0mm · 0.90mm/px · z∈[-52,+112]mm · 4 of 56 slices shown]
[im 1/56]
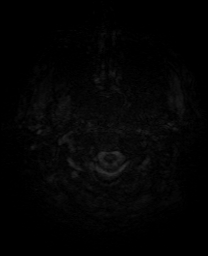
[im 19/56]
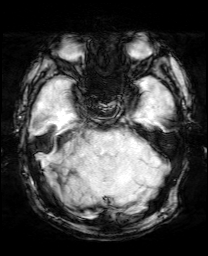
[im 37/56]
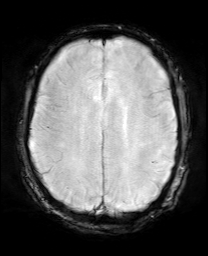
[im 56/56]
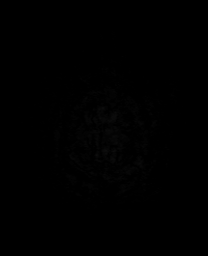

[Series 11: pha_images · axial · 3.0mm · 0.90mm/px · z∈[-52,+112]mm · 4 of 55 slices shown]
[im 1/55]
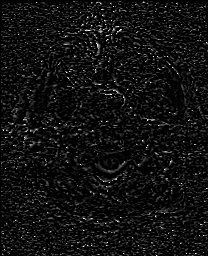
[im 19/55]
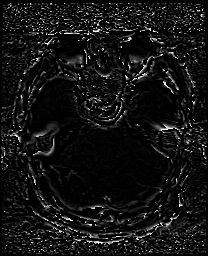
[im 37/55]
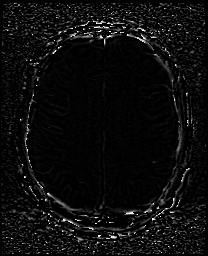
[im 55/55]
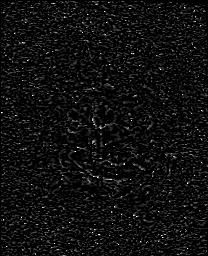

[Series 12: swi_images · axial · 3.0mm · 0.90mm/px · z∈[-52,+112]mm · 4 of 56 slices shown]
[im 1/56]
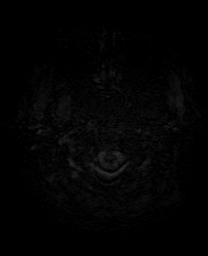
[im 19/56]
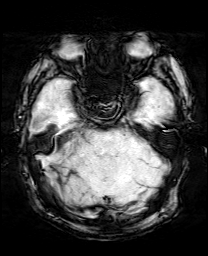
[im 37/56]
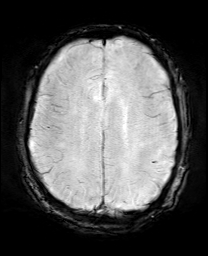
[im 56/56]
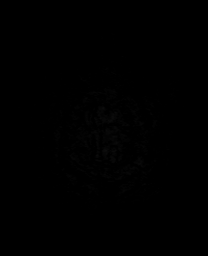

[Series 13: mip_images(sw) · axial · 24.0mm · 0.90mm/px · z∈[-42,+102]mm · 4 of 49 slices shown]
[im 1/49]
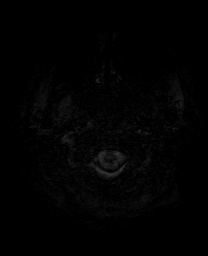
[im 17/49]
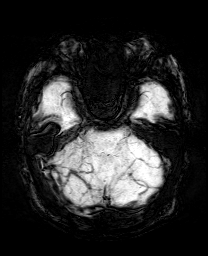
[im 33/49]
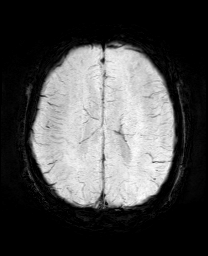
[im 49/49]
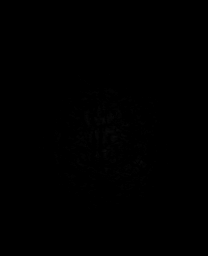

[Series 14: T1 · sagittal · 5.0mm · 0.75mm/px · 2 of 25 slices shown]
[im 1/25]
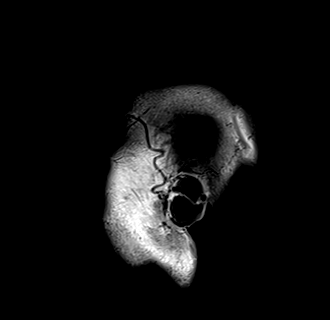
[im 25/25]
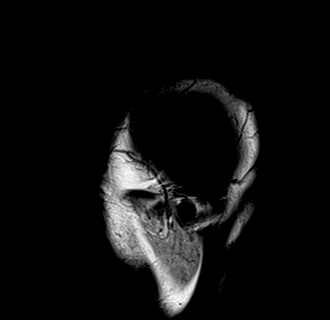

[Series 15: T2 · axial · 5.0mm · 0.72mm/px · z∈[-43,+107]mm · 2 of 26 slices shown (1 of 2)]
[im 1/26]
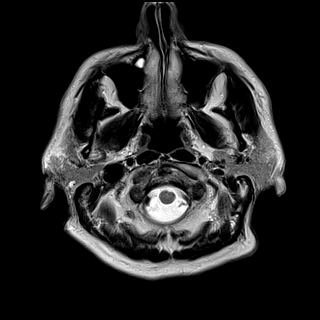
[im 26/26]
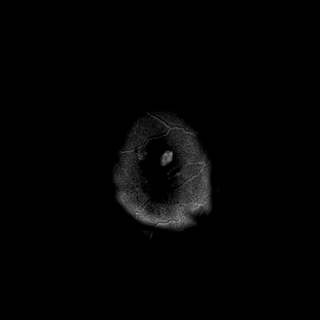

[Series 17: T2 · coronal · 5.0mm · 0.34mm/px · 2 of 31 slices shown (2 of 2)]
[im 1/31]
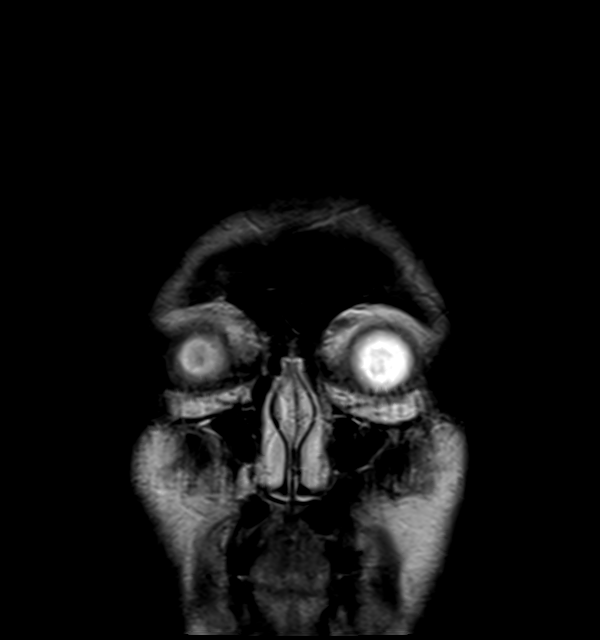
[im 31/31]
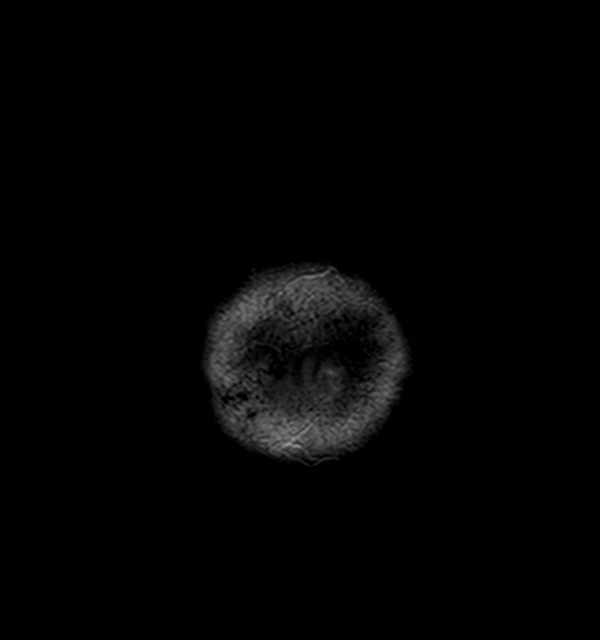

[44 of 48 positions shown; findings below may reference images not displayed]

FINDINGS: Brain: No acute infarct, mass effect or extra-axial collection. No
acute or chronic hemorrhage. There is multifocal hyperintense
T2-weighted signal within the white matter. Generalized volume loss
without a clear lobar predilection. The midline structures are
normal.

Vascular: Major flow voids are preserved.

Skull and upper cervical spine: Normal calvarium and skull base.
Visualized upper cervical spine and soft tissues are normal.

Sinuses/Orbits:No paranasal sinus fluid levels or advanced mucosal
thickening. No mastoid or middle ear effusion. Normal orbits.
IMPRESSION: 1. No acute intracranial abnormality.
2. Generalized volume loss and findings of chronic small vessel
ischemia.

## 2021-10-27 DIAGNOSIS — I10 Essential (primary) hypertension: Secondary | ICD-10-CM | POA: Diagnosis not present

## 2021-10-27 DIAGNOSIS — Z6825 Body mass index (BMI) 25.0-25.9, adult: Secondary | ICD-10-CM | POA: Diagnosis not present

## 2021-10-27 DIAGNOSIS — Z23 Encounter for immunization: Secondary | ICD-10-CM | POA: Diagnosis not present

## 2021-10-27 DIAGNOSIS — H6123 Impacted cerumen, bilateral: Secondary | ICD-10-CM | POA: Diagnosis not present

## 2021-10-27 DIAGNOSIS — E663 Overweight: Secondary | ICD-10-CM | POA: Diagnosis not present

## 2021-10-28 DIAGNOSIS — M170 Bilateral primary osteoarthritis of knee: Secondary | ICD-10-CM | POA: Diagnosis not present

## 2021-11-03 DIAGNOSIS — M170 Bilateral primary osteoarthritis of knee: Secondary | ICD-10-CM | POA: Diagnosis not present

## 2021-11-11 DIAGNOSIS — M170 Bilateral primary osteoarthritis of knee: Secondary | ICD-10-CM | POA: Diagnosis not present

## 2021-11-15 DIAGNOSIS — I959 Hypotension, unspecified: Secondary | ICD-10-CM | POA: Diagnosis not present

## 2021-11-15 DIAGNOSIS — Z6825 Body mass index (BMI) 25.0-25.9, adult: Secondary | ICD-10-CM | POA: Diagnosis not present

## 2021-11-18 DIAGNOSIS — F4320 Adjustment disorder, unspecified: Secondary | ICD-10-CM | POA: Diagnosis not present

## 2021-12-20 DIAGNOSIS — M461 Sacroiliitis, not elsewhere classified: Secondary | ICD-10-CM | POA: Diagnosis not present

## 2021-12-20 DIAGNOSIS — S335XXA Sprain of ligaments of lumbar spine, initial encounter: Secondary | ICD-10-CM | POA: Diagnosis not present

## 2021-12-20 DIAGNOSIS — Z6825 Body mass index (BMI) 25.0-25.9, adult: Secondary | ICD-10-CM | POA: Diagnosis not present

## 2021-12-20 DIAGNOSIS — M545 Low back pain, unspecified: Secondary | ICD-10-CM | POA: Diagnosis not present

## 2022-01-03 DIAGNOSIS — N400 Enlarged prostate without lower urinary tract symptoms: Secondary | ICD-10-CM | POA: Diagnosis not present

## 2022-01-03 DIAGNOSIS — R109 Unspecified abdominal pain: Secondary | ICD-10-CM | POA: Diagnosis not present

## 2022-01-03 DIAGNOSIS — S335XXA Sprain of ligaments of lumbar spine, initial encounter: Secondary | ICD-10-CM | POA: Diagnosis not present

## 2022-01-03 DIAGNOSIS — K7689 Other specified diseases of liver: Secondary | ICD-10-CM | POA: Diagnosis not present

## 2022-01-03 DIAGNOSIS — M1991 Primary osteoarthritis, unspecified site: Secondary | ICD-10-CM | POA: Diagnosis not present

## 2022-01-03 DIAGNOSIS — Z6825 Body mass index (BMI) 25.0-25.9, adult: Secondary | ICD-10-CM | POA: Diagnosis not present

## 2022-01-03 DIAGNOSIS — R911 Solitary pulmonary nodule: Secondary | ICD-10-CM | POA: Diagnosis not present

## 2022-01-14 DIAGNOSIS — M9905 Segmental and somatic dysfunction of pelvic region: Secondary | ICD-10-CM | POA: Diagnosis not present

## 2022-01-14 DIAGNOSIS — M62830 Muscle spasm of back: Secondary | ICD-10-CM | POA: Diagnosis not present

## 2022-01-14 DIAGNOSIS — M9903 Segmental and somatic dysfunction of lumbar region: Secondary | ICD-10-CM | POA: Diagnosis not present

## 2022-01-14 DIAGNOSIS — M9902 Segmental and somatic dysfunction of thoracic region: Secondary | ICD-10-CM | POA: Diagnosis not present

## 2022-01-17 DIAGNOSIS — M9903 Segmental and somatic dysfunction of lumbar region: Secondary | ICD-10-CM | POA: Diagnosis not present

## 2022-01-17 DIAGNOSIS — M9902 Segmental and somatic dysfunction of thoracic region: Secondary | ICD-10-CM | POA: Diagnosis not present

## 2022-01-17 DIAGNOSIS — M9905 Segmental and somatic dysfunction of pelvic region: Secondary | ICD-10-CM | POA: Diagnosis not present

## 2022-01-17 DIAGNOSIS — M62830 Muscle spasm of back: Secondary | ICD-10-CM | POA: Diagnosis not present

## 2022-01-20 ENCOUNTER — Encounter: Payer: Self-pay | Admitting: Pulmonary Disease

## 2022-01-20 ENCOUNTER — Ambulatory Visit: Payer: Medicare PPO | Admitting: Pulmonary Disease

## 2022-01-20 VITALS — BP 126/88 | HR 82 | Temp 97.9°F | Ht 77.0 in | Wt 205.6 lb

## 2022-01-20 DIAGNOSIS — R911 Solitary pulmonary nodule: Secondary | ICD-10-CM

## 2022-01-20 DIAGNOSIS — Z789 Other specified health status: Secondary | ICD-10-CM | POA: Diagnosis not present

## 2022-01-20 NOTE — Progress Notes (Signed)
? ?Synopsis: Referred in May 2023 for lung nodule by Sharilyn Sites, MD ? ?Subjective:  ? ?PATIENT ID: Micheal Baker GENDER: male DOB: 07/18/1946, MRN: [id number] ? ?Chief Complaint  ?Patient presents with  ? Consult  ?  Patient is here to talk about spot on his lung.   ? ? ?This is a 76 year old gentleman past medical history of nonsustained VT, hypothyroidism, hypertension, PVCs.  Non-smoker his entire life.  He tried a few cigarettes when he was a teenager.  Never had any history of malignancy or solid organ tumor.  No family history of lung cancer.  Patient had a CT scan of the abdomen and pelvis completed 4 evaluation of possible kidney stone.  In the left lower lobe base of the lung was found to have a 5 mm peripheral lung nodule. ? ? ?Past Medical History:  ?Diagnosis Date  ? Acute medial meniscus tear of left knee   ? Depression   ? Frequent PVCs   ? Hypertension   ? Hypothyroidism   ? NSVT (nonsustained ventricular tachycardia) ([place])   ? Osteoarthritis   ? Thyroid disease   ?  ? ?Family History  ?Problem Relation Age of Onset  ? Hypertension Mother   ? Hypertension Father   ? Heart attack Father   ? Diabetes Father   ? Asthma Daughter   ?  ? ?Past Surgical History:  ?Procedure Laterality Date  ? CATARACT EXTRACTION    ? left  ? COLONOSCOPY N/A 04/08/2016  ? Procedure: COLONOSCOPY;  Surgeon: Danie Binder, MD;  [facility]: [facility];  Service: Endoscopy;  Laterality: N/A;  11:15 AM  ? EYE SURGERY    ? left-scar tissue  ? groin surgery  2010  ? growth removed   ? KNEE ARTHROSCOPY    ? left  ? KNEE ARTHROSCOPY WITH LATERAL MENISECTOMY Right 03/11/2015  ? Procedure: RIGHT KNEE ARTHROSCOPY WITH MEDIALAND LATERAL MENISECTOMY;  Surgeon: Elsie Saas, MD;  Location: [facility];  Service: Orthopedics;  Laterality: Right;  ? KNEE ARTHROSCOPY WITH MEDIAL MENISECTOMY Left 06/27/2014  ? Procedure: LEFT KNEE ARTHROSCOPY WITH PARTIAL MEDIAL AND LATERAL MENISECTOMIES AND CHONDROPLASTY;  Surgeon: Lorn Junes, MD;  Location: [facility];  Service: Orthopedics;  Laterality: Left;  ? KNEE ARTHROSCOPY WITH MEDIAL MENISECTOMY Right 03/11/2015  ? Procedure: KNEE ARTHROSCOPY WITH MEDIAL MENISECTOMY;  Surgeon: Elsie Saas, MD;  Location: [facility];  Service: Orthopedics;  Laterality: Right;  ? LEFT HEART CATHETERIZATION WITH CORONARY ANGIOGRAM N/A 09/14/2011  ? Procedure: LEFT HEART CATHETERIZATION WITH CORONARY ANGIOGRAM;  Surgeon: Pixie Casino, MD;  Location: [facility];  Service: Cardiovascular;  Laterality: N/A;  ? POLYPECTOMY  04/08/2016  ? Procedure: POLYPECTOMY;  Surgeon: Danie Binder, MD;  Location: [facility];  Service: Endoscopy;;  colon ?  ? RETINAL DETACHMENT SURGERY Left 2002  ? Dr. Merlene Morse  ? ? ?Social History  ? ?Socioeconomic History  ? Marital status: Divorced  ?  Spouse name: Not on file  ? Number of children: 2  ? Years of education: Masters  ? Highest education level: Not on file  ?Occupational History  ? 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  Employer: RETIRED  ? Occupation: Retired  ?Tobacco Use  ? Smoking status: Never  ? Smokeless tobacco: Never  ? Tobacco comments:  ?  06/10/14 1968- Tried it once and didn't like it- AJ  ?Vaping Use  ? Vaping Use: Never used  ?Substance and Sexual  Activity  ? Alcohol use: Yes  ?  Alcohol/week: 0.0 standard drinks  ?  Comment: occasionally  ? Drug use: No  ? Sexual activity: Not on file  ?Other Topics Concern  ? Not on file  ?Social History Narrative  ? Patient drinks 2 caffienated drinks a day.  Lives alone in a one story home.  Has 2 children.  Retired from [employer].  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  Education: Oceanographer.    ? ?Social Determinants of Health  ? ?Financial Resource Strain: Not on file  ?Food Insecurity: Not on file  ?Transportation Needs: Not on file  ?Physical Activity: Not on file  ?Stress: Not on file  ?Social Connections: Not on file  ?Intimate Partner Violence:  Not on file  ?  ? ?Allergies  ?Allergen Reactions  ? Avelox [Moxifloxacin] Other (See Comments)  ?  Caused tendonitis  ? Penicillins Other (See Comments)  ?  Chills ?  ?  ? ?Outpatient Medications Prior to Visit  ?Medication Sig Dispense Refill  ? acetaminophen (TYLENOL) 500 MG tablet Take 500-1,000 mg by mouth every 6 (six) hours as needed for mild pain or headache.    ? ALPHA LIPOIC ACID PO Take 1 capsule by mouth daily.    ? B Complex-C (B-COMPLEX WITH VITAMIN C) tablet Take 1 tablet by mouth in the morning.    ? celecoxib (CELEBREX) 200 MG capsule Take 200 mg by mouth daily as needed for mild pain.    ? Cholecalciferol (VITAMIN D3) 50 MCG (2000 UT) TABS Take 2,000 Units by mouth daily in the afternoon.    ? escitalopram (LEXAPRO) 20 MG tablet Take 20 mg by mouth daily.    ? levothyroxine (SYNTHROID, LEVOTHROID) 75 MCG tablet Take 1 tablet by mouth daily before breakfast.    ? LORazepam (ATIVAN) 0.5 MG tablet Take 1 tablet (0.5 mg total) by mouth every 8 (eight) hours as needed (dizziness). 10 tablet 0  ? meclizine (ANTIVERT) 25 MG tablet Take 1 tablet (25 mg total) by mouth 3 (three) times daily as needed for dizziness. 30 tablet 0  ? meloxicam (MOBIC) 15 MG tablet Take 1 tablet (15 mg total) by mouth daily. (Patient taking differently: Take 15 mg by mouth daily as needed for pain.) 30 tablet 3  ? tamsulosin (FLOMAX) 0.4 MG CAPS capsule Take 0.4 mg by mouth at bedtime.    ? ?No facility-administered medications prior to visit.  ? ? ?Review of Systems  ?Constitutional:  Negative for chills, fever, malaise/fatigue and weight loss.  ?HENT:  Negative for hearing loss, sore throat and tinnitus.   ?Eyes:  Negative for blurred vision and double vision.  ?Respiratory:  Negative for cough, hemoptysis, sputum production, shortness of breath, wheezing and stridor.   ?Cardiovascular:  Negative for chest pain, palpitations, orthopnea, leg swelling and PND.  ?Gastrointestinal:  Negative for abdominal pain, constipation,  diarrhea, heartburn, nausea and vomiting.  ?Genitourinary:  Negative for dysuria, hematuria and urgency.  ?Musculoskeletal:  Negative for joint pain and myalgias.  ?Skin:  Negative for itching and rash.  ?Neurological:  Negative for dizziness, tingling, weakness and headaches.  ?Endo/Heme/Allergies:  Negative for environmental allergies. Does not bruise/bleed easily.  ?Psychiatric/Behavioral:  Negative for depression. The patient is not nervous/anxious and does not have insomnia.   ?All other systems reviewed and are negative. ? ? ?Objective:  ?Physical Exam ?Vitals reviewed.  ?Constitutional:   ?   General: He is not in acute distress. ?  Appearance: He is well-developed.  ?HENT:  ?   Head: Normocephalic and atraumatic.  ?Eyes:  ?   General: No scleral icterus. ?   Conjunctiva/sclera: Conjunctivae normal.  ?   Pupils: Pupils are equal, round, and reactive to light.  ?Neck:  ?   Vascular: No JVD.  ?   Trachea: No tracheal deviation.  ?Cardiovascular:  ?   Rate and Rhythm: Normal rate and regular rhythm.  ?   Heart sounds: Normal heart sounds. No murmur heard. ?Pulmonary:  ?   Effort: Pulmonary effort is normal. No tachypnea, accessory muscle usage or respiratory distress.  ?   Breath sounds: No stridor. No wheezing, rhonchi or rales.  ?Abdominal:  ?   Palpations: Abdomen is soft.  ?Musculoskeletal:     ?   General: No tenderness.  ?   Cervical back: Neck supple.  ?Lymphadenopathy:  ?   Cervical: No cervical adenopathy.  ?Skin: ?   General: Skin is warm and dry.  ?   Capillary Refill: Capillary refill takes less than 2 seconds.  ?   Findings: No rash.  ?Neurological:  ?   Mental Status: He is alert and oriented to person, place, and time.  ?Psychiatric:     ?   Behavior: Behavior normal.  ? ? ? ?Vitals:  ? 01/20/22 1500  ?BP: 126/88  ?Pulse: 82  ?Temp: 97.9 ?F (36.6 ?C)  ?TempSrc: Oral  ?SpO2: 98%  ?Weight: 205 lb 9.6 oz (93.3 kg)  ?Height: '6\' 5"'$  (1.956 m)  ? ?98% on  RA ?BMI Readings from Last 3 Encounters:   ?01/20/22 24.38 kg/m?  ?05/12/21 24.34 kg/m?  ?03/04/21 24.34 kg/m?  ? ?Wt Readings from Last 3 Encounters:  ?01/20/22 205 lb 9.6 oz (93.3 kg)  ?05/12/21 199 lb 15.3 oz (90.7 kg)  ?03/04/21 200 lb (90.7 kg)  ? ? ? ?

## 2022-01-20 NOTE — Patient Instructions (Signed)
Thank you for visiting Dr. Valeta Harms at [facility]. ?Today we recommend the following: ? ?Orders Placed This Encounter  ?Procedures  ? CT Super D Chest Wo Contrast  ? ?Return in about 1 year (around 01/21/2023) for with Eric Form, NP, or Dr. Valeta Harms. ? ? ? ?Please do your part to reduce the spread of COVID-19.  ? ?

## 2022-01-21 DIAGNOSIS — M62830 Muscle spasm of back: Secondary | ICD-10-CM | POA: Diagnosis not present

## 2022-01-21 DIAGNOSIS — M9903 Segmental and somatic dysfunction of lumbar region: Secondary | ICD-10-CM | POA: Diagnosis not present

## 2022-01-21 DIAGNOSIS — M9905 Segmental and somatic dysfunction of pelvic region: Secondary | ICD-10-CM | POA: Diagnosis not present

## 2022-01-21 DIAGNOSIS — M9902 Segmental and somatic dysfunction of thoracic region: Secondary | ICD-10-CM | POA: Diagnosis not present

## 2022-01-23 DIAGNOSIS — N401 Enlarged prostate with lower urinary tract symptoms: Secondary | ICD-10-CM | POA: Diagnosis not present

## 2022-01-23 DIAGNOSIS — N3289 Other specified disorders of bladder: Secondary | ICD-10-CM | POA: Diagnosis not present

## 2022-01-23 DIAGNOSIS — R338 Other retention of urine: Secondary | ICD-10-CM | POA: Diagnosis not present

## 2022-01-25 ENCOUNTER — Other Ambulatory Visit (HOSPITAL_COMMUNITY): Payer: Self-pay | Admitting: Family Medicine

## 2022-01-25 ENCOUNTER — Other Ambulatory Visit: Payer: Self-pay | Admitting: Family Medicine

## 2022-01-25 DIAGNOSIS — Z6825 Body mass index (BMI) 25.0-25.9, adult: Secondary | ICD-10-CM | POA: Diagnosis not present

## 2022-01-25 DIAGNOSIS — L539 Erythematous condition, unspecified: Secondary | ICD-10-CM

## 2022-01-25 DIAGNOSIS — E663 Overweight: Secondary | ICD-10-CM | POA: Diagnosis not present

## 2022-01-30 ENCOUNTER — Emergency Department (HOSPITAL_COMMUNITY): Payer: Medicare PPO

## 2022-01-30 ENCOUNTER — Encounter (HOSPITAL_COMMUNITY): Payer: Self-pay

## 2022-01-30 ENCOUNTER — Other Ambulatory Visit: Payer: Self-pay

## 2022-01-30 ENCOUNTER — Inpatient Hospital Stay (HOSPITAL_COMMUNITY)
Admission: EM | Admit: 2022-01-30 | Discharge: 2022-02-02 | DRG: 698 | Disposition: A | Discharge status: Home / Self Care | Payer: Medicare PPO | Attending: Family Medicine | Admitting: Family Medicine

## 2022-01-30 DIAGNOSIS — N400 Enlarged prostate without lower urinary tract symptoms: Secondary | ICD-10-CM

## 2022-01-30 DIAGNOSIS — R911 Solitary pulmonary nodule: Secondary | ICD-10-CM | POA: Diagnosis present

## 2022-01-30 DIAGNOSIS — T83511A Infection and inflammatory reaction due to indwelling urethral catheter, initial encounter: Principal | ICD-10-CM | POA: Diagnosis present

## 2022-01-30 DIAGNOSIS — N390 Urinary tract infection, site not specified: Secondary | ICD-10-CM | POA: Diagnosis not present

## 2022-01-30 DIAGNOSIS — R531 Weakness: Secondary | ICD-10-CM | POA: Diagnosis not present

## 2022-01-30 DIAGNOSIS — R338 Other retention of urine: Secondary | ICD-10-CM | POA: Diagnosis present

## 2022-01-30 DIAGNOSIS — M1990 Unspecified osteoarthritis, unspecified site: Secondary | ICD-10-CM | POA: Diagnosis present

## 2022-01-30 DIAGNOSIS — B961 Klebsiella pneumoniae [K. pneumoniae] as the cause of diseases classified elsewhere: Secondary | ICD-10-CM | POA: Diagnosis present

## 2022-01-30 DIAGNOSIS — K4090 Unilateral inguinal hernia, without obstruction or gangrene, not specified as recurrent: Secondary | ICD-10-CM | POA: Diagnosis not present

## 2022-01-30 DIAGNOSIS — N401 Enlarged prostate with lower urinary tract symptoms: Secondary | ICD-10-CM | POA: Diagnosis present

## 2022-01-30 DIAGNOSIS — A4159 Other Gram-negative sepsis: Secondary | ICD-10-CM | POA: Diagnosis present

## 2022-01-30 DIAGNOSIS — I10 Essential (primary) hypertension: Secondary | ICD-10-CM | POA: Diagnosis not present

## 2022-01-30 DIAGNOSIS — R000 Tachycardia, unspecified: Secondary | ICD-10-CM | POA: Diagnosis not present

## 2022-01-30 DIAGNOSIS — Z880 Allergy status to penicillin: Secondary | ICD-10-CM | POA: Diagnosis not present

## 2022-01-30 DIAGNOSIS — R339 Retention of urine, unspecified: Secondary | ICD-10-CM | POA: Diagnosis not present

## 2022-01-30 DIAGNOSIS — K449 Diaphragmatic hernia without obstruction or gangrene: Secondary | ICD-10-CM | POA: Diagnosis not present

## 2022-01-30 DIAGNOSIS — Y846 Urinary catheterization as the cause of abnormal reaction of the patient, or of later complication, without mention of misadventure at the time of the procedure: Secondary | ICD-10-CM | POA: Diagnosis present

## 2022-01-30 DIAGNOSIS — K7689 Other specified diseases of liver: Secondary | ICD-10-CM | POA: Diagnosis not present

## 2022-01-30 DIAGNOSIS — E876 Hypokalemia: Secondary | ICD-10-CM

## 2022-01-30 DIAGNOSIS — N138 Other obstructive and reflux uropathy: Secondary | ICD-10-CM | POA: Diagnosis not present

## 2022-01-30 DIAGNOSIS — R0902 Hypoxemia: Secondary | ICD-10-CM | POA: Diagnosis not present

## 2022-01-30 DIAGNOSIS — R509 Fever, unspecified: Secondary | ICD-10-CM | POA: Diagnosis not present

## 2022-01-30 DIAGNOSIS — Z8249 Family history of ischemic heart disease and other diseases of the circulatory system: Secondary | ICD-10-CM | POA: Diagnosis not present

## 2022-01-30 DIAGNOSIS — Z79890 Hormone replacement therapy (postmenopausal): Secondary | ICD-10-CM | POA: Diagnosis not present

## 2022-01-30 DIAGNOSIS — Z20822 Contact with and (suspected) exposure to covid-19: Secondary | ICD-10-CM | POA: Diagnosis present

## 2022-01-30 DIAGNOSIS — E039 Hypothyroidism, unspecified: Secondary | ICD-10-CM | POA: Diagnosis present

## 2022-01-30 DIAGNOSIS — Z79899 Other long term (current) drug therapy: Secondary | ICD-10-CM

## 2022-01-30 DIAGNOSIS — R6520 Severe sepsis without septic shock: Secondary | ICD-10-CM | POA: Diagnosis not present

## 2022-01-30 DIAGNOSIS — T83511D Infection and inflammatory reaction due to indwelling urethral catheter, subsequent encounter: Secondary | ICD-10-CM | POA: Diagnosis not present

## 2022-01-30 DIAGNOSIS — Z888 Allergy status to other drugs, medicaments and biological substances status: Secondary | ICD-10-CM

## 2022-01-30 DIAGNOSIS — E7132 Disorders of ketone metabolism: Secondary | ICD-10-CM | POA: Diagnosis not present

## 2022-01-30 DIAGNOSIS — A419 Sepsis, unspecified organism: Secondary | ICD-10-CM | POA: Diagnosis not present

## 2022-01-30 LAB — PROTIME-INR
INR: 1.1 (ref 0.8–1.2)
Prothrombin Time: 13.6 seconds (ref 11.4–15.2)

## 2022-01-30 LAB — URINALYSIS, ROUTINE W REFLEX MICROSCOPIC
Bacteria, UA: NONE SEEN
Bilirubin Urine: NEGATIVE
Glucose, UA: NEGATIVE mg/dL
Ketones, ur: NEGATIVE mg/dL
Nitrite: POSITIVE — AB
Protein, ur: NEGATIVE mg/dL
RBC / HPF: >50 RBC/hpf — ABNORMAL HIGH (ref 0–5)
Specific Gravity, Urine: 1.003 — ABNORMAL LOW (ref 1.005–1.030)
pH: 8 (ref 5.0–8.0)

## 2022-01-30 LAB — CBC WITH DIFFERENTIAL/PLATELET
Abs Immature Granulocytes: 0.03 10*3/uL (ref 0.00–0.07)
Basophils Absolute: 0 10*3/uL (ref 0.0–0.1)
Basophils Relative: 0 %
Eosinophils Absolute: 0.1 10*3/uL (ref 0.0–0.5)
Eosinophils Relative: 1 %
HCT: 46.1 % (ref 39.0–52.0)
Hemoglobin: 16 g/dL (ref 13.0–17.0)
Immature Granulocytes: 0 %
Lymphocytes Relative: 3 %
Lymphs Abs: 0.3 10*3/uL — ABNORMAL LOW (ref 0.7–4.0)
MCH: 30 pg (ref 26.0–34.0)
MCHC: 34.7 g/dL (ref 30.0–36.0)
MCV: 86.5 fL (ref 80.0–100.0)
Monocytes Absolute: 0.2 10*3/uL (ref 0.1–1.0)
Monocytes Relative: 2 %
Neutro Abs: 10.3 10*3/uL — ABNORMAL HIGH (ref 1.7–7.7)
Neutrophils Relative %: 94 %
Platelets: 248 10*3/uL (ref 150–400)
RBC: 5.33 MIL/uL (ref 4.22–5.81)
RDW: 12.7 % (ref 11.5–15.5)
WBC: 11 10*3/uL — ABNORMAL HIGH (ref 4.0–10.5)
nRBC: 0 % (ref 0.0–0.2)

## 2022-01-30 LAB — COMPREHENSIVE METABOLIC PANEL
ALT: 29 U/L (ref 0–44)
AST: 33 U/L (ref 15–41)
Albumin: 4.7 g/dL (ref 3.5–5.0)
Alkaline Phosphatase: 70 U/L (ref 38–126)
Anion gap: 9 (ref 5–15)
BUN: 12 mg/dL (ref 8–23)
CO2: 25 mmol/L (ref 22–32)
Calcium: 9.7 mg/dL (ref 8.9–10.3)
Chloride: 102 mmol/L (ref 98–111)
Creatinine, Ser: 0.86 mg/dL (ref 0.61–1.24)
GFR, Estimated: >60 mL/min (ref >60)
Glucose, Bld: 97 mg/dL (ref 70–99)
Potassium: 3.3 mmol/L — ABNORMAL LOW (ref 3.5–5.1)
Sodium: 136 mmol/L (ref 135–145)
Total Bilirubin: 1 mg/dL (ref 0.3–1.2)
Total Protein: 8 g/dL (ref 6.5–8.1)

## 2022-01-30 LAB — APTT: aPTT: 25 seconds (ref 24–36)

## 2022-01-30 LAB — LACTIC ACID, PLASMA
Lactic Acid, Venous: 4.4 mmol/L (ref 0.5–1.9)
Lactic Acid, Venous: 1.4 mmol/L (ref 0.5–1.9)

## 2022-01-30 LAB — RESP PANEL BY RT-PCR (FLU A&B, COVID) ARPGX2
Influenza A by PCR: NEGATIVE
Influenza B by PCR: NEGATIVE
SARS Coronavirus 2 by RT PCR: NEGATIVE

## 2022-01-30 MED ORDER — ONDANSETRON HCL 4 MG/2ML IJ SOLN: 4.0000 mg | Freq: Four times a day (QID) | INTRAMUSCULAR | Status: DC | PRN

## 2022-01-30 MED ORDER — VITAMIN D 25 MCG (1000 UNIT) PO TABS
2000.0000 [IU] | ORAL_TABLET | Freq: Every day | ORAL | Status: DC
  Administered 2022-01-31 – 2022-02-01 (×2): 2000 [IU] via ORAL
  Filled 2022-01-30: qty 2
  Filled 2022-01-30 (×3): qty 1
  Filled 2022-01-30: qty 2
  Filled 2022-01-30: qty 1

## 2022-01-30 MED ORDER — WITCH HAZEL-GLYCERIN EX PADS: MEDICATED_PAD | CUTANEOUS | Status: DC | PRN

## 2022-01-30 MED ORDER — ACETAMINOPHEN 650 MG RE SUPP: 650.0000 mg | Freq: Four times a day (QID) | RECTAL | Status: DC | PRN

## 2022-01-30 MED ORDER — ESCITALOPRAM OXALATE 10 MG PO TABS
20.0000 mg | ORAL_TABLET | Freq: Every day | ORAL | Status: DC
  Administered 2022-01-31 – 2022-02-02 (×3): 20 mg via ORAL
  Filled 2022-01-30 (×3): qty 2

## 2022-01-30 MED ORDER — SODIUM CHLORIDE 0.9 % IV SOLN
2.0000 g | Freq: Once | INTRAVENOUS | Status: AC
  Administered 2022-01-30: 2 g via INTRAVENOUS
  Filled 2022-01-30: qty 12.5

## 2022-01-30 MED ORDER — LACTATED RINGERS IV BOLUS (SEPSIS)
1000.0000 mL | Freq: Once | INTRAVENOUS | Status: AC
  Administered 2022-01-30: 1000 mL via INTRAVENOUS

## 2022-01-30 MED ORDER — SODIUM CHLORIDE 0.9 % IV SOLN
2.0000 g | Freq: Three times a day (TID) | INTRAVENOUS | Status: DC
  Administered 2022-01-30 – 2022-02-02 (×8): 2 g via INTRAVENOUS
  Filled 2022-01-30 (×8): qty 12.5

## 2022-01-30 MED ORDER — LACTATED RINGERS IV BOLUS (SEPSIS)
1000.0000 mL | Freq: Once | INTRAVENOUS | Status: DC
Start: 2022-01-30 — End: 2022-01-30

## 2022-01-30 MED ORDER — LACTATED RINGERS IV SOLN: INTRAVENOUS | Status: AC

## 2022-01-30 MED ORDER — LEVOTHYROXINE SODIUM 75 MCG PO TABS
75.0000 ug | ORAL_TABLET | Freq: Every day | ORAL | Status: DC
  Administered 2022-01-31 – 2022-02-02 (×3): 75 ug via ORAL
  Filled 2022-01-30 (×3): qty 1

## 2022-01-30 MED ORDER — TAMSULOSIN HCL 0.4 MG PO CAPS
0.4000 mg | ORAL_CAPSULE | Freq: Every day | ORAL | Status: DC
  Administered 2022-01-30 – 2022-02-01 (×3): 0.4 mg via ORAL
  Filled 2022-01-30 (×3): qty 1

## 2022-01-30 MED ORDER — ACETAMINOPHEN 500 MG PO TABS
1000.0000 mg | ORAL_TABLET | Freq: Once | ORAL | Status: AC
  Administered 2022-01-30: 1000 mg via ORAL
  Filled 2022-01-30: qty 2

## 2022-01-30 MED ORDER — ACETAMINOPHEN 325 MG PO TABS
650.0000 mg | ORAL_TABLET | Freq: Four times a day (QID) | ORAL | Status: DC | PRN
  Administered 2022-01-30 – 2022-01-31 (×4): 650 mg via ORAL
  Filled 2022-01-30 (×4): qty 2

## 2022-01-30 MED ORDER — LACTATED RINGERS IV SOLN
INTRAVENOUS | Status: DC
Start: 2022-01-30 — End: 2022-01-30

## 2022-01-30 MED ORDER — ENOXAPARIN SODIUM 60 MG/0.6ML IJ SOSY
45.0000 mg | PREFILLED_SYRINGE | INTRAMUSCULAR | Status: DC
  Administered 2022-01-30: 45 mg via SUBCUTANEOUS
  Filled 2022-01-30 (×2): qty 0.6

## 2022-01-30 MED ORDER — ONDANSETRON HCL 4 MG PO TABS: 4.0000 mg | ORAL_TABLET | Freq: Four times a day (QID) | ORAL | Status: DC | PRN

## 2022-01-30 MED ORDER — LACTATED RINGERS IV BOLUS
500.0000 mL | Freq: Once | INTRAVENOUS | Status: AC
  Administered 2022-01-30: 500 mL via INTRAVENOUS

## 2022-01-30 NOTE — Assessment & Plan Note (Signed)
Continue levothyroxine 

## 2022-01-30 NOTE — Progress Notes (Signed)
Pharmacy Antibiotic Note  Micheal Baker is a 76 y.o. male admitted on 01/30/2022 with sepsis.  Pharmacy has been consulted for Cefepime dosing.  Plan: Cefepime 2gm IV q8hrs  Height: '6\' 5"'$  (195.6 cm) Weight: 90.7 kg (200 lb) IBW/kg (Calculated) : 89.1  Temp (24hrs), Avg:100.5 F (38.1 C), Min:100.5 F (38.1 C), Max:100.5 F (38.1 C)  Recent Labs  Lab 01/30/22 1249  WBC 11.0*    CrCl cannot be calculated (Patient's most recent lab result is older than the maximum 21 days allowed.).    Allergies  Allergen Reactions   Avelox [Moxifloxacin] Other (See Comments)    Caused tendonitis   Penicillins Other (See Comments)    Chills     Antimicrobials this admission: Cefepime 5/21 >>    Dose adjustments this admission:   Microbiology results:  BCx: pending  UCx: pending   Sputum:    MRSA PCR:   Thank you for allowing pharmacy to be a part of this patient's care.  [name] 01/30/2022 1:19 PM

## 2022-01-30 NOTE — Assessment & Plan Note (Addendum)
Holding ARB and monitor BP Remains controlled

## 2022-01-30 NOTE — Assessment & Plan Note (Signed)
Continue tamsulosin.

## 2022-01-30 NOTE — Assessment & Plan Note (Addendum)
Presented with fever 103, tachycardia, and lactic acid 4.4. Source is likely urine Personally reviewed CXR--no infiltrates Follow urine cultures Follow blood cultures--neg to date Restart IV fluids Continue empiric cefepime 5/22-fever up to 100.9, but trending down Lactic 4.4>>1.4 Sepsis physiology resolved

## 2022-01-30 NOTE — ED Triage Notes (Signed)
Patient via EMS with complaints of chills. He did have a foley catheter with leg bag placed a week prior.

## 2022-01-30 NOTE — ED Notes (Signed)
Pt reports having a foley catheter placed x1 week ago d/t urinary retention.  Sts he started having chills this morning.  Denies pain and denies n/v/d.

## 2022-01-30 NOTE — ED Provider Notes (Signed)
[facility] EMERGENCY DEPARTMENT Provider Note   CSN: [id number] Arrival date & time: 01/30/22  1228     History  Chief Complaint  Patient presents with   Chills    [name] is a 76 y.o. male reports a history of BPH otherwise healthy.  Patient reports that he experienced urinary retention last week and associated with right flank pain, he reports he was evaluated by his urologist, they did a CT scan and did not find any kidney stones but did notice new enlarged prostate, patient reports that he had a Foley catheter placed by his urologist 1 week ago.  He reports that he has been draining well however over the last 2 days he has experienced chills and fatigue, his symptoms have gradually worsened over the last 2 days, when he read the paperwork given to him by his urologist he saw that if he was experiencing chills he should go to emergency department which is why he presents today.  He denies measuring a fever at home.  Patient reports he is still experiencing some right flank pain this is a mild dull aching sensation without clear aggravating or alleviating factors.  Symptoms are associated with nausea.  Patient denies chest pain, shortness of breath, abdominal pain, testicular pain/swelling, diarrhea, numbness, tingling or any additional concerns.  HPI     Home Medications Prior to Admission medications   Medication Sig Start Date End Date Taking? Authorizing Provider  acetaminophen (TYLENOL) 500 MG tablet Take 500-1,000 mg by mouth every 6 (six) hours as needed for mild pain or headache.   Yes [provider]  ALPHA LIPOIC ACID PO Take 1 capsule by mouth daily.   Yes [provider]  Ascorbic Acid (VITAMIN C) 100 MG tablet Take 100 mg by mouth daily.   Yes [provider]  B Complex-C (B-COMPLEX WITH VITAMIN C) tablet Take 1 tablet by mouth in the morning.   Yes [provider]  celecoxib (CELEBREX) 200 MG capsule Take 200 mg by mouth daily as  needed for mild pain.   Yes [provider]  Cholecalciferol (VITAMIN D3) 50 MCG (2000 UT) TABS Take 2,000 Units by mouth daily in the afternoon.   Yes [provider]  cyclobenzaprine (FLEXERIL) 10 MG tablet Take 10 mg by mouth 3 (three) times daily. 01/14/22  Yes [provider]  escitalopram (LEXAPRO) 20 MG tablet Take 20 mg by mouth daily.   Yes [provider]  levocetirizine (XYZAL) 5 MG tablet Take 5 mg by mouth at bedtime. 01/17/22  Yes [provider]  levothyroxine (SYNTHROID, LEVOTHROID) 75 MCG tablet Take 1 tablet by mouth daily before breakfast. 03/27/15  Yes [provider]  tamsulosin (FLOMAX) 0.4 MG CAPS capsule Take 0.4 mg by mouth at bedtime.   Yes [provider]  LORazepam (ATIVAN) 0.5 MG tablet Take 1 tablet (0.5 mg total) by mouth every 8 (eight) hours as needed (dizziness). Patient not taking: Reported on 01/30/2022 05/12/21   Isla Pence, MD  meclizine (ANTIVERT) 25 MG tablet Take 1 tablet (25 mg total) by mouth 3 (three) times daily as needed for dizziness. Patient not taking: Reported on 01/30/2022 05/12/21   Isla Pence, MD  meloxicam (MOBIC) 15 MG tablet Take 1 tablet (15 mg total) by mouth daily. Patient not taking: Reported on 01/30/2022 04/16/20   Garrel Ridgel, DPM      Allergies    Avelox [moxifloxacin] and Penicillins    Review of Systems   Review  of Systems Ten systems are reviewed and are negative for acute change except as noted in the HPI  Physical Exam Updated Vital Signs BP (!) 174/86   Pulse (!) 118   Temp (!) 103.2 F (39.6 C) (Oral)   Resp 18   Ht '6\' 5"'$  (1.956 m)   Wt 90.7 kg   SpO2 97%   BMI 23.72 kg/m  Physical Exam Constitutional:      General: He is not in acute distress.    Appearance: Normal appearance. He is well-developed. He is not ill-appearing or diaphoretic.  HENT:     Head: Normocephalic and atraumatic.  Eyes:     General: Vision grossly intact. Gaze aligned  appropriately.     Pupils: Pupils are equal, round, and reactive to light.  Neck:     Trachea: Trachea and phonation normal.  Cardiovascular:     Rate and Rhythm: Tachycardia present.  Pulmonary:     Effort: Pulmonary effort is normal. No respiratory distress.  Abdominal:     General: There is no distension.     Palpations: Abdomen is soft.     Tenderness: There is no abdominal tenderness. There is no guarding or rebound.  Musculoskeletal:        General: Normal range of motion.     Cervical back: Normal range of motion.  Skin:    General: Skin is warm and dry.  Neurological:     Mental Status: He is alert.     GCS: GCS eye subscore is 4. GCS verbal subscore is 5. GCS motor subscore is 6.     Comments: Speech is clear and goal oriented, follows commands Major Cranial nerves without deficit, no facial droop Moves extremities without ataxia, coordination intact  Psychiatric:        Behavior: Behavior normal.    ED Results / Procedures / Treatments   Labs (all labs ordered are listed, but only abnormal results are displayed) Labs Reviewed  COMPREHENSIVE METABOLIC PANEL - Abnormal; Notable for the following components:      Result Value   Potassium 3.3 (*)    All other components within normal limits  LACTIC ACID, PLASMA - Abnormal; Notable for the following components:   Lactic Acid, Venous 4.4 (*)    All other components within normal limits  CBC WITH DIFFERENTIAL/PLATELET - Abnormal; Notable for the following components:   WBC 11.0 (*)    Neutro Abs 10.3 (*)    Lymphs Abs 0.3 (*)    All other components within normal limits  URINALYSIS, ROUTINE W REFLEX MICROSCOPIC - Abnormal; Notable for the following components:   Specific Gravity, Urine 1.003 (*)    Hgb urine dipstick MODERATE (*)    Nitrite POSITIVE (*)    Leukocytes,Ua TRACE (*)    RBC / HPF >50 (*)    All other components within normal limits  CULTURE, BLOOD (ROUTINE X 2)  CULTURE, BLOOD (ROUTINE X 2)  RESP  PANEL BY RT-PCR (FLU A&B, COVID) ARPGX2  URINE CULTURE  PROTIME-INR  APTT  LACTIC ACID, PLASMA    EKG None  Radiology DG Chest Port 1 View  Result Date: 01/30/2022 CLINICAL DATA:  Chills and fever for several days. EXAM: PORTABLE CHEST 1 VIEW COMPARISON:  Chest radiograph dated 05/12/2021. FINDINGS: The heart size and mediastinal contours are within normal limits. Calcified granulomas are redemonstrated, otherwise both lungs are clear. Degenerative changes are seen in the spine. IMPRESSION: No active disease. Electronically Signed   By: Harley Hallmark.D.  On: 01/30/2022 13:26    Procedures .Critical Care Performed by: Deliah Boston, PA-C Authorized by: Deliah Boston, PA-C   Critical care provider statement:    Critical care time (minutes):  35   Critical care was necessary to treat or prevent imminent or life-threatening deterioration of the following conditions:  Sepsis   Critical care was time spent personally by me on the following activities:  Development of treatment plan with patient or surrogate, discussions with consultants, evaluation of patient's response to treatment, examination of patient, ordering and review of laboratory studies, ordering and review of radiographic studies, ordering and performing treatments and interventions, pulse oximetry, re-evaluation of patient's condition and review of old charts    Medications Ordered in ED Medications  lactated ringers infusion ( Intravenous New Bag/Given 01/30/22 1320)  ceFEPIme (MAXIPIME) 2 g in sodium chloride 0.9 % 100 mL IVPB (has no administration in time range)  lactated ringers bolus 1,000 mL (0 mLs Intravenous Stopped 01/30/22 1446)    And  lactated ringers bolus 1,000 mL (0 mLs Intravenous Stopped 01/30/22 1446)  ceFEPIme (MAXIPIME) 2 g in sodium chloride 0.9 % 100 mL IVPB (0 g Intravenous Stopped 01/30/22 1440)  acetaminophen (TYLENOL) tablet 1,000 mg (1,000 mg Oral Given 01/30/22 1355)  lactated ringers  bolus 500 mL (500 mLs Intravenous New Bag/Given 01/30/22 1511)    ED Course/ Medical Decision Making/ A&P Clinical Course as of 01/30/22 1538  Sun Jan 30, 2022  1501 Dr Tat [BM]  1534 Lactic acid, plasma(!!) Suspect secondary to sepsis.  Fluid resuscitation ordered. [BM]  1534 Resp Panel by RT-PCR (Flu A&B, Covid) Nasopharyngeal Swab COVID/flu panel negative [BM]  1534 Comprehensive metabolic panel(!) Mild hypokalemia noted, no emergent Electra derangement, AKI, LFT elevations or gap [BM]  1535 CBC with Differential(!)  Mild leukocytosis suspect secondary to UTI.  No anemia or thrombocytopenia. [BM]  1535 Urinalysis, Routine w reflex microscopic Urine, Clean Catch(!) Nitrite positive, trace leukocytes, 6/10 WBCs suggestive of UTI.  Hematuria suspected also from UTI however CT renal stone study ordered to ensure no sign of a kidney stone [BM]  1535 DG Chest [facility] I personally reviewed patient's 1 view chest x-ray, I do not appreciate any obvious PNA/PTX. [BM]    Clinical Course User Index [BM] Gari Crown                           Medical Decision Making 76 year old male presented with fever and tachycardia from home he had a Foley catheter placed 1 week ago for urinary retention secondary to BPH.  He began experiencing symptoms 2 days ago.  Patient meets sepsis criteria, sepsis order set was utilized, antibiotics for suspected urinary source were ordered along with IV fluids.  Amount and/or Complexity of Data Reviewed Labs: ordered. Decision-making details documented in ED Course. Radiology: ordered. Decision-making details documented in ED Course. ECG/medicine tests: ordered.  Risk OTC drugs. Prescription drug management. Decision regarding hospitalization. Risk Details: 76 year old male presented with fever and tachycardia, differential includes but not limited to sepsis, viral infection, bacterial infection.  Patient found to be septic with lactic acidosis  suspected secondary to urinary tract infection.  Patient's blood pressure has been stable/slightly elevated throughout visit.  He was given Tylenol for fever of 103.2.  IV fluids were ordered, patient weighs 90.7 kg, he was given 2 L bolus initially, additional 500 mL bolus was ordered along with maintenance fluids at 150 mL/h for the last  2 hours, this brings patient up to 30 cc/kg (2,837m).  Patient will require admission for further treatment of sepsis.  I discussed the case with Dr. TCarles Colletwho accepted patient for admission.  Nursing staff are exchanging patient's Foley catheter.  On reassessment patient is well-appearing no acute distress, he states understanding of care plan and is in agreement for admission  Critical Care Total time providing critical care: 35 minutes    Note: Portions of this report may have been transcribed using voice recognition software. Every effort was made to ensure accuracy; however, inadvertent computerized transcription errors may still be present.         Final Clinical Impression(s) / ED Diagnoses Final diagnoses:  Sepsis without acute organ dysfunction, due to unspecified organism ([facility]  Urinary tract infection associated with indwelling urethral catheter, initial encounter ([facility]    Rx / [place] Discharge Orders     None         MGari Crown05/21/23 1541    PDavonna Belling MD 01/31/22 [phone]

## 2022-01-30 NOTE — Progress Notes (Signed)
Elink following for sepsis protocol. 

## 2022-01-30 NOTE — Hospital Course (Signed)
76 year old man PMH including right lower lobe lung nodule, BPH, recent issue of urinary retention requiring Foley catheter placement in [facility] emergency department with outpatient follow-up with Dr. Abner Greenspan planned for 5/23, however 5/21 developed fever and chills and was admitted for severe sepsis secondary to UTI.  Condition rapidly improved with antibiotics, will complete oral antibiotics on discharge.  Foley catheter removed.

## 2022-01-30 NOTE — Progress Notes (Addendum)
Following up on 1st lactic acid w/ lab: problems with original sample prohibited resulting. Another lactic acid drawn and resulted at 1440 w/ critical value of 4.4. Dr. Alvino Chapel and Gardiner Rhyme, RN informed. Patient has already received fluid resuscitation.Nuala Alpha PA acknowledges critical value.

## 2022-01-30 NOTE — ED Notes (Signed)
Report called to [place], RN on [phone]

## 2022-01-31 DIAGNOSIS — I10 Essential (primary) hypertension: Secondary | ICD-10-CM | POA: Diagnosis not present

## 2022-01-31 DIAGNOSIS — E876 Hypokalemia: Secondary | ICD-10-CM | POA: Diagnosis not present

## 2022-01-31 DIAGNOSIS — N390 Urinary tract infection, site not specified: Secondary | ICD-10-CM

## 2022-01-31 DIAGNOSIS — A419 Sepsis, unspecified organism: Secondary | ICD-10-CM | POA: Diagnosis not present

## 2022-01-31 DIAGNOSIS — T83511D Infection and inflammatory reaction due to indwelling urethral catheter, subsequent encounter: Secondary | ICD-10-CM

## 2022-01-31 LAB — BASIC METABOLIC PANEL
Anion gap: 5 (ref 5–15)
BUN: 13 mg/dL (ref 8–23)
CO2: 25 mmol/L (ref 22–32)
Calcium: 8.4 mg/dL — ABNORMAL LOW (ref 8.9–10.3)
Chloride: 107 mmol/L (ref 98–111)
Creatinine, Ser: 0.85 mg/dL (ref 0.61–1.24)
GFR, Estimated: >60 mL/min (ref >60)
Glucose, Bld: 114 mg/dL — ABNORMAL HIGH (ref 70–99)
Potassium: 3.4 mmol/L — ABNORMAL LOW (ref 3.5–5.1)
Sodium: 137 mmol/L (ref 135–145)

## 2022-01-31 LAB — CBC
HCT: 36.9 % — ABNORMAL LOW (ref 39.0–52.0)
Hemoglobin: 12.7 g/dL — ABNORMAL LOW (ref 13.0–17.0)
MCH: 30.2 pg (ref 26.0–34.0)
MCHC: 34.4 g/dL (ref 30.0–36.0)
MCV: 87.6 fL (ref 80.0–100.0)
Platelets: 195 10*3/uL (ref 150–400)
RBC: 4.21 MIL/uL — ABNORMAL LOW (ref 4.22–5.81)
RDW: 12.9 % (ref 11.5–15.5)
WBC: 14.1 10*3/uL — ABNORMAL HIGH (ref 4.0–10.5)
nRBC: 0 % (ref 0.0–0.2)

## 2022-01-31 LAB — GLUCOSE, CAPILLARY: Glucose-Capillary: 96 mg/dL (ref 70–99)

## 2022-01-31 LAB — MAGNESIUM: Magnesium: 1.8 mg/dL (ref 1.7–2.4)

## 2022-01-31 MED ORDER — LACTATED RINGERS IV SOLN: INTRAVENOUS | Status: DC

## 2022-01-31 MED ORDER — ENOXAPARIN SODIUM 40 MG/0.4ML IJ SOSY
40.0000 mg | PREFILLED_SYRINGE | INTRAMUSCULAR | Status: DC
  Administered 2022-01-31 – 2022-02-01 (×2): 40 mg via SUBCUTANEOUS
  Filled 2022-01-31 (×2): qty 0.4

## 2022-01-31 MED ORDER — PANTOPRAZOLE SODIUM 40 MG PO TBEC
40.0000 mg | DELAYED_RELEASE_TABLET | Freq: Two times a day (BID) | ORAL | Status: DC
  Administered 2022-01-31 – 2022-02-02 (×4): 40 mg via ORAL
  Filled 2022-01-31 (×4): qty 1

## 2022-01-31 NOTE — H&P (Signed)
History and Physical    Patient: Micheal Baker:[id number] DOB: 10-Oct-1945 DOA: 01/30/2022 DOS: the patient was seen and examined on 01/30/2022 PCP: [facility]  Patient coming from: Home  Chief Complaint:  Chief Complaint  Patient presents with   Chills   HPI: Micheal Baker is a 76 year old male with a history of hypertension, hypothyroidism, BPH, nonsustained V. tach, and right lower lobe lung nodule presenting with fevers and chills that began on the morning of 01/30/2022.  The patient states that he was visiting family in [place] about 1 week prior to this admission.  The patient had issues with urinary retention and urgency.  He went to the emergency department in [facility] where a Foley catheter was placed.  The patient states that he had about 1 L urine outflow immediately after the Foley catheter was placed.  His abdominal discomfort and urgency were relieved, and the patient called to set up an appointment with his usual urologist, Dr. Dannielle Karvonen, for a voiding trial which is scheduled for 02/01/2022.  The patient has had an uneventful several days up until the morning of 01/30/2022 when he began having the fevers and chills.  He denies any chest pain, shortness breath, coughing, hemoptysis, nausea, vomiting, diarrhea, abdominal pain.  He denies any other new medications.  He has been feeling dizzy but denies any syncope, visual disturbance, or focal extremity weakness. In the ED, the patient was febrile up to 103.2 F with tachycardia 120s.  He was hemodynamically stable with oxygen saturation 99% on room air.  BMP showed sodium 136, potassium 3.3, bicarbonate 25, BUN 12, creatinine 0.86.  AST 33, ALT 29, alk phosphatase 70, total bilirubin 1.0, albumin 4.7.  WBC 11.0, hemoglobin 16.0, platelets 240,000.  Chest x-ray was negative for infiltrates.  EKG showed sinus rhythm with no ST-T wave changes.  Lactic acid peaked 4.4.  UA showed 6-10 WBC,  >50 RBC, +nitrite.  The patient was started on cefepime and fluid resuscitated with 1.5 L LR.  Patient was admitted for further evaluation and treatment of his sepsis.  Review of Systems: As mentioned in the history of present illness. All other systems reviewed and are negative. Past Medical History:  Diagnosis Date   Acute medial meniscus tear of left knee    Depression    Frequent PVCs    Hypertension    Hypothyroidism    NSVT (nonsustained ventricular tachycardia) (HCC)    Osteoarthritis    Thyroid disease    Past Surgical History:  Procedure Laterality Date   CATARACT EXTRACTION     left   COLONOSCOPY N/A 04/08/2016   Procedure: COLONOSCOPY;  Surgeon: Danie Binder, MD;  [facility];  Service: Endoscopy;  Laterality: N/A;  11:15 AM   EYE SURGERY     left-scar tissue   groin surgery  2010   growth removed    KNEE ARTHROSCOPY     left   KNEE ARTHROSCOPY WITH LATERAL MENISECTOMY Right 03/11/2015   Procedure: RIGHT KNEE ARTHROSCOPY WITH MEDIALAND LATERAL MENISECTOMY;  Surgeon: Elsie Saas, MD;  Location: [place];  Service: Orthopedics;  Laterality: Right;   KNEE ARTHROSCOPY WITH MEDIAL MENISECTOMY Left 06/27/2014   Procedure: LEFT KNEE ARTHROSCOPY WITH PARTIAL MEDIAL AND LATERAL MENISECTOMIES AND CHONDROPLASTY;  Surgeon: Lorn Junes, MD;  Location: [place];  Service: Orthopedics;  Laterality: Left;   KNEE ARTHROSCOPY WITH MEDIAL MENISECTOMY Right 03/11/2015   Procedure: KNEE ARTHROSCOPY WITH MEDIAL MENISECTOMY;  Surgeon: Elsie Saas, MD;  Location: [place];  Service: Orthopedics;  Laterality: Right;   LEFT HEART CATHETERIZATION WITH CORONARY ANGIOGRAM N/A 09/14/2011   Procedure: LEFT HEART CATHETERIZATION WITH CORONARY ANGIOGRAM;  Surgeon: Pixie Casino, MD;  Location: [facility] CATH LAB;  Service: Cardiovascular;  Laterality: N/A;   POLYPECTOMY  04/08/2016   Procedure: POLYPECTOMY;  Surgeon: Danie Binder, MD;   [facility];  Service: Endoscopy;;  colon    RETINAL DETACHMENT SURGERY Left 2002   Dr. Merlene Morse   Social History:  reports that he has never smoked. He has never used smokeless tobacco. He reports current alcohol use. He reports that he does not use drugs.  Allergies  Allergen Reactions   Avelox [Moxifloxacin] Other (See Comments)    Caused tendonitis   Penicillins Other (See Comments)    Chills     Family History  Problem Relation Age of Onset   Hypertension Mother    Hypertension Father    Heart attack Father    Diabetes Father    Asthma Daughter     Prior to Admission medications   Medication Sig Start Date End Date Taking? Authorizing Provider  acetaminophen (TYLENOL) 500 MG tablet Take 500-1,000 mg by mouth every 6 (six) hours as needed for mild pain or headache.   Yes [provider]  ALPHA LIPOIC ACID PO Take 1 capsule by mouth daily.   Yes [provider]  Ascorbic Acid (VITAMIN C) 100 MG tablet Take 100 mg by mouth daily.   Yes [provider]  B Complex-C (B-COMPLEX WITH VITAMIN C) tablet Take 1 tablet by mouth in the morning.   Yes [provider]  celecoxib (CELEBREX) 200 MG capsule Take 200 mg by mouth daily as needed for mild pain.   Yes [provider]  Cholecalciferol (VITAMIN D3) 50 MCG (2000 UT) TABS Take 2,000 Units by mouth daily in the afternoon.   Yes [provider]  cyclobenzaprine (FLEXERIL) 10 MG tablet Take 10 mg by mouth 3 (three) times daily. 01/14/22  Yes [provider]  escitalopram (LEXAPRO) 20 MG tablet Take 20 mg by mouth daily.   Yes [provider]  levocetirizine (XYZAL) 5 MG tablet Take 5 mg by mouth at bedtime. 01/17/22  Yes [provider]  levothyroxine (SYNTHROID, LEVOTHROID) 75 MCG tablet Take 1 tablet by mouth daily before breakfast. 03/27/15  Yes [provider]  tamsulosin (FLOMAX) 0.4 MG CAPS capsule Take 0.4 mg by mouth at bedtime.    Yes [provider]  LORazepam (ATIVAN) 0.5 MG tablet Take 1 tablet (0.5 mg total) by mouth every 8 (eight) hours as needed (dizziness). Patient not taking: Reported on 01/30/2022 05/12/21   Isla Pence, MD  meclizine (ANTIVERT) 25 MG tablet Take 1 tablet (25 mg total) by mouth 3 (three) times daily as needed for dizziness. Patient not taking: Reported on 01/30/2022 05/12/21   Isla Pence, MD  meloxicam (MOBIC) 15 MG tablet Take 1 tablet (15 mg total) by mouth daily. Patient not taking: Reported on 01/30/2022 04/16/20   Garrel Ridgel, [place]    Physical Exam: Vitals:   01/30/22 1940 01/30/22 1948 01/30/22 2028 01/31/22 0231  BP: (!) 115/96  109/77 118/70  Pulse: 96 98 96 98  Resp: '13 15  16  ' Temp:   98 F (36.7 C) 99.7 F (37.6 C)  TempSrc:   Oral Oral  SpO2: 97% 97% 98% 97%  Weight:   92.4 kg   Height:  '6\' 5"'  (1.956 m)    GENERAL:  A&O x 3, NAD, well developed, cooperative, follows commands HEENT: Day/AT, No thrush, No icterus, No oral ulcers Neck:  No neck mass, No meningismus, soft, supple CV: RRR, no S3, no S4, no rub, no JVD Lungs:  CTA, no wheeze, no rhonchi, good air movement Abd: soft/NT +BS, nondistended Ext: No edema, no lymphangitis, no cyanosis, no rashes Neuro:  CN II-XII intact, strength 4/5 in RUE, RLE, strength 4/5 LUE, LLE; sensation intact bilateral; no dysmetria; babinski equivocal  Data Reviewed: Data reviewed in history  Assessment and Plan: * Severe sepsis ([place]) Presented with fever 103, tachycardia, and lactic acid 4.4. Source is likely urine Follow urine cultures Follow blood cultures Continue IV fluids Continue empiric cefepime Foley catheter changed in the ED  Essential hypertension Continue olmesartan  Hypothyroidism Continue levothyroxine  BPH (benign prostatic hyperplasia) Continue tamsulosin      Advance Care Planning: FULL CODE  Consults: none  Family Communication: none  Severity of Illness: The appropriate patient  status for this patient is INPATIENT. Inpatient status is judged to be reasonable and necessary in order to provide the required intensity of service to ensure the patient's safety. The patient's presenting symptoms, physical exam findings, and initial radiographic and laboratory data in the context of their chronic comorbidities is felt to place them at high risk for further clinical deterioration. Furthermore, it is not anticipated that the patient will be medically stable for discharge from the hospital within 2 midnights of admission.   * I certify that at the point of admission it is my clinical judgment that the patient will require inpatient hospital care spanning beyond 2 midnights from the point of admission due to high intensity of service, high risk for further deterioration and high frequency of surveillance required.*  Author: Orson Eva, MD 01/31/2022 9:11 AM  For on call review [email or web address].

## 2022-01-31 NOTE — Assessment & Plan Note (Addendum)
Repleted Mag 2.0

## 2022-01-31 NOTE — Assessment & Plan Note (Signed)
Continue empiric cefepime pending culture data --prelim culture = Klebsiella ornithinolytica --plan to remove current foley for voiding trial at 7:30 AM on 5/24 --discussed with urology, Dr. Felipa Eth

## 2022-01-31 NOTE — Progress Notes (Signed)
PROGRESS NOTE  Micheal Baker XUX:[id number] DOB: Mar 22, 1946 DOA: 01/30/2022 PCP: [facility]  Brief History:  76 year old male with a history of hypertension, hypothyroidism, BPH, nonsustained V. tach, and right lower lobe lung nodule presenting with fevers and chills that began on the morning of 01/30/2022.  The patient states that he was visiting family in [place] about 1 week prior to this admission.  The patient had issues with urinary retention and urgency.  He went to the emergency department in [facility] where a Foley catheter was placed.  The patient states that he had about 1 L urine outflow immediately after the Foley catheter was placed.  His abdominal discomfort and urgency were relieved, and the patient called to set up an appointment with his usual urologist, Dr. Dannielle Karvonen, for a voiding trial which is scheduled for 02/01/2022.  The patient has had an uneventful several days up until the morning of 01/30/2022 when he began having the fevers and chills.  He denies any chest pain, shortness breath, coughing, hemoptysis, nausea, vomiting, diarrhea, abdominal pain.  He denies any other new medications.  He has been feeling dizzy but denies any syncope, visual disturbance, or focal extremity weakness. In the ED, the patient was febrile up to 103.2 F with tachycardia 120s.  He was hemodynamically stable with oxygen saturation 99% on room air.  BMP showed sodium 136, potassium 3.3, bicarbonate 25, BUN 12, creatinine 0.86.  AST 33, ALT 29, alk phosphatase 70, total bilirubin 1.0, albumin 4.7.  WBC 11.0, hemoglobin 16.0, platelets 240,000.  Chest x-ray was negative for infiltrates.  EKG showed sinus rhythm with no ST-T wave changes.  Lactic acid peaked 4.4.  UA showed 6-10 WBC, >50 RBC, +nitrite.  The patient was started on cefepime and fluid resuscitated with 1.5 L LR.  Patient was admitted for further evaluation and treatment of his sepsis.      Assessment and Plan: * Severe sepsis ([place]) Presented with fever 103, tachycardia, and lactic acid 4.4. Source is likely urine Personally reviewed CXR--no infiltrates Follow urine cultures Follow blood cultures--neg to date Restart IV fluids Continue empiric cefepime 5/22-fever up to 100.9, but trending down Lactic 4.4>>1.4  Essential hypertension Holding ARB and monitor BP Remains controlled  Hypothyroidism Continue levothyroxine  Urinary tract infection associated with indwelling urethral catheter (HCC) Continue empiric cefepime pending culture data  Hypokalemia Replete Mag 1.8  BPH (benign prostatic hyperplasia) Continue tamsulosin      Family Communication:   no Family at bedside  Consultants:  none  Code Status:  FULL  DVT Prophylaxis:  [place] Lovenox   Procedures: As Listed in Progress Note Above  Antibiotics: Cefepime 5/21>>      Subjective: Patient complains of periumbilical pain with eating.  No vomiting or diarrhea.  Denies cp, sob, cough, hematochezia or melena  Objective: Vitals:   01/31/22 0231 01/31/22 1023 01/31/22 1350 01/31/22 1507  BP: 118/70 (!) 144/84 139/82 126/72  Pulse: 98 96 98 86  Resp: 16   18  Temp: 99.7 F (37.6 C) 99 F (37.2 C)  (!) 100.9 F (38.3 C)  TempSrc: Oral   Oral  SpO2: 97% 100% 98% 98%  Weight:      Height:        Intake/Output Summary (Last 24 hours) at 01/31/2022 1603 Last data filed at 01/31/2022 1240 Gross per 24 hour  Intake 1097.34 ml  Output 1200 ml  Net -102.66 ml  Weight change:  Exam:  General:  Pt is alert, follows commands appropriately, not in acute distress HEENT: No icterus, No thrush, No neck mass, [place]/AT Cardiovascular: RRR, S1/S2, no rubs, no gallops Respiratory: CTA bilaterally, no wheezing, no crackles, no rhonchi Abdomen: Soft/+BS, non tender, non distended, no guarding Extremities: No edema, No lymphangitis, No petechiae, No rashes, no synovitis   Data Reviewed: I  have personally reviewed following labs and imaging studies Basic Metabolic Panel: Recent Labs  Lab 01/30/22 1249 01/31/22 0454  NA 136 137  K 3.3* 3.4*  CL 102 107  CO2 25 25  GLUCOSE 97 114*  BUN 12 13  CREATININE 0.86 0.85  CALCIUM 9.7 8.4*  MG  --  1.8   Liver Function Tests: Recent Labs  Lab 01/30/22 1249  AST 33  ALT 29  ALKPHOS 70  BILITOT 1.0  PROT 8.0  ALBUMIN 4.7   No results for input(s): LIPASE, AMYLASE in the last 168 hours. No results for input(s): AMMONIA in the last 168 hours. Coagulation Profile: Recent Labs  Lab 01/30/22 1249  INR 1.1   CBC: Recent Labs  Lab 01/30/22 1249 01/31/22 0454  WBC 11.0* 14.1*  NEUTROABS 10.3*  --   HGB 16.0 12.7*  HCT 46.1 36.9*  MCV 86.5 87.6  PLT 248 195   Cardiac Enzymes: No results for input(s): CKTOTAL, CKMB, CKMBINDEX, TROPONINI in the last 168 hours. BNP: Invalid input(s): POCBNP CBG: Recent Labs  Lab 01/31/22 1114  GLUCAP 96   HbA1C: No results for input(s): HGBA1C in the last 72 hours. Urine analysis:    Component Value Date/Time   COLORURINE YELLOW 01/30/2022 [place] 01/30/2022 1245   LABSPEC 1.003 (L) 01/30/2022 1245   PHURINE 8.0 01/30/2022 1245   GLUCOSEU NEGATIVE 01/30/2022 1245   HGBUR MODERATE (A) 01/30/2022 1245   BILIRUBINUR NEGATIVE 01/30/2022 1245   KETONESUR NEGATIVE 01/30/2022 1245   PROTEINUR NEGATIVE 01/30/2022 1245   UROBILINOGEN 0.2 08/08/2011 0800   NITRITE POSITIVE (A) 01/30/2022 1245   LEUKOCYTESUR TRACE (A) 01/30/2022 1245   Sepsis Labs: _0 (procalcitonin:4,lacticidven:4) ) Recent Results (from the past 240 hour(s))  Culture, blood (Routine x 2)     Status: None (Preliminary result)   Collection Time: 01/30/22 12:49 PM   Specimen: BLOOD  Result Value Ref Range Status   Specimen Description BLOOD BLOOD RIGHT ARM  Final   Special Requests   Final    BOTTLES DRAWN AEROBIC AND ANAEROBIC Blood Culture adequate volume   Culture   Final     NO GROWTH < 12 HOURS Performed at [facility], [address] [place], [place] [address]    Report Status PENDING  Incomplete  Culture, blood (Routine x 2)     Status: None (Preliminary result)   Collection Time: 01/30/22 12:49 PM   Specimen: BLOOD  Result Value Ref Range Status   Specimen Description BLOOD BLOOD LEFT ARM  Final   Special Requests   Final    BOTTLES DRAWN AEROBIC AND ANAEROBIC Blood Culture adequate volume   Culture   Final    NO GROWTH < 12 HOURS Performed at [facility], [address] [place], [place] [address]    Report Status PENDING  Incomplete  Resp Panel by RT-PCR (Flu A&B, Covid) Nasopharyngeal Swab     Status: None   Collection Time: 01/30/22  1:30 PM   Specimen: Nasopharyngeal Swab; Nasopharyngeal(NP) swabs in vial transport medium  Result Value Ref Range Status   SARS Coronavirus 2 by RT  PCR NEGATIVE NEGATIVE Final    Comment: (NOTE) SARS-CoV-2 target nucleic acids are NOT DETECTED.  The SARS-CoV-2 RNA is generally detectable in upper respiratory specimens during the acute phase of infection. The lowest concentration of SARS-CoV-2 viral copies this assay can detect is 138 copies/mL. A negative result does not preclude SARS-Cov-2 infection and should not be used as the sole basis for treatment or other patient management decisions. A negative result may occur with  improper specimen collection/handling, submission of specimen other than nasopharyngeal swab, presence of viral mutation(s) within the areas targeted by this assay, and inadequate number of viral copies(<138 copies/mL). A negative result must be combined with clinical observations, patient history, and epidemiological information. The expected result is Negative.  Fact Sheet for Patients:  EntrepreneurPulse.com.au  Fact Sheet for Healthcare Providers:  IncredibleEmployment.be  This test is no t yet approved or cleared by the [place] FDA and   has been authorized for detection and/or diagnosis of SARS-CoV-2 by FDA under an Emergency Use Authorization (EUA). This EUA will remain  in effect (meaning this test can be used) for the duration of the COVID-19 declaration under Section 564(b)(1) of the Act, 21 U.S.C.section 360bbb-3(b)(1), unless the authorization is terminated  or revoked sooner.       Influenza A by PCR NEGATIVE NEGATIVE Final   Influenza B by PCR NEGATIVE NEGATIVE Final    Comment: (NOTE) The Xpert Xpress SARS-CoV-2/FLU/RSV plus assay is intended as an aid in the diagnosis of influenza from Nasopharyngeal swab specimens and should not be used as a sole basis for treatment. Nasal washings and aspirates are unacceptable for Xpert Xpress SARS-CoV-2/FLU/RSV testing.  Fact Sheet for Patients: EntrepreneurPulse.com.au  Fact Sheet for Healthcare Providers: IncredibleEmployment.be  This test is not yet approved or cleared by the [place] FDA and has been authorized for detection and/or diagnosis of SARS-CoV-2 by FDA under an Emergency Use Authorization (EUA). This EUA will remain in effect (meaning this test can be used) for the duration of the COVID-19 declaration under Section 564(b)(1) of the Act, 21 U.S.C. section 360bbb-3(b)(1), unless the authorization is terminated or revoked.  Performed at [facility], [address], [place], [place] [address]      Scheduled Meds:  cholecalciferol  2,000 Units Oral Q1500   enoxaparin (LOVENOX) injection  40 mg Subcutaneous Q24H   escitalopram  20 mg Oral Daily   levothyroxine  75 mcg Oral QAC breakfast   pantoprazole  40 mg Oral BID AC   tamsulosin  0.4 mg Oral QHS   Continuous Infusions:  ceFEPime (MAXIPIME) IV 2 g (01/31/22 1404)   lactated ringers      Procedures/Studies: DG Chest Port 1 View  Result Date: 01/30/2022 CLINICAL DATA:  Chills and fever for several days. EXAM: PORTABLE CHEST 1 VIEW COMPARISON:  Chest  radiograph dated 05/12/2021. FINDINGS: The heart size and mediastinal contours are within normal limits. Calcified granulomas are redemonstrated, otherwise both lungs are clear. Degenerative changes are seen in the spine. IMPRESSION: No active disease. Electronically Signed   By: Zerita Boers M.D.   On: 01/30/2022 13:26   CT Renal Stone Study  Result Date: 01/30/2022 CLINICAL DATA:  Flank pain. EXAM: CT ABDOMEN AND PELVIS WITHOUT CONTRAST TECHNIQUE: Multidetector CT imaging of the abdomen and pelvis was performed following the standard protocol without IV contrast. RADIATION DOSE REDUCTION: This exam was performed according to the departmental dose-optimization program which includes automated exposure control, adjustment of the mA and/or kV according to patient size and/or use  of iterative reconstruction technique. COMPARISON:  CT abdomen and pelvis 01/18/2007 FINDINGS: Lower chest: There is a new 6 mm nodule in the left lung base. Lung bases are otherwise clear. There are calcified granulomas in the lung bases. Hepatobiliary: Calcified granulomas are present. There is a 3 cm cyst in the inferior right lobe of the liver which has slightly increased in size compared to the prior study. Gallbladder and bile ducts are within normal limits. Pancreas: Unremarkable. No pancreatic ductal dilatation or surrounding inflammatory changes. Spleen: Calcified granulomas are present. Otherwise within normal limits. Adrenals/Urinary Tract: Foley catheter is seen within the bladder. No urinary tract calculus or hydronephrosis. Adrenal glands within normal limits. Stomach/Bowel: Stomach is within normal limits. Appendix appears normal. No evidence of bowel wall thickening, distention, or inflammatory changes. Appendix is not seen. Small hiatal hernia and duodenal diverticulum again noted. Vascular/Lymphatic: Aortic atherosclerosis. No enlarged abdominal or pelvic lymph nodes. Reproductive: Prostate gland is enlarged and  lobulated with nodular protrusion into the bladder base. Prostate measures 7.0 x 7.3 by 5.9 cm and has significantly increased in size. Other: No ascites.  Small fat containing inguinal hernias. Musculoskeletal: No acute or significant osseous findings. IMPRESSION: 1. No evidence for urinary tract calculus or hydronephrosis. 2. Foley catheter in the bladder. 3. Marked enlargement of the prostate gland with nodular protrusion into the bladder base, new from 2008. Recommend clinical correlation and follow-up to exclude neoplasm. 4. Hepatic cyst. 5. Old granulomatous disease. 6. 6 mm left solid pulmonary nodule. Recommend a non-contrast Chest CT at 6-12 months. If patient is high risk for malignancy, recommend an additional non-contrast Chest CT at 18-24 months; if patient is low risk for malignancy a non-contrast Chest CT at 18-24 months is optional. These guidelines do not apply to immunocompromised patients and patients with cancer. Follow up in patients with significant comorbidities as clinically warranted. For lung cancer screening, adhere to Lung-RADS guidelines. Reference: Radiology. 2017; 284(1):228-43. Electronically Signed   By: Ronney Asters M.D.   On: 01/30/2022 16:15    Orson Eva, DO  [facility]  If 7PM-7AM, please contact night-coverage [email or web address] Password [email or web address] 01/31/2022, 4:03 PM   LOS: 1 day

## 2022-01-31 NOTE — TOC Progression Note (Signed)
  Transition of Care [facility]) Screening Note   Patient Details  Name: Micheal Baker Date of Birth: Sep 12, 1946   Transition of Care [facility]) CM/SW Contact:    Shade Flood, LCSW Phone Number: 01/31/2022, 9:44 AM    Transition of Care Department [facility]) has reviewed patient and no TOC needs have been identified at this time. We will continue to monitor patient advancement through interdisciplinary progression rounds. If new patient transition needs arise, please place a TOC consult.

## 2022-02-01 DIAGNOSIS — A419 Sepsis, unspecified organism: Secondary | ICD-10-CM | POA: Diagnosis not present

## 2022-02-01 DIAGNOSIS — E876 Hypokalemia: Secondary | ICD-10-CM | POA: Diagnosis not present

## 2022-02-01 DIAGNOSIS — I10 Essential (primary) hypertension: Secondary | ICD-10-CM | POA: Diagnosis not present

## 2022-02-01 DIAGNOSIS — T83511D Infection and inflammatory reaction due to indwelling urethral catheter, subsequent encounter: Secondary | ICD-10-CM | POA: Diagnosis not present

## 2022-02-01 LAB — COMPREHENSIVE METABOLIC PANEL
ALT: 14 U/L (ref 0–44)
AST: 17 U/L (ref 15–41)
Albumin: 2.8 g/dL — ABNORMAL LOW (ref 3.5–5.0)
Alkaline Phosphatase: 38 U/L (ref 38–126)
Anion gap: 6 (ref 5–15)
BUN: 11 mg/dL (ref 8–23)
CO2: 22 mmol/L (ref 22–32)
Calcium: 8.2 mg/dL — ABNORMAL LOW (ref 8.9–10.3)
Chloride: 109 mmol/L (ref 98–111)
Creatinine, Ser: 0.8 mg/dL (ref 0.61–1.24)
GFR, Estimated: >60 mL/min (ref >60)
Glucose, Bld: 95 mg/dL (ref 70–99)
Potassium: 3.4 mmol/L — ABNORMAL LOW (ref 3.5–5.1)
Sodium: 137 mmol/L (ref 135–145)
Total Bilirubin: 1 mg/dL (ref 0.3–1.2)
Total Protein: 5.3 g/dL — ABNORMAL LOW (ref 6.5–8.1)

## 2022-02-01 LAB — CBC
HCT: 36.7 % — ABNORMAL LOW (ref 39.0–52.0)
Hemoglobin: 12.3 g/dL — ABNORMAL LOW (ref 13.0–17.0)
MCH: 30 pg (ref 26.0–34.0)
MCHC: 33.5 g/dL (ref 30.0–36.0)
MCV: 89.5 fL (ref 80.0–100.0)
Platelets: 153 10*3/uL (ref 150–400)
RBC: 4.1 MIL/uL — ABNORMAL LOW (ref 4.22–5.81)
RDW: 12.9 % (ref 11.5–15.5)
WBC: 10.5 10*3/uL (ref 4.0–10.5)
nRBC: 0 % (ref 0.0–0.2)

## 2022-02-01 LAB — MAGNESIUM: Magnesium: 2 mg/dL (ref 1.7–2.4)

## 2022-02-01 LAB — LIPASE, BLOOD: Lipase: 23 U/L (ref 11–51)

## 2022-02-01 MED ORDER — POTASSIUM CHLORIDE CRYS ER 20 MEQ PO TBCR
20.0000 meq | EXTENDED_RELEASE_TABLET | Freq: Once | ORAL | Status: AC
  Administered 2022-02-01: 20 meq via ORAL
  Filled 2022-02-01: qty 1

## 2022-02-01 NOTE — Progress Notes (Signed)
Patient has had uneventful shift. Patient did have small fever at bedtime, and medicated with tylenol prn.  Patient has had no new concerns during shift.

## 2022-02-01 NOTE — Progress Notes (Signed)
PROGRESS NOTE  Micheal Baker FEX:[id number] DOB: 01/30/1946 DOA: 01/30/2022 PCP: [facility]  Brief History:  76 year old male with a history of hypertension, hypothyroidism, BPH, nonsustained V. tach, and right lower lobe lung nodule presenting with fevers and chills that began on the morning of 01/30/2022.  The patient states that he was visiting family in [place] about 1 week prior to this admission.  The patient had issues with urinary retention and urgency.  He went to the emergency department in [facility] where a Foley catheter was placed.  The patient states that he had about 1 L urine outflow immediately after the Foley catheter was placed.  His abdominal discomfort and urgency were relieved, and the patient called to set up an appointment with his usual urologist, Dr. Dannielle Karvonen, for a voiding trial which is scheduled for 02/01/2022.  The patient has had an uneventful several days up until the morning of 01/30/2022 when he began having the fevers and chills.  He denies any chest pain, shortness breath, coughing, hemoptysis, nausea, vomiting, diarrhea, abdominal pain.  He denies any other new medications.  He has been feeling dizzy but denies any syncope, visual disturbance, or focal extremity weakness. In the ED, the patient was febrile up to 103.2 F with tachycardia 120s.  He was hemodynamically stable with oxygen saturation 99% on room air.  BMP showed sodium 136, potassium 3.3, bicarbonate 25, BUN 12, creatinine 0.86.  AST 33, ALT 29, alk phosphatase 70, total bilirubin 1.0, albumin 4.7.  WBC 11.0, hemoglobin 16.0, platelets 240,000.  Chest x-ray was negative for infiltrates.  EKG showed sinus rhythm with no ST-T wave changes.  Lactic acid peaked 4.4.  UA showed 6-10 WBC, >50 RBC, +nitrite.  The patient was started on cefepime and fluid resuscitated with 1.5 L LR.  Patient was admitted for further evaluation and treatment of his sepsis.      Assessment and Plan: * Severe sepsis ([place]) Presented with fever 103, tachycardia, and lactic acid 4.4. Source is likely urine Personally reviewed CXR--no infiltrates Follow urine cultures Follow blood cultures--neg to date Restart IV fluids Continue empiric cefepime 5/22-fever up to 100.9, but trending down Lactic 4.4>>1.4 Sepsis physiology resolved  Essential hypertension Holding ARB and monitor BP Remains controlled  Hypothyroidism Continue levothyroxine  Urinary tract infection associated with indwelling urethral catheter (HCC) Continue empiric cefepime pending culture data --prelim culture = Klebsiella ornithinolytica --plan to remove current foley for voiding trial at 7:30 AM on 5/24 --discussed with urology, Dr. Felipa Eth  Hypokalemia Repleted Mag 2.0  BPH (benign prostatic hyperplasia) Continue tamsulosin   Family Communication:   no Family at bedside   Consultants:  none   Code Status:  FULL   DVT Prophylaxis:  [place] Lovenox     Procedures: As Listed in Progress Note Above   Antibiotics: Cefepime 5/21>>            Subjective: Patient denies fevers, chills, headache, chest pain, dyspnea, nausea, vomiting, diarrhea, abdominal pain, dysuria, hematuria, hematochezia, and melena.   Objective: Vitals:   01/31/22 1507 01/31/22 2100 02/01/22 0451 02/01/22 1406  BP: 126/72 123/68 (!) 107/46 (!) 126/96  Pulse: 86 (!) 101 81 69  Resp: _0 Temp: (!) 100.9 F (38.3 C) 99.9 F (37.7 C) 99.8 F (37.7 C) 98.6 F (37 C)  TempSrc: Oral Oral    SpO2: 98% 99% 95% 98%  Weight:  Height:        Intake/Output Summary (Last 24 hours) at 02/01/2022 1657 Last data filed at 02/01/2022 1300 Gross per 24 hour  Intake 2847.83 ml  Output 1500 ml  Net 1347.83 ml   Weight change:  Exam:  General:  Pt is alert, follows commands appropriately, not in acute distress HEENT: No icterus, No thrush, No neck mass, [place]/AT Cardiovascular: RRR,  S1/S2, no rubs, no gallops Respiratory: CTA bilaterally, no wheezing, no crackles, no rhonchi Abdomen: Soft/+BS, non tender, non distended, no guarding Extremities: No edema, No lymphangitis, No petechiae, No rashes, no synovitis   Data Reviewed: I have personally reviewed following labs and imaging studies Basic Metabolic Panel: Recent Labs  Lab 01/30/22 1249 01/31/22 0454 02/01/22 0458  NA 136 137 137  K 3.3* 3.4* 3.4*  CL 102 107 109  CO2 _0 GLUCOSE 97 114* 95  BUN _1 CREATININE 0.86 0.85 0.80  CALCIUM 9.7 8.4* 8.2*  MG  --  1.8 2.0   Liver Function Tests: Recent Labs  Lab 01/30/22 1249 02/01/22 0458  AST 33 17  ALT 29 14  ALKPHOS 70 38  BILITOT 1.0 1.0  PROT 8.0 5.3*  ALBUMIN 4.7 2.8*   Recent Labs  Lab 02/01/22 0458  LIPASE 23   No results for input(s): AMMONIA in the last 168 hours. Coagulation Profile: Recent Labs  Lab 01/30/22 1249  INR 1.1   CBC: Recent Labs  Lab 01/30/22 1249 01/31/22 0454 02/01/22 0458  WBC 11.0* 14.1* 10.5  NEUTROABS 10.3*  --   --   HGB 16.0 12.7* 12.3*  HCT 46.1 36.9* 36.7*  MCV 86.5 87.6 89.5  PLT 248 195 153   Cardiac Enzymes: No results for input(s): CKTOTAL, CKMB, CKMBINDEX, TROPONINI in the last 168 hours. BNP: Invalid input(s): POCBNP CBG: Recent Labs  Lab 01/31/22 1114  GLUCAP 96   HbA1C: No results for input(s): HGBA1C in the last 72 hours. Urine analysis:    Component Value Date/Time   COLORURINE YELLOW 01/30/2022 1245   APPEARANCEUR CLEAR 01/30/2022 1245   LABSPEC 1.003 (L) 01/30/2022 1245   PHURINE 8.0 01/30/2022 1245   GLUCOSEU NEGATIVE 01/30/2022 1245   HGBUR MODERATE (A) 01/30/2022 1245   BILIRUBINUR NEGATIVE 01/30/2022 1245   KETONESUR NEGATIVE 01/30/2022 1245   PROTEINUR NEGATIVE 01/30/2022 1245   UROBILINOGEN 0.2 08/08/2011 0800   NITRITE POSITIVE (A) 01/30/2022 1245   LEUKOCYTESUR TRACE (A) 01/30/2022 1245   Sepsis  Labs: _2 (procalcitonin:4,lacticidven:4) ) Recent Results (from the past 240 hour(s))  Culture, blood (Routine x 2)     Status: None (Preliminary result)   Collection Time: 01/30/22 12:49 PM   Specimen: BLOOD  Result Value Ref Range Status   Specimen Description BLOOD BLOOD RIGHT ARM  Final   Special Requests   Final    BOTTLES DRAWN AEROBIC AND ANAEROBIC Blood Culture adequate volume   Culture   Final    NO GROWTH 2 DAYS Performed at [facility], [address] [place], [place] [address]    Report Status PENDING  Incomplete  Culture, blood (Routine x 2)     Status: None (Preliminary result)   Collection Time: 01/30/22 12:49 PM   Specimen: BLOOD  Result Value Ref Range Status   Specimen Description BLOOD BLOOD LEFT ARM  Final   Special Requests   Final    BOTTLES DRAWN AEROBIC AND ANAEROBIC Blood Culture adequate volume   Culture   Final    NO GROWTH 2 DAYS  Performed at [facility], [address] [place], [place] [address]    Report Status PENDING  Incomplete  Resp Panel by RT-PCR (Flu A&B, Covid) Nasopharyngeal Swab     Status: None   Collection Time: 01/30/22  1:30 PM   Specimen: Nasopharyngeal Swab; Nasopharyngeal(NP) swabs in vial transport medium  Result Value Ref Range Status   SARS Coronavirus 2 by RT PCR NEGATIVE NEGATIVE Final    Comment: (NOTE) SARS-CoV-2 target nucleic acids are NOT DETECTED.  The SARS-CoV-2 RNA is generally detectable in upper respiratory specimens during the acute phase of infection. The lowest concentration of SARS-CoV-2 viral copies this assay can detect is 138 copies/mL. A negative result does not preclude SARS-Cov-2 infection and should not be used as the sole basis for treatment or other patient management decisions. A negative result may occur with  improper specimen collection/handling, submission of specimen other than nasopharyngeal swab, presence of viral mutation(s) within the areas targeted by this assay, and inadequate  number of viral copies(<138 copies/mL). A negative result must be combined with clinical observations, patient history, and epidemiological information. The expected result is Negative.  Fact Sheet for Patients:  EntrepreneurPulse.com.au  Fact Sheet for Healthcare Providers:  IncredibleEmployment.be  This test is no t yet approved or cleared by the [place] FDA and  has been authorized for detection and/or diagnosis of SARS-CoV-2 by FDA under an Emergency Use Authorization (EUA). This EUA will remain  in effect (meaning this test can be used) for the duration of the COVID-19 declaration under Section 564(b)(1) of the Act, 21 U.S.C.section 360bbb-3(b)(1), unless the authorization is terminated  or revoked sooner.       Influenza A by PCR NEGATIVE NEGATIVE Final   Influenza B by PCR NEGATIVE NEGATIVE Final    Comment: (NOTE) The Xpert Xpress SARS-CoV-2/FLU/RSV plus assay is intended as an aid in the diagnosis of influenza from Nasopharyngeal swab specimens and should not be used as a sole basis for treatment. Nasal washings and aspirates are unacceptable for Xpert Xpress SARS-CoV-2/FLU/RSV testing.  Fact Sheet for Patients: EntrepreneurPulse.com.au  Fact Sheet for Healthcare Providers: IncredibleEmployment.be  This test is not yet approved or cleared by the [place] FDA and has been authorized for detection and/or diagnosis of SARS-CoV-2 by FDA under an Emergency Use Authorization (EUA). This EUA will remain in effect (meaning this test can be used) for the duration of the COVID-19 declaration under Section 564(b)(1) of the Act, 21 U.S.C. section 360bbb-3(b)(1), unless the authorization is terminated or revoked.  Performed at [facility], [address], [place], [place] [address]   Urine Culture     Status: Abnormal (Preliminary result)   Collection Time: 01/30/22  1:33 PM   Specimen: In/Out  Cath Urine  Result Value Ref Range Status   Specimen Description   Final    IN/OUT CATH URINE Performed at [facility], [address] [place], [place] [address]    Special Requests   Final    NONE Performed at [facility], [address], [place], [place] [address]    Culture >=100,000 COLONIES/mL KLEBSIELLA ORNITHINOLYTICA (A)  Final   Report Status PENDING  Incomplete     Scheduled Meds:  cholecalciferol  2,000 Units Oral Q1500   enoxaparin (LOVENOX) injection  40 mg Subcutaneous Q24H   escitalopram  20 mg Oral Daily   levothyroxine  75 mcg Oral QAC breakfast   pantoprazole  40 mg Oral BID AC   potassium chloride  20 mEq Oral Once  tamsulosin  0.4 mg Oral QHS   Continuous Infusions:  ceFEPime (MAXIPIME) IV 2 g (02/01/22 1410)   lactated ringers 125 mL/hr at 01/31/22 1603    Procedures/Studies: DG Chest Port 1 View  Result Date: 01/30/2022 CLINICAL DATA:  Chills and fever for several days. EXAM: PORTABLE CHEST 1 VIEW COMPARISON:  Chest radiograph dated 05/12/2021. FINDINGS: The heart size and mediastinal contours are within normal limits. Calcified granulomas are redemonstrated, otherwise both lungs are clear. Degenerative changes are seen in the spine. IMPRESSION: No active disease. Electronically Signed   By: Zerita Boers M.D.   On: 01/30/2022 13:26   CT Renal Stone Study  Result Date: 01/30/2022 CLINICAL DATA:  Flank pain. EXAM: CT ABDOMEN AND PELVIS WITHOUT CONTRAST TECHNIQUE: Multidetector CT imaging of the abdomen and pelvis was performed following the standard protocol without IV contrast. RADIATION DOSE REDUCTION: This exam was performed according to the departmental dose-optimization program which includes automated exposure control, adjustment of the mA and/or kV according to patient size and/or use of iterative reconstruction technique. COMPARISON:  CT abdomen and pelvis 01/18/2007 FINDINGS: Lower chest: There is a new 6 mm nodule in the left lung base. Lung bases  are otherwise clear. There are calcified granulomas in the lung bases. Hepatobiliary: Calcified granulomas are present. There is a 3 cm cyst in the inferior right lobe of the liver which has slightly increased in size compared to the prior study. Gallbladder and bile ducts are within normal limits. Pancreas: Unremarkable. No pancreatic ductal dilatation or surrounding inflammatory changes. Spleen: Calcified granulomas are present. Otherwise within normal limits. Adrenals/Urinary Tract: Foley catheter is seen within the bladder. No urinary tract calculus or hydronephrosis. Adrenal glands within normal limits. Stomach/Bowel: Stomach is within normal limits. Appendix appears normal. No evidence of bowel wall thickening, distention, or inflammatory changes. Appendix is not seen. Small hiatal hernia and duodenal diverticulum again noted. Vascular/Lymphatic: Aortic atherosclerosis. No enlarged abdominal or pelvic lymph nodes. Reproductive: Prostate gland is enlarged and lobulated with nodular protrusion into the bladder base. Prostate measures 7.0 x 7.3 by 5.9 cm and has significantly increased in size. Other: No ascites.  Small fat containing inguinal hernias. Musculoskeletal: No acute or significant osseous findings. IMPRESSION: 1. No evidence for urinary tract calculus or hydronephrosis. 2. Foley catheter in the bladder. 3. Marked enlargement of the prostate gland with nodular protrusion into the bladder base, new from 2008. Recommend clinical correlation and follow-up to exclude neoplasm. 4. Hepatic cyst. 5. Old granulomatous disease. 6. 6 mm left solid pulmonary nodule. Recommend a non-contrast Chest CT at 6-12 months. If patient is high risk for malignancy, recommend an additional non-contrast Chest CT at 18-24 months; if patient is low risk for malignancy a non-contrast Chest CT at 18-24 months is optional. These guidelines do not apply to immunocompromised patients and patients with cancer. Follow up in patients  with significant comorbidities as clinically warranted. For lung cancer screening, adhere to Lung-RADS guidelines. Reference: Radiology. 2017; 284(1):228-43. Electronically Signed   By: Ronney Asters M.D.   On: 01/30/2022 16:15    Orson Eva, DO  [facility]  If 7PM-7AM, please contact night-coverage [email or web address] Password [facility] 02/01/2022, 4:57 PM   LOS: 2 days

## 2022-02-01 NOTE — Progress Notes (Signed)
Patient telemetry monitor has had difficulties picking up in shift.  Staff has been in room several times to adjust, fix monitor.  Patient stated he was tired and would like to sleep at this time.   Patient left alone per request. Will notify daytime after tele monitor.

## 2022-02-02 DIAGNOSIS — R339 Retention of urine, unspecified: Secondary | ICD-10-CM | POA: Insufficient documentation

## 2022-02-02 DIAGNOSIS — N401 Enlarged prostate with lower urinary tract symptoms: Secondary | ICD-10-CM | POA: Diagnosis not present

## 2022-02-02 DIAGNOSIS — N138 Other obstructive and reflux uropathy: Secondary | ICD-10-CM

## 2022-02-02 DIAGNOSIS — E7132 Disorders of ketone metabolism: Secondary | ICD-10-CM | POA: Diagnosis not present

## 2022-02-02 DIAGNOSIS — T83511A Infection and inflammatory reaction due to indwelling urethral catheter, initial encounter: Secondary | ICD-10-CM | POA: Diagnosis not present

## 2022-02-02 DIAGNOSIS — I10 Essential (primary) hypertension: Secondary | ICD-10-CM | POA: Diagnosis not present

## 2022-02-02 DIAGNOSIS — A419 Sepsis, unspecified organism: Secondary | ICD-10-CM | POA: Diagnosis not present

## 2022-02-02 LAB — BASIC METABOLIC PANEL
Anion gap: 5 (ref 5–15)
BUN: 10 mg/dL (ref 8–23)
CO2: 25 mmol/L (ref 22–32)
Calcium: 8.1 mg/dL — ABNORMAL LOW (ref 8.9–10.3)
Chloride: 108 mmol/L (ref 98–111)
Creatinine, Ser: 0.84 mg/dL (ref 0.61–1.24)
GFR, Estimated: >60 mL/min (ref >60)
Glucose, Bld: 98 mg/dL (ref 70–99)
Potassium: 3.3 mmol/L — ABNORMAL LOW (ref 3.5–5.1)
Sodium: 138 mmol/L (ref 135–145)

## 2022-02-02 LAB — URINE CULTURE: Culture: >=100000 — AB

## 2022-02-02 MED ORDER — POTASSIUM CHLORIDE CRYS ER 20 MEQ PO TBCR
40.0000 meq | EXTENDED_RELEASE_TABLET | Freq: Once | ORAL | Status: AC
  Administered 2022-02-02: 40 meq via ORAL
  Filled 2022-02-02: qty 2

## 2022-02-02 MED ORDER — CEFUROXIME AXETIL 500 MG PO TABS: 500.0000 mg | ORAL_TABLET | Freq: Two times a day (BID) | ORAL | 0 refills | Status: DC

## 2022-02-02 NOTE — Progress Notes (Signed)
Nsg Discharge Note  Admit Date:  01/30/2022 Discharge date: 02/02/2022   Micheal Baker to be D/C'd Home per MD order.  AVS completed.  Copy for chart, and copy for patient signed, and dated. Patient/caregiver able to verbalize understanding.  Discharge Medication: Allergies as of 02/02/2022       Reactions   Avelox [moxifloxacin] Other (See Comments)   Caused tendonitis   Penicillins Other (See Comments)   Chills        Medication List     STOP taking these medications    LORazepam 0.5 MG tablet Commonly known as: Ativan   meclizine 25 MG tablet Commonly known as: ANTIVERT   meloxicam 15 MG tablet Commonly known as: MOBIC       TAKE these medications    acetaminophen 500 MG tablet Commonly known as: TYLENOL Take 500-1,000 mg by mouth every 6 (six) hours as needed for mild pain or headache.   ALPHA LIPOIC ACID PO Take 1 capsule by mouth daily.   B-complex with vitamin C tablet Take 1 tablet by mouth in the morning.   cefUROXime 500 MG tablet Commonly known as: CEFTIN Take 1 tablet (500 mg total) by mouth 2 (two) times daily with a meal.   celecoxib 200 MG capsule Commonly known as: CELEBREX Take 200 mg by mouth daily as needed for mild pain.   cyclobenzaprine 10 MG tablet Commonly known as: FLEXERIL Take 10 mg by mouth 3 (three) times daily.   escitalopram 20 MG tablet Commonly known as: LEXAPRO Take 20 mg by mouth daily.   levocetirizine 5 MG tablet Commonly known as: XYZAL Take 5 mg by mouth at bedtime.   levothyroxine 75 MCG tablet Commonly known as: SYNTHROID Take 1 tablet by mouth daily before breakfast.   tamsulosin 0.4 MG Caps capsule Commonly known as: FLOMAX Take 0.4 mg by mouth at bedtime.   vitamin C 100 MG tablet Take 100 mg by mouth daily.   Vitamin D3 50 MCG (2000 UT) Tabs Take 2,000 Units by mouth daily in the afternoon.        Discharge Assessment: Vitals:   02/01/22 2136 02/02/22 0501  BP: (!) 149/81 (!) 143/79   Pulse: 82 76  Resp: 17 18  Temp: 99.4 F (37.4 C) 98.5 F (36.9 C)  SpO2: 98% 98%   Skin clean, dry and intact without evidence of skin break down, no evidence of skin tears noted. IV catheter discontinued intact. Site without signs and symptoms of complications - no redness or edema noted at insertion site, patient denies c/o pain - only slight tenderness at site.  Dressing with slight pressure applied.  D/c Instructions-Education: Discharge instructions given to patient/family with verbalized understanding. D/c education completed with patient/family including follow up instructions, medication list, d/c activities limitations if indicated, with other d/c instructions as indicated by MD - patient able to verbalize understanding, all questions fully answered. Patient instructed to return to ED, call 911, or call MD for any changes in condition.  Patient escorted via [place], and D/C home via private auto.  Clovis Fredrickson, LPN 5/69/7948 01:65 AM

## 2022-02-02 NOTE — Progress Notes (Signed)
Foley removed without complications. Peri care completed.

## 2022-02-02 NOTE — Discharge Summary (Addendum)
Physician Discharge Summary   Patient: Micheal Baker MRN: [id number] DOB: [birth year]  Admit date:     01/30/2022  Discharge date: 02/02/22  Discharge Physician: Murray Hodgkins   PCP: [place]   Recommendations at discharge:   BPH  --CT showed marked enlargement of the prostate gland with nodular protrusion into the bladder base, new from 2008. Recommend  clinical correlation and follow-up to exclude neoplasm. --has follow-up with urology  Pulmonary nodule --6 mm left solid pulmonary nodule. Followed by Dr. Valeta Harms.  Discharge Diagnoses: Principal Problem:   Severe sepsis ([place]) Active Problems:   Essential hypertension   Hypothyroidism   BPH (benign prostatic hyperplasia)   Hypokalemia   Urinary tract infection associated with indwelling urethral catheter ([place])  Resolved Problems:   * No resolved hospital problems. *  Hospital Course: 76 year old man PMH including right lower lobe lung nodule, BPH, recent issue of urinary retention requiring Foley catheter placement in [facility] emergency department with outpatient follow-up with Dr. Abner Greenspan planned for 5/23, however 5/21 developed fever and chills and was admitted for severe sepsis secondary to UTI.  Condition rapidly improved with antibiotics, will complete oral antibiotics on discharge.  Foley catheter removed and voiding well. Has outpatient follow-up with urology.   * Severe sepsis ([facility]) --sepsis resolved; secondary to complicated UTI with foley catheter present; afebrile >24 hours, WBC WNL  Klebsiella UTI --sensitivities noted, home on Ceftin; voiding well.  Urinary tract infection associated with indwelling urethral catheter ([place]) --secondary Klebsiella ornithinolytica, voiding well --has outpatient follow-up with urology this Friday  Hypothyroidism --continue levothyroxine  Hypokalemia --Replete  BPH  --Continue tamsulosin --CT showed marked enlargement of the prostate gland with nodular  protrusion into the bladder base, new from 2008. Recommend  clinical correlation and follow-up to exclude neoplasm. --has follow-up with urology  Pulmonary nodule --6 mm left solid pulmonary nodule. Followed by Dr. Valeta Harms.    Consultants: none Procedures performed: none  Disposition: Home Diet recommendation:  Regular diet DISCHARGE MEDICATION: Allergies as of 02/02/2022       Reactions   Avelox [moxifloxacin] Other (See Comments)   Caused tendonitis   Penicillins Other (See Comments)   Chills        Medication List     STOP taking these medications    LORazepam 0.5 MG tablet Commonly known as: Ativan   meclizine 25 MG tablet Commonly known as: ANTIVERT   meloxicam 15 MG tablet Commonly known as: MOBIC       TAKE these medications    acetaminophen 500 MG tablet Commonly known as: TYLENOL Take 500-1,000 mg by mouth every 6 (six) hours as needed for mild pain or headache.   ALPHA LIPOIC ACID PO Take 1 capsule by mouth daily.   B-complex with vitamin C tablet Take 1 tablet by mouth in the morning.   cefUROXime 500 MG tablet Commonly known as: CEFTIN Take 1 tablet (500 mg total) by mouth 2 (two) times daily with a meal.   celecoxib 200 MG capsule Commonly known as: CELEBREX Take 200 mg by mouth daily as needed for mild pain.   cyclobenzaprine 10 MG tablet Commonly known as: FLEXERIL Take 10 mg by mouth 3 (three) times daily.   escitalopram 20 MG tablet Commonly known as: LEXAPRO Take 20 mg by mouth daily.   levocetirizine 5 MG tablet Commonly known as: XYZAL Take 5 mg by mouth at bedtime.   levothyroxine 75 MCG tablet Commonly known as: SYNTHROID Take 1 tablet by mouth daily before breakfast.  tamsulosin 0.4 MG Caps capsule Commonly known as: FLOMAX Take 0.4 mg by mouth at bedtime.   vitamin C 100 MG tablet Take 100 mg by mouth daily.   Vitamin D3 50 MCG (2000 UT) Tabs Take 2,000 Units by mouth daily in the afternoon.         Follow-up Information     McKenzie, Candee Furbish, MD Follow up on 02/04/2022.   Specialty: Urology Why: 9:40 Contact information: [place] [phone] [phone]                Feels good, voiding well, ready to go home   Discharge Exam: Filed Weights   01/30/22 1234 01/30/22 2028  Weight: 90.7 kg 92.4 kg   Physical Exam Vitals and nursing note reviewed.  Constitutional:      General: He is not in acute distress.    Appearance: He is not ill-appearing or toxic-appearing.  Cardiovascular:     Rate and Rhythm: Normal rate and regular rhythm.     Heart sounds: No murmur heard. Pulmonary:     Effort: Pulmonary effort is normal. No respiratory distress.     Breath sounds: No wheezing, rhonchi or rales.  Neurological:     Mental Status: He is alert.  Psychiatric:        Mood and Affect: Mood normal.        Behavior: Behavior normal.   Condition at discharge: good  The results of significant diagnostics from this hospitalization (including imaging, microbiology, ancillary and laboratory) are listed below for reference.   Imaging Studies: DG Chest Port 1 View  Result Date: 01/30/2022 CLINICAL DATA:  Chills and fever for several days. EXAM: PORTABLE CHEST 1 VIEW COMPARISON:  Chest radiograph dated 05/12/2021. FINDINGS: The heart size and mediastinal contours are within normal limits. Calcified granulomas are redemonstrated, otherwise both lungs are clear. Degenerative changes are seen in the spine. IMPRESSION: No active disease. Electronically Signed   By: Zerita Boers M.D.   On: 01/30/2022 13:26   CT Renal Stone Study  Result Date: 01/30/2022 CLINICAL DATA:  Flank pain. EXAM: CT ABDOMEN AND PELVIS WITHOUT CONTRAST TECHNIQUE: Multidetector CT imaging of the abdomen and pelvis was performed following the standard protocol without IV contrast. RADIATION DOSE REDUCTION: This exam was performed according to the departmental dose-optimization program which  includes automated exposure control, adjustment of the mA and/or kV according to patient size and/or use of iterative reconstruction technique. COMPARISON:  CT abdomen and pelvis 01/18/2007 FINDINGS: Lower chest: There is a new 6 mm nodule in the left lung base. Lung bases are otherwise clear. There are calcified granulomas in the lung bases. Hepatobiliary: Calcified granulomas are present. There is a 3 cm cyst in the inferior right lobe of the liver which has slightly increased in size compared to the prior study. Gallbladder and bile ducts are within normal limits. Pancreas: Unremarkable. No pancreatic ductal dilatation or surrounding inflammatory changes. Spleen: Calcified granulomas are present. Otherwise within normal limits. Adrenals/Urinary Tract: Foley catheter is seen within the bladder. No urinary tract calculus or hydronephrosis. Adrenal glands within normal limits. Stomach/Bowel: Stomach is within normal limits. Appendix appears normal. No evidence of bowel wall thickening, distention, or inflammatory changes. Appendix is not seen. Small hiatal hernia and duodenal diverticulum again noted. Vascular/Lymphatic: Aortic atherosclerosis. No enlarged abdominal or pelvic lymph nodes. Reproductive: Prostate gland is enlarged and lobulated with nodular protrusion into the bladder base. Prostate measures 7.0 x 7.3 by 5.9 cm and has significantly  increased in size. Other: No ascites.  Small fat containing inguinal hernias. Musculoskeletal: No acute or significant osseous findings. IMPRESSION: 1. No evidence for urinary tract calculus or hydronephrosis. 2. Foley catheter in the bladder. 3. Marked enlargement of the prostate gland with nodular protrusion into the bladder base, new from 2008. Recommend clinical correlation and follow-up to exclude neoplasm. 4. Hepatic cyst. 5. Old granulomatous disease. 6. 6 mm left solid pulmonary nodule. Recommend a non-contrast Chest CT at 6-12 months. If patient is high risk for  malignancy, recommend an additional non-contrast Chest CT at 18-24 months; if patient is low risk for malignancy a non-contrast Chest CT at 18-24 months is optional. These guidelines do not apply to immunocompromised patients and patients with cancer. Follow up in patients with significant comorbidities as clinically warranted. For lung cancer screening, adhere to Lung-RADS guidelines. Reference: Radiology. 2017; 284(1):228-43. Electronically Signed   By: Ronney Asters M.D.   On: 01/30/2022 16:15    Microbiology: Results for orders placed or performed during the hospital encounter of 01/30/22  Culture, blood (Routine x 2)     Status: None (Preliminary result)   Collection Time: 01/30/22 12:49 PM   Specimen: BLOOD  Result Value Ref Range Status   Specimen Description BLOOD BLOOD RIGHT ARM  Final   Special Requests   Final    BOTTLES DRAWN AEROBIC AND ANAEROBIC Blood Culture adequate volume   Culture   Final    NO GROWTH 3 DAYS Performed at [facility], [address] [place], [place] [address]    Report Status PENDING  Incomplete  Culture, blood (Routine x 2)     Status: None (Preliminary result)   Collection Time: 01/30/22 12:49 PM   Specimen: BLOOD  Result Value Ref Range Status   Specimen Description BLOOD BLOOD LEFT ARM  Final   Special Requests   Final    BOTTLES DRAWN AEROBIC AND ANAEROBIC Blood Culture adequate volume   Culture   Final    NO GROWTH 3 DAYS Performed at [facility], [address], [place], [place] [address]    Report Status PENDING  Incomplete  Resp Panel by RT-PCR (Flu A&B, Covid) Nasopharyngeal Swab     Status: None   Collection Time: 01/30/22  1:30 PM   Specimen: Nasopharyngeal Swab; Nasopharyngeal(NP) swabs in vial transport medium  Result Value Ref Range Status   SARS Coronavirus 2 by RT PCR NEGATIVE NEGATIVE Final    Comment: (NOTE) SARS-CoV-2 target nucleic acids are NOT DETECTED.  The SARS-CoV-2 RNA is generally detectable in upper  respiratory specimens during the acute phase of infection. The lowest concentration of SARS-CoV-2 viral copies this assay can detect is 138 copies/mL. A negative result does not preclude SARS-Cov-2 infection and should not be used as the sole basis for treatment or other patient management decisions. A negative result may occur with  improper specimen collection/handling, submission of specimen other than nasopharyngeal swab, presence of viral mutation(s) within the areas targeted by this assay, and inadequate number of viral copies(<138 copies/mL). A negative result must be combined with clinical observations, patient history, and epidemiological information. The expected result is Negative.  Fact Sheet for Patients:  EntrepreneurPulse.com.au  Fact Sheet for Healthcare Providers:  IncredibleEmployment.be  This test is no t yet approved or cleared by the [place] FDA and  has been authorized for detection and/or diagnosis of SARS-CoV-2 by FDA under an Emergency Use Authorization (EUA). This EUA will remain  in effect (meaning this test can be used) for  the duration of the COVID-19 declaration under Section 564(b)(1) of the Act, 21 U.S.C.section 360bbb-3(b)(1), unless the authorization is terminated  or revoked sooner.       Influenza A by PCR NEGATIVE NEGATIVE Final   Influenza B by PCR NEGATIVE NEGATIVE Final    Comment: (NOTE) The Xpert Xpress SARS-CoV-2/FLU/RSV plus assay is intended as an aid in the diagnosis of influenza from Nasopharyngeal swab specimens and should not be used as a sole basis for treatment. Nasal washings and aspirates are unacceptable for Xpert Xpress SARS-CoV-2/FLU/RSV testing.  Fact Sheet for Patients: EntrepreneurPulse.com.au  Fact Sheet for Healthcare Providers: IncredibleEmployment.be  This test is not yet approved or cleared by the [place] FDA and has been  authorized for detection and/or diagnosis of SARS-CoV-2 by FDA under an Emergency Use Authorization (EUA). This EUA will remain in effect (meaning this test can be used) for the duration of the COVID-19 declaration under Section 564(b)(1) of the Act, 21 U.S.C. section 360bbb-3(b)(1), unless the authorization is terminated or revoked.  Performed at [facility], [address] [place], [place] [address]   Urine Culture     Status: Abnormal   Collection Time: 01/30/22  1:33 PM   Specimen: In/Out Cath Urine  Result Value Ref Range Status   Specimen Description   Final    IN/OUT CATH URINE Performed at [facility], [address] [place], [place] [address]    Special Requests   Final    NONE Performed at [facility], [address], [place], [place] [address]    Culture >=100,000 COLONIES/mL KLEBSIELLA ORNITHINOLYTICA (A)  Final   Report Status 02/02/2022 FINAL  Final   Organism ID, Bacteria KLEBSIELLA ORNITHINOLYTICA (A)  Final      Susceptibility   Klebsiella ornithinolytica - MIC*    AMPICILLIN RESISTANT Resistant     CEFAZOLIN <=4 SENSITIVE Sensitive     CEFEPIME <=0.12 SENSITIVE Sensitive     CEFTRIAXONE <=0.25 SENSITIVE Sensitive     CIPROFLOXACIN <=0.25 SENSITIVE Sensitive     GENTAMICIN <=1 SENSITIVE Sensitive     IMIPENEM <=0.25 SENSITIVE Sensitive     NITROFURANTOIN 32 SENSITIVE Sensitive     TRIMETH/SULFA <=20 SENSITIVE Sensitive     AMPICILLIN/SULBACTAM <=2 SENSITIVE Sensitive     PIP/TAZO <=4 SENSITIVE Sensitive     * >=100,000 COLONIES/mL KLEBSIELLA ORNITHINOLYTICA    Labs: CBC: Recent Labs  Lab 01/30/22 1249 01/31/22 0454 02/01/22 0458  WBC 11.0* 14.1* 10.5  NEUTROABS 10.3*  --   --   HGB 16.0 12.7* 12.3*  HCT 46.1 36.9* 36.7*  MCV 86.5 87.6 89.5  PLT 248 195 914   Basic Metabolic Panel: Recent Labs  Lab 01/30/22 1249 01/31/22 0454 02/01/22 0458 02/02/22 0417  NA 136 137 137 138  K 3.3* 3.4* 3.4* 3.3*  CL 102 107 109 108  CO2 '25 25 22 25   '$ GLUCOSE 97 114* 95 98  BUN '12 13 11 10  '$ CREATININE 0.86 0.85 0.80 0.84  CALCIUM 9.7 8.4* 8.2* 8.1*  MG  --  1.8 2.0  --    Liver Function Tests: Recent Labs  Lab 01/30/22 1249 02/01/22 0458  AST 33 17  ALT 29 14  ALKPHOS 70 38  BILITOT 1.0 1.0  PROT 8.0 5.3*  ALBUMIN 4.7 2.8*   CBG: Recent Labs  Lab 01/31/22 1114  GLUCAP 96    Discharge time spent: greater than 30 minutes.  Signed: Murray Hodgkins, MD [facility] 02/02/2022

## 2022-02-03 ENCOUNTER — Emergency Department (HOSPITAL_COMMUNITY)
Admission: EM | Admit: 2022-02-03 | Discharge: 2022-02-03 | Disposition: A | Discharge status: Home / Self Care | Payer: Medicare PPO | Attending: Emergency Medicine | Admitting: Emergency Medicine

## 2022-02-03 ENCOUNTER — Encounter (HOSPITAL_COMMUNITY): Payer: Self-pay

## 2022-02-03 ENCOUNTER — Other Ambulatory Visit: Payer: Self-pay

## 2022-02-03 DIAGNOSIS — R338 Other retention of urine: Secondary | ICD-10-CM

## 2022-02-03 LAB — URINALYSIS, ROUTINE W REFLEX MICROSCOPIC
Bilirubin Urine: NEGATIVE
Glucose, UA: NEGATIVE mg/dL
Hgb urine dipstick: NEGATIVE
Ketones, ur: 20 mg/dL — AB
Leukocytes,Ua: NEGATIVE
Nitrite: NEGATIVE
Protein, ur: NEGATIVE mg/dL
Specific Gravity, Urine: 1.005 (ref 1.005–1.030)
pH: 7 (ref 5.0–8.0)

## 2022-02-03 NOTE — Discharge Instructions (Addendum)
Please keep the catheter in place until you see the urologist.

## 2022-02-03 NOTE — ED Notes (Signed)
Bladder scan = 404 mL or urine

## 2022-02-03 NOTE — ED Provider Notes (Signed)
[facility] EMERGENCY DEPARTMENT Provider Note   CSN: [id number] Arrival date & time: 02/02/22  2249     History  Chief Complaint  Patient presents with   Urinary Retention    Micheal Baker is a 76 y.o. male.  The history is provided by the patient.  He has history of hypertension and recent hospitalization for urinary tract infection with sepsis and comes in because of inability to urinate.  He had a Foley catheter in the hospital which was removed this morning at about 8 AM.  He was able to urinate successfully for 4-5 times.  This evening, he had sense that he had to urinate, but could only pass a few drops.  Then he would almost immediately have a sense that he had to urinate again.  He does have history of urinary retention in the past.  Of note, he was discharged on antibiotics and states that he has been taking them today.   Home Medications Prior to Admission medications   Medication Sig Start Date End Date Taking? Authorizing Provider  acetaminophen (TYLENOL) 500 MG tablet Take 500-1,000 mg by mouth every 6 (six) hours as needed for mild pain or headache.    [provider]  ALPHA LIPOIC ACID PO Take 1 capsule by mouth daily.    [provider]  Ascorbic Acid (VITAMIN C) 100 MG tablet Take 100 mg by mouth daily.    [provider]  B Complex-C (B-COMPLEX WITH VITAMIN C) tablet Take 1 tablet by mouth in the morning.    [provider]  cefUROXime (CEFTIN) 500 MG tablet Take 1 tablet (500 mg total) by mouth 2 (two) times daily with a meal. 02/02/22   Samuella Cota, MD  celecoxib (CELEBREX) 200 MG capsule Take 200 mg by mouth daily as needed for mild pain.    [provider]  Cholecalciferol (VITAMIN D3) 50 MCG (2000 UT) TABS Take 2,000 Units by mouth daily in the afternoon.    [provider]  cyclobenzaprine (FLEXERIL) 10 MG tablet Take 10 mg by mouth 3 (three) times daily. 01/14/22   [provider]  escitalopram  (LEXAPRO) 20 MG tablet Take 20 mg by mouth daily.    [provider]  levocetirizine (XYZAL) 5 MG tablet Take 5 mg by mouth at bedtime. 01/17/22   [provider]  levothyroxine (SYNTHROID, LEVOTHROID) 75 MCG tablet Take 1 tablet by mouth daily before breakfast. 03/27/15   [provider]  tamsulosin (FLOMAX) 0.4 MG CAPS capsule Take 0.4 mg by mouth at bedtime.    [provider]      Allergies    Avelox [moxifloxacin] and Penicillins    Review of Systems   Review of Systems  All other systems reviewed and are negative.  Physical Exam Updated Vital Signs BP (!) 144/90 (BP Location: Left Arm)   Pulse 85   Temp 98.1 F (36.7 C) (Oral)   Resp 17   Ht '6\' 5"'$  (1.956 m)   Wt 93 kg   SpO2 95%   BMI 24.31 kg/m  Physical Exam Vitals and nursing note reviewed.  76 year old male, resting comfortably and in no acute distress. Vital signs are significant for borderline elevated blood pressure. Oxygen saturation is 95%, which is normal. Head is normocephalic and atraumatic. PERRLA, EOMI. Oropharynx is clear. Neck is nontender and supple without adenopathy or JVD. Back is nontender and there is no CVA tenderness. Lungs are clear without rales, wheezes, or rhonchi. Chest  is nontender. Heart has regular rate and rhythm without murmur. Abdomen is soft, flat, with mild suprapubic tenderness.  Bladder is not massively distended, will check bladder scan to assess. Extremities have no cyanosis or edema, full range of motion is present. Skin is warm and dry without rash. Neurologic: Mental status is normal, cranial nerves are intact, moves all extremities equally.  ED Results / Procedures / Treatments   Labs (all labs ordered are listed, but only abnormal results are displayed) Labs Reviewed  URINALYSIS, ROUTINE W REFLEX MICROSCOPIC - Abnormal; Notable for the following components:      Result Value   Color, Urine STRAW (*)    Ketones, ur 20 (*)    All other  components within normal limits   Procedures Procedures    Medications Ordered in ED Medications - No data to display  ED Course/ Medical Decision Making/ A&P                           Medical Decision Making Amount and/or Complexity of Data Reviewed Labs: ordered.   Possible urinary retention.  We will need to check bladder scan and will also check urinalysis.  Old records are reviewed showing hospitalization for urinary tract infection with sepsis, Foley removed at 7:51 AM today.  Bladder scan shows greater than 400 mL of urine, Foley catheter is ordered.  Catheter is draining clear yellow urine.  I have interpreted the urinalysis, and it shows no evidence of infection.  Slight ketonuria is present.  He is discharged with Foley catheter in place and referred to urology for for follow-up.  Final Clinical Impression(s) / ED Diagnoses Final diagnoses:  Acute urinary retention    Rx / DC Orders ED Discharge Orders     None         Delora Fuel, MD 68/34/19 [phone]

## 2022-02-03 NOTE — ED Notes (Signed)
Switched to leg bag. Gave standard bag and education. Verbalized education and d/c instructions. Ambulatory to lobby.

## 2022-02-03 NOTE — ED Triage Notes (Signed)
Pt c/o urinary retention. Pt reports he was discharged from hospital this morning. They removed the urinary catheter at 7am. Pt reports he was able to urinate initially approx 5 times before discharge. Pt reports he started having urinary retention tonight at home.

## 2022-02-04 ENCOUNTER — Ambulatory Visit (INDEPENDENT_AMBULATORY_CARE_PROVIDER_SITE_OTHER): Payer: Medicare PPO | Admitting: Urology

## 2022-02-04 ENCOUNTER — Encounter: Payer: Self-pay | Admitting: Urology

## 2022-02-04 VITALS — BP 143/91 | HR 101

## 2022-02-04 DIAGNOSIS — R339 Retention of urine, unspecified: Secondary | ICD-10-CM

## 2022-02-04 DIAGNOSIS — N400 Enlarged prostate without lower urinary tract symptoms: Secondary | ICD-10-CM

## 2022-02-04 MED ORDER — TAMSULOSIN HCL 0.4 MG PO CAPS: 0.4000 mg | ORAL_CAPSULE | Freq: Two times a day (BID) | ORAL | 11 refills | Status: DC

## 2022-02-04 NOTE — Progress Notes (Signed)
02/04/2022 10:10 AM   [facility]ade Nurse Feb 06, 1946 [id number]  Referring provider: Pllc, [place] [address] [place],  [place] [address]  Urinary retention   HPI: Micheal Baker is a 76yo here for evaluation of of urinary retention. 12 days ago he was admitted for sepsis from a UTI. He had foley placed at that time. Foley was removed prior to discharge on 5/21. He then went to the ER last night and had the foley replaced. He has been on flomax for over 1 year. IPSS 25 QOL 2. Ct stone study shows prostate 205cc.     PMH: Past Medical History:  Diagnosis Date   Acute medial meniscus tear of left knee    Depression    Frequent PVCs    Hypertension    Hypothyroidism    NSVT (nonsustained ventricular tachycardia) (HCC)    Osteoarthritis    Thyroid disease     Surgical History: Past Surgical History:  Procedure Laterality Date   CATARACT EXTRACTION     left   COLONOSCOPY N/A 04/08/2016   Procedure: COLONOSCOPY;  Surgeon: Danie Binder, MD;  [facility];  Service: Endoscopy;  Laterality: N/A;  11:15 AM   EYE SURGERY     left-scar tissue   groin surgery  2010   growth removed    KNEE ARTHROSCOPY     left   KNEE ARTHROSCOPY WITH LATERAL MENISECTOMY Right 03/11/2015   Procedure: RIGHT KNEE ARTHROSCOPY WITH MEDIALAND LATERAL MENISECTOMY;  Surgeon: Elsie Saas, MD;  Location: [place];  Service: Orthopedics;  Laterality: Right;   KNEE ARTHROSCOPY WITH MEDIAL MENISECTOMY Left 06/27/2014   Procedure: LEFT KNEE ARTHROSCOPY WITH PARTIAL MEDIAL AND LATERAL MENISECTOMIES AND CHONDROPLASTY;  Surgeon: Lorn Junes, MD;  Location: [place];  Service: Orthopedics;  Laterality: Left;   KNEE ARTHROSCOPY WITH MEDIAL MENISECTOMY Right 03/11/2015   Procedure: KNEE ARTHROSCOPY WITH MEDIAL MENISECTOMY;  Surgeon: Elsie Saas, MD;  Location: [place];  Service: Orthopedics;  Laterality: Right;   LEFT HEART  CATHETERIZATION WITH CORONARY ANGIOGRAM N/A 09/14/2011   Procedure: LEFT HEART CATHETERIZATION WITH CORONARY ANGIOGRAM;  Surgeon: Pixie Casino, MD;  Location: [facility];  Service: Cardiovascular;  Laterality: N/A;   POLYPECTOMY  04/08/2016   Procedure: POLYPECTOMY;  Surgeon: Danie Binder, MD;  [facility];  Service: Endoscopy;;  colon    RETINAL DETACHMENT SURGERY Left 2002   Dr. Merlene Morse    Home Medications:  Allergies as of 02/04/2022       Reactions   Avelox [moxifloxacin] Other (See Comments)   Caused tendonitis   Penicillins Other (See Comments)   Chills        Medication List        Accurate as of Feb 04, 2022 10:10 AM. If you have any questions, ask your nurse or doctor.          acetaminophen 500 MG tablet Commonly known as: TYLENOL Take 500-1,000 mg by mouth every 6 (six) hours as needed for mild pain or headache.   ALPHA LIPOIC ACID PO Take 1 capsule by mouth daily.   B-complex with vitamin C tablet Take 1 tablet by mouth in the morning.   cefUROXime 500 MG tablet Commonly known as: CEFTIN Take 1 tablet (500 mg total) by mouth 2 (two) times daily with a meal.   celecoxib 200 MG capsule Commonly known as: CELEBREX Take 200 mg by mouth daily as needed for mild pain.  cyclobenzaprine 10 MG tablet Commonly known as: FLEXERIL Take 10 mg by mouth 3 (three) times daily.   escitalopram 20 MG tablet Commonly known as: LEXAPRO Take 20 mg by mouth daily.   levocetirizine 5 MG tablet Commonly known as: XYZAL Take 5 mg by mouth at bedtime.   levothyroxine 75 MCG tablet Commonly known as: SYNTHROID Take 1 tablet by mouth daily before breakfast.   tamsulosin 0.4 MG Caps capsule Commonly known as: FLOMAX Take 0.4 mg by mouth at bedtime.   vitamin C 100 MG tablet Take 100 mg by mouth daily.   Vitamin D3 50 MCG (2000 UT) Tabs Take 2,000 Units by mouth daily in the afternoon.        Allergies:  Allergies  Allergen Reactions    Avelox [Moxifloxacin] Other (See Comments)    Caused tendonitis   Penicillins Other (See Comments)    Chills     Family History: Family History  Problem Relation Age of Onset   Hypertension Mother    Hypertension Father    Heart attack Father    Diabetes Father    Asthma Daughter     Social History:  reports that he has never smoked. He has never used smokeless tobacco. He reports current alcohol use. He reports that he does not use drugs.  ROS: All other review of systems were reviewed and are negative except what is noted above in HPI  Physical Exam: BP (!) 143/91   Pulse (!) 101   Constitutional:  Alert and oriented, No acute distress. HEENT: [place] AT, moist mucus membranes.  Trachea midline, no masses. Cardiovascular: No clubbing, cyanosis, or edema. Respiratory: Normal respiratory effort, no increased work of breathing. GI: Abdomen is soft, nontender, nondistended, no abdominal masses GU: No CVA tenderness. Circumcised phallus. No masses/lesions on penis, testis, scrotum. Prostate 40g smooth no nodules no induration.  Lymph: No cervical or inguinal lymphadenopathy. Skin: No rashes, bruises or suspicious lesions. Neurologic: Grossly intact, no focal deficits, moving all 4 extremities. Psychiatric: Normal mood and affect.  Laboratory Data: Lab Results  Component Value Date   WBC 10.5 02/01/2022   HGB 12.3 (L) 02/01/2022   HCT 36.7 (L) 02/01/2022   MCV 89.5 02/01/2022   PLT 153 02/01/2022    Lab Results  Component Value Date   CREATININE 0.84 02/02/2022    No results found for: PSA  No results found for: TESTOSTERONE  No results found for: HGBA1C  Urinalysis    Component Value Date/Time   COLORURINE STRAW (A) 02/03/2022 0036   APPEARANCEUR CLEAR 02/03/2022 0036   LABSPEC 1.005 02/03/2022 0036   PHURINE 7.0 02/03/2022 0036   GLUCOSEU NEGATIVE 02/03/2022 0036   HGBUR NEGATIVE 02/03/2022 0036   BILIRUBINUR NEGATIVE 02/03/2022 0036   KETONESUR 20 (A)  02/03/2022 0036   PROTEINUR NEGATIVE 02/03/2022 0036   UROBILINOGEN 0.2 08/08/2011 0800   NITRITE NEGATIVE 02/03/2022 0036   LEUKOCYTESUR NEGATIVE 02/03/2022 0036    Lab Results  Component Value Date   BACTERIA NONE SEEN 01/30/2022    Pertinent Imaging: CT stone 01/30/2022: Images reviewed and discussed with the patient  No results found for this or any previous visit.  No results found for this or any previous visit.  No results found for this or any previous visit.  No results found for this or any previous visit.  No results found for this or any previous visit.  No results found for this or any previous visit.  No results found for this or any previous visit.  Results for orders placed during the hospital encounter of 01/30/22  CT Renal Stone Study  Narrative CLINICAL DATA:  Flank pain.  EXAM: CT ABDOMEN AND PELVIS WITHOUT CONTRAST  TECHNIQUE: Multidetector CT imaging of the abdomen and pelvis was performed following the standard protocol without IV contrast.  RADIATION DOSE REDUCTION: This exam was performed according to the departmental dose-optimization program which includes automated exposure control, adjustment of the mA and/or kV according to patient size and/or use of iterative reconstruction technique.  COMPARISON:  CT abdomen and pelvis 01/18/2007  FINDINGS: Lower chest: There is a new 6 mm nodule in the left lung base. Lung bases are otherwise clear. There are calcified granulomas in the lung bases.  Hepatobiliary: Calcified granulomas are present. There is a 3 cm cyst in the inferior right lobe of the liver which has slightly increased in size compared to the prior study. Gallbladder and bile ducts are within normal limits.  Pancreas: Unremarkable. No pancreatic ductal dilatation or surrounding inflammatory changes.  Spleen: Calcified granulomas are present. Otherwise within normal limits.  Adrenals/Urinary Tract: Foley catheter is seen  within the bladder. No urinary tract calculus or hydronephrosis. Adrenal glands within normal limits.  Stomach/Bowel: Stomach is within normal limits. Appendix appears normal. No evidence of bowel wall thickening, distention, or inflammatory changes. Appendix is not seen. Small hiatal hernia and duodenal diverticulum again noted.  Vascular/Lymphatic: Aortic atherosclerosis. No enlarged abdominal or pelvic lymph nodes.  Reproductive: Prostate gland is enlarged and lobulated with nodular protrusion into the bladder base. Prostate measures 7.0 x 7.3 by 5.9 cm and has significantly increased in size.  Other: No ascites.  Small fat containing inguinal hernias.  Musculoskeletal: No acute or significant osseous findings.  IMPRESSION: 1. No evidence for urinary tract calculus or hydronephrosis. 2. Foley catheter in the bladder. 3. Marked enlargement of the prostate gland with nodular protrusion into the bladder base, new from 2008. Recommend clinical correlation and follow-up to exclude neoplasm. 4. Hepatic cyst. 5. Old granulomatous disease. 6. 6 mm left solid pulmonary nodule. Recommend a non-contrast Chest CT at 6-12 months. If patient is high risk for malignancy, recommend an additional non-contrast Chest CT at 18-24 months; if patient is low risk for malignancy a non-contrast Chest CT at 18-24 months is optional. These guidelines do not apply to immunocompromised patients and patients with cancer. Follow up in patients with significant comorbidities as clinically warranted. For lung cancer screening, adhere to Lung-RADS guidelines. Reference: Radiology. 2017; 284(1):228-43.   Electronically Signed By: Ronney Asters M.D. On: 01/30/2022 16:15   Assessment & Plan:    1. Benign prostatic hyperplasia, unspecified whether lower urinary tract symptoms present -Increase flomax to BID  2. Urinary retention -Increase flomax to BID. If this fails to improve his retention then we  will likely proceed with simple prostatectomy   No follow-ups on file.  Nicolette Bang, MD  [facility] [place]

## 2022-02-04 NOTE — Patient Instructions (Signed)

## 2022-02-05 LAB — CULTURE, BLOOD (ROUTINE X 2)
Culture: NO GROWTH
Culture: NO GROWTH
Special Requests: ADEQUATE
Special Requests: ADEQUATE

## 2022-02-09 ENCOUNTER — Ambulatory Visit (HOSPITAL_COMMUNITY)
Admission: RE | Admit: 2022-02-09 | Discharge: 2022-02-09 | Disposition: A | Discharge status: Home / Self Care | Payer: Medicare PPO | Source: Ambulatory Visit | Attending: Family Medicine | Admitting: Family Medicine

## 2022-02-09 DIAGNOSIS — R208 Other disturbances of skin sensation: Secondary | ICD-10-CM | POA: Diagnosis not present

## 2022-02-09 DIAGNOSIS — L539 Erythematous condition, unspecified: Secondary | ICD-10-CM | POA: Diagnosis not present

## 2022-02-11 ENCOUNTER — Ambulatory Visit: Payer: Medicare PPO | Admitting: Urology

## 2022-02-11 ENCOUNTER — Encounter: Payer: Self-pay | Admitting: Urology

## 2022-02-11 VITALS — BP 139/89 | HR 91

## 2022-02-11 DIAGNOSIS — R339 Retention of urine, unspecified: Secondary | ICD-10-CM | POA: Diagnosis not present

## 2022-02-11 NOTE — Progress Notes (Signed)
Fill and Pull Catheter Removal  Patient is present today for a catheter removal.  Patient was cleaned and prepped in a sterile fashion 242m of sterile water/ saline was instilled into the bladder when the patient felt the urge to urinate. 162mof water was then drained from the balloon.  A 16FR coude foley cath was removed from the bladder no complications were noted .  Patient as then given some time to void on their own.  Patient can void  [id number] their own after some time.  Patient tolerated well.  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

## 2022-02-11 NOTE — Progress Notes (Signed)
02/11/2022 8:49 AM   [facility] Nurse Apr 30, 1946 [id number]  Referring provider: Pllc, [place] [address] [place],  [place] [address]  Followup urinary retention   HPI: Micheal Baker is a 76yo here for followup for urinary retention. Voiding trial passed today. He is on flomax 0.'4mg'$  BID. No other complaints today   PMH: Past Medical History:  Diagnosis Date   Acute medial meniscus tear of left knee    Depression    Frequent PVCs    Hypertension    Hypothyroidism    NSVT (nonsustained ventricular tachycardia) (HCC)    Osteoarthritis    Thyroid disease     Surgical History: Past Surgical History:  Procedure Laterality Date   CATARACT EXTRACTION     left   COLONOSCOPY N/A 04/08/2016   Procedure: COLONOSCOPY;  Surgeon: Danie Binder, MD;  [facility];  Service: Endoscopy;  Laterality: N/A;  11:15 AM   EYE SURGERY     left-scar tissue   groin surgery  2010   growth removed    KNEE ARTHROSCOPY     left   KNEE ARTHROSCOPY WITH LATERAL MENISECTOMY Right 03/11/2015   Procedure: RIGHT KNEE ARTHROSCOPY WITH MEDIALAND LATERAL MENISECTOMY;  Surgeon: Elsie Saas, MD;  Location: [place];  Service: Orthopedics;  Laterality: Right;   KNEE ARTHROSCOPY WITH MEDIAL MENISECTOMY Left 06/27/2014   Procedure: LEFT KNEE ARTHROSCOPY WITH PARTIAL MEDIAL AND LATERAL MENISECTOMIES AND CHONDROPLASTY;  Surgeon: Lorn Junes, MD;  Location: [place];  Service: Orthopedics;  Laterality: Left;   KNEE ARTHROSCOPY WITH MEDIAL MENISECTOMY Right 03/11/2015   Procedure: KNEE ARTHROSCOPY WITH MEDIAL MENISECTOMY;  Surgeon: Elsie Saas, MD;  Location: [place];  Service: Orthopedics;  Laterality: Right;   LEFT HEART CATHETERIZATION WITH CORONARY ANGIOGRAM N/A 09/14/2011   Procedure: LEFT HEART CATHETERIZATION WITH CORONARY ANGIOGRAM;  Surgeon: Pixie Casino, MD;  Location: [facility] CATH LAB;  Service: Cardiovascular;   Laterality: N/A;   POLYPECTOMY  04/08/2016   Procedure: POLYPECTOMY;  Surgeon: Danie Binder, MD;  [facility];  Service: Endoscopy;;  colon    RETINAL DETACHMENT SURGERY Left 2002   Dr. Merlene Morse    Home Medications:  Allergies as of 02/11/2022       Reactions   Avelox [moxifloxacin] Other (See Comments)   Caused tendonitis   Penicillins Other (See Comments)   Chills        Medication List        Accurate as of February 11, 2022  8:49 AM. If you have any questions, ask your nurse or doctor.          acetaminophen 500 MG tablet Commonly known as: TYLENOL Take 500-1,000 mg by mouth every 6 (six) hours as needed for mild pain or headache.   ALPHA LIPOIC ACID PO Take 1 capsule by mouth daily.   B-complex with vitamin C tablet Take 1 tablet by mouth in the morning.   cefUROXime 500 MG tablet Commonly known as: CEFTIN Take 1 tablet (500 mg total) by mouth 2 (two) times daily with a meal.   celecoxib 200 MG capsule Commonly known as: CELEBREX Take 200 mg by mouth daily as needed for mild pain.   cyclobenzaprine 10 MG tablet Commonly known as: FLEXERIL Take 10 mg by mouth 3 (three) times daily.   escitalopram 20 MG tablet Commonly known as: LEXAPRO Take 20 mg by mouth daily.   levocetirizine 5 MG tablet Commonly known as: XYZAL  Take 5 mg by mouth at bedtime.   levothyroxine 75 MCG tablet Commonly known as: SYNTHROID Take 1 tablet by mouth daily before breakfast.   tamsulosin 0.4 MG Caps capsule Commonly known as: FLOMAX Take 1 capsule (0.4 mg total) by mouth in the morning and at bedtime.   vitamin C 100 MG tablet Take 100 mg by mouth daily.   Vitamin D3 50 MCG (2000 UT) Tabs Take 2,000 Units by mouth daily in the afternoon.        Allergies:  Allergies  Allergen Reactions   Avelox [Moxifloxacin] Other (See Comments)    Caused tendonitis   Penicillins Other (See Comments)    Chills     Family History: Family History  Problem  Relation Age of Onset   Hypertension Mother    Hypertension Father    Heart attack Father    Diabetes Father    Asthma Daughter     Social History:  reports that he has never smoked. He has never used smokeless tobacco. He reports current alcohol use. He reports that he does not use drugs.  ROS: All other review of systems were reviewed and are negative except what is noted above in HPI  Physical Exam: BP 139/89   Pulse 91   Constitutional:  Alert and oriented, No acute distress. HEENT: [place] AT, moist mucus membranes.  Trachea midline, no masses. Cardiovascular: No clubbing, cyanosis, or edema. Respiratory: Normal respiratory effort, no increased work of breathing. GI: Abdomen is soft, nontender, nondistended, no abdominal masses GU: No CVA tenderness.  Lymph: No cervical or inguinal lymphadenopathy. Skin: No rashes, bruises or suspicious lesions. Neurologic: Grossly intact, no focal deficits, moving all 4 extremities. Psychiatric: Normal mood and affect.  Laboratory Data: Lab Results  Component Value Date   WBC 10.5 02/01/2022   HGB 12.3 (L) 02/01/2022   HCT 36.7 (L) 02/01/2022   MCV 89.5 02/01/2022   PLT 153 02/01/2022    Lab Results  Component Value Date   CREATININE 0.84 02/02/2022    No results found for: PSA  No results found for: TESTOSTERONE  No results found for: HGBA1C  Urinalysis    Component Value Date/Time   COLORURINE STRAW (A) 02/03/2022 0036   APPEARANCEUR CLEAR 02/03/2022 0036   LABSPEC 1.005 02/03/2022 0036   PHURINE 7.0 02/03/2022 0036   GLUCOSEU NEGATIVE 02/03/2022 0036   HGBUR NEGATIVE 02/03/2022 0036   BILIRUBINUR NEGATIVE 02/03/2022 0036   KETONESUR 20 (A) 02/03/2022 0036   PROTEINUR NEGATIVE 02/03/2022 0036   UROBILINOGEN 0.2 08/08/2011 0800   NITRITE NEGATIVE 02/03/2022 0036   LEUKOCYTESUR NEGATIVE 02/03/2022 0036    Lab Results  Component Value Date   BACTERIA NONE SEEN 01/30/2022    Pertinent Imaging:  No results found  for this or any previous visit.  No results found for this or any previous visit.  No results found for this or any previous visit.  No results found for this or any previous visit.  No results found for this or any previous visit.  No results found for this or any previous visit.  No results found for this or any previous visit.  Results for orders placed during the hospital encounter of 01/30/22  CT Renal Stone Study  Narrative CLINICAL DATA:  Flank pain.  EXAM: CT ABDOMEN AND PELVIS WITHOUT CONTRAST  TECHNIQUE: Multidetector CT imaging of the abdomen and pelvis was performed following the standard protocol without IV contrast.  RADIATION DOSE REDUCTION: This exam was performed according to the departmental dose-optimization  program which includes automated exposure control, adjustment of the mA and/or kV according to patient size and/or use of iterative reconstruction technique.  COMPARISON:  CT abdomen and pelvis 01/18/2007  FINDINGS: Lower chest: There is a new 6 mm nodule in the left lung base. Lung bases are otherwise clear. There are calcified granulomas in the lung bases.  Hepatobiliary: Calcified granulomas are present. There is a 3 cm cyst in the inferior right lobe of the liver which has slightly increased in size compared to the prior study. Gallbladder and bile ducts are within normal limits.  Pancreas: Unremarkable. No pancreatic ductal dilatation or surrounding inflammatory changes.  Spleen: Calcified granulomas are present. Otherwise within normal limits.  Adrenals/Urinary Tract: Foley catheter is seen within the bladder. No urinary tract calculus or hydronephrosis. Adrenal glands within normal limits.  Stomach/Bowel: Stomach is within normal limits. Appendix appears normal. No evidence of bowel wall thickening, distention, or inflammatory changes. Appendix is not seen. Small hiatal hernia and duodenal diverticulum again  noted.  Vascular/Lymphatic: Aortic atherosclerosis. No enlarged abdominal or pelvic lymph nodes.  Reproductive: Prostate gland is enlarged and lobulated with nodular protrusion into the bladder base. Prostate measures 7.0 x 7.3 by 5.9 cm and has significantly increased in size.  Other: No ascites.  Small fat containing inguinal hernias.  Musculoskeletal: No acute or significant osseous findings.  IMPRESSION: 1. No evidence for urinary tract calculus or hydronephrosis. 2. Foley catheter in the bladder. 3. Marked enlargement of the prostate gland with nodular protrusion into the bladder base, new from 2008. Recommend clinical correlation and follow-up to exclude neoplasm. 4. Hepatic cyst. 5. Old granulomatous disease. 6. 6 mm left solid pulmonary nodule. Recommend a non-contrast Chest CT at 6-12 months. If patient is high risk for malignancy, recommend an additional non-contrast Chest CT at 18-24 months; if patient is low risk for malignancy a non-contrast Chest CT at 18-24 months is optional. These guidelines do not apply to immunocompromised patients and patients with cancer. Follow up in patients with significant comorbidities as clinically warranted. For lung cancer screening, adhere to Lung-RADS guidelines. Reference: Radiology. 2017; 284(1):228-43.   Electronically Signed By: Ronney Asters M.D. On: 01/30/2022 16:15   Assessment & Plan:    1. Urinary retention -Continue flomax BID. RTC 1 month for a PVR   No follow-ups on file.  Nicolette Bang, MD  [facility] [place]

## 2022-02-11 NOTE — Patient Instructions (Addendum)
Benign Prostatic Hyperplasia  Benign prostatic hyperplasia (BPH) is an enlarged prostate gland that is caused by the normal aging process. The prostate may get bigger as a man gets older. The condition is not caused by cancer. The prostate is a walnut-sized gland that is involved in the production of semen. It is located in front of the rectum and below the bladder. The bladder stores urine. The urethra carries stored urine out of the body. An enlarged prostate can press on the urethra. This can make it harder to pass urine. The buildup of urine in the bladder can cause infection. Back pressure and infection may progress to bladder damage and kidney (renal) failure. What are the causes? This condition is part of the normal aging process. However, not all men develop problems from this condition. If the prostate enlarges away from the urethra, urine flow will not be blocked. If it enlarges toward the urethra and compresses it, there will be problems passing urine. What increases the risk? This condition is more likely to develop in men older than 50 years. What are the signs or symptoms? Symptoms of this condition include: Getting up often during the night to urinate. Needing to urinate frequently during the day. Difficulty starting urine flow. Decrease in size and strength of your urine stream. Leaking (dribbling) after urinating. Inability to pass urine. This needs immediate treatment. Inability to completely empty your bladder. Pain when you pass urine. This is more common if there is also an infection. Urinary tract infection (UTI). How is this diagnosed? This condition is diagnosed based on your medical history, a physical exam, and your symptoms. Tests will also be done, such as: A post-void bladder scan. This measures any amount of urine that may remain in your bladder after you finish urinating. A digital rectal exam. In a rectal exam, your health care provider checks your prostate by  putting a lubricated, gloved finger into your rectum to feel the back of your prostate gland. This exam detects the size of your gland and any abnormal lumps or growths. An exam of your urine (urinalysis). A prostate specific antigen (PSA) screening. This is a blood test used to screen for prostate cancer. An ultrasound. This test uses sound waves to electronically produce a picture of your prostate gland. Your health care provider may refer you to a specialist in kidney and prostate diseases (urologist). How is this treated? Once symptoms begin, your health care provider will monitor your condition (active surveillance or watchful waiting). Treatment for this condition will depend on the severity of your condition. Treatment may include: Observation and yearly exams. This may be the only treatment needed if your condition and symptoms are mild. Medicines to relieve your symptoms, including: Medicines to shrink the prostate. Medicines to relax the muscle of the prostate. Surgery in severe cases. Surgery may include: Prostatectomy. In this procedure, the prostate tissue is removed completely through an open incision or with a laparoscope or robotics. Transurethral resection of the prostate (TURP). In this procedure, a tool is inserted through the opening at the tip of the penis (urethra). It is used to cut away tissue of the inner core of the prostate. The pieces are removed through the same opening of the penis. This removes the blockage. Transurethral incision (TUIP). In this procedure, small cuts are made in the prostate. This lessens the prostate's pressure on the urethra. Transurethral microwave thermotherapy (TUMT). This procedure uses microwaves to create heat. The heat destroys and removes a small amount of   prostate tissue. Transurethral needle ablation (TUNA). This procedure uses radio frequencies to destroy and remove a small amount of prostate tissue. Interstitial laser coagulation ([place]).  This procedure uses a laser to destroy and remove a small amount of prostate tissue. Transurethral electrovaporization (TUVP). This procedure uses electrodes to destroy and remove a small amount of prostate tissue. Prostatic urethral lift. This procedure inserts an implant to push the lobes of the prostate away from the urethra. Follow these instructions at home: Take over-the-counter and prescription medicines only as told by your health care provider. Monitor your symptoms for any changes. Contact your health care provider with any changes. Avoid drinking large amounts of liquid before going to bed or out in public. Avoid or reduce how much caffeine or alcohol you drink. Give yourself time when you urinate. Keep all follow-up visits. This is important. Contact a health care provider if: You have unexplained back pain. Your symptoms do not get better with treatment. You develop side effects from the medicine you are taking. Your urine becomes very dark or has a bad smell. Your lower abdomen becomes distended and you have trouble passing urine. Get help right away if: You have a fever or chills. You suddenly cannot urinate. You feel light-headed or very dizzy, or you faint. There are large amounts of blood or clots in your urine. Your urinary problems become hard to manage. You develop moderate to severe low back or flank pain. The flank is the side of your body between the ribs and the hip. These symptoms may be an emergency. Get help right away. Call 911. Do not wait to see if the symptoms will go away. Do not drive yourself to the hospital. Summary Benign prostatic hyperplasia (BPH) is an enlarged prostate that is caused by the normal aging process. It is not caused by cancer. An enlarged prostate can press on the urethra. This can make it hard to pass urine. This condition is more likely to develop in men older than 50 years. Get help right away if you suddenly cannot urinate. This  information is not intended to replace advice given to you by your health care provider. Make sure you discuss any questions you have with your health care provider. Document Revised: 03/17/2021 Document Reviewed: 03/17/2021 Elsevier Patient Education  [place].      Step 1 Get all of your supplies ready and place near you. Step 2 Wash your hands, or put on gloves. Step 3 Wash around the tip of your penis with warm antibacterial soapy water. Step 4 Take catheter out of package and drain the lubricant over toilet. Step 5 While holding the penis at a 45 degree angle from the stomach in one hand and the catheter in the other hand  Step 6 Insert the catheter slowly into your urethra. If there is resistance when the catheter reaches the sphincter muscle,              take a deep breath and gently apply steady pressure.              DO NOT FORCE THE CATHETER Step 7 When the urine begins to flow insert another inch and lower penis. Allow the urine to flow into the toilet. Step 8 When the flow of urine stops, slowly remove the catheter.

## 2022-02-14 ENCOUNTER — Ambulatory Visit
Admission: EM | Admit: 2022-02-14 | Discharge: 2022-02-14 | Disposition: A | Discharge status: Home / Self Care | Payer: Medicare PPO | Attending: Nurse Practitioner | Admitting: Nurse Practitioner

## 2022-02-14 DIAGNOSIS — S20461A Insect bite (nonvenomous) of right back wall of thorax, initial encounter: Secondary | ICD-10-CM | POA: Diagnosis not present

## 2022-02-14 DIAGNOSIS — W57XXXA Bitten or stung by nonvenomous insect and other nonvenomous arthropods, initial encounter: Secondary | ICD-10-CM

## 2022-02-14 MED ORDER — DOXYCYCLINE HYCLATE 100 MG PO CAPS: 200.0000 mg | ORAL_CAPSULE | Freq: Once | ORAL | 0 refills | Status: AC

## 2022-02-14 NOTE — ED Triage Notes (Signed)
Pt presents with tick bite to back that is red and irritated, was recently dc from hospital for sepsis, unsure if still feeling unwell or coming from bite

## 2022-02-14 NOTE — ED Provider Notes (Signed)
[facility]    CSN: [id number] Arrival date & time: 02/14/22  6295      History   Chief Complaint Chief Complaint  Patient presents with   Insect Bite    HPI Micheal Baker is a 76 y.o. male.   Patient presents with tick bite to his right back; tick was removed Saturday afternoon.  Patient is concerned because he was recently discharged from the hospital for severe sepsis secondary to acute urinary retention and BPH.  He reports not feeling like himself, although was told his symptoms will take a while to get all of the way better.  He saw his urologist on Friday and had his Foley catheter removed.  He reports since the catheter was removed, he has been taking Flomax twice daily per his urologist's instructions and has been urinating much more frequently.  He denies any fevers, body aches or chills.  Denies any change in the color or odor of his urine.  He does have some discomfort with urination, however this has not changed over the weekend since the catheter was removed.  He denies change in appetite, change in activity level, or fatigue.  Patient reports a tick bite is red, denies any bull's-eye rash, streaking, headache, vision changes, neck stiffness, new body aches or joint pain.   Past Medical History:  Diagnosis Date   Acute medial meniscus tear of left knee    Depression    Frequent PVCs    Hypertension    Hypothyroidism    NSVT (nonsustained ventricular tachycardia) ([place])    Osteoarthritis    Thyroid disease     Patient Active Problem List   Diagnosis Date Noted   Urinary tract infection associated with indwelling urethral catheter ([place])    Severe sepsis ([place]) 01/30/2022   BPH (benign prostatic hyperplasia) 01/30/2022   Hypokalemia 01/30/2022   Acute lateral meniscus tear of left knee 06/27/2014   Acute medial meniscus tear of left knee    NSVT (nonsustained ventricular tachycardia) ([place])    Bradycardia 06/10/2014   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   Cough  10/03/2012   Chest pain 08/08/2011   Essential hypertension 08/08/2011   Hypothyroidism 08/08/2011    Past Surgical History:  Procedure Laterality Date   CATARACT EXTRACTION     left   COLONOSCOPY N/A 04/08/2016   Procedure: COLONOSCOPY;  Surgeon: Danie Binder, MD;  [facility];  Service: Endoscopy;  Laterality: N/A;  11:15 AM   EYE SURGERY     left-scar tissue   groin surgery  2010   growth removed    KNEE ARTHROSCOPY     left   KNEE ARTHROSCOPY WITH LATERAL MENISECTOMY Right 03/11/2015   Procedure: RIGHT KNEE ARTHROSCOPY WITH MEDIALAND LATERAL MENISECTOMY;  Surgeon: Elsie Saas, MD;  Location: [place];  Service: Orthopedics;  Laterality: Right;   KNEE ARTHROSCOPY WITH MEDIAL MENISECTOMY Left 06/27/2014   Procedure: LEFT KNEE ARTHROSCOPY WITH PARTIAL MEDIAL AND LATERAL MENISECTOMIES AND CHONDROPLASTY;  Surgeon: Lorn Junes, MD;  Location: [place];  Service: Orthopedics;  Laterality: Left;   KNEE ARTHROSCOPY WITH MEDIAL MENISECTOMY Right 03/11/2015   Procedure: KNEE ARTHROSCOPY WITH MEDIAL MENISECTOMY;  Surgeon: Elsie Saas, MD;  Location: [place];  Service: Orthopedics;  Laterality: Right;   LEFT HEART CATHETERIZATION WITH CORONARY ANGIOGRAM N/A 09/14/2011   Procedure: LEFT HEART CATHETERIZATION WITH CORONARY ANGIOGRAM;  Surgeon: Pixie Casino, MD;  [facility];  Service: Cardiovascular;  Laterality: N/A;  POLYPECTOMY  04/08/2016   Procedure: POLYPECTOMY;  Surgeon: Danie Binder, MD;  [facility];  Service: Endoscopy;;  colon    RETINAL DETACHMENT SURGERY Left 2002   Dr. Merlene Morse       Home Medications    Prior to Admission medications   Medication Sig Start Date End Date Taking? Authorizing Provider  doxycycline (VIBRAMYCIN) 100 MG capsule Take 2 capsules (200 mg total) by mouth once for 1 dose. 02/14/22 02/14/22 Yes Eulogio Bear, NP  acetaminophen (TYLENOL) 500 MG tablet  Take 500-1,000 mg by mouth every 6 (six) hours as needed for mild pain or headache.    [provider]  ALPHA LIPOIC ACID PO Take 1 capsule by mouth daily.    [provider]  Ascorbic Acid (VITAMIN C) 100 MG tablet Take 100 mg by mouth daily.    [provider]  B Complex-C (B-COMPLEX WITH VITAMIN C) tablet Take 1 tablet by mouth in the morning.    [provider]  cefUROXime (CEFTIN) 500 MG tablet Take 1 tablet (500 mg total) by mouth 2 (two) times daily with a meal. Patient not taking: Reported on 02/04/2022 02/02/22   Samuella Cota, MD  celecoxib (CELEBREX) 200 MG capsule Take 200 mg by mouth daily as needed for mild pain. Patient not taking: Reported on 02/04/2022    [provider]  Cholecalciferol (VITAMIN D3) 50 MCG (2000 UT) TABS Take 2,000 Units by mouth daily in the afternoon. Patient not taking: Reported on 02/04/2022    [provider]  cyclobenzaprine (FLEXERIL) 10 MG tablet Take 10 mg by mouth 3 (three) times daily. 01/14/22   [provider]  escitalopram (LEXAPRO) 20 MG tablet Take 20 mg by mouth daily.    [provider]  levocetirizine (XYZAL) 5 MG tablet Take 5 mg by mouth at bedtime. 01/17/22   [provider]  levothyroxine (SYNTHROID, LEVOTHROID) 75 MCG tablet Take 1 tablet by mouth daily before breakfast. 03/27/15   [provider]  tamsulosin (FLOMAX) 0.4 MG CAPS capsule Take 1 capsule (0.4 mg total) by mouth in the morning and at bedtime. 02/04/22   McKenzie, Candee Furbish, MD    Family History Family History  Problem Relation Age of Onset   Hypertension Mother    Hypertension Father    Heart attack Father    Diabetes Father    Asthma Daughter     Social History Social History   Tobacco Use   Smoking status: Never   Smokeless tobacco: Never   Tobacco comments:    06/10/14 1968- Tried it once and didn't like it- AJ  Vaping Use   Vaping Use: Never used  Substance Use Topics    Alcohol use: Yes    Alcohol/week: 0.0 standard drinks    Comment: occasionally   Drug use: No     Allergies   Avelox [moxifloxacin] and Penicillins   Review of Systems Review of Systems Per HPI  Physical Exam Triage Vital Signs ED Triage Vitals  Enc Vitals Group     BP 02/14/22 1020 (!) 149/87     Pulse Rate 02/14/22 1020 68     Resp 02/14/22 1020 20     Temp 02/14/22 1020 98.2 F (36.8 C)     Temp src --      SpO2 02/14/22 1020 97 %     Weight --      Height --      Head Circumference --  Peak Flow --      Pain Score 02/14/22 1019 0     Pain Loc --      Pain Edu? --      Excl. in [place]? --    No data found.  Updated Vital Signs BP (!) 149/87   Pulse 68   Temp 98.2 F (36.8 C)   Resp 20   SpO2 97%   Visual Acuity Right Eye Distance:   Left Eye Distance:   Bilateral Distance:    Right Eye Near:   Left Eye Near:    Bilateral Near:     Physical Exam Vitals and nursing note reviewed.  Constitutional:      General: He is not in acute distress.    Appearance: Normal appearance. He is not toxic-appearing.  HENT:     Head: Normocephalic and atraumatic.  Pulmonary:     Effort: Pulmonary effort is normal. No respiratory distress.  Musculoskeletal:     Cervical back: Normal range of motion and neck supple. No rigidity.  Lymphadenopathy:     Cervical: No cervical adenopathy.  Skin:    General: Skin is warm and dry.     Capillary Refill: Capillary refill takes less than 2 seconds.     Coloration: Skin is not jaundiced or pale.     Findings: Erythema present.          Comments: Firm, erythematous area approximately 2 cm x 2 cm in area where tick was reportedly removed.  There is no central clearing, no bull's-eye rash, no fluctuance, drainage, or warmth.  Neurological:     Mental Status: He is alert and oriented to person, place, and time.     Motor: No weakness.     Gait: Gait normal.  Psychiatric:        Behavior: Behavior is cooperative.      UC Treatments / Results  Labs (all labs ordered are listed, but only abnormal results are displayed) Labs Reviewed - No data to display  EKG   Radiology No results found.  Procedures Procedures (including critical care time)  Medications Ordered in UC Medications - No data to display  Initial Impression / Assessment and Plan / UC Course  I have reviewed the triage vital signs and the nursing notes.  Pertinent labs & imaging results that were available during my care of the patient were reviewed by me and considered in my medical decision making (see chart for details).    We will treat prophylactically with doxycycline 200 mg once given the tick was removed less than 72 hours ago.  Patient's vital signs are stable today and he is not having any symptoms of acute urinary retention or UTI.  Encouraged hydration with plenty of water; following all discharge instructions and taking all medications as prescribed.  I encouraged him to seek care if symptoms worsen despite treatment or if he has recurrent symptoms of urinary retention.  The patient was given the opportunity to ask questions.  All questions answered to their satisfaction.  The patient is in agreement to this plan.   Final Clinical Impressions(s) / UC Diagnoses   Final diagnoses:  Tick bite of right back wall of thorax, initial encounter     Discharge Instructions      - There is no bulls-eye rash around the tick bite today and does look like the tick has been completely removed - Please take the prophylaxis treatment doxycycline 200 mg one time to help prevent tick borne illness -  Please seek care if you develop new symptoms including fever, chills, new joint or muscle pain, headache, neck stiffness, or if you are not acting normally    ED Prescriptions     Medication Sig Dispense Auth. Provider   doxycycline (VIBRAMYCIN) 100 MG capsule Take 2 capsules (200 mg total) by mouth once for 1 dose. 2 capsule  Eulogio Bear, NP      PDMP not reviewed this encounter.   Eulogio Bear, NP 02/14/22 1043

## 2022-02-14 NOTE — Discharge Instructions (Addendum)
-   There is no bulls-eye rash around the tick bite today and does look like the tick has been completely removed - Please take the prophylaxis treatment doxycycline 200 mg one time to help prevent tick borne illness - Please seek care if you develop new symptoms including fever, chills, new joint or muscle pain, headache, neck stiffness, or if you are not acting normally

## 2022-02-15 ENCOUNTER — Telehealth: Payer: Self-pay | Admitting: Physician Assistant

## 2022-02-15 ENCOUNTER — Ambulatory Visit (HOSPITAL_COMMUNITY)
Admission: RE | Admit: 2022-02-15 | Discharge: 2022-02-15 | Disposition: A | Discharge status: Home / Self Care | Payer: Medicare PPO | Source: Ambulatory Visit | Attending: Physician Assistant | Admitting: Physician Assistant

## 2022-02-15 ENCOUNTER — Ambulatory Visit: Payer: Medicare PPO | Admitting: Physician Assistant

## 2022-02-15 VITALS — BP 161/82 | HR 89 | Ht 77.0 in | Wt 205.0 lb

## 2022-02-15 DIAGNOSIS — K4090 Unilateral inguinal hernia, without obstruction or gangrene, not specified as recurrent: Secondary | ICD-10-CM | POA: Diagnosis not present

## 2022-02-15 DIAGNOSIS — N451 Epididymitis: Secondary | ICD-10-CM

## 2022-02-15 DIAGNOSIS — Z8719 Personal history of other diseases of the digestive system: Secondary | ICD-10-CM | POA: Diagnosis not present

## 2022-02-15 DIAGNOSIS — N5082 Scrotal pain: Secondary | ICD-10-CM | POA: Diagnosis not present

## 2022-02-15 DIAGNOSIS — N442 Benign cyst of testis: Secondary | ICD-10-CM | POA: Diagnosis not present

## 2022-02-15 DIAGNOSIS — N50811 Right testicular pain: Secondary | ICD-10-CM

## 2022-02-15 DIAGNOSIS — N433 Hydrocele, unspecified: Secondary | ICD-10-CM | POA: Diagnosis not present

## 2022-02-15 DIAGNOSIS — R339 Retention of urine, unspecified: Secondary | ICD-10-CM

## 2022-02-15 LAB — URINALYSIS, ROUTINE W REFLEX MICROSCOPIC
Bilirubin, UA: NEGATIVE
Glucose, UA: NEGATIVE
Nitrite, UA: NEGATIVE
Specific Gravity, UA: 1.015 (ref 1.005–1.030)
Urobilinogen, Ur: 0.2 mg/dL (ref 0.2–1.0)
pH, UA: 6 (ref 5.0–7.5)

## 2022-02-15 LAB — MICROSCOPIC EXAMINATION
Bacteria, UA: NONE SEEN
Renal Epithel, UA: NONE SEEN /hpf

## 2022-02-15 LAB — BLADDER SCAN AMB NON-IMAGING: Scan Result: 3

## 2022-02-15 MED ORDER — SULFAMETHOXAZOLE-TRIMETHOPRIM 800-160 MG PO TABS: 1.0000 | ORAL_TABLET | Freq: Two times a day (BID) | ORAL | 0 refills | Status: DC

## 2022-02-15 NOTE — Telephone Encounter (Signed)
-----   Message from Dorisann Frames, RN sent at 02/15/2022  5:25 PM EDT -----  ----- Message ----- From: Reynaldo Minium, PA-C Sent: 02/15/2022   4:48 PM EDT To: [facility] [place]  Please let pt know his [place] does show infection and inflammation of the epididymis which I told him in the office today. No hernia contents in the scrotum. I sent Rx for antibx to his pharm for tx of the epididymitis that he needs to start today. Avoid heavy lifting and strenuous activities for a few days until antibx have time to calm infection. The Celebrex he has should also help with discomfort. ----- Message ----- From: Interface, Rad Results In Sent: 02/15/2022   3:36 PM EDT To: Reynaldo Minium, PA-C

## 2022-02-15 NOTE — Progress Notes (Unsigned)
post void residual =3mL 

## 2022-02-15 NOTE — Telephone Encounter (Signed)
Pt informed of results and tx recommendations. Will call if he has other concerns or if sxs persist/worsen/return.

## 2022-02-15 NOTE — Progress Notes (Signed)
Assessment: 1. Pain in right testicle - Urinalysis, Routine w reflex microscopic - BLADDER SCAN AMB NON-IMAGING - US SCROTUM W/DOPPLER  2. Epididymitis, right  3. Scrotal pain  4. Unilateral inguinal hernia without obstruction or gangrene, recurrence not specified    Plan: Scrotal ultrasound and Doppler study ordered urgently.  Report indicates epididymitis on the right without evidence of torsion.  Bactrim DS twice daily for 10 days prescribed to cover epididymitis.  Patient is advised to continue current prescription of Celebrex for discomfort.  Follow-up in 1 month to assess resolution of symptoms, sooner if symptoms worsen or recur.  Follow-up with Dr. Lovell Sheehan with general surgery for further evaluation of his hernia as discussed.  Chief Complaint: No chief complaint on file.   HPI: Micheal Baker is a 76 y.o. male with history of urosepsis and urinary retention who presents for  evaluation of sudden onset right-sided scrotal pain and swelling with pain radiating into the groin.  No urinary complaints.  He denies trauma, history of scrotal symptoms in the past, no history of hernia.  No fever, chills, nausea or vomiting. At his visit last week, the patient had a successful voiding trial and was taught self-catheterization.  He has not been able to self cath since that visit, but states he is voiding, but with some difficulty. Patient was seen in the emergency department yesterday for a tick bite and treated prophylactically for tickborne illness t with 200 mg of doxycycline.  02/11/22 Voiding trial passed  02/04/22 Micheal Baker is a 76yo here for evaluation of of urinary retention. 12 days ago he was admitted for sepsis from a UTI. He had foley placed at that time. Foley was removed prior to discharge on 5/21. He then went to the ER last night and had the foley replaced. He has been on flomax for over 1 year. IPSS 25 QOL 2. Ct stone study shows prostate 205cc.    Portions of the above  documentation were copied from a prior visit for review purposes only.  Allergies: Allergies  Allergen Reactions   Avelox [Moxifloxacin] Other (See Comments)    Caused tendonitis   Penicillins Other (See Comments)    Chills     PMH: Past Medical History:  Diagnosis Date   Acute medial meniscus tear of left knee    Depression    Frequent PVCs    Hypertension    Hypothyroidism    NSVT (nonsustained ventricular tachycardia) (HCC)    Osteoarthritis    Thyroid disease     PSH: Past Surgical History:  Procedure Laterality Date   CATARACT EXTRACTION     left   COLONOSCOPY N/A 04/08/2016   Procedure: COLONOSCOPY;  Surgeon: West Bali, MD;  [facility];  Service: Endoscopy;  Laterality: N/A;  11:15 AM   EYE SURGERY     left-scar tissue   groin surgery  2010   growth removed    KNEE ARTHROSCOPY     left   KNEE ARTHROSCOPY WITH LATERAL MENISECTOMY Right 03/11/2015   Procedure: RIGHT KNEE ARTHROSCOPY WITH MEDIALAND LATERAL MENISECTOMY;  Surgeon: Salvatore Marvel, MD;  Location: [facility];  Service: Orthopedics;  Laterality: Right;   KNEE ARTHROSCOPY WITH MEDIAL MENISECTOMY Left 06/27/2014   Procedure: LEFT KNEE ARTHROSCOPY WITH PARTIAL MEDIAL AND LATERAL MENISECTOMIES AND CHONDROPLASTY;  Surgeon: Nilda Simmer, MD;  Location: [facility];  Service: Orthopedics;  Laterality: Left;   KNEE ARTHROSCOPY WITH MEDIAL MENISECTOMY Right 03/11/2015   Procedure: KNEE  ARTHROSCOPY WITH MEDIAL MENISECTOMY;  Surgeon: Salvatore Marvel, MD;  Location: [facility];  Service: Orthopedics;  Laterality: Right;   LEFT HEART CATHETERIZATION WITH CORONARY ANGIOGRAM N/A 09/14/2011   Procedure: LEFT HEART CATHETERIZATION WITH CORONARY ANGIOGRAM;  Surgeon: [name], MD;  Location: [facility] C[facility];  Service: Cardiovascular;  Laterality: N/A;   POLYPECTOMY  04/08/2016   Procedure: POLYPECTOMY;  Surgeon: West Bali, MD;  [facility]: [facility];   Service: Endoscopy;;  colon    RETINAL DETACHMENT SURGERY Left 2002   Dr. Hermina Barters    SH: Social History   Tobacco Use   Smoking status: Never   Smokeless tobacco: Never   Tobacco comments:    06/10/14 1968- Tried it once and didn't like it- AJ  Vaping Use   Vaping Use: Never used  Substance Use Topics   Alcohol use: Yes    Alcohol/week: 0.0 standard drinks    Comment: occasionally   Drug use: No    ROS: See HPI  PE: BP (!) 161/82   Pulse 89   Ht 6\' 5"  (1.956 m)   Wt 205 lb (93 kg)   BMI 24.31 kg/m  GENERAL APPEARANCE:  Well appearing, well developed, well nourished, NAD HEENT:  Atraumatic, normocephalic NECK:  Supple. Trachea midline ABDOMEN:  Soft, non-tender, no masses GU: Abdomen nontender. R inguinal hernia appreciated and found to be reducible.  Patient was exquisitely tender to palpation along the entire inguinal canal.  Scrotum somewhat edematous on the right.  Left testicle nontender.  Right testicle moderately tender over the epididymis without skin changes, erythema, induration, fluctuance.  Patient had a difficult time tolerating exam secondary to pain EXTREMITIES:  Moves all extremities well NEUROLOGIC:  Alert and oriented x 3, normal gait MENTAL STATUS:  appropriate BACK:  Non-tender to palpation, No CVAT SKIN:  Warm, dry, and intact  Results: Laboratory Data: Lab Results  Component Value Date   WBC 10.5 02/01/2022   HGB 12.3 (L) 02/01/2022   HCT 36.7 (L) 02/01/2022   MCV 89.5 02/01/2022   PLT 153 02/01/2022    Lab Results  Component Value Date   CREATININE 0.84 02/02/2022    Urinalysis    Component Value Date/Time   COLORURINE STRAW (A) 02/03/2022 0036   APPEARANCEUR CLEAR 02/03/2022 0036   LABSPEC 1.005 02/03/2022 0036   PHURINE 7.0 02/03/2022 0036   GLUCOSEU NEGATIVE 02/03/2022 0036   HGBUR NEGATIVE 02/03/2022 0036   BILIRUBINUR NEGATIVE 02/03/2022 0036   KETONESUR 20 (A) 02/03/2022 0036   PROTEINUR NEGATIVE 02/03/2022 0036    UROBILINOGEN 0.2 08/08/2011 0800   NITRITE NEGATIVE 02/03/2022 0036   LEUKOCYTESUR NEGATIVE 02/03/2022 0036    Lab Results  Component Value Date   BACTERIA NONE SEEN 01/30/2022    Pertinent Imaging:  No results found for this or any previous visit.  No results found for this or any previous visit.  No results found for this or any previous visit.  No results found for this or any previous visit.  No results found for this or any previous visit.  No results found for this or any previous visit.  No results found for this or any previous visit.  Results for orders placed during the hospital encounter of 01/30/22  CT Renal Stone Study  Narrative CLINICAL DATA:  Flank pain.  EXAM: CT ABDOMEN AND PELVIS WITHOUT CONTRAST  TECHNIQUE: Multidetector CT imaging of the abdomen and pelvis was performed following the standard protocol without IV contrast.  RADIATION DOSE REDUCTION: This  exam was performed according to the departmental dose-optimization program which includes automated exposure control, adjustment of the mA and/or kV according to patient size and/or use of iterative reconstruction technique.  COMPARISON:  CT abdomen and pelvis 01/18/2007  FINDINGS: Lower chest: There is a new 6 mm nodule in the left lung base. Lung bases are otherwise clear. There are calcified granulomas in the lung bases.  Hepatobiliary: Calcified granulomas are present. There is a 3 cm cyst in the inferior right lobe of the liver which has slightly increased in size compared to the prior study. Gallbladder and bile ducts are within normal limits.  Pancreas: Unremarkable. No pancreatic ductal dilatation or surrounding inflammatory changes.  Spleen: Calcified granulomas are present. Otherwise within normal limits.  Adrenals/Urinary Tract: Foley catheter is seen within the bladder. No urinary tract calculus or hydronephrosis. Adrenal glands within normal limits.  Stomach/Bowel:  Stomach is within normal limits. Appendix appears normal. No evidence of bowel wall thickening, distention, or inflammatory changes. Appendix is not seen. Small hiatal hernia and duodenal diverticulum again noted.  Vascular/Lymphatic: Aortic atherosclerosis. No enlarged abdominal or pelvic lymph nodes.  Reproductive: Prostate gland is enlarged and lobulated with nodular protrusion into the bladder base. Prostate measures 7.0 x 7.3 by 5.9 cm and has significantly increased in size.  Other: No ascites.  Small fat containing inguinal hernias.  Musculoskeletal: No acute or significant osseous findings.  IMPRESSION: 1. No evidence for urinary tract calculus or hydronephrosis. 2. Foley catheter in the bladder. 3. Marked enlargement of the prostate gland with nodular protrusion into the bladder base, new from 2008. Recommend clinical correlation and follow-up to exclude neoplasm. 4. Hepatic cyst. 5. Old granulomatous disease. 6. 6 mm left solid pulmonary nodule. Recommend a non-contrast Chest CT at 6-12 months. If patient is high risk for malignancy, recommend an additional non-contrast Chest CT at 18-24 months; if patient is low risk for malignancy a non-contrast Chest CT at 18-24 months is optional. These guidelines do not apply to immunocompromised patients and patients with cancer. Follow up in patients with significant comorbidities as clinically warranted. For lung cancer screening, adhere to Lung-RADS guidelines. Reference: Radiology. 2017; 284(1):228-43.   Electronically Signed By: Darliss Cheney M.D. On: 01/30/2022 16:15  No results found for this or any previous visit (from the past 24 hour(s)).

## 2022-03-07 ENCOUNTER — Telehealth: Payer: Self-pay

## 2022-03-08 ENCOUNTER — Ambulatory Visit: Payer: Medicare PPO | Admitting: Physician Assistant

## 2022-03-08 VITALS — BP 143/78 | HR 76 | Ht 77.0 in | Wt 205.0 lb

## 2022-03-08 DIAGNOSIS — N138 Other obstructive and reflux uropathy: Secondary | ICD-10-CM | POA: Diagnosis not present

## 2022-03-08 DIAGNOSIS — R339 Retention of urine, unspecified: Secondary | ICD-10-CM

## 2022-03-08 DIAGNOSIS — N50811 Right testicular pain: Secondary | ICD-10-CM | POA: Diagnosis not present

## 2022-03-08 DIAGNOSIS — N401 Enlarged prostate with lower urinary tract symptoms: Secondary | ICD-10-CM | POA: Diagnosis not present

## 2022-03-08 DIAGNOSIS — N451 Epididymitis: Secondary | ICD-10-CM

## 2022-03-08 DIAGNOSIS — R972 Elevated prostate specific antigen [PSA]: Secondary | ICD-10-CM

## 2022-03-08 DIAGNOSIS — N442 Benign cyst of testis: Secondary | ICD-10-CM | POA: Diagnosis not present

## 2022-03-08 MED ORDER — DOXYCYCLINE HYCLATE 100 MG PO CAPS: 100.0000 mg | ORAL_CAPSULE | Freq: Two times a day (BID) | ORAL | 0 refills | Status: DC

## 2022-03-08 NOTE — Progress Notes (Signed)
Assessment: 1. Testicular cyst - US SCROTUM W/DOPPLER; Future  2. Elevated PSA - PSA, total and free; Future  3. Pain in right testicle  4. Epididymitis, right  5. Urinary retention  6. Benign prostatic hyperplasia with urinary obstruction    Plan: Doxy for 10 days, FU scrotal US for eval of cyst, PSA in 3 weeks with OV Dr. Alyson Ingles as previously scheduled.  Patient is advised to follow-up with Dr. Arnoldo Morale should the inguinal hernia become symptomatic again.  He is reassured that there is no evidence of retention today.  Chief Complaint: No chief complaint on file.   HPI: Micheal Baker is a 76 y.o. male who presents for continued evaluation of testicular cyst noted on [place] and scrotal pain with h/o epididymitis.  He has completed recent treatment for epididymitis on the right and states symptoms have improved significantly, but he is noticed a tender mass has recurred on the right and he wishes to have it evaluated.  Symptoms of pain are much less severe than on previous visit.  He is also due for PSA.  He has minimal LUTS with IPSS of 11.  Continues to have some urinary frequency and voids 2-3 times per night.  Quality-of-life = 2 He is tolerating Flomax well for LUTS without feeling of incomplete emptying that he had when he was in retention.  Inguinal hernia previously noted on exam is no longer bothering him and he has not followed up with Dr. Arnoldo Morale in the surgery office as he is currently asymptomatic. UA = 0-5 WBCs, no bacteria, nitrite negative  03/08/22 DECARLO RIVET is a 76 y.o. male with history of urosepsis and urinary retention who presents for  evaluation of sudden onset right-sided scrotal pain and swelling with pain radiating into the groin.  No urinary complaints.  He denies trauma, history of scrotal symptoms in the past, no history of hernia.  No fever, chills, nausea or vomiting. At his visit last week, the patient had a successful voiding trial and was taught  self-catheterization.  He has not been able to self cath since that visit, but states he is voiding, but with some difficulty. Patient was seen in the emergency department yesterday for a tick bite and treated prophylactically for tickborne illness t with 200 mg of doxycycline.  Portions of the above documentation were copied from a prior visit for review purposes only.  Allergies: Allergies  Allergen Reactions   Avelox [Moxifloxacin] Other (See Comments)    Caused tendonitis   Penicillins Other (See Comments)    Chills     PMH: Past Medical History:  Diagnosis Date   Acute medial meniscus tear of left knee    Depression    Frequent PVCs    Hypertension    Hypothyroidism    NSVT (nonsustained ventricular tachycardia) (HCC)    Osteoarthritis    Thyroid disease     PSH: Past Surgical History:  Procedure Laterality Date   CATARACT EXTRACTION     left   COLONOSCOPY N/A 04/08/2016   Procedure: COLONOSCOPY;  Surgeon: Danie Binder, MD;  [facility];  Service: Endoscopy;  Laterality: N/A;  11:15 AM   EYE SURGERY     left-scar tissue   groin surgery  2010   growth removed    KNEE ARTHROSCOPY     left   KNEE ARTHROSCOPY WITH LATERAL MENISECTOMY Right 03/11/2015   Procedure: RIGHT KNEE ARTHROSCOPY WITH MEDIALAND LATERAL MENISECTOMY;  Surgeon: Elsie Saas, MD;  Location: [facility];  Service: Orthopedics;  Laterality: Right;   KNEE ARTHROSCOPY WITH MEDIAL MENISECTOMY Left 06/27/2014   Procedure: LEFT KNEE ARTHROSCOPY WITH PARTIAL MEDIAL AND LATERAL MENISECTOMIES AND CHONDROPLASTY;  Surgeon: Lorn Junes, MD;  Location: [place];  Service: Orthopedics;  Laterality: Left;   KNEE ARTHROSCOPY WITH MEDIAL MENISECTOMY Right 03/11/2015   Procedure: KNEE ARTHROSCOPY WITH MEDIAL MENISECTOMY;  Surgeon: Elsie Saas, MD;  Location: [facility];  Service: Orthopedics;  Laterality: Right;   LEFT HEART CATHETERIZATION WITH CORONARY  ANGIOGRAM N/A 09/14/2011   Procedure: LEFT HEART CATHETERIZATION WITH CORONARY ANGIOGRAM;  Surgeon: Pixie Casino, MD;  Location: [facility];  Service: Cardiovascular;  Laterality: N/A;   POLYPECTOMY  04/08/2016   Procedure: POLYPECTOMY;  Surgeon: Danie Binder, MD;  [facility];  Service: Endoscopy;;  colon    RETINAL DETACHMENT SURGERY Left 2002   Dr. Merlene Morse    SH: Social History   Tobacco Use   Smoking status: Never   Smokeless tobacco: Never   Tobacco comments:    06/10/14 1968- Tried it once and didn't like it- AJ  Vaping Use   Vaping Use: Never used  Substance Use Topics   Alcohol use: Yes    Alcohol/week: 0.0 standard drinks of alcohol    Comment: occasionally   Drug use: No    ROS: All other review of systems were reviewed and are negative except what is noted above in HPI  PE: BP (!) 143/78   Pulse 76   Ht '6\' 5"'$  (1.956 m)   Wt 205 lb (93 kg)   BMI 24.31 kg/m  GENERAL APPEARANCE:  Well appearing, well developed, well nourished, NAD HEENT:  Atraumatic, normocephalic NECK:  Supple. Trachea midline ABDOMEN:  Soft, non-tender, no masses GU: Right inguinal hernia previously noted is nontender with no need for further reduction.  Left testicle unremarkable without tenderness or abnormal masses, right testicle reveals mildly tender epididymis.  No scrotal swelling, redness appreciated EXTREMITIES:  Moves all extremities well, without clubbing, cyanosis, or edema NEUROLOGIC:  Alert and oriented x 3, normal gait, CN II-XII grossly intact MENTAL STATUS:  appropriate BACK:  Non-tender to palpation, No CVAT SKIN:  Warm, dry, and intact   Results: Laboratory Data: Lab Results  Component Value Date   WBC 10.5 02/01/2022   HGB 12.3 (L) 02/01/2022   HCT 36.7 (L) 02/01/2022   MCV 89.5 02/01/2022   PLT 153 02/01/2022    Lab Results  Component Value Date   CREATININE 0.84 02/02/2022    No results found for: "PSA"  No results found for:  "TESTOSTERONE"  No results found for: "HGBA1C"  Urinalysis    Component Value Date/Time   COLORURINE STRAW (A) 02/03/2022 0036   APPEARANCEUR Clear 02/15/2022 1329   LABSPEC 1.005 02/03/2022 0036   PHURINE 7.0 02/03/2022 0036   GLUCOSEU Negative 02/15/2022 1329   HGBUR NEGATIVE 02/03/2022 0036   BILIRUBINUR Negative 02/15/2022 1329   KETONESUR 20 (A) 02/03/2022 0036   PROTEINUR 1+ (A) 02/15/2022 1329   PROTEINUR NEGATIVE 02/03/2022 0036   UROBILINOGEN 0.2 08/08/2011 0800   NITRITE Negative 02/15/2022 1329   NITRITE NEGATIVE 02/03/2022 0036   LEUKOCYTESUR Trace (A) 02/15/2022 1329   LEUKOCYTESUR NEGATIVE 02/03/2022 0036    Lab Results  Component Value Date   LABMICR See below: 02/15/2022   WBCUA 6-10 (A) 02/15/2022   LABEPIT 0-10 02/15/2022   MUCUS Present 02/15/2022   BACTERIA None seen 02/15/2022    Pertinent Imaging:  No results found for this or any previous visit.  No results found for this or any previous visit.  No results found for this or any previous visit.  No results found for this or any previous visit.  No results found for this or any previous visit.  No results found for this or any previous visit.  No results found for this or any previous visit.  Results for orders placed during the hospital encounter of 01/30/22  CT Renal Stone Study  Narrative CLINICAL DATA:  Flank pain.  EXAM: CT ABDOMEN AND PELVIS WITHOUT CONTRAST  TECHNIQUE: Multidetector CT imaging of the abdomen and pelvis was performed following the standard protocol without IV contrast.  RADIATION DOSE REDUCTION: This exam was performed according to the departmental dose-optimization program which includes automated exposure control, adjustment of the mA and/or kV according to patient size and/or use of iterative reconstruction technique.  COMPARISON:  CT abdomen and pelvis 01/18/2007  FINDINGS: Lower chest: There is a new 6 mm nodule in the left lung base. Lung bases are  otherwise clear. There are calcified granulomas in the lung bases.  Hepatobiliary: Calcified granulomas are present. There is a 3 cm cyst in the inferior right lobe of the liver which has slightly increased in size compared to the prior study. Gallbladder and bile ducts are within normal limits.  Pancreas: Unremarkable. No pancreatic ductal dilatation or surrounding inflammatory changes.  Spleen: Calcified granulomas are present. Otherwise within normal limits.  Adrenals/Urinary Tract: Foley catheter is seen within the bladder. No urinary tract calculus or hydronephrosis. Adrenal glands within normal limits.  Stomach/Bowel: Stomach is within normal limits. Appendix appears normal. No evidence of bowel wall thickening, distention, or inflammatory changes. Appendix is not seen. Small hiatal hernia and duodenal diverticulum again noted.  Vascular/Lymphatic: Aortic atherosclerosis. No enlarged abdominal or pelvic lymph nodes.  Reproductive: Prostate gland is enlarged and lobulated with nodular protrusion into the bladder base. Prostate measures 7.0 x 7.3 by 5.9 cm and has significantly increased in size.  Other: No ascites.  Small fat containing inguinal hernias.  Musculoskeletal: No acute or significant osseous findings.  IMPRESSION: 1. No evidence for urinary tract calculus or hydronephrosis. 2. Foley catheter in the bladder. 3. Marked enlargement of the prostate gland with nodular protrusion into the bladder base, new from 2008. Recommend clinical correlation and follow-up to exclude neoplasm. 4. Hepatic cyst. 5. Old granulomatous disease. 6. 6 mm left solid pulmonary nodule. Recommend a non-contrast Chest CT at 6-12 months. If patient is high risk for malignancy, recommend an additional non-contrast Chest CT at 18-24 months; if patient is low risk for malignancy a non-contrast Chest CT at 18-24 months is optional. These guidelines do not apply to immunocompromised  patients and patients with cancer. Follow up in patients with significant comorbidities as clinically warranted. For lung cancer screening, adhere to Lung-RADS guidelines. Reference: Radiology. 2017; 284(1):228-43.   Electronically Signed By: Ronney Asters M.D. On: 01/30/2022 16:15  No results found for this or any previous visit (from the past 24 hour(s)).

## 2022-03-16 ENCOUNTER — Ambulatory Visit: Payer: Medicare PPO | Admitting: Physician Assistant

## 2022-03-23 DIAGNOSIS — R972 Elevated prostate specific antigen [PSA]: Secondary | ICD-10-CM | POA: Diagnosis not present

## 2022-03-23 DIAGNOSIS — F418 Other specified anxiety disorders: Secondary | ICD-10-CM | POA: Diagnosis not present

## 2022-03-23 DIAGNOSIS — Z1331 Encounter for screening for depression: Secondary | ICD-10-CM | POA: Diagnosis not present

## 2022-03-23 DIAGNOSIS — E559 Vitamin D deficiency, unspecified: Secondary | ICD-10-CM | POA: Diagnosis not present

## 2022-03-23 DIAGNOSIS — Z0001 Encounter for general adult medical examination with abnormal findings: Secondary | ICD-10-CM | POA: Diagnosis not present

## 2022-03-23 DIAGNOSIS — G4730 Sleep apnea, unspecified: Secondary | ICD-10-CM | POA: Diagnosis not present

## 2022-03-23 DIAGNOSIS — N400 Enlarged prostate without lower urinary tract symptoms: Secondary | ICD-10-CM | POA: Diagnosis not present

## 2022-03-23 DIAGNOSIS — R5383 Other fatigue: Secondary | ICD-10-CM | POA: Diagnosis not present

## 2022-03-23 DIAGNOSIS — Z6825 Body mass index (BMI) 25.0-25.9, adult: Secondary | ICD-10-CM | POA: Diagnosis not present

## 2022-03-25 ENCOUNTER — Other Ambulatory Visit: Payer: Medicare PPO

## 2022-03-25 ENCOUNTER — Telehealth: Payer: Self-pay

## 2022-03-25 DIAGNOSIS — R972 Elevated prostate specific antigen [PSA]: Secondary | ICD-10-CM

## 2022-03-25 NOTE — Telephone Encounter (Signed)
Left a voice message 03-24-22.  Has infection that will affect his labs for today.  Please advise.  Call back:   [phone]  Thanks, Helene Kelp

## 2022-03-25 NOTE — Telephone Encounter (Signed)
Patient has been seen previously for infections, still having pain in right testicle that comes and goes.  Patient is asking if is ok to continue with the PSA  labs today or if he needs antibiotics first and to r/s his lab.

## 2022-03-26 LAB — PSA, TOTAL AND FREE
PSA, Free Pct: 18.4 %
PSA, Free: 1.82 ng/mL
Prostate Specific Ag, Serum: 9.9 ng/mL — ABNORMAL HIGH (ref 0.0–4.0)

## 2022-03-31 NOTE — Progress Notes (Addendum)
[facility] Note  04/06/2022     CHIEF COMPLAINT Patient presents for Retina Follow Up  HISTORY OF PRESENT ILLNESS: Micheal Baker is a 76 y.o. male who presents to the clinic today for:  No issues reported with vision HPI     Retina Follow Up   Patient presents with  Retinal Break/Detachment.  In both eyes.  Since onset it is stable.  I, the attending physician,  performed the HPI with the patient and updated documentation appropriately.        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   In May pt had sepsis and was hospitalized.        Last edited by Bernarda Caffey, MD on 04/06/2022  2:06 PM.    Patient states that he had a UTI and sepsis. He has prostate issues. Patient is taking Flomax.  Referring physician: [facility] [address],  [place] [address]  HISTORICAL INFORMATION:   Selected notes from the MEDICAL RECORD NUMBER     CURRENT MEDICATIONS: No current outpatient medications on file. (Ophthalmic Drugs)   No current facility-administered medications for this visit. (Ophthalmic Drugs)   Current Outpatient Medications (Other)  Medication Sig   acetaminophen (TYLENOL) 500 MG tablet Take 500-1,000 mg by mouth every 6 (six) hours as needed for mild pain or headache.   ALPHA LIPOIC ACID PO Take 1 capsule by mouth daily.   B Complex-C (B-COMPLEX WITH VITAMIN C) tablet Take 1 tablet by mouth in the morning.   celecoxib (CELEBREX) 200 MG capsule Take 200 mg by mouth daily as needed for mild pain.   escitalopram (LEXAPRO) 20 MG tablet Take 20 mg by mouth daily.   levocetirizine (XYZAL) 5 MG tablet Take 5 mg by mouth at bedtime.   levothyroxine (SYNTHROID, LEVOTHROID) 75 MCG tablet Take 1 tablet by mouth daily before breakfast.   tamsulosin (FLOMAX) 0.4 MG CAPS capsule Take 1 capsule (0.4 mg total) by mouth in the morning and at bedtime.   Ascorbic Acid (VITAMIN C) 100 MG tablet Take  100 mg by mouth daily.   Cholecalciferol (VITAMIN D3) 50 MCG (2000 UT) TABS Take 2,000 Units by mouth daily in the afternoon.   cyclobenzaprine (FLEXERIL) 10 MG tablet Take 10 mg by mouth 3 (three) times daily. (Patient not taking: Reported on 04/01/2022)   doxycycline (VIBRAMYCIN) 100 MG capsule Take 1 capsule (100 mg total) by mouth every 12 (twelve) hours. (Patient not taking: Reported on 04/06/2022)   No current facility-administered medications for this visit. (Other)   REVIEW OF SYSTEMS: ROS   Positive for: Genitourinary, Endocrine, Eyes, Psychiatric Negative for: Constitutional, Gastrointestinal, Neurological, Skin, Musculoskeletal, HENT, Cardiovascular, Respiratory, Allergic/Imm, Heme/Lymph Last edited by Leonie Douglas, COA on 04/06/2022  1:17 PM.     ALLERGIES Allergies  Allergen Reactions   Avelox [Moxifloxacin] Other (See Comments)    Caused tendonitis   Penicillins Other (See Comments)    Chills    PAST MEDICAL HISTORY Past Medical History:  Diagnosis Date   Acute medial meniscus tear of left knee    Depression    Frequent PVCs    Hypertension    Hypothyroidism    NSVT (nonsustained ventricular tachycardia) (HCC)    Osteoarthritis    Thyroid disease    Past Surgical History:  Procedure Laterality Date   CATARACT EXTRACTION     left   COLONOSCOPY N/A 04/08/2016   Procedure:  COLONOSCOPY;  Surgeon: Danie Binder, MD;  [facility];  Service: Endoscopy;  Laterality: N/A;  11:15 AM   EYE SURGERY     left-scar tissue   groin surgery  2010   growth removed    KNEE ARTHROSCOPY     left   KNEE ARTHROSCOPY WITH LATERAL MENISECTOMY Right 03/11/2015   Procedure: RIGHT KNEE ARTHROSCOPY WITH MEDIALAND LATERAL MENISECTOMY;  Surgeon: Elsie Saas, MD;  Location: [place];  Service: Orthopedics;  Laterality: Right;   KNEE ARTHROSCOPY WITH MEDIAL MENISECTOMY Left 06/27/2014   Procedure: LEFT KNEE ARTHROSCOPY WITH PARTIAL MEDIAL AND LATERAL  MENISECTOMIES AND CHONDROPLASTY;  Surgeon: Lorn Junes, MD;  Location: [place];  Service: Orthopedics;  Laterality: Left;   KNEE ARTHROSCOPY WITH MEDIAL MENISECTOMY Right 03/11/2015   Procedure: KNEE ARTHROSCOPY WITH MEDIAL MENISECTOMY;  Surgeon: Elsie Saas, MD;  Location: [place];  Service: Orthopedics;  Laterality: Right;   LEFT HEART CATHETERIZATION WITH CORONARY ANGIOGRAM N/A 09/14/2011   Procedure: LEFT HEART CATHETERIZATION WITH CORONARY ANGIOGRAM;  Surgeon: Pixie Casino, MD;  Location: [facility];  Service: Cardiovascular;  Laterality: N/A;   POLYPECTOMY  04/08/2016   Procedure: POLYPECTOMY;  Surgeon: Danie Binder, MD;  [facility];  Service: Endoscopy;;  colon    RETINAL DETACHMENT SURGERY Left 2002   Dr. Merlene Morse   FAMILY HISTORY Family History  Problem Relation Age of Onset   Hypertension Mother    Hypertension Father    Heart attack Father    Diabetes Father    Asthma Daughter    SOCIAL HISTORY Social History   Tobacco Use   Smoking status: Never   Smokeless tobacco: Never   Tobacco comments:    06/10/14 1968- Tried it once and didn't like it- AJ  Vaping Use   Vaping Use: Never used  Substance Use Topics   Alcohol use: Yes    Alcohol/week: 0.0 standard drinks of alcohol    Comment: occasionally   Drug use: No       OPHTHALMIC EXAM:  Base Eye Exam     Visual Acuity (Snellen - Linear)       Right Left   Dist [place] 20/25 20/70   Dist ph  NI 20/50         Tonometry (Tonopen, 1:24 PM)       Right Left   Pressure 18 13         Pupils       Dark Light Shape React APD   Right 3 2 Round Brisk None   Left 3 2 Round Brisk None         Visual Fields (Counting fingers)       Left Right    Full Full         Extraocular Movement       Right Left    Full Full         Neuro/Psych     Oriented x3: Yes   Mood/Affect: Normal         Dilation     Both eyes: 1.0% Mydriacyl, 2.5%  Phenylephrine @ 1:24 PM           Slit Lamp and Fundus Exam     Slit Lamp Exam       Right Left   Lids/Lashes mild MGD, Dermatochalasis - upper lid Dermatochalasis - upper lid   Conjunctiva/Sclera White and quiet White and quiet   Cornea Arcus, trace Debris  in tear film Arcus, well healed temporal cataract wound   Anterior Chamber Deep, narrow temporal angle Deep and quiet   Iris Round and dilated Round and poorly dilated to 3.18m, iridodinesis   Lens 2-3+ NS, 2-3+ Cortical PCIOL   Anterior Vitreous Syneresis, PVD, mild condensations (greatest inferiorly) Syneresis         Fundus Exam       Right Left   Disc Pink and sharp, milld PPA Mild pallor, sharp rim   C/D Ratio 0.4 0.4   Macula Flat, blunted foveal reflex, mild RPE mottling, no heme or edema Flat, blunted foveal reflex, mild RPE mottling.  No heme or edema   Vessels Mild attenuation, Tortuous Mild attenuation, Tortuous   Periphery Laser scars from 0800-1000 surrounding focal detachment--good barricade in place; otherwise attached.  No new RT/RD, mild Reticular degeneration Attached over scleral buckle; CR scarring from 1030-600 clockwise. Limited view due to poor dilation.  No new RT/RD.            IMAGING AND PROCEDURES  Imaging and Procedures for 04/06/2022  OCT, Retina - OU - Both Eyes       Right Eye Quality was good. Central Foveal Thickness: 296. Progression has been stable. Findings include normal foveal contour, no IRF, no SRF.   Left Eye Quality was good. Central Foveal Thickness: 350. Progression has been stable. Findings include no IRF, no SRF, abnormal foveal contour, macular pucker, outer retinal atrophy (Irregular inner-retinal surface. Focal CR atrophy temporal macula caught on widefield.).   Notes *Images captured and stored on drive  Diagnosis / Impression: History of RD OU -- retina attached OU OD: NFP; No IRF/SRF OS: Irregular inner-retinal surface.  Focal CR atrophy temporal macula  caught on widefield. -- stable   Clinical management:  See below  Abbreviations: NFP - Normal foveal profile. CME - cystoid macular edema. PED - pigment epithelial detachment. IRF - intraretinal fluid. SRF - subretinal fluid. EZ - ellipsoid zone. ERM - epiretinal membrane. ORA - outer retinal atrophy. ORT - outer retinal tubulation. SRHM - subretinal hyper-reflective material. IRHM - intraretinal hyper-reflective material           ASSESSMENT/PLAN:    ICD-10-CM   1. History of retinal detachment  Z86.69 OCT, Retina - OU - Both Eyes    2. Combined forms of age-related cataract of right eye  H25.811     3. Pseudophakia  Z96.1      1. Hx of RD OU  - OD focal RD 8-10 oclock s/p laser retinopexy / barricade - early 2000s, Dr. HWeston Brass - OS s/p RD repair early 2000s, Dr. HWeston Brass - has been followed by Dr. KRandell Loop - transferred care here due to proximity and transportation  - RD repairs stable OU  - no new RT/RD OU             - F/u in 1 yr, sooner prn -- DFE/OCT    2. Mixed Cataract OD - The symptoms of cataract, surgical options, and treatments and risks were discussed with patient. - discussed diagnosis and progression - will refer to Dr. VLucianne Leifor cat eval and management  3. Pseudophakia OS  - s/p CE/IOL OS - unknown cPsychologist[place], doing well  - continue to monitor  Ophthalmic Meds Ordered this visit:  No orders of the defined types were placed in this encounter.     Return in about 1 year (around 04/07/2023) for Hx of  RD , DFE, OCT.  There are no Patient Instructions on file for this visit.  Explained the diagnoses, plan, and follow up with the patient and they expressed understanding.  Patient expressed understanding of the importance of proper follow up care.  This document serves as a record of services personally performed by Gardiner Sleeper, MD, PhD. It was created on their behalf by Orvan Falconer, an ophthalmic  technician. The creation of this record is the provider's dictation and/or activities during the visit.    Electronically signed by: Orvan Falconer, OA, 04/06/22  2:09 PM  This document serves as a record of services personally performed by Gardiner Sleeper, MD, PhD. It was created on their behalf by Renaldo Reel, COT an ophthalmic technician. The creation of this record is the provider's dictation and/or activities during the visit.    Electronically signed by:  Renaldo Reel, COT 04/06/22 2:09 PM  Gardiner Sleeper, M.D., Ph.D. Diseases & Surgery of the Retina and Vitreous [facility]  I have reviewed the above documentation for accuracy and completeness, and I agree with the above. Gardiner Sleeper, M.D., Ph.D. 04/06/22 2:09 PM    Abbreviations: M myopia (nearsighted); A astigmatism; H hyperopia (farsighted); P presbyopia; Mrx spectacle prescription;  CTL contact lenses; OD right eye; OS left eye; OU both eyes  XT exotropia; ET esotropia; PEK punctate epithelial keratitis; PEE punctate epithelial erosions; DES dry eye syndrome; MGD meibomian gland dysfunction; ATs artificial tears; PFAT's preservative free artificial tears; [place] nuclear sclerotic cataract; PSC posterior subcapsular cataract; ERM epi-retinal membrane; PVD posterior vitreous detachment; RD retinal detachment; DM diabetes mellitus; DR diabetic retinopathy; NPDR non-proliferative diabetic retinopathy; PDR proliferative diabetic retinopathy; CSME clinically significant macular edema; DME diabetic macular edema; dbh dot blot hemorrhages; CWS cotton wool spot; POAG primary open angle glaucoma; C/D cup-to-disc ratio; HVF humphrey visual field; GVF goldmann visual field; OCT optical coherence tomography; IOP intraocular pressure; BRVO Branch retinal vein occlusion; CRVO central retinal vein occlusion; CRAO central retinal artery occlusion; BRAO branch retinal artery occlusion; RT retinal tear; SB scleral  buckle; PPV pars plana vitrectomy; VH Vitreous hemorrhage; PRP panretinal laser photocoagulation; IVK intravitreal kenalog; VMT vitreomacular traction; MH Macular hole;  NVD neovascularization of the disc; NVE neovascularization elsewhere; AREDS age related eye disease study; ARMD age related macular degeneration; POAG primary open angle glaucoma; EBMD epithelial/anterior basement membrane dystrophy; ACIOL anterior chamber intraocular lens; IOL intraocular lens; PCIOL posterior chamber intraocular lens; Phaco/IOL phacoemulsification with intraocular lens placement; [place] photorefractive keratectomy; LASIK laser assisted in situ keratomileusis; HTN hypertension; DM diabetes mellitus; COPD chronic obstructive pulmonary disease

## 2022-04-01 ENCOUNTER — Ambulatory Visit (INDEPENDENT_AMBULATORY_CARE_PROVIDER_SITE_OTHER): Payer: Medicare PPO | Admitting: Urology

## 2022-04-01 VITALS — BP 132/78 | HR 81

## 2022-04-01 DIAGNOSIS — R972 Elevated prostate specific antigen [PSA]: Secondary | ICD-10-CM

## 2022-04-01 DIAGNOSIS — K4090 Unilateral inguinal hernia, without obstruction or gangrene, not specified as recurrent: Secondary | ICD-10-CM

## 2022-04-01 LAB — URINALYSIS, ROUTINE W REFLEX MICROSCOPIC
Bilirubin, UA: NEGATIVE
Glucose, UA: NEGATIVE
Leukocytes,UA: NEGATIVE
Nitrite, UA: NEGATIVE
Protein,UA: NEGATIVE
RBC, UA: NEGATIVE
Specific Gravity, UA: 1.01 (ref 1.005–1.030)
Urobilinogen, Ur: 0.2 mg/dL (ref 0.2–1.0)
pH, UA: 7 (ref 5.0–7.5)

## 2022-04-01 MED ORDER — TAMSULOSIN HCL 0.4 MG PO CAPS
0.4000 mg | ORAL_CAPSULE | Freq: Two times a day (BID) | ORAL | 11 refills | Status: DC
Start: 2022-04-01 — End: 2022-11-23

## 2022-04-01 NOTE — Progress Notes (Signed)
04/01/2022 12:07 PM   [place] Nurse August 03, 1946 [id number]  Referring provider: [facility] [address],  [place] [address]  Followup BPH   HPI: [name] is a 76yo here for followup for BPH and elevated PSA. PSA 9.9. He had a previous negative biopsy with Dr. Consuella Lose when his PSA was 11. Prostate volume 205cc.  No significant LUTS. IPSS 7 QOL 1. He is concerned about his right inguinal hernia and wishes referral to surgery   PMH: Past Medical History:  Diagnosis Date   Acute medial meniscus tear of left knee    Depression    Frequent PVCs    Hypertension    Hypothyroidism    NSVT (nonsustained ventricular tachycardia) (HCC)    Osteoarthritis    Thyroid disease     Surgical History: Past Surgical History:  Procedure Laterality Date   CATARACT EXTRACTION     left   COLONOSCOPY N/A 04/08/2016   Procedure: COLONOSCOPY;  Surgeon: Danie Binder, MD;  [facility];  Service: Endoscopy;  Laterality: N/A;  11:15 AM   EYE SURGERY     left-scar tissue   groin surgery  2010   growth removed    KNEE ARTHROSCOPY     left   KNEE ARTHROSCOPY WITH LATERAL MENISECTOMY Right 03/11/2015   Procedure: RIGHT KNEE ARTHROSCOPY WITH MEDIALAND LATERAL MENISECTOMY;  Surgeon: Elsie Saas, MD;  Location: [place];  Service: Orthopedics;  Laterality: Right;   KNEE ARTHROSCOPY WITH MEDIAL MENISECTOMY Left 06/27/2014   Procedure: LEFT KNEE ARTHROSCOPY WITH PARTIAL MEDIAL AND LATERAL MENISECTOMIES AND CHONDROPLASTY;  Surgeon: Lorn Junes, MD;  Location: [place];  Service: Orthopedics;  Laterality: Left;   KNEE ARTHROSCOPY WITH MEDIAL MENISECTOMY Right 03/11/2015   Procedure: KNEE ARTHROSCOPY WITH MEDIAL MENISECTOMY;  Surgeon: Elsie Saas, MD;  Location: [place];  Service: Orthopedics;  Laterality: Right;   LEFT HEART CATHETERIZATION WITH CORONARY ANGIOGRAM N/A 09/14/2011   Procedure: LEFT HEART  CATHETERIZATION WITH CORONARY ANGIOGRAM;  Surgeon: Pixie Casino, MD;  [facility];  Service: Cardiovascular;  Laterality: N/A;   POLYPECTOMY  04/08/2016   Procedure: POLYPECTOMY;  Surgeon: Danie Binder, MD;  [facility];  Service: Endoscopy;;  colon    RETINAL DETACHMENT SURGERY Left 2002   Dr. Merlene Morse    Home Medications:  Allergies as of 04/01/2022       Reactions   Avelox [moxifloxacin] Other (See Comments)   Caused tendonitis   Penicillins Other (See Comments)   Chills        Medication List        Accurate as of April 01, 2022 12:07 PM. If you have any questions, ask your nurse or doctor.          acetaminophen 500 MG tablet Commonly known as: TYLENOL Take 500-1,000 mg by mouth every 6 (six) hours as needed for mild pain or headache.   ALPHA LIPOIC ACID PO Take 1 capsule by mouth daily.   B-complex with vitamin C tablet Take 1 tablet by mouth in the morning.   celecoxib 200 MG capsule Commonly known as: CELEBREX Take 200 mg by mouth daily as needed for mild pain.   cyclobenzaprine 10 MG tablet Commonly known as: FLEXERIL Take 10 mg by mouth 3 (three) times daily.   doxycycline 100 MG capsule Commonly known as: VIBRAMYCIN Take 1 capsule (100 mg total) by mouth every 12 (twelve) hours.   escitalopram  20 MG tablet Commonly known as: LEXAPRO Take 20 mg by mouth daily.   levocetirizine 5 MG tablet Commonly known as: XYZAL Take 5 mg by mouth at bedtime.   levothyroxine 75 MCG tablet Commonly known as: SYNTHROID Take 1 tablet by mouth daily before breakfast.   tamsulosin 0.4 MG Caps capsule Commonly known as: FLOMAX Take 1 capsule (0.4 mg total) by mouth in the morning and at bedtime.   vitamin C 100 MG tablet Take 100 mg by mouth daily.   Vitamin D3 50 MCG (2000 UT) Tabs Take 2,000 Units by mouth daily in the afternoon.        Allergies:  Allergies  Allergen Reactions   Avelox [Moxifloxacin] Other (See Comments)     Caused tendonitis   Penicillins Other (See Comments)    Chills     Family History: Family History  Problem Relation Age of Onset   Hypertension Mother    Hypertension Father    Heart attack Father    Diabetes Father    Asthma Daughter     Social History:  reports that he has never smoked. He has never used smokeless tobacco. He reports current alcohol use. He reports that he does not use drugs.  ROS: All other review of systems were reviewed and are negative except what is noted above in HPI  Physical Exam: BP 132/78   Pulse 81   Constitutional:  Alert and oriented, No acute distress. HEENT: [place] AT, moist mucus membranes.  Trachea midline, no masses. Cardiovascular: No clubbing, cyanosis, or edema. Respiratory: Normal respiratory effort, no increased work of breathing. GI: Abdomen is soft, nontender, nondistended, no abdominal masses GU: No CVA tenderness.  Lymph: No cervical or inguinal lymphadenopathy. Skin: No rashes, bruises or suspicious lesions. Neurologic: Grossly intact, no focal deficits, moving all 4 extremities. Psychiatric: Normal mood and affect.  Laboratory Data: Lab Results  Component Value Date   WBC 10.5 02/01/2022   HGB 12.3 (L) 02/01/2022   HCT 36.7 (L) 02/01/2022   MCV 89.5 02/01/2022   PLT 153 02/01/2022    Lab Results  Component Value Date   CREATININE 0.84 02/02/2022    No results found for: "PSA"  No results found for: "TESTOSTERONE"  No results found for: "HGBA1C"  Urinalysis    Component Value Date/Time   COLORURINE STRAW (A) 02/03/2022 0036   APPEARANCEUR Clear 02/15/2022 1329   LABSPEC 1.005 02/03/2022 0036   PHURINE 7.0 02/03/2022 0036   GLUCOSEU Negative 02/15/2022 1329   HGBUR NEGATIVE 02/03/2022 0036   BILIRUBINUR Negative 02/15/2022 1329   KETONESUR 20 (A) 02/03/2022 0036   PROTEINUR 1+ (A) 02/15/2022 1329   PROTEINUR NEGATIVE 02/03/2022 0036   UROBILINOGEN 0.2 08/08/2011 0800   NITRITE Negative 02/15/2022 1329    NITRITE NEGATIVE 02/03/2022 0036   LEUKOCYTESUR Trace (A) 02/15/2022 1329   LEUKOCYTESUR NEGATIVE 02/03/2022 0036    Lab Results  Component Value Date   LABMICR See below: 02/15/2022   WBCUA 6-10 (A) 02/15/2022   LABEPIT 0-10 02/15/2022   MUCUS Present 02/15/2022   BACTERIA None seen 02/15/2022    Pertinent Imaging:  No results found for this or any previous visit.  No results found for this or any previous visit.  No results found for this or any previous visit.  No results found for this or any previous visit.  No results found for this or any previous visit.  No results found for this or any previous visit.  No results found for this or any  previous visit.  Results for orders placed during the hospital encounter of 01/30/22  CT Renal Stone Study  Narrative CLINICAL DATA:  Flank pain.  EXAM: CT ABDOMEN AND PELVIS WITHOUT CONTRAST  TECHNIQUE: Multidetector CT imaging of the abdomen and pelvis was performed following the standard protocol without IV contrast.  RADIATION DOSE REDUCTION: This exam was performed according to the departmental dose-optimization program which includes automated exposure control, adjustment of the mA and/or kV according to patient size and/or use of iterative reconstruction technique.  COMPARISON:  CT abdomen and pelvis 01/18/2007  FINDINGS: Lower chest: There is a new 6 mm nodule in the left lung base. Lung bases are otherwise clear. There are calcified granulomas in the lung bases.  Hepatobiliary: Calcified granulomas are present. There is a 3 cm cyst in the inferior right lobe of the liver which has slightly increased in size compared to the prior study. Gallbladder and bile ducts are within normal limits.  Pancreas: Unremarkable. No pancreatic ductal dilatation or surrounding inflammatory changes.  Spleen: Calcified granulomas are present. Otherwise within normal limits.  Adrenals/Urinary Tract: Foley catheter is  seen within the bladder. No urinary tract calculus or hydronephrosis. Adrenal glands within normal limits.  Stomach/Bowel: Stomach is within normal limits. Appendix appears normal. No evidence of bowel wall thickening, distention, or inflammatory changes. Appendix is not seen. Small hiatal hernia and duodenal diverticulum again noted.  Vascular/Lymphatic: Aortic atherosclerosis. No enlarged abdominal or pelvic lymph nodes.  Reproductive: Prostate gland is enlarged and lobulated with nodular protrusion into the bladder base. Prostate measures 7.0 x 7.3 by 5.9 cm and has significantly increased in size.  Other: No ascites.  Small fat containing inguinal hernias.  Musculoskeletal: No acute or significant osseous findings.  IMPRESSION: 1. No evidence for urinary tract calculus or hydronephrosis. 2. Foley catheter in the bladder. 3. Marked enlargement of the prostate gland with nodular protrusion into the bladder base, new from 2008. Recommend clinical correlation and follow-up to exclude neoplasm. 4. Hepatic cyst. 5. Old granulomatous disease. 6. 6 mm left solid pulmonary nodule. Recommend a non-contrast Chest CT at 6-12 months. If patient is high risk for malignancy, recommend an additional non-contrast Chest CT at 18-24 months; if patient is low risk for malignancy a non-contrast Chest CT at 18-24 months is optional. These guidelines do not apply to immunocompromised patients and patients with cancer. Follow up in patients with significant comorbidities as clinically warranted. For lung cancer screening, adhere to Lung-RADS guidelines. Reference: Radiology. 2017; 284(1):228-43.   Electronically Signed By: Ronney Asters M.D. On: 01/30/2022 16:15   Assessment & Plan:    1. Elevated PSA RTC 1 year with PSA - Urinalysis, Routine w reflex microscopic   No follow-ups on file.  Nicolette Bang, MD  [facility] [place]

## 2022-04-06 ENCOUNTER — Encounter (INDEPENDENT_AMBULATORY_CARE_PROVIDER_SITE_OTHER): Payer: Self-pay | Admitting: Ophthalmology

## 2022-04-06 ENCOUNTER — Ambulatory Visit (INDEPENDENT_AMBULATORY_CARE_PROVIDER_SITE_OTHER): Payer: Medicare PPO | Admitting: Ophthalmology

## 2022-04-06 DIAGNOSIS — H25811 Combined forms of age-related cataract, right eye: Secondary | ICD-10-CM

## 2022-04-06 DIAGNOSIS — Z961 Presence of intraocular lens: Secondary | ICD-10-CM | POA: Diagnosis not present

## 2022-04-06 DIAGNOSIS — Z8669 Personal history of other diseases of the nervous system and sense organs: Secondary | ICD-10-CM

## 2022-04-07 DIAGNOSIS — L570 Actinic keratosis: Secondary | ICD-10-CM | POA: Diagnosis not present

## 2022-04-07 DIAGNOSIS — X32XXXD Exposure to sunlight, subsequent encounter: Secondary | ICD-10-CM | POA: Diagnosis not present

## 2022-04-07 DIAGNOSIS — L258 Unspecified contact dermatitis due to other agents: Secondary | ICD-10-CM | POA: Diagnosis not present

## 2022-04-11 ENCOUNTER — Encounter: Payer: Self-pay | Admitting: Urology

## 2022-04-11 NOTE — Patient Instructions (Signed)

## 2022-04-21 ENCOUNTER — Ambulatory Visit (INDEPENDENT_AMBULATORY_CARE_PROVIDER_SITE_OTHER): Payer: Medicare PPO

## 2022-04-21 ENCOUNTER — Ambulatory Visit: Payer: Medicare PPO | Admitting: Podiatry

## 2022-04-21 DIAGNOSIS — M722 Plantar fascial fibromatosis: Secondary | ICD-10-CM | POA: Diagnosis not present

## 2022-04-21 MED ORDER — MELOXICAM 15 MG PO TABS: 15.0000 mg | ORAL_TABLET | Freq: Every day | ORAL | 3 refills | Status: DC

## 2022-04-21 MED ORDER — TRIAMCINOLONE ACETONIDE 40 MG/ML IJ SUSP
40.0000 mg | Freq: Once | INTRAMUSCULAR | Status: AC
  Administered 2022-04-21: 40 mg

## 2022-04-21 MED ORDER — METHYLPREDNISOLONE 4 MG PO TBPK: ORAL_TABLET | ORAL | 0 refills | Status: DC

## 2022-04-21 NOTE — Patient Instructions (Signed)
Plantar Fasciitis Rehab Ask your health care provider which exercises are safe for you. Do exercises exactly as told by your health care provider and adjust them as directed. It is normal to feel mild stretching, pulling, tightness, or discomfort as you do these exercises. Stop right away if you feel sudden pain or your pain gets worse. Do not begin these exercises until told by your health care provider. Stretching and range-of-motion exercises These exercises warm up your muscles and joints and improve the movement and flexibility of your foot. These exercises also help to relieve pain. Plantar fascia stretch  Sit with your left / right leg crossed over your opposite knee. Hold your heel with one hand with that thumb near your arch. With your other hand, hold your toes and gently pull them back toward the top of your foot. You should feel a stretch on the base (bottom) of your toes, or the bottom of your foot (plantar fascia), or both. Hold this stretch for__________ seconds. Slowly release your toes and return to the starting position. Repeat __________ times. Complete this exercise __________ times a day. Gastrocnemius stretch, standing This exercise is also called a calf (gastroc) stretch. It stretches the muscles in the back of the upper calf. Stand with your hands against a wall. Extend your left / right leg behind you, and bend your front knee slightly. Keeping your heels on the floor, your toes facing forward, and your back knee straight, shift your weight toward the wall. Do not arch your back. You should feel a gentle stretch in your upper calf. Hold this position for __________ seconds. Repeat __________ times. Complete this exercise __________ times a day. Soleus stretch, standing This exercise is also called a calf (soleus) stretch. It stretches the muscles in the back of the lower calf. Stand with your hands against a wall. Extend your left / right leg behind you, and bend your  front knee slightly. Keeping your heels on the floor and your toes facing forward, bend your back knee and shift your weight slightly over your back leg. You should feel a gentle stretch deep in your lower calf. Hold this position for __________ seconds. Repeat __________ times. Complete this exercise __________ times a day. Gastroc and soleus stretch, standing step This exercise stretches the muscles in the back of the lower leg. These muscles are in the upper calf (gastrocnemius) and the lower calf (soleus). Stand with the ball of your left / right foot on the front of a step. The ball of your foot is on the walking surface, right under your toes. Keep your other foot firmly on the same step. Hold on to the wall or a railing for balance. Slowly lift your other foot, allowing your body weight to press your heel down over the edge of the front of the step. Keep knee straight and unbent. You should feel a stretch in your calf. Hold this position for __________ seconds. Return both feet to the step. Repeat this exercise with a slight bend in your left / right knee. Repeat __________ times with your left / right knee straight and __________ times with your left / right knee bent. Complete this exercise __________ times a day. Balance exercise This exercise builds your balance and strength control of your arch to help take pressure off your plantar fascia. Single leg stand If this exercise is too easy, you can try it with your eyes closed or while standing on a pillow. Without shoes, stand near a   railing or in a doorway. You may hold on to the railing or door frame as needed. Stand on your left / right foot. Keep your big toe down on the floor and lift the arch of your foot. You should feel a stretch across the bottom of your foot and your arch. Do not let your foot roll inward. Hold this position for __________ seconds. Repeat __________ times. Complete this exercise __________ times a day. This  information is not intended to replace advice given to you by your health care provider. Make sure you discuss any questions you have with your health care provider. Document Revised: 06/11/2020 Document Reviewed: 06/11/2020 Elsevier Patient Education  2023 Elsevier Inc.  

## 2022-04-21 NOTE — Progress Notes (Signed)
He presents today states that his bilateral heels of started hurting again.  Objective: Vital signs stable he is alert oriented x 3 pulses are palpable.  There is no erythema edema cellulitis drainage or odor.  He has pain on palpation to the bilateral heels.  Radiographs taken today demonstrate osseously mature foot plantar distally oriented calcaneal heel spurs posterior heel spurs as well.  He has a thickening of the plantar fascia at his calcaneal insertion sites bilateral.  Assessment: Planter fasciitis bilateral.  Plan: Reinjected bilateral heels today 20 mg Kenalog 5 mg Marcaine.  Started him on methylprednisolone to be followed by meloxicam.

## 2022-05-12 ENCOUNTER — Ambulatory Visit: Payer: Medicare PPO | Admitting: General Surgery

## 2022-05-12 ENCOUNTER — Encounter: Payer: Self-pay | Admitting: General Surgery

## 2022-05-12 VITALS — BP 138/81 | HR 61 | Temp 98.4°F | Resp 14 | Ht 77.0 in | Wt 212.0 lb

## 2022-05-12 DIAGNOSIS — K4090 Unilateral inguinal hernia, without obstruction or gangrene, not specified as recurrent: Secondary | ICD-10-CM | POA: Diagnosis not present

## 2022-05-12 NOTE — Progress Notes (Signed)
Micheal Baker; [id number]; 04-Apr-1946   HPI Patient is a 76 year old white male who was referred to my care by Dr. Alyson Ingles of urology for evaluation and treatment of a right inguinal hernia.  I saw him in their office a month ago for curbside consultation.  He did have a small right inguinal hernia at that time which was slightly tender.  He was complaining more of right testicular pain which is being treated by his office.  Since that time, he has had no complaints.  He has had no nausea or vomiting.  He has not noticed a bulge in the right inguinal region.  It is not affecting his daily lifestyle.  He does work out and is asking about any limitations. Past Medical History:  Diagnosis Date   Acute medial meniscus tear of left knee    Depression    Frequent PVCs    Hypertension    Hypothyroidism    NSVT (nonsustained ventricular tachycardia) (HCC)    Osteoarthritis    Thyroid disease     Past Surgical History:  Procedure Laterality Date   CATARACT EXTRACTION     left   COLONOSCOPY N/A 04/08/2016   Procedure: COLONOSCOPY;  Surgeon: Danie Binder, MD;  [facility];  Service: Endoscopy;  Laterality: N/A;  11:15 AM   EYE SURGERY     left-scar tissue   groin surgery  2010   growth removed    KNEE ARTHROSCOPY     left   KNEE ARTHROSCOPY WITH LATERAL MENISECTOMY Right 03/11/2015   Procedure: RIGHT KNEE ARTHROSCOPY WITH MEDIALAND LATERAL MENISECTOMY;  Surgeon: Elsie Saas, MD;  Location: [place];  Service: Orthopedics;  Laterality: Right;   KNEE ARTHROSCOPY WITH MEDIAL MENISECTOMY Left 06/27/2014   Procedure: LEFT KNEE ARTHROSCOPY WITH PARTIAL MEDIAL AND LATERAL MENISECTOMIES AND CHONDROPLASTY;  Surgeon: Lorn Junes, MD;  Location: [place];  Service: Orthopedics;  Laterality: Left;   KNEE ARTHROSCOPY WITH MEDIAL MENISECTOMY Right 03/11/2015   Procedure: KNEE ARTHROSCOPY WITH MEDIAL MENISECTOMY;  Surgeon: Elsie Saas, MD;  Location: [place];  Service: Orthopedics;  Laterality: Right;   LEFT HEART CATHETERIZATION WITH CORONARY ANGIOGRAM N/A 09/14/2011   Procedure: LEFT HEART CATHETERIZATION WITH CORONARY ANGIOGRAM;  Surgeon: Pixie Casino, MD;  Location: [facility] CATH LAB;  Service: Cardiovascular;  Laterality: N/A;   POLYPECTOMY  04/08/2016   Procedure: POLYPECTOMY;  Surgeon: Danie Binder, MD;  [facility];  Service: Endoscopy;;  colon    RETINAL DETACHMENT SURGERY Left 2002   Dr. Merlene Morse    Family History  Problem Relation Age of Onset   Hypertension Mother    Hypertension Father    Heart attack Father    Diabetes Father    Asthma Daughter     Current Outpatient Medications on File Prior to Visit  Medication Sig Dispense Refill   acetaminophen (TYLENOL) 500 MG tablet Take 500-1,000 mg by mouth every 6 (six) hours as needed for mild pain or headache.     ALPHA LIPOIC ACID PO Take 1 capsule by mouth daily.     Ascorbic Acid (VITAMIN C) 100 MG tablet Take 100 mg by mouth daily.     B Complex-C (B-COMPLEX WITH VITAMIN C) tablet Take 1 tablet by mouth in the morning.     celecoxib (CELEBREX) 200 MG capsule Take 200 mg by mouth daily as needed for mild pain.     Cholecalciferol (VITAMIN D3) 50 MCG (2000 UT) TABS Take 2,000  Units by mouth daily in the afternoon.     cyclobenzaprine (FLEXERIL) 10 MG tablet Take 10 mg by mouth 3 (three) times daily as needed for muscle spasms.     escitalopram (LEXAPRO) 20 MG tablet Take 20 mg by mouth daily.     levocetirizine (XYZAL) 5 MG tablet Take 5 mg by mouth at bedtime.     levothyroxine (SYNTHROID, LEVOTHROID) 75 MCG tablet Take 1 tablet by mouth daily before breakfast.     meloxicam (MOBIC) 15 MG tablet Take 1 tablet (15 mg total) by mouth daily. 30 tablet 3   tamsulosin (FLOMAX) 0.4 MG CAPS capsule Take 1 capsule (0.4 mg total) by mouth in the morning and at bedtime. 60 capsule 11   No current facility-administered medications on file prior to visit.     Allergies  Allergen Reactions   Avelox [Moxifloxacin] Other (See Comments)    Caused tendonitis   Penicillins Other (See Comments)    Chills     Social History   Substance and Sexual Activity  Alcohol Use Yes   Alcohol/week: 0.0 standard drinks of alcohol   Comment: occasionally    Social History   Tobacco Use  Smoking Status Never  Smokeless Tobacco Never  Tobacco Comments   06/10/14 1968- Tried it once and didn't like it- AJ    Review of Systems  Constitutional:  Positive for malaise/fatigue.  HENT:  Positive for sinus pain.   Eyes: Negative.   Respiratory: Negative.    Cardiovascular: Negative.   Gastrointestinal: Negative.   Genitourinary:  Positive for frequency.  Musculoskeletal:  Positive for back pain and joint pain.  Skin: Negative.   Neurological: Negative.   Endo/Heme/Allergies: Negative.   Psychiatric/Behavioral: Negative.      Objective   Vitals:   05/12/22 1050  BP: 138/81  Pulse: 61  Resp: 14  Temp: 98.4 F (36.9 C)  SpO2: 95%    Physical Exam Vitals reviewed.  Constitutional:      Appearance: Normal appearance. He is normal weight. He is not ill-appearing.  HENT:     Head: Normocephalic and atraumatic.  Cardiovascular:     Rate and Rhythm: Normal rate and regular rhythm.     Heart sounds: Normal heart sounds. No murmur heard.    No friction rub. No gallop.  Pulmonary:     Effort: Pulmonary effort is normal. No respiratory distress.     Breath sounds: Normal breath sounds. No stridor. No wheezing, rhonchi or rales.  Abdominal:     General: Abdomen is flat. Bowel sounds are normal. There is no distension.     Palpations: There is no mass.     Tenderness: There is no abdominal tenderness. There is no guarding or rebound.     Hernia: A hernia is present.     Comments: A small easily reducible right inguinal hernia is noted on straining.  Skin:    General: Skin is warm and dry.  Neurological:     Mental Status: He is alert and  oriented to person, place, and time.    Previous neurological notes reviewed Assessment  Right inguinal hernia, currently asymptomatic Plan  No need for surgical intervention at the present time.  Patient understands and agrees.  I told him to avoid any actions that increase his abdominal activity, which could cause right groin pain.  Should he develop more right groin symptoms, he was instructed to return back to my office for evaluation and treatment.  Literature was given.  Follow-up here as  needed.

## 2022-05-13 DIAGNOSIS — M9902 Segmental and somatic dysfunction of thoracic region: Secondary | ICD-10-CM | POA: Diagnosis not present

## 2022-05-13 DIAGNOSIS — M9905 Segmental and somatic dysfunction of pelvic region: Secondary | ICD-10-CM | POA: Diagnosis not present

## 2022-05-13 DIAGNOSIS — M9903 Segmental and somatic dysfunction of lumbar region: Secondary | ICD-10-CM | POA: Diagnosis not present

## 2022-05-13 DIAGNOSIS — M62830 Muscle spasm of back: Secondary | ICD-10-CM | POA: Diagnosis not present

## 2022-05-18 ENCOUNTER — Ambulatory Visit (HOSPITAL_COMMUNITY)
Admission: RE | Admit: 2022-05-18 | Discharge: 2022-05-18 | Disposition: A | Discharge status: Home / Self Care | Payer: Medicare PPO | Source: Ambulatory Visit | Attending: Physician Assistant | Admitting: Physician Assistant

## 2022-05-18 DIAGNOSIS — N442 Benign cyst of testis: Secondary | ICD-10-CM | POA: Insufficient documentation

## 2022-05-18 DIAGNOSIS — N433 Hydrocele, unspecified: Secondary | ICD-10-CM | POA: Diagnosis not present

## 2022-05-18 DIAGNOSIS — N503 Cyst of epididymis: Secondary | ICD-10-CM | POA: Diagnosis not present

## 2022-05-23 ENCOUNTER — Telehealth: Payer: Self-pay

## 2022-05-23 NOTE — Telephone Encounter (Signed)
-----   Message from Reynaldo Minium, [place] sent at 05/23/2022  9:46 AM EDT ----- Please let pt know his recent US looks okay per Dr. Alyson Ingles. There is one small cyst that could be surgically removed if it is bothersome. If he would like to discuss surgical option, he may see Dr. Alyson Ingles sooner than his next appt in January.  ----- Message ----- From: Interface, Rad Results In Sent: 05/18/2022   5:09 PM EDT To: Reynaldo Minium, PA-C

## 2022-05-23 NOTE — Telephone Encounter (Signed)
Made patient aware that he has a small cyst that could be surgical remove if it is bothersome. Patient opt to see Dr. Alyson Ingles to discuss surgery options for the small cyst. Patient is schedule to see Dr. Alyson Ingles on 09/15. Patient voiced understanding.

## 2022-05-27 ENCOUNTER — Ambulatory Visit: Payer: Medicare PPO | Admitting: Urology

## 2022-05-27 VITALS — BP 145/85 | HR 60

## 2022-05-27 DIAGNOSIS — N401 Enlarged prostate with lower urinary tract symptoms: Secondary | ICD-10-CM | POA: Diagnosis not present

## 2022-05-27 DIAGNOSIS — R339 Retention of urine, unspecified: Secondary | ICD-10-CM | POA: Diagnosis not present

## 2022-05-27 DIAGNOSIS — N138 Other obstructive and reflux uropathy: Secondary | ICD-10-CM

## 2022-05-27 DIAGNOSIS — N442 Benign cyst of testis: Secondary | ICD-10-CM

## 2022-05-27 NOTE — Progress Notes (Unsigned)
05/27/2022 12:53 PM   [place] Nurse 10-31-1945 [id number]  Referring provider: Sharilyn Sites, [place],  [place] [id number]  No chief complaint on file.   HPI:    PMH: Past Medical History:  Diagnosis Date   Acute medial meniscus tear of left knee    Depression    Frequent PVCs    Hypertension    Hypothyroidism    NSVT (nonsustained ventricular tachycardia) (HCC)    Osteoarthritis    Thyroid disease     Surgical History: Past Surgical History:  Procedure Laterality Date   CATARACT EXTRACTION     left   COLONOSCOPY N/A 04/08/2016   Procedure: COLONOSCOPY;  Surgeon: Danie Binder, MD;  [facility];  Service: Endoscopy;  Laterality: N/A;  11:15 AM   EYE SURGERY     left-scar tissue   groin surgery  2010   growth removed    KNEE ARTHROSCOPY     left   KNEE ARTHROSCOPY WITH LATERAL MENISECTOMY Right 03/11/2015   Procedure: RIGHT KNEE ARTHROSCOPY WITH MEDIALAND LATERAL MENISECTOMY;  Surgeon: Elsie Saas, MD;  Location: [place];  Service: Orthopedics;  Laterality: Right;   KNEE ARTHROSCOPY WITH MEDIAL MENISECTOMY Left 06/27/2014   Procedure: LEFT KNEE ARTHROSCOPY WITH PARTIAL MEDIAL AND LATERAL MENISECTOMIES AND CHONDROPLASTY;  Surgeon: Lorn Junes, MD;  Location: [place];  Service: Orthopedics;  Laterality: Left;   KNEE ARTHROSCOPY WITH MEDIAL MENISECTOMY Right 03/11/2015   Procedure: KNEE ARTHROSCOPY WITH MEDIAL MENISECTOMY;  Surgeon: Elsie Saas, MD;  Location: [place];  Service: Orthopedics;  Laterality: Right;   LEFT HEART CATHETERIZATION WITH CORONARY ANGIOGRAM N/A 09/14/2011   Procedure: LEFT HEART CATHETERIZATION WITH CORONARY ANGIOGRAM;  Surgeon: Pixie Casino, MD;  Location: [facility] CATH LAB;  Service: Cardiovascular;  Laterality: N/A;   POLYPECTOMY  04/08/2016   Procedure: POLYPECTOMY;  Surgeon: Danie Binder, MD;  [facility];  Service: Endoscopy;;  colon     RETINAL DETACHMENT SURGERY Left 2002   Dr. Merlene Morse    Home Medications:  Allergies as of 05/27/2022       Reactions   Avelox [moxifloxacin] Other (See Comments)   Caused tendonitis   Penicillins Other (See Comments)   Chills        Medication List        Accurate as of May 27, 2022 12:53 PM. If you have any questions, ask your nurse or doctor.          acetaminophen 500 MG tablet Commonly known as: TYLENOL Take 500-1,000 mg by mouth every 6 (six) hours as needed for mild pain or headache.   ALPHA LIPOIC ACID PO Take 1 capsule by mouth daily.   B-complex with vitamin C tablet Take 1 tablet by mouth in the morning.   celecoxib 200 MG capsule Commonly known as: CELEBREX Take 200 mg by mouth daily as needed for mild pain.   cyclobenzaprine 10 MG tablet Commonly known as: FLEXERIL Take 10 mg by mouth 3 (three) times daily as needed for muscle spasms.   escitalopram 20 MG tablet Commonly known as: LEXAPRO Take 20 mg by mouth daily.   levocetirizine 5 MG tablet Commonly known as: XYZAL Take 5 mg by mouth at bedtime.   levothyroxine 75 MCG tablet Commonly known as: SYNTHROID Take 1 tablet by mouth daily before breakfast.   meloxicam 15 MG tablet Commonly known as: MOBIC Take 1 tablet (15 mg total) by mouth daily.  tamsulosin 0.4 MG Caps capsule Commonly known as: FLOMAX Take 1 capsule (0.4 mg total) by mouth in the morning and at bedtime.   vitamin C 100 MG tablet Take 100 mg by mouth daily.   Vitamin D3 50 MCG (2000 UT) Tabs Take 2,000 Units by mouth daily in the afternoon.        Allergies:  Allergies  Allergen Reactions   Avelox [Moxifloxacin] Other (See Comments)    Caused tendonitis   Penicillins Other (See Comments)    Chills     Family History: Family History  Problem Relation Age of Onset   Hypertension Mother    Hypertension Father    Heart attack Father    Diabetes Father    Asthma Daughter     Social History:   reports that he has never smoked. He has never used smokeless tobacco. He reports current alcohol use. He reports that he does not use drugs.  ROS: All other review of systems were reviewed and are negative except what is noted above in HPI  Physical Exam: BP (!) 145/85   Pulse 60   Constitutional:  Alert and oriented, No acute distress. HEENT: [place] AT, moist mucus membranes.  Trachea midline, no masses. Cardiovascular: No clubbing, cyanosis, or edema. Respiratory: Normal respiratory effort, no increased work of breathing. GI: Abdomen is soft, nontender, nondistended, no abdominal masses GU: No CVA tenderness. Circumcised phallus. No masses/lesions on penis, testis, scrotum. Prostate 40g smooth no nodules no induration.  Lymph: No cervical or inguinal lymphadenopathy. Skin: No rashes, bruises or suspicious lesions. Neurologic: Grossly intact, no focal deficits, moving all 4 extremities. Psychiatric: Normal mood and affect.  Laboratory Data: Lab Results  Component Value Date   WBC 10.5 02/01/2022   HGB 12.3 (L) 02/01/2022   HCT 36.7 (L) 02/01/2022   MCV 89.5 02/01/2022   PLT 153 02/01/2022    Lab Results  Component Value Date   CREATININE 0.84 02/02/2022    No results found for: "PSA"  No results found for: "TESTOSTERONE"  No results found for: "HGBA1C"  Urinalysis    Component Value Date/Time   COLORURINE STRAW (A) 02/03/2022 0036   APPEARANCEUR Clear 04/01/2022 1241   LABSPEC 1.005 02/03/2022 0036   PHURINE 7.0 02/03/2022 0036   GLUCOSEU Negative 04/01/2022 1241   HGBUR NEGATIVE 02/03/2022 0036   BILIRUBINUR Negative 04/01/2022 1241   KETONESUR 20 (A) 02/03/2022 0036   PROTEINUR Negative 04/01/2022 1241   PROTEINUR NEGATIVE 02/03/2022 0036   UROBILINOGEN 0.2 08/08/2011 0800   NITRITE Negative 04/01/2022 1241   NITRITE NEGATIVE 02/03/2022 0036   LEUKOCYTESUR Negative 04/01/2022 1241   LEUKOCYTESUR NEGATIVE 02/03/2022 0036    Lab Results  Component Value Date    LABMICR Comment 04/01/2022   WBCUA 6-10 (A) 02/15/2022   LABEPIT 0-10 02/15/2022   MUCUS Present 02/15/2022   BACTERIA None seen 02/15/2022    Pertinent Imaging: *** No results found for this or any previous visit.  No results found for this or any previous visit.  No results found for this or any previous visit.  No results found for this or any previous visit.  No results found for this or any previous visit.  No results found for this or any previous visit.  No results found for this or any previous visit.  Results for orders placed during the hospital encounter of 01/30/22  CT Renal Stone Study  Narrative CLINICAL DATA:  Flank pain.  EXAM: CT ABDOMEN AND PELVIS WITHOUT CONTRAST  TECHNIQUE: Multidetector CT  imaging of the abdomen and pelvis was performed following the standard protocol without IV contrast.  RADIATION DOSE REDUCTION: This exam was performed according to the departmental dose-optimization program which includes automated exposure control, adjustment of the mA and/or kV according to patient size and/or use of iterative reconstruction technique.  COMPARISON:  CT abdomen and pelvis 01/18/2007  FINDINGS: Lower chest: There is a new 6 mm nodule in the left lung base. Lung bases are otherwise clear. There are calcified granulomas in the lung bases.  Hepatobiliary: Calcified granulomas are present. There is a 3 cm cyst in the inferior right lobe of the liver which has slightly increased in size compared to the prior study. Gallbladder and bile ducts are within normal limits.  Pancreas: Unremarkable. No pancreatic ductal dilatation or surrounding inflammatory changes.  Spleen: Calcified granulomas are present. Otherwise within normal limits.  Adrenals/Urinary Tract: Foley catheter is seen within the bladder. No urinary tract calculus or hydronephrosis. Adrenal glands within normal limits.  Stomach/Bowel: Stomach is within normal limits.  Appendix appears normal. No evidence of bowel wall thickening, distention, or inflammatory changes. Appendix is not seen. Small hiatal hernia and duodenal diverticulum again noted.  Vascular/Lymphatic: Aortic atherosclerosis. No enlarged abdominal or pelvic lymph nodes.  Reproductive: Prostate gland is enlarged and lobulated with nodular protrusion into the bladder base. Prostate measures 7.0 x 7.3 by 5.9 cm and has significantly increased in size.  Other: No ascites.  Small fat containing inguinal hernias.  Musculoskeletal: No acute or significant osseous findings.  IMPRESSION: 1. No evidence for urinary tract calculus or hydronephrosis. 2. Foley catheter in the bladder. 3. Marked enlargement of the prostate gland with nodular protrusion into the bladder base, new from 2008. Recommend clinical correlation and follow-up to exclude neoplasm. 4. Hepatic cyst. 5. Old granulomatous disease. 6. 6 mm left solid pulmonary nodule. Recommend a non-contrast Chest CT at 6-12 months. If patient is high risk for malignancy, recommend an additional non-contrast Chest CT at 18-24 months; if patient is low risk for malignancy a non-contrast Chest CT at 18-24 months is optional. These guidelines do not apply to immunocompromised patients and patients with cancer. Follow up in patients with significant comorbidities as clinically warranted. For lung cancer screening, adhere to Lung-RADS guidelines. Reference: Radiology. 2017; 284(1):228-43.   Electronically Signed By: Ronney Asters M.D. On: 01/30/2022 16:15   Assessment & Plan:    1. Benign prostatic hyperplasia with urinary obstruction *** - Urinalysis, Routine w reflex microscopic  2. Urinary retention ***  3. Epididymal cyst We discussed observation versus surgical excision and the patient elects for observation   No follow-ups on file.  Nicolette Bang, MD  [facility]

## 2022-05-31 ENCOUNTER — Encounter: Payer: Self-pay | Admitting: Urology

## 2022-05-31 NOTE — Patient Instructions (Signed)

## 2022-06-03 DIAGNOSIS — H25811 Combined forms of age-related cataract, right eye: Secondary | ICD-10-CM | POA: Diagnosis not present

## 2022-06-03 DIAGNOSIS — H35031 Hypertensive retinopathy, right eye: Secondary | ICD-10-CM | POA: Diagnosis not present

## 2022-06-03 DIAGNOSIS — Z961 Presence of intraocular lens: Secondary | ICD-10-CM | POA: Diagnosis not present

## 2022-06-03 DIAGNOSIS — H524 Presbyopia: Secondary | ICD-10-CM | POA: Diagnosis not present

## 2022-06-14 DIAGNOSIS — Z23 Encounter for immunization: Secondary | ICD-10-CM | POA: Diagnosis not present

## 2022-06-15 DIAGNOSIS — M62830 Muscle spasm of back: Secondary | ICD-10-CM | POA: Diagnosis not present

## 2022-06-15 DIAGNOSIS — M9903 Segmental and somatic dysfunction of lumbar region: Secondary | ICD-10-CM | POA: Diagnosis not present

## 2022-06-15 DIAGNOSIS — M9902 Segmental and somatic dysfunction of thoracic region: Secondary | ICD-10-CM | POA: Diagnosis not present

## 2022-06-15 DIAGNOSIS — M9905 Segmental and somatic dysfunction of pelvic region: Secondary | ICD-10-CM | POA: Diagnosis not present

## 2022-07-14 DIAGNOSIS — M170 Bilateral primary osteoarthritis of knee: Secondary | ICD-10-CM | POA: Diagnosis not present

## 2022-07-21 DIAGNOSIS — M170 Bilateral primary osteoarthritis of knee: Secondary | ICD-10-CM | POA: Diagnosis not present

## 2022-07-28 DIAGNOSIS — M170 Bilateral primary osteoarthritis of knee: Secondary | ICD-10-CM | POA: Diagnosis not present

## 2022-08-08 DIAGNOSIS — M65322 Trigger finger, left index finger: Secondary | ICD-10-CM | POA: Diagnosis not present

## 2022-08-08 DIAGNOSIS — M65331 Trigger finger, right middle finger: Secondary | ICD-10-CM | POA: Diagnosis not present

## 2022-08-08 DIAGNOSIS — M79641 Pain in right hand: Secondary | ICD-10-CM | POA: Diagnosis not present

## 2022-08-08 DIAGNOSIS — M79642 Pain in left hand: Secondary | ICD-10-CM | POA: Diagnosis not present

## 2022-08-10 ENCOUNTER — Ambulatory Visit: Payer: Medicare PPO | Admitting: Family Medicine

## 2022-08-10 VITALS — BP 124/64 | Ht 77.0 in | Wt 210.0 lb

## 2022-08-10 DIAGNOSIS — M79605 Pain in left leg: Secondary | ICD-10-CM | POA: Diagnosis not present

## 2022-08-10 NOTE — Patient Instructions (Signed)
You are getting lateral gastrocnemius (calf) tightness. Strengthening is very important to help this go away. Do home exercises daily as directed. Heat 15 minutes at a time as needed. Tylenol if needed for pain. Consider compression sleeve on your calf as well. Follow up with me in 6 weeks. Consider formal physical therapy if not improving.

## 2022-08-10 NOTE — Progress Notes (Cosign Needed)
  SUBJECTIVE:   Chief Complaint: left leg pain  History of Presenting Illness:   Micheal Baker is a 76 year old male with a past medical history of L medial-lateral meniscal tear and osteoarthritis who presents with left leg pain.  He states that the pain started several years ago and has been occurring intermittently since then. It is located in the posterolateral aspect of his left upper calf and extends proximally to several inches above the knee. Worsens with sharp exacerbating movements and dull or achy at rest, though pain is not always present. Does not disrupt his sleep. He exercises regularly with elliptical and walking. Denies redness, swelling, numbness, tingling, and radiation into the back. He performs self-massage regularly, which seems to help. Neither acetaminophen or topical diclofenac seem to have provided any significant relief.  Of note, he was hospitalized for sepsis in 01-2022 and has gradually started working toward his previous level of physical activity.    Pertinent Medical History:   Past Medical History:  Diagnosis Date   Acute medial meniscus tear of left knee    Depression    Frequent PVCs    Hypertension    Hypothyroidism    NSVT (nonsustained ventricular tachycardia) (HCC)    Osteoarthritis    Thyroid disease       OBJECTIVE:   BP 124/64   Ht '6\' 5"'$  (1.956 m)   Wt 210 lb (95.3 kg)   BMI 24.90 kg/m   Physical Examination:  Left knee No erythema or swelling, medial calf slightly smaller compared to R side, RoM full with extension and flexion to ~150, no valgus or varus laxity, strength 5/5 with flexion-extension, sensation to light touch intact across dermatomes L4-S1, McMurray test negative, Thessaly test negative, Lachman test negative   ASSESSMENT/PLAN:   Patient's presentation is most consistent with gastrocnemius spasms secondary to muscle weakness and tightness.  > Strengthening exercises targeting gastrocnemius-soleus complex >  Acetaminophen and heat as needed for pain relief > Consider compression sleeve for calf > Follow-up at [facility] in about six weeks > Consider physical therapy at next visit if pain and weakness persist    There are no diagnoses linked to this encounter.  No follow-ups on file.  Roswell Nickel, MD  08/10/2022, 3:46 PM

## 2022-08-11 ENCOUNTER — Encounter: Payer: Self-pay | Admitting: Family Medicine

## 2022-09-07 DIAGNOSIS — M65331 Trigger finger, right middle finger: Secondary | ICD-10-CM | POA: Diagnosis not present

## 2022-09-07 DIAGNOSIS — M65322 Trigger finger, left index finger: Secondary | ICD-10-CM | POA: Diagnosis not present

## 2022-09-20 DIAGNOSIS — Z6825 Body mass index (BMI) 25.0-25.9, adult: Secondary | ICD-10-CM | POA: Diagnosis not present

## 2022-09-20 DIAGNOSIS — R1033 Periumbilical pain: Secondary | ICD-10-CM | POA: Diagnosis not present

## 2022-09-21 ENCOUNTER — Ambulatory Visit: Payer: Medicare PPO | Admitting: Family Medicine

## 2022-09-21 VITALS — BP 116/64 | Ht 77.0 in | Wt 212.0 lb

## 2022-09-21 DIAGNOSIS — M79605 Pain in left leg: Secondary | ICD-10-CM

## 2022-09-21 NOTE — Progress Notes (Cosign Needed)
  SUBJECTIVE:   CHIEF COMPLAINT / HPI:   Left Calf, Knee pain:  Patient reports that the pain in the left upper lateral calf, posterolateral knee up to the thigh is about the same. He reports that he is still exercising and doing the HEP routinely. Some days he feels no pain, some days feels twinges of pain. Getting in and out of the vehicle can be very difficult for him and cause significant pain and tightening. Patient denies calf cramping and has had no evidence of catching/locking/popping/sensations of giving out.    H/o left medial meniscectomy, H/o right knee medial and lateral meniscectomy   PERTINENT  PMH / PSH:   Past Medical History:  Diagnosis Date   Acute medial meniscus tear of left knee    Depression    Frequent PVCs    Hypertension    Hypothyroidism    NSVT (nonsustained ventricular tachycardia) (HCC)    Osteoarthritis    Thyroid disease     OBJECTIVE:  BP 116/64   Ht '6\' 5"'$  (1.956 m)   Wt 212 lb (96.2 kg)   BMI 25.14 kg/m  Physical Exam  Left Knee Exam Normal to inspection with no erythema or effusion or obvious bony abnormalities.  Palpation normal with no warmth, joint line tenderness or patellar tenderness, condyle tenderness. No tenderness overlying gastroc.  ROM normal in flexion and lower leg rotation. Unable to fully extend bilaterally missing about 15 degrees to full extension  Ligaments with solid consistent endpoints including ACL, PCL, LCL, MCL.  Negative Mcmurray's and Thessalys  Non painful patellar compression.  Patellar and quadriceps tendons unremarkable.  quadriceps strength slightly decreased at ~50 degrees of flexion with upper gastrocnemius mild fasciculations noted in isolated strength testing  Strength 5/5 with 90 degrees of flexion    ASSESSMENT/PLAN:  Left leg pain Assessment & Plan: Presence of left upper lateral calf pain and tightening radiating up the knee and thigh posterolaterally with physical examination proving mild  quadriceps weakness. With no abnormal meniscal or ligamentous findings on examination, there is more likely muscular pathology as the cause of his symptoms. HEP has proved minimal benefit, would move forward with formal physical therapy for strengthening. Will have patient return in 6 weeks, if symptoms worsening, would consider MRI for further assessment after failure of conservative treatment.  Patient has had vascular ABIs performed, which were negative with no concern for vascular claudication as possible cause.      Erskine Emery, MD 09/21/2022, 11:55 AM PGY-2, [place]

## 2022-09-21 NOTE — Patient Instructions (Signed)
Start physical therapy and do home exercises on days you don't go to therapy. Strengthening is very important to help this go away. Heat 15 minutes at a time as needed. Tylenol if needed for pain. Consider compression sleeve on your calf or knee as well. Follow up with me in 6 weeks. Consider MRI of your knee if not improving as expected.

## 2022-09-21 NOTE — Assessment & Plan Note (Addendum)
Presence of left upper lateral calf pain and tightening radiating up the knee and thigh posterolaterally with physical examination proving mild quadriceps weakness. With no abnormal meniscal or ligamentous findings on examination, there is more likely muscular pathology as the cause of his symptoms. HEP has proved minimal benefit, would move forward with formal physical therapy for strengthening. Will have patient return in 6 weeks, if symptoms worsening, would consider MRI for further assessment after failure of conservative treatment.  Patient has had vascular ABIs performed, which were negative with no concern for vascular claudication as possible cause.

## 2022-09-22 ENCOUNTER — Encounter: Payer: Self-pay | Admitting: Family Medicine

## 2022-09-26 DIAGNOSIS — M79605 Pain in left leg: Secondary | ICD-10-CM | POA: Diagnosis not present

## 2022-09-26 DIAGNOSIS — M62562 Muscle wasting and atrophy, not elsewhere classified, left lower leg: Secondary | ICD-10-CM | POA: Diagnosis not present

## 2022-09-26 DIAGNOSIS — R269 Unspecified abnormalities of gait and mobility: Secondary | ICD-10-CM | POA: Diagnosis not present

## 2022-09-26 DIAGNOSIS — M25662 Stiffness of left knee, not elsewhere classified: Secondary | ICD-10-CM | POA: Diagnosis not present

## 2022-09-29 ENCOUNTER — Encounter: Payer: Self-pay | Admitting: *Deleted

## 2022-09-30 DIAGNOSIS — R269 Unspecified abnormalities of gait and mobility: Secondary | ICD-10-CM | POA: Diagnosis not present

## 2022-09-30 DIAGNOSIS — M62562 Muscle wasting and atrophy, not elsewhere classified, left lower leg: Secondary | ICD-10-CM | POA: Diagnosis not present

## 2022-09-30 DIAGNOSIS — M25662 Stiffness of left knee, not elsewhere classified: Secondary | ICD-10-CM | POA: Diagnosis not present

## 2022-09-30 DIAGNOSIS — M79605 Pain in left leg: Secondary | ICD-10-CM | POA: Diagnosis not present

## 2022-10-03 DIAGNOSIS — M25662 Stiffness of left knee, not elsewhere classified: Secondary | ICD-10-CM | POA: Diagnosis not present

## 2022-10-03 DIAGNOSIS — M62562 Muscle wasting and atrophy, not elsewhere classified, left lower leg: Secondary | ICD-10-CM | POA: Diagnosis not present

## 2022-10-03 DIAGNOSIS — R269 Unspecified abnormalities of gait and mobility: Secondary | ICD-10-CM | POA: Diagnosis not present

## 2022-10-03 DIAGNOSIS — M79605 Pain in left leg: Secondary | ICD-10-CM | POA: Diagnosis not present

## 2022-10-05 ENCOUNTER — Other Ambulatory Visit: Payer: Medicare PPO

## 2022-10-05 DIAGNOSIS — M25662 Stiffness of left knee, not elsewhere classified: Secondary | ICD-10-CM | POA: Diagnosis not present

## 2022-10-05 DIAGNOSIS — R972 Elevated prostate specific antigen [PSA]: Secondary | ICD-10-CM | POA: Diagnosis not present

## 2022-10-05 DIAGNOSIS — M62562 Muscle wasting and atrophy, not elsewhere classified, left lower leg: Secondary | ICD-10-CM | POA: Diagnosis not present

## 2022-10-05 DIAGNOSIS — R269 Unspecified abnormalities of gait and mobility: Secondary | ICD-10-CM | POA: Diagnosis not present

## 2022-10-05 DIAGNOSIS — M79605 Pain in left leg: Secondary | ICD-10-CM | POA: Diagnosis not present

## 2022-10-06 LAB — PSA: Prostate Specific Ag, Serum: 6.3 ng/mL — ABNORMAL HIGH (ref 0.0–4.0)

## 2022-10-07 ENCOUNTER — Encounter: Payer: Self-pay | Admitting: Urology

## 2022-10-07 ENCOUNTER — Ambulatory Visit: Payer: Medicare PPO | Admitting: Urology

## 2022-10-07 VITALS — BP 115/73 | HR 67

## 2022-10-07 DIAGNOSIS — N401 Enlarged prostate with lower urinary tract symptoms: Secondary | ICD-10-CM

## 2022-10-07 DIAGNOSIS — N138 Other obstructive and reflux uropathy: Secondary | ICD-10-CM | POA: Diagnosis not present

## 2022-10-07 DIAGNOSIS — R972 Elevated prostate specific antigen [PSA]: Secondary | ICD-10-CM

## 2022-10-07 DIAGNOSIS — R351 Nocturia: Secondary | ICD-10-CM | POA: Diagnosis not present

## 2022-10-07 LAB — URINALYSIS, ROUTINE W REFLEX MICROSCOPIC
Bilirubin, UA: NEGATIVE
Glucose, UA: NEGATIVE
Leukocytes,UA: NEGATIVE
Nitrite, UA: NEGATIVE
Protein,UA: NEGATIVE
RBC, UA: NEGATIVE
Specific Gravity, UA: 1.01 (ref 1.005–1.030)
Urobilinogen, Ur: 1 mg/dL (ref 0.2–1.0)
pH, UA: 7 (ref 5.0–7.5)

## 2022-10-07 MED ORDER — SILODOSIN 8 MG PO CAPS: 8.0000 mg | ORAL_CAPSULE | Freq: Every day | ORAL | 11 refills | Status: DC

## 2022-10-07 NOTE — Patient Instructions (Signed)

## 2022-10-07 NOTE — Progress Notes (Unsigned)
10/07/2022 12:09 PM   Glade Nurse 08-Oct-1945 [id number]  Referring provider: [facility] [address] [place],  [place] [address]  No chief complaint on file.   HPI: PSA decreased to 6.3 from 9.9. IPSS 11 QOL 3 on flomax BID. Urine stream is fair. Intermtitent straining to urinate. Nocturia 3x. No testicular pain   PMH: Past Medical History:  Diagnosis Date   Acute medial meniscus tear of left knee    Depression    Frequent PVCs    Hypertension    Hypothyroidism    NSVT (nonsustained ventricular tachycardia) (HCC)    Osteoarthritis    Thyroid disease     Surgical History: Past Surgical History:  Procedure Laterality Date   CATARACT EXTRACTION     left   COLONOSCOPY N/A 04/08/2016   Procedure: COLONOSCOPY;  Surgeon: Danie Binder, MD;  [facility];  Service: Endoscopy;  Laterality: N/A;  11:15 AM   EYE SURGERY     left-scar tissue   groin surgery  2010   growth removed    KNEE ARTHROSCOPY     left   KNEE ARTHROSCOPY WITH LATERAL MENISECTOMY Right 03/11/2015   Procedure: RIGHT KNEE ARTHROSCOPY WITH MEDIALAND LATERAL MENISECTOMY;  Surgeon: Elsie Saas, MD;  Location: [place];  Service: Orthopedics;  Laterality: Right;   KNEE ARTHROSCOPY WITH MEDIAL MENISECTOMY Left 06/27/2014   Procedure: LEFT KNEE ARTHROSCOPY WITH PARTIAL MEDIAL AND LATERAL MENISECTOMIES AND CHONDROPLASTY;  Surgeon: Lorn Junes, MD;  Location: [place];  Service: Orthopedics;  Laterality: Left;   KNEE ARTHROSCOPY WITH MEDIAL MENISECTOMY Right 03/11/2015   Procedure: KNEE ARTHROSCOPY WITH MEDIAL MENISECTOMY;  Surgeon: Elsie Saas, MD;  Location: [place];  Service: Orthopedics;  Laterality: Right;   LEFT HEART CATHETERIZATION WITH CORONARY ANGIOGRAM N/A 09/14/2011   Procedure: LEFT HEART CATHETERIZATION WITH CORONARY ANGIOGRAM;  Surgeon: Pixie Casino, MD;  Location: [facility] CATH LAB;  Service:  Cardiovascular;  Laterality: N/A;   POLYPECTOMY  04/08/2016   Procedure: POLYPECTOMY;  Surgeon: Danie Binder, MD;  [facility];  Service: Endoscopy;;  colon    RETINAL DETACHMENT SURGERY Left 2002   Dr. Merlene Morse    Home Medications:  Allergies as of 10/07/2022       Reactions   Avelox [moxifloxacin] Other (See Comments)   Caused tendonitis   Penicillins Other (See Comments)   Chills        Medication List        Accurate as of October 07, 2022 12:09 PM. If you have any questions, ask your nurse or doctor.          acetaminophen 500 MG tablet Commonly known as: TYLENOL Take 500-1,000 mg by mouth every 6 (six) hours as needed for mild pain or headache.   ALPHA LIPOIC ACID PO Take 1 capsule by mouth daily.   B-complex with vitamin C tablet Take 1 tablet by mouth in the morning.   celecoxib 200 MG capsule Commonly known as: CELEBREX Take 200 mg by mouth daily as needed for mild pain.   cyclobenzaprine 10 MG tablet Commonly known as: FLEXERIL Take 10 mg by mouth 3 (three) times daily as needed for muscle spasms.   escitalopram 20 MG tablet Commonly known as: LEXAPRO Take 20 mg by mouth daily.   fluticasone 50 MCG/ACT nasal spray Commonly known as: FLONASE Place into both nostrils.   levocetirizine 5 MG tablet Commonly known as: XYZAL Take 5 mg  by mouth at bedtime.   levothyroxine 75 MCG tablet Commonly known as: SYNTHROID Take 1 tablet by mouth daily before breakfast.   meloxicam 15 MG tablet Commonly known as: MOBIC Take 1 tablet (15 mg total) by mouth daily.   pantoprazole 40 MG tablet Commonly known as: PROTONIX Take 40 mg by mouth daily.   tamsulosin 0.4 MG Caps capsule Commonly known as: FLOMAX Take 1 capsule (0.4 mg total) by mouth in the morning and at bedtime.   vitamin C 100 MG tablet Take 100 mg by mouth daily.   Vitamin D3 50 MCG (2000 UT) Tabs Take 2,000 Units by mouth daily in the afternoon.        Allergies:   Allergies  Allergen Reactions   Avelox [Moxifloxacin] Other (See Comments)    Caused tendonitis   Penicillins Other (See Comments)    Chills     Family History: Family History  Problem Relation Age of Onset   Hypertension Mother    Hypertension Father    Heart attack Father    Diabetes Father    Asthma Daughter     Social History:  reports that he has never smoked. He has never used smokeless tobacco. He reports current alcohol use. He reports that he does not use drugs.  ROS: All other review of systems were reviewed and are negative except what is noted above in HPI  Physical Exam: BP 115/73   Pulse 67   Constitutional:  Alert and oriented, No acute distress. HEENT: [place] AT, moist mucus membranes.  Trachea midline, no masses. Cardiovascular: No clubbing, cyanosis, or edema. Respiratory: Normal respiratory effort, no increased work of breathing. GI: Abdomen is soft, nontender, nondistended, no abdominal masses GU: No CVA tenderness.  Lymph: No cervical or inguinal lymphadenopathy. Skin: No rashes, bruises or suspicious lesions. Neurologic: Grossly intact, no focal deficits, moving all 4 extremities. Psychiatric: Normal mood and affect.  Laboratory Data: Lab Results  Component Value Date   WBC 10.5 02/01/2022   HGB 12.3 (L) 02/01/2022   HCT 36.7 (L) 02/01/2022   MCV 89.5 02/01/2022   PLT 153 02/01/2022    Lab Results  Component Value Date   CREATININE 0.84 02/02/2022    No results found for: "PSA"  No results found for: "TESTOSTERONE"  No results found for: "HGBA1C"  Urinalysis    Component Value Date/Time   COLORURINE STRAW (A) 02/03/2022 0036   APPEARANCEUR Clear 04/01/2022 1241   LABSPEC 1.005 02/03/2022 0036   PHURINE 7.0 02/03/2022 0036   GLUCOSEU Negative 04/01/2022 1241   HGBUR NEGATIVE 02/03/2022 0036   BILIRUBINUR Negative 04/01/2022 1241   KETONESUR 20 (A) 02/03/2022 0036   PROTEINUR Negative 04/01/2022 1241   PROTEINUR NEGATIVE  02/03/2022 0036   UROBILINOGEN 0.2 08/08/2011 0800   NITRITE Negative 04/01/2022 1241   NITRITE NEGATIVE 02/03/2022 0036   LEUKOCYTESUR Negative 04/01/2022 1241   LEUKOCYTESUR NEGATIVE 02/03/2022 0036    Lab Results  Component Value Date   LABMICR Comment 04/01/2022   WBCUA 6-10 (A) 02/15/2022   LABEPIT 0-10 02/15/2022   MUCUS Present 02/15/2022   BACTERIA None seen 02/15/2022    Pertinent Imaging: *** No results found for this or any previous visit.  No results found for this or any previous visit.  No results found for this or any previous visit.  No results found for this or any previous visit.  No results found for this or any previous visit.  No valid procedures specified. No results found for this or any  previous visit.  Results for orders placed during the hospital encounter of 01/30/22  CT Renal Stone Study  Narrative CLINICAL DATA:  Flank pain.  EXAM: CT ABDOMEN AND PELVIS WITHOUT CONTRAST  TECHNIQUE: Multidetector CT imaging of the abdomen and pelvis was performed following the standard protocol without IV contrast.  RADIATION DOSE REDUCTION: This exam was performed according to the departmental dose-optimization program which includes automated exposure control, adjustment of the mA and/or kV according to patient size and/or use of iterative reconstruction technique.  COMPARISON:  CT abdomen and pelvis 01/18/2007  FINDINGS: Lower chest: There is a new 6 mm nodule in the left lung base. Lung bases are otherwise clear. There are calcified granulomas in the lung bases.  Hepatobiliary: Calcified granulomas are present. There is a 3 cm cyst in the inferior right lobe of the liver which has slightly increased in size compared to the prior study. Gallbladder and bile ducts are within normal limits.  Pancreas: Unremarkable. No pancreatic ductal dilatation or surrounding inflammatory changes.  Spleen: Calcified granulomas are present. Otherwise  within normal limits.  Adrenals/Urinary Tract: Foley catheter is seen within the bladder. No urinary tract calculus or hydronephrosis. Adrenal glands within normal limits.  Stomach/Bowel: Stomach is within normal limits. Appendix appears normal. No evidence of bowel wall thickening, distention, or inflammatory changes. Appendix is not seen. Small hiatal hernia and duodenal diverticulum again noted.  Vascular/Lymphatic: Aortic atherosclerosis. No enlarged abdominal or pelvic lymph nodes.  Reproductive: Prostate gland is enlarged and lobulated with nodular protrusion into the bladder base. Prostate measures 7.0 x 7.3 by 5.9 cm and has significantly increased in size.  Other: No ascites.  Small fat containing inguinal hernias.  Musculoskeletal: No acute or significant osseous findings.  IMPRESSION: 1. No evidence for urinary tract calculus or hydronephrosis. 2. Foley catheter in the bladder. 3. Marked enlargement of the prostate gland with nodular protrusion into the bladder base, new from 2008. Recommend clinical correlation and follow-up to exclude neoplasm. 4. Hepatic cyst. 5. Old granulomatous disease. 6. 6 mm left solid pulmonary nodule. Recommend a non-contrast Chest CT at 6-12 months. If patient is high risk for malignancy, recommend an additional non-contrast Chest CT at 18-24 months; if patient is low risk for malignancy a non-contrast Chest CT at 18-24 months is optional. These guidelines do not apply to immunocompromised patients and patients with cancer. Follow up in patients with significant comorbidities as clinically warranted. For lung cancer screening, adhere to Lung-RADS guidelines. Reference: Radiology. 2017; 284(1):228-43.   Electronically Signed By: Ronney Asters M.D. On: 01/30/2022 16:15   Assessment & Plan:    1. Benign prostatic hyperplasia with urinary obstruction *** - Urinalysis, Routine w reflex microscopic  2. Nocturia ***  3.  Elevated PSA ***   No follow-ups on file.  Nicolette Bang, MD  [facility] [place]

## 2022-10-10 DIAGNOSIS — M62562 Muscle wasting and atrophy, not elsewhere classified, left lower leg: Secondary | ICD-10-CM | POA: Diagnosis not present

## 2022-10-10 DIAGNOSIS — M79605 Pain in left leg: Secondary | ICD-10-CM | POA: Diagnosis not present

## 2022-10-10 DIAGNOSIS — M25662 Stiffness of left knee, not elsewhere classified: Secondary | ICD-10-CM | POA: Diagnosis not present

## 2022-10-10 DIAGNOSIS — R269 Unspecified abnormalities of gait and mobility: Secondary | ICD-10-CM | POA: Diagnosis not present

## 2022-10-13 DIAGNOSIS — R269 Unspecified abnormalities of gait and mobility: Secondary | ICD-10-CM | POA: Diagnosis not present

## 2022-10-13 DIAGNOSIS — M62562 Muscle wasting and atrophy, not elsewhere classified, left lower leg: Secondary | ICD-10-CM | POA: Diagnosis not present

## 2022-10-13 DIAGNOSIS — M25662 Stiffness of left knee, not elsewhere classified: Secondary | ICD-10-CM | POA: Diagnosis not present

## 2022-10-13 DIAGNOSIS — M79605 Pain in left leg: Secondary | ICD-10-CM | POA: Diagnosis not present

## 2022-10-17 DIAGNOSIS — M79605 Pain in left leg: Secondary | ICD-10-CM | POA: Diagnosis not present

## 2022-10-17 DIAGNOSIS — L820 Inflamed seborrheic keratosis: Secondary | ICD-10-CM | POA: Diagnosis not present

## 2022-10-17 DIAGNOSIS — M62562 Muscle wasting and atrophy, not elsewhere classified, left lower leg: Secondary | ICD-10-CM | POA: Diagnosis not present

## 2022-10-17 DIAGNOSIS — L570 Actinic keratosis: Secondary | ICD-10-CM | POA: Diagnosis not present

## 2022-10-17 DIAGNOSIS — X32XXXD Exposure to sunlight, subsequent encounter: Secondary | ICD-10-CM | POA: Diagnosis not present

## 2022-10-17 DIAGNOSIS — R269 Unspecified abnormalities of gait and mobility: Secondary | ICD-10-CM | POA: Diagnosis not present

## 2022-10-17 DIAGNOSIS — M25662 Stiffness of left knee, not elsewhere classified: Secondary | ICD-10-CM | POA: Diagnosis not present

## 2022-10-19 DIAGNOSIS — M79605 Pain in left leg: Secondary | ICD-10-CM | POA: Diagnosis not present

## 2022-10-19 DIAGNOSIS — R269 Unspecified abnormalities of gait and mobility: Secondary | ICD-10-CM | POA: Diagnosis not present

## 2022-10-19 DIAGNOSIS — M62562 Muscle wasting and atrophy, not elsewhere classified, left lower leg: Secondary | ICD-10-CM | POA: Diagnosis not present

## 2022-10-19 DIAGNOSIS — M25662 Stiffness of left knee, not elsewhere classified: Secondary | ICD-10-CM | POA: Diagnosis not present

## 2022-10-24 DIAGNOSIS — M79605 Pain in left leg: Secondary | ICD-10-CM | POA: Diagnosis not present

## 2022-10-24 DIAGNOSIS — M25662 Stiffness of left knee, not elsewhere classified: Secondary | ICD-10-CM | POA: Diagnosis not present

## 2022-10-24 DIAGNOSIS — M62562 Muscle wasting and atrophy, not elsewhere classified, left lower leg: Secondary | ICD-10-CM | POA: Diagnosis not present

## 2022-10-24 DIAGNOSIS — R269 Unspecified abnormalities of gait and mobility: Secondary | ICD-10-CM | POA: Diagnosis not present

## 2022-10-26 NOTE — Progress Notes (Unsigned)
Referring Provider:  Sharilyn Sites, MD Primary Care Physician:  Sharilyn Sites, MD Primary Gastroenterologist:  Dr. Oneida Alar previously. Now establishing with Dr. Abbey Chatters  No chief complaint on file.   HPI:   Micheal Baker is a 77 y.o. male with history of depression, PVCs, NSVT, HTN, hypothyroidism, BPH, presenting today at the request of Dr. Hilma Favors for consult colonoscopy. Recommended OV due to age.   Today:     Last colonoscopy 04/08/2016 with one 6 mm polyp in the cecum removed, one 4 mm polyp in the descending colon removed, nonbleeding internal hemorrhoids.  Pathology with hyperplastic polyps.  Recommended colonoscopy in 10 years.      Past Medical History:  Diagnosis Date   Acute medial meniscus tear of left knee    Depression    Frequent PVCs    Hypertension    Hypothyroidism    NSVT (nonsustained ventricular tachycardia) (HCC)    Osteoarthritis    Thyroid disease     Past Surgical History:  Procedure Laterality Date   CATARACT EXTRACTION     left   COLONOSCOPY N/A 04/08/2016   Procedure: COLONOSCOPY;  Surgeon: Danie Binder, MD;  [facility];  Service: Endoscopy;  Laterality: N/A;  11:15 AM   EYE SURGERY     left-scar tissue   groin surgery  2010   growth removed    KNEE ARTHROSCOPY     left   KNEE ARTHROSCOPY WITH LATERAL MENISECTOMY Right 03/11/2015   Procedure: RIGHT KNEE ARTHROSCOPY WITH MEDIALAND LATERAL MENISECTOMY;  Surgeon: Elsie Saas, MD;  Location: [place];  Service: Orthopedics;  Laterality: Right;   KNEE ARTHROSCOPY WITH MEDIAL MENISECTOMY Left 06/27/2014   Procedure: LEFT KNEE ARTHROSCOPY WITH PARTIAL MEDIAL AND LATERAL MENISECTOMIES AND CHONDROPLASTY;  Surgeon: Lorn Junes, MD;  Location: [place];  Service: Orthopedics;  Laterality: Left;   KNEE ARTHROSCOPY WITH MEDIAL MENISECTOMY Right 03/11/2015   Procedure: KNEE ARTHROSCOPY WITH MEDIAL MENISECTOMY;  Surgeon: Elsie Saas, MD;  Location:  [place];  Service: Orthopedics;  Laterality: Right;   LEFT HEART CATHETERIZATION WITH CORONARY ANGIOGRAM N/A 09/14/2011   Procedure: LEFT HEART CATHETERIZATION WITH CORONARY ANGIOGRAM;  Surgeon: Pixie Casino, MD;  Location: [facility];  Service: Cardiovascular;  Laterality: N/A;   POLYPECTOMY  04/08/2016   Procedure: POLYPECTOMY;  Surgeon: Danie Binder, MD;  [facility];  Service: Endoscopy;;  colon    RETINAL DETACHMENT SURGERY Left 2002   Dr. Merlene Morse    Current Outpatient Medications  Medication Sig Dispense Refill   acetaminophen (TYLENOL) 500 MG tablet Take 500-1,000 mg by mouth every 6 (six) hours as needed for mild pain or headache.     ALPHA LIPOIC ACID PO Take 1 capsule by mouth daily.     Ascorbic Acid (VITAMIN C) 100 MG tablet Take 100 mg by mouth daily.     B Complex-C (B-COMPLEX WITH VITAMIN C) tablet Take 1 tablet by mouth in the morning.     celecoxib (CELEBREX) 200 MG capsule Take 200 mg by mouth daily as needed for mild pain.     Cholecalciferol (VITAMIN D3) 50 MCG (2000 UT) TABS Take 2,000 Units by mouth daily in the afternoon.     cyclobenzaprine (FLEXERIL) 10 MG tablet Take 10 mg by mouth 3 (three) times daily as needed for muscle spasms.     escitalopram (LEXAPRO) 20 MG tablet Take 20 mg by mouth daily.     fluticasone (FLONASE) 50 MCG/ACT  nasal spray Place into both nostrils.     levocetirizine (XYZAL) 5 MG tablet Take 5 mg by mouth at bedtime.     levothyroxine (SYNTHROID, LEVOTHROID) 75 MCG tablet Take 1 tablet by mouth daily before breakfast.     meloxicam (MOBIC) 15 MG tablet Take 1 tablet (15 mg total) by mouth daily. 30 tablet 3   pantoprazole (PROTONIX) 40 MG tablet Take 40 mg by mouth daily.     silodosin (RAPAFLO) 8 MG CAPS capsule Take 1 capsule (8 mg total) by mouth at bedtime. 30 capsule 11   tamsulosin (FLOMAX) 0.4 MG CAPS capsule Take 1 capsule (0.4 mg total) by mouth in the morning and at bedtime. 60 capsule 11   No  current facility-administered medications for this visit.    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  Lives alone in a one story home.  Has 2 children.  Retired from [employer].  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  Education: Oceanographer.     Social Determinants of Health   Financial Resource Strain: Not on file  Food Insecurity: Not on file  Transportation Needs: Not on file  Physical Activity: Not on file  Stress: Not on file  Social Connections: Not on file  Intimate Partner Violence: Not on file    Review of Systems: Gen: Denies any fever, chills, fatigue, weight loss, lack of appetite.  CV: Denies chest pain, heart palpitations, peripheral edema, syncope.  Resp: Denies shortness of breath at rest or with exertion. Denies wheezing or cough.  GI: Denies dysphagia or odynophagia. Denies  jaundice, hematemesis, fecal incontinence. GU : Denies urinary burning, urinary frequency, urinary hesitancy MS: Denies joint pain, muscle weakness, cramps, or limitation of movement.  Derm: Denies rash, itching, dry skin Psych: Denies depression, anxiety, memory loss, and confusion Heme: Denies bruising, bleeding, and enlarged lymph nodes.  Physical Exam: There were no vitals taken for this visit. General:   Alert and oriented. Pleasant and cooperative. Well-nourished and well-developed.  Head:  Normocephalic and atraumatic. Eyes:  Without icterus, sclera clear and conjunctiva pink.  Ears:  Normal auditory acuity. Lungs:  Clear to auscultation bilaterally. No wheezes, rales, or rhonchi. No distress.  Heart:  S1, S2  present without murmurs appreciated.  Abdomen:  +BS, soft, non-tender and non-distended. No HSM noted. No guarding or rebound. No masses appreciated.  Rectal:  Deferred  Msk:  Symmetrical without gross deformities. Normal posture. Extremities:  Without edema. Neurologic:  Alert and  oriented x4;  grossly normal neurologically. Skin:  Intact without significant lesions or rashes. Psych:  Alert and cooperative. Normal mood and affect.    Assessment:     Plan:  ***   Aliene Altes, PA-C [facility] 10/27/2022

## 2022-10-27 ENCOUNTER — Telehealth (INDEPENDENT_AMBULATORY_CARE_PROVIDER_SITE_OTHER): Payer: Medicare PPO | Admitting: Gastroenterology

## 2022-10-27 ENCOUNTER — Telehealth: Payer: Self-pay | Admitting: Gastroenterology

## 2022-10-27 ENCOUNTER — Encounter: Payer: Self-pay | Admitting: Gastroenterology

## 2022-10-27 VITALS — Ht 77.0 in | Wt 212.0 lb

## 2022-10-27 DIAGNOSIS — R195 Other fecal abnormalities: Secondary | ICD-10-CM | POA: Insufficient documentation

## 2022-10-27 DIAGNOSIS — K5900 Constipation, unspecified: Secondary | ICD-10-CM | POA: Diagnosis not present

## 2022-10-27 DIAGNOSIS — R1010 Upper abdominal pain, unspecified: Secondary | ICD-10-CM | POA: Insufficient documentation

## 2022-10-27 NOTE — Telephone Encounter (Signed)
Mindy/Tammy: Please arrange EGD and colonoscopy with propofol with Dr. Abbey Chatters. ASA 3 due to age. Dx: Change in stool caliber, constipation, upper abdominal pain.  Recommend extra 1/2 colon prep due to constipation.

## 2022-10-27 NOTE — Patient Instructions (Signed)
We will arrange for you to have an upper endoscopy and colonoscopy in the near future with Dr. Abbey Chatters.  Continue taking pantoprazole 40 mg daily.  Avoid NSAID products is much as possible.  This includes ibuprofen, Aleve, Advil, naproxen, BC powders, Goody powders, meloxicam, and anything that says "NSAID" on the package.  Stop stool softeners for now.  Start MiraLAX 1 capful (17 g) daily in 8 ounces of water for constipation.  After couple of weeks, if you continue to have constipation, incomplete bowel movements, straining, please let me know.  Try to drink at least 64 ounces of water daily.  Consume plenty of fiber through fruits, vegetables, whole grains.  We will plan to follow-up with you in the office after your procedures.  Do not hesitate to call if you have questions or concerns prior to your next visit.  It was a pleasure to meet you today! I want to create trusting relationships with patients. If you receive a survey regarding your visit,  I greatly appreciate you taking time to fill this out on paper or through your MyChart. I value your feedback.  Aliene Altes, PA-C [facility]

## 2022-10-28 NOTE — Telephone Encounter (Signed)
Called pt to schedule. He stated he wasn't at home right now and will call Monday to schedule

## 2022-10-31 DIAGNOSIS — R269 Unspecified abnormalities of gait and mobility: Secondary | ICD-10-CM | POA: Diagnosis not present

## 2022-10-31 DIAGNOSIS — M25662 Stiffness of left knee, not elsewhere classified: Secondary | ICD-10-CM | POA: Diagnosis not present

## 2022-10-31 DIAGNOSIS — M62562 Muscle wasting and atrophy, not elsewhere classified, left lower leg: Secondary | ICD-10-CM | POA: Diagnosis not present

## 2022-10-31 DIAGNOSIS — M79605 Pain in left leg: Secondary | ICD-10-CM | POA: Diagnosis not present

## 2022-10-31 NOTE — Telephone Encounter (Signed)
Pt called and left vm wanting to schedule procedures.  [place]

## 2022-11-01 ENCOUNTER — Encounter: Payer: Self-pay | Admitting: *Deleted

## 2022-11-01 ENCOUNTER — Other Ambulatory Visit: Payer: Self-pay | Admitting: *Deleted

## 2022-11-01 MED ORDER — PEG 3350-KCL-NA BICARB-NACL 420 G PO SOLR: 4000.0000 mL | Freq: Once | ORAL | 0 refills | Status: AC

## 2022-11-01 MED ORDER — PEG 3350-KCL-NA BICARB-NACL 420 G PO SOLR: 4000.0000 mL | Freq: Once | ORAL | 0 refills | Status: DC

## 2022-11-01 NOTE — Telephone Encounter (Signed)
Pt has been scheduled for 12/01/22. Instructions mailed and prep sent to the pharmacy

## 2022-11-02 ENCOUNTER — Ambulatory Visit: Payer: Medicare PPO | Admitting: Family Medicine

## 2022-11-02 ENCOUNTER — Telehealth: Payer: Self-pay | Admitting: *Deleted

## 2022-11-02 VITALS — BP 136/92 | Ht 77.0 in | Wt 212.0 lb

## 2022-11-02 DIAGNOSIS — M25562 Pain in left knee: Secondary | ICD-10-CM

## 2022-11-02 NOTE — Progress Notes (Signed)
  Micheal Baker - 77 y.o. male MRN [id number]  Date of birth: 1946-02-01  SUBJECTIVE:  Including CC & ROS.  CC: Follow up left knee pain  HPI: Patient is presenting for follow up of left knee pain. He was previously seen 09/21/22 for left calf pain as well, which he states has improved with physical therapy. However he continues to have pain in the posterolateral knee, now also having some in the anteromedial and lateral knee. Notes it most with walking, getting in and out of his car, and exercises in PT requiring him to be on one foot. He has received gel injections for his knees recently, last this past winter. He states he received x-rays of his knees around that time and was told they looked good. Denies new trauma, swelling, or weakness.   HISTORY: Past Medical, Surgical, Social, and Family History Reviewed & Updated per EMR.   Pertinent Historical Findings include: History of left meniscectomy x2, last ~2017  PHYSICAL EXAM:  VS: BP:(!) 136/92  HR: bpm  TEMP: ( )  RESP:   HT:6\' 5"  (195.6 cm)   WT:212 lb (96.2 kg)  BMI:25.13 PHYSICAL EXAM: Left Knee: - Inspection: no gross deformity. No swelling/effusion, erythema or bruising. Skin intact - Palpation: TTP to anteromedial knee.  - ROM: full active ROM with flexion and extension in knee and hip - Strength: 5/5 strength in all planes - Neuro/vasc: NV intact - Special Tests: - LIGAMENTS: negative anterior and posterior drawer, negative Lachman's, no MCL or LCL laxity  -- MENISCUS: negative McMurray's, positive Thessaly  -- PF JOINT: nml patellar mobility bilaterally  Hips: normal ROM, negative FABER and FADIR bilaterally   ASSESSMENT & PLAN: See problem based charting & AVS for pt instructions.  L knee pain - Calf pain has resolved with PT, however patient continues to have knee pain with movement. Given history of meniscal surgery, continued pain despite PT, and physical exam, likely has meniscus pathology. Strength and stability  testing otherwise normal. Patient states he has received XR of knees recently, with some underlying arthopathy.  - MRI Left knee - Follow up with results - Can transition to home exercises   Paulino Door, MS4 [facility]

## 2022-11-02 NOTE — Telephone Encounter (Signed)
Spoke to pt, informed him of recommendations. Pt voiced understanding.

## 2022-11-02 NOTE — Patient Instructions (Addendum)
We will go ahead with an MRI of your knee. I'll call you with results and next steps. Continue your home exercises in the meantime.  [place] [facility] [address] Radiology [place],  [place]  [address] Main: [phone]

## 2022-11-02 NOTE — Telephone Encounter (Signed)
Yes, gas-x is fine to take.

## 2022-11-02 NOTE — Telephone Encounter (Signed)
Pt called and states he has a lot of gas. Was wondering if it is okay to take Gas-X?

## 2022-11-03 ENCOUNTER — Encounter: Payer: Self-pay | Admitting: Family Medicine

## 2022-11-18 DIAGNOSIS — E663 Overweight: Secondary | ICD-10-CM | POA: Diagnosis not present

## 2022-11-18 DIAGNOSIS — I10 Essential (primary) hypertension: Secondary | ICD-10-CM | POA: Diagnosis not present

## 2022-11-18 DIAGNOSIS — Z6825 Body mass index (BMI) 25.0-25.9, adult: Secondary | ICD-10-CM | POA: Diagnosis not present

## 2022-11-18 DIAGNOSIS — N400 Enlarged prostate without lower urinary tract symptoms: Secondary | ICD-10-CM | POA: Diagnosis not present

## 2022-11-18 DIAGNOSIS — J0100 Acute maxillary sinusitis, unspecified: Secondary | ICD-10-CM | POA: Diagnosis not present

## 2022-11-18 DIAGNOSIS — F320 Major depressive disorder, single episode, mild: Secondary | ICD-10-CM | POA: Diagnosis not present

## 2022-11-25 ENCOUNTER — Encounter (HOSPITAL_COMMUNITY): Payer: Self-pay

## 2022-11-25 ENCOUNTER — Ambulatory Visit (HOSPITAL_COMMUNITY): Payer: Medicare PPO

## 2022-11-29 ENCOUNTER — Other Ambulatory Visit (HOSPITAL_COMMUNITY): Payer: Medicare PPO

## 2022-11-29 ENCOUNTER — Encounter (HOSPITAL_COMMUNITY)
Admission: RE | Admit: 2022-11-29 | Discharge: 2022-11-29 | Disposition: A | Discharge status: Home / Self Care | Payer: Medicare PPO | Source: Ambulatory Visit | Attending: Internal Medicine | Admitting: Internal Medicine

## 2022-11-29 HISTORY — DX: Gastro-esophageal reflux disease without esophagitis: K21.9

## 2022-11-30 ENCOUNTER — Other Ambulatory Visit: Payer: Self-pay

## 2022-11-30 ENCOUNTER — Encounter (HOSPITAL_COMMUNITY): Payer: Self-pay

## 2022-12-01 ENCOUNTER — Ambulatory Visit (HOSPITAL_COMMUNITY)
Admission: RE | Admit: 2022-12-01 | Discharge: 2022-12-01 | Disposition: A | Discharge status: Home / Self Care | Payer: Medicare PPO | Attending: Internal Medicine | Admitting: Internal Medicine

## 2022-12-01 ENCOUNTER — Encounter (HOSPITAL_COMMUNITY): Payer: Self-pay

## 2022-12-01 ENCOUNTER — Encounter (HOSPITAL_COMMUNITY)
Admission: RE | Disposition: A | Discharge status: Home / Self Care | Payer: Self-pay | Source: Home / Self Care | Attending: Internal Medicine

## 2022-12-01 ENCOUNTER — Ambulatory Visit (HOSPITAL_BASED_OUTPATIENT_CLINIC_OR_DEPARTMENT_OTHER): Payer: Medicare PPO | Admitting: Anesthesiology

## 2022-12-01 ENCOUNTER — Ambulatory Visit (HOSPITAL_COMMUNITY): Payer: Medicare PPO | Admitting: Anesthesiology

## 2022-12-01 DIAGNOSIS — K449 Diaphragmatic hernia without obstruction or gangrene: Secondary | ICD-10-CM

## 2022-12-01 DIAGNOSIS — D125 Benign neoplasm of sigmoid colon: Secondary | ICD-10-CM | POA: Insufficient documentation

## 2022-12-01 DIAGNOSIS — K2970 Gastritis, unspecified, without bleeding: Secondary | ICD-10-CM | POA: Diagnosis not present

## 2022-12-01 DIAGNOSIS — I10 Essential (primary) hypertension: Secondary | ICD-10-CM | POA: Insufficient documentation

## 2022-12-01 DIAGNOSIS — R194 Change in bowel habit: Secondary | ICD-10-CM | POA: Diagnosis not present

## 2022-12-01 DIAGNOSIS — K31A14 Gastric intestinal metaplasia without dysplasia, involving the cardia: Secondary | ICD-10-CM | POA: Insufficient documentation

## 2022-12-01 DIAGNOSIS — D122 Benign neoplasm of ascending colon: Secondary | ICD-10-CM

## 2022-12-01 DIAGNOSIS — K222 Esophageal obstruction: Secondary | ICD-10-CM | POA: Diagnosis not present

## 2022-12-01 DIAGNOSIS — K219 Gastro-esophageal reflux disease without esophagitis: Secondary | ICD-10-CM | POA: Insufficient documentation

## 2022-12-01 DIAGNOSIS — R195 Other fecal abnormalities: Secondary | ICD-10-CM

## 2022-12-01 DIAGNOSIS — K5900 Constipation, unspecified: Secondary | ICD-10-CM | POA: Diagnosis not present

## 2022-12-01 DIAGNOSIS — K2270 Barrett's esophagus without dysplasia: Secondary | ICD-10-CM | POA: Diagnosis not present

## 2022-12-01 DIAGNOSIS — K648 Other hemorrhoids: Secondary | ICD-10-CM | POA: Insufficient documentation

## 2022-12-01 DIAGNOSIS — I472 Ventricular tachycardia, unspecified: Secondary | ICD-10-CM | POA: Diagnosis not present

## 2022-12-01 DIAGNOSIS — G4730 Sleep apnea, unspecified: Secondary | ICD-10-CM | POA: Diagnosis not present

## 2022-12-01 DIAGNOSIS — E039 Hypothyroidism, unspecified: Secondary | ICD-10-CM | POA: Insufficient documentation

## 2022-12-01 DIAGNOSIS — K2289 Other specified disease of esophagus: Secondary | ICD-10-CM | POA: Insufficient documentation

## 2022-12-01 DIAGNOSIS — R1010 Upper abdominal pain, unspecified: Secondary | ICD-10-CM | POA: Diagnosis not present

## 2022-12-01 HISTORY — PX: COLONOSCOPY: SHX174

## 2022-12-01 HISTORY — PX: BIOPSY: SHX5522

## 2022-12-01 HISTORY — PX: ESOPHAGOGASTRODUODENOSCOPY (EGD) WITH PROPOFOL: SHX5813

## 2022-12-01 HISTORY — PX: POLYPECTOMY: SHX149

## 2022-12-01 HISTORY — PX: COLONOSCOPY WITH PROPOFOL: SHX5780

## 2022-12-01 HISTORY — PX: OTHER SURGICAL HISTORY: SHX169

## 2022-12-01 HISTORY — PX: POLYPECTOMY: SHX5525

## 2022-12-01 SURGERY — COLONOSCOPY WITH PROPOFOL
Anesthesia: General

## 2022-12-01 MED ORDER — PROPOFOL 10 MG/ML IV BOLUS
INTRAVENOUS | Status: DC | PRN
  Administered 2022-12-01: 50 mg via INTRAVENOUS
  Administered 2022-12-01: 120 mg via INTRAVENOUS

## 2022-12-01 MED ORDER — LIDOCAINE HCL (CARDIAC) PF 100 MG/5ML IV SOSY
PREFILLED_SYRINGE | INTRAVENOUS | Status: DC | PRN
  Administered 2022-12-01: 100 mg via INTRAVENOUS

## 2022-12-01 MED ORDER — PROPOFOL 500 MG/50ML IV EMUL
INTRAVENOUS | Status: DC | PRN
  Administered 2022-12-01: 150 ug/kg/min via INTRAVENOUS

## 2022-12-01 MED ORDER — LACTATED RINGERS IV SOLN: INTRAVENOUS | Status: DC

## 2022-12-01 NOTE — H&P (Signed)
Primary Care Physician:  Sharilyn Sites, MD Primary Gastroenterologist:  Dr. Abbey Chatters  Pre-Procedure History & Physical: HPI:  Micheal Baker is a 77 y.o. male is here here for an EGD to be performed for epigastric pain and colonoscopy for change in bowel habits.   Past Medical History:  Diagnosis Date   Acute medial meniscus tear of left knee    Depression    Frequent PVCs    GERD (gastroesophageal reflux disease)    Hypertension    Hypothyroidism    NSVT (nonsustained ventricular tachycardia) (HCC)    Osteoarthritis    Thyroid disease     Past Surgical History:  Procedure Laterality Date   CATARACT EXTRACTION     left   COLONOSCOPY N/A 04/08/2016   Surgeon: Danie Binder, MD; one 6 mm polyp in the cecum removed, one 4 mm polyp in the descending colon removed, nonbleeding internal hemorrhoids.  Pathology with hyperplastic polyps.  Recommended colonoscopy in 10 years.   EYE SURGERY     left-scar tissue   groin surgery Right 2010   growth removed    KNEE ARTHROSCOPY     left   KNEE ARTHROSCOPY WITH LATERAL MENISECTOMY Right 03/11/2015   Procedure: RIGHT KNEE ARTHROSCOPY WITH MEDIALAND LATERAL MENISECTOMY;  Surgeon: Elsie Saas, MD;  Location: [place];  Service: Orthopedics;  Laterality: Right;   KNEE ARTHROSCOPY WITH MEDIAL MENISECTOMY Left 06/27/2014   Procedure: LEFT KNEE ARTHROSCOPY WITH PARTIAL MEDIAL AND LATERAL MENISECTOMIES AND CHONDROPLASTY;  Surgeon: Lorn Junes, MD;  Location: [facility];  Service: Orthopedics;  Laterality: Left;   KNEE ARTHROSCOPY WITH MEDIAL MENISECTOMY Right 03/11/2015   Procedure: KNEE ARTHROSCOPY WITH MEDIAL MENISECTOMY;  Surgeon: Elsie Saas, MD;  Location: [place];  Service: Orthopedics;  Laterality: Right;   LEFT HEART CATHETERIZATION WITH CORONARY ANGIOGRAM N/A 09/14/2011   Procedure: LEFT HEART CATHETERIZATION WITH CORONARY ANGIOGRAM;  Surgeon: Pixie Casino, MD;  Location: [facility] CATH LAB;   Service: Cardiovascular;  Laterality: N/A;   POLYPECTOMY  04/08/2016   Procedure: POLYPECTOMY;  Surgeon: Danie Binder, MD;  [facility];  Service: Endoscopy;;  colon    RETINAL DETACHMENT SURGERY Left 2002   Dr. Merlene Morse    Prior to Admission medications   Medication Sig Start Date End Date Taking? Authorizing Provider  acetaminophen (TYLENOL) 500 MG tablet Take 500-1,000 mg by mouth every 6 (six) hours as needed for mild pain or headache.   Yes [provider]  ALPHA LIPOIC ACID PO Take 1 capsule by mouth in the morning.   Yes [provider]  B Complex-C (B-COMPLEX WITH VITAMIN C) tablet Take 1 tablet by mouth in the morning.   Yes [provider]  diclofenac Sodium (VOLTAREN) 1 % GEL Apply 1 Application topically 4 (four) times daily as needed (pain.).   Yes [provider]  escitalopram (LEXAPRO) 20 MG tablet Take 20 mg by mouth daily.   Yes [provider]  fluticasone (FLONASE) 50 MCG/ACT nasal spray Place 1 spray into both nostrils daily as needed for allergies. 09/20/22  Yes [provider]  GAVILYTE-G 236 g solution Take 4,000 mLs by mouth as directed. 11/01/22  Yes [provider]  levothyroxine (SYNTHROID, LEVOTHROID) 75 MCG tablet Take 75 mcg by mouth daily before breakfast. 03/27/15  Yes [provider]  meloxicam (MOBIC) 15 MG tablet Take 1 tablet (15 mg total) by mouth daily. 04/21/22  Yes Hyatt, Max T, DPM  methylPREDNISolone (MEDROL DOSEPAK)  4 MG TBPK tablet Take by mouth. 11/18/22  Yes [provider]  pantoprazole (PROTONIX) 40 MG tablet Take 40 mg by mouth daily at 4 PM. 09/20/22  Yes [provider]  silodosin (RAPAFLO) 8 MG CAPS capsule Take 1 capsule (8 mg total) by mouth at bedtime. 10/07/22  Yes McKenzie, Candee Furbish, MD    Allergies as of 11/01/2022 - Review Complete 10/27/2022  Allergen Reaction Noted   Avelox [moxifloxacin] Other (See Comments) 08/10/2017   Penicillins  Other (See Comments) 08/08/2011    Family History  Problem Relation Age of Onset   Hypertension Mother    Hypertension Father    Heart attack Father    Diabetes Father    Asthma Daughter    Colon cancer Neg Hx    Stomach cancer Neg Hx     Social History   Socioeconomic History   Marital status: Divorced    Spouse name: Not on file   Number of children: 2   Years of education: Masters   Highest education level: Not on file  Occupational History   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  Lives alone in a one story home.  Has 2 children.  Retired from [employer].  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  Education: Oceanographer.     Social Determinants of Health   Financial Resource Strain: Not on file  Food Insecurity: Not on file  Transportation Needs: Not on file  Physical Activity: Not on file  Stress: Not on file  Social Connections: Not on file  Intimate Partner Violence: Not on file    Review of Systems: General: Negative for fever, chills, fatigue, weakness. Eyes: Negative for vision changes.  ENT: Negative for hoarseness, difficulty swallowing , nasal congestion. CV: Negative for chest pain, angina, palpitations, dyspnea on exertion, peripheral edema.  Respiratory: Negative for dyspnea at rest, dyspnea on exertion, cough, sputum, wheezing.  GI: See history of present illness. GU:  Negative for dysuria, hematuria, urinary incontinence,  urinary frequency, nocturnal urination.  MS: Negative for joint pain, low back pain.  Derm: Negative for rash or itching.  Neuro: Negative for weakness, abnormal sensation, seizure, frequent headaches, memory loss, confusion.  Psych: Negative for anxiety, depression Endo: Negative for unusual weight change.  Heme: Negative for bruising or bleeding. Allergy: Negative for rash or hives.  Physical Exam: Vital signs in last 24 hours: Temp:  [98.2 F (36.8 C)] 98.2 F (36.8 C) (03/21 0644) Pulse Rate:  [65] 65 (03/21 0644) Resp:  [13] 13 (03/21 0644) BP: (152)/(89) 152/89 (03/21 0644) SpO2:  [99 %] 99 % (03/21 0644) Weight:  [95.3 kg] 95.3 kg (03/21 0644)   General:   Alert,  Well-developed, well-nourished, pleasant and cooperative in NAD Head:  Normocephalic and atraumatic. Eyes:  Sclera clear, no icterus.   Conjunctiva pink. Ears:  Normal auditory acuity. Nose:  No deformity, discharge,  or lesions. Msk:  Symmetrical without gross deformities. Normal posture. Extremities:  Without clubbing or edema. Neurologic:  Alert and  oriented x4;  grossly normal neurologically. Skin:  Intact without significant lesions or rashes. Psych:  Alert and cooperative. Normal mood and affect.   Impression/Plan: GERBER SOLANKI is here for an EGD to be performed for epigastric pain and colonoscopy for change in bowel habits.   Risks, benefits, limitations, imponderables and alternatives regarding EGD have been reviewed with the patient. Questions have been answered. All parties agreeable.

## 2022-12-01 NOTE — Anesthesia Procedure Notes (Signed)
Date/Time: 12/01/2022 8:20 AM  Performed by: Vista Deck, CRNAPre-anesthesia Checklist: Patient identified, Emergency Drugs available, Suction available, Timeout performed and Patient being monitored Patient Re-evaluated:Patient Re-evaluated prior to induction Oxygen Delivery Method: Nasal Cannula

## 2022-12-01 NOTE — Anesthesia Postprocedure Evaluation (Signed)
Anesthesia Post Note  Patient: Micheal Baker  Procedure(s) Performed: COLONOSCOPY WITH PROPOFOL ESOPHAGOGASTRODUODENOSCOPY (EGD) WITH PROPOFOL BIOPSY POLYPECTOMY  Patient location during evaluation: Phase II Anesthesia Type: General Level of consciousness: awake and alert and oriented Pain management: pain level controlled Vital Signs Assessment: post-procedure vital signs reviewed and stable Respiratory status: spontaneous breathing, nonlabored ventilation and respiratory function stable Cardiovascular status: blood pressure returned to baseline and stable Postop Assessment: no apparent nausea or vomiting Anesthetic complications: no  No notable events documented.   Last Vitals:  Vitals:   12/01/22 0858 12/01/22 0900  BP: (!) 79/46 107/69  Pulse: 66   Resp: 11   Temp: (!) 36.3 C   SpO2: 95% 95%    Last Pain:  Vitals:   12/01/22 0900  TempSrc:   PainSc: 0-No pain                 Adisson Deak C Clytie Shetley

## 2022-12-01 NOTE — Transfer of Care (Signed)
Immediate Anesthesia Transfer of Care Note  Patient: Micheal Baker  Procedure(s) Performed: COLONOSCOPY WITH PROPOFOL ESOPHAGOGASTRODUODENOSCOPY (EGD) WITH PROPOFOL BIOPSY POLYPECTOMY  Patient Location: Short Stay  Anesthesia Type:General  Level of Consciousness: drowsy and patient cooperative  Airway & Oxygen Therapy: Patient Spontanous Breathing  Post-op Assessment: Report given to RN and Post -op Vital signs reviewed and stable  Post vital signs: Reviewed and stable  Last Vitals:  Vitals Value Taken Time  BP 79/46 12/01/22 0858  Temp 36.3 C 12/01/22 0858  Pulse 66 12/01/22 0858  Resp 11 12/01/22 0858  SpO2 95 % 12/01/22 0858   83/47 on repeat B/P Last Pain:  Vitals:   12/01/22 0858  TempSrc: Axillary  PainSc:          Complications: No notable events documented.

## 2022-12-01 NOTE — Anesthesia Preprocedure Evaluation (Signed)
Anesthesia Evaluation  Patient identified by MRN, date of birth, ID band Patient awake    Reviewed: Allergy & Precautions, H&P , NPO status , Patient's Chart, lab work & pertinent test results  Airway Mallampati: III  TM Distance: >3 FB Neck ROM: Full    Dental  (+) Dental Advisory Given Crowns :   Pulmonary sleep apnea and Continuous Positive Airway Pressure Ventilation    Pulmonary exam normal breath sounds clear to auscultation       Cardiovascular Exercise Tolerance: Good hypertension, + dysrhythmias (Frequent PVCs   NSVT (nonsustained ventricular tachycardia) (HCC)) Ventricular Tachycardia  Rhythm:Regular Rate:Normal     Neuro/Psych  PSYCHIATRIC DISORDERS  Depression    negative neurological ROS     GI/Hepatic Neg liver ROS,GERD  Medicated and Controlled,,  Endo/Other  Hypothyroidism    Renal/GU negative Renal ROS  negative genitourinary   Musculoskeletal  (+) Arthritis , Osteoarthritis,    Abdominal   Peds negative pediatric ROS (+)  Hematology negative hematology ROS (+)   Anesthesia Other Findings   Reproductive/Obstetrics negative OB ROS                             Anesthesia Physical Anesthesia Plan  ASA: 2  Anesthesia Plan: General   Post-op Pain Management: Minimal or no pain anticipated   Induction: Intravenous  PONV Risk Score and Plan: Propofol infusion  Airway Management Planned: Nasal Cannula and Natural Airway  Additional Equipment:   Intra-op Plan:   Post-operative Plan:   Informed Consent: I have reviewed the patients History and Physical, chart, labs and discussed the procedure including the risks, benefits and alternatives for the proposed anesthesia with the patient or authorized representative who has indicated his/her understanding and acceptance.     Dental advisory given  Plan Discussed with: CRNA and Surgeon  Anesthesia Plan Comments:          Anesthesia Quick Evaluation

## 2022-12-01 NOTE — Op Note (Signed)
[facility] Patient Name: Micheal Baker Procedure Date: 12/01/2022 8:22 AM MRN: [id number] Date of Birth: 12-06-1945 Attending MD: Elon Alas. Abbey Chatters , [place], [id number] CSN: [id number] Age: 77 Admit Type: Outpatient Procedure:                Upper GI endoscopy Indications:              Epigastric abdominal pain Providers:                Elon Alas. Abbey Chatters, DO, Caprice Kluver, Raphael Gibney,                            Technician Referring MD:             Elon Alas. Abbey Chatters, DO Medicines:                See the Anesthesia note for documentation of the                            administered medications Complications:            No immediate complications. Estimated Blood Loss:     Estimated blood loss was minimal. Procedure:                Pre-Anesthesia Assessment:                           - The anesthesia plan was to use monitored                            anesthesia care (MAC).                           After obtaining informed consent, the endoscope was                            passed under direct vision. Throughout the                            procedure, the patient's blood pressure, pulse, and                            oxygen saturations were monitored continuously. The                            [id number]-[id number]) scope was introduced through the                            mouth, and advanced to the second part of duodenum.                            The upper GI endoscopy was accomplished without                            difficulty. The patient tolerated the procedure                            well. Scope  In: 8:26:17 AM Scope Out: 8:31:12 AM Total Procedure Duration: 0 hours 4 minutes 55 seconds  Findings:      A 2 cm hiatal hernia was present.      A mild Schatzki ring was found in the distal esophagus.      There were esophageal mucosal changes consistent with short-segment       Barrett's esophagus and reflux esophagitis present at the       gastroesophageal junction.  The maximum longitudinal extent of these       mucosal changes was 1-2 cm in length. Mucosa was biopsied with a cold       forceps for histology. One specimen bottle was sent to pathology.      Patchy mild inflammation characterized by erythema was found in the       gastric body and in the gastric antrum. Biopsies were taken with a cold       forceps for Helicobacter pylori testing.      The duodenal bulb, first portion of the duodenum and second portion of       the duodenum were normal. Impression:               - 2 cm hiatal hernia.                           - Mild Schatzki ring.                           - Esophageal mucosal changes consistent with                            short-segment Barrett's esophagus. Biopsied.                           - Gastritis. Biopsied.                           - Normal duodenal bulb, first portion of the                            duodenum and second portion of the duodenum. Moderate Sedation:      Per Anesthesia Care Recommendation:           - Patient has a contact number available for                            emergencies. The signs and symptoms of potential                            delayed complications were discussed with the                            patient. Return to normal activities tomorrow.                            Written discharge instructions were provided to the                            patient.                           -  Resume previous diet.                           - Continue present medications.                           - Await pathology results.                           - Use a proton pump inhibitor PO BID.                           - Return to GI clinic in 3 months. Procedure Code(s):        --- Professional ---                           [phone], Esophagogastroduodenoscopy, flexible,                            transoral; with biopsy, single or multiple Diagnosis Code(s):        --- Professional ---                            K44.9, Diaphragmatic hernia without obstruction or                            gangrene                           K22.2, Esophageal obstruction                           K22.89, Other specified disease of esophagus                           K29.70, Gastritis, unspecified, without bleeding                           R10.13, Epigastric pain CPT copyright 2022 American Medical Association. All rights reserved. The codes documented in this report are preliminary and upon coder review may  be revised to meet current compliance requirements. Elon Alas. Abbey Chatters, DO [place] Abbey Chatters, DO 12/01/2022 8:33:43 AM This report has been signed electronically. Number of Addenda: 0

## 2022-12-01 NOTE — Discharge Instructions (Addendum)
EGD Discharge instructions Please read the instructions outlined below and refer to this sheet in the next few weeks. These discharge instructions provide you with general information on caring for yourself after you leave the hospital. Your doctor may also give you specific instructions. While your treatment has been planned according to the most current medical practices available, unavoidable complications occasionally occur. If you have any problems or questions after discharge, please call your doctor. ACTIVITY You may resume your regular activity but move at a slower pace for the next 24 hours.  Take frequent rest periods for the next 24 hours.  Walking will help expel (get rid of) the air and reduce the bloated feeling in your abdomen.  No driving for 24 hours (because of the anesthesia (medicine) used during the test).  You may shower.  Do not sign any important legal documents or operate any machinery for 24 hours (because of the anesthesia used during the test).  NUTRITION Drink plenty of fluids.  You may resume your normal diet.  Begin with a light meal and progress to your normal diet.  Avoid alcoholic beverages for 24 hours or as instructed by your caregiver.  MEDICATIONS You may resume your normal medications unless your caregiver tells you otherwise.  WHAT YOU CAN EXPECT TODAY You may experience abdominal discomfort such as a feeling of fullness or "gas" pains.  FOLLOW-UP Your doctor will discuss the results of your test with you.  SEEK IMMEDIATE MEDICAL ATTENTION IF ANY OF THE FOLLOWING OCCUR: Excessive nausea (feeling sick to your stomach) and/or vomiting.  Severe abdominal pain and distention (swelling).  Trouble swallowing.  Temperature over 101 F (37.8 C).  Rectal bleeding or vomiting of blood.    Colonoscopy Discharge Instructions  Read the instructions outlined below and refer to this sheet in the next few weeks. These discharge instructions provide you with  general information on caring for yourself after you leave the hospital. Your doctor may also give you specific instructions. While your treatment has been planned according to the most current medical practices available, unavoidable complications occasionally occur.   ACTIVITY You may resume your regular activity, but move at a slower pace for the next 24 hours.  Take frequent rest periods for the next 24 hours.  Walking will help get rid of the air and reduce the bloated feeling in your belly (abdomen).  No driving for 24 hours (because of the medicine (anesthesia) used during the test).   Do not sign any important legal documents or operate any machinery for 24 hours (because of the anesthesia used during the test).  NUTRITION Drink plenty of fluids.  You may resume your normal diet as instructed by your doctor.  Begin with a light meal and progress to your normal diet. Heavy or fried foods are harder to digest and may make you feel sick to your stomach (nauseated).  Avoid alcoholic beverages for 24 hours or as instructed.  MEDICATIONS You may resume your normal medications unless your doctor tells you otherwise.  WHAT YOU CAN EXPECT TODAY Some feelings of bloating in the abdomen.  Passage of more gas than usual.  Spotting of blood in your stool or on the toilet paper.  IF YOU HAD POLYPS REMOVED DURING THE COLONOSCOPY: No aspirin products for 7 days or as instructed.  No alcohol for 7 days or as instructed.  Eat a soft diet for the next 24 hours.  FINDING OUT THE RESULTS OF YOUR TEST Not all test results are available  during your visit. If your test results are not back during the visit, make an appointment with your caregiver to find out the results. Do not assume everything is normal if you have not heard from your caregiver or the medical facility. It is important for you to follow up on all of your test results.  SEEK IMMEDIATE MEDICAL ATTENTION IF: You have more than a spotting of  blood in your stool.  Your belly is swollen (abdominal distention).  You are nauseated or vomiting.  You have a temperature over 101.  You have abdominal pain or discomfort that is severe or gets worse throughout the day.   Your EGD revealed mild amount inflammation in your stomach.  I took biopsies of this to rule out infection with a bacteria called H. pylori.  Await pathology results, my office will contact you.  I also took samples of your esophagus to rule out a condition called Barrett's esophagus.  You have a small hiatal hernia.  Continue on pantoprazole.  Your colonoscopy revealed 3 polyp(s) which I removed successfully. Await pathology results, my office will contact you.   Given your age, I do not think you need to have further colonoscopy for polyp surveillance.  Follow-up with GI in 3 months.  I hope you have a great rest of your week!  Elon Alas. Abbey Chatters, D.O. Gastroenterology and Hepatology [facility]

## 2022-12-01 NOTE — Op Note (Signed)
[facility] Patient Name: Micheal Baker Procedure Date: 12/01/2022 8:21 AM MRN: [id number] Date of Birth: 03-20-1946 Attending MD: Elon Alas. Abbey Chatters , [place], [id number] CSN: [id number] Age: 77 Admit Type: Outpatient Procedure:                Colonoscopy Indications:              Change in bowel habits Providers:                Elon Alas. Abbey Chatters, DO, Caprice Kluver, Raphael Gibney,                            Technician Referring MD:             Elon Alas. Abbey Chatters, DO Medicines:                See the Anesthesia note for documentation of the                            administered medications Complications:            No immediate complications. Estimated Blood Loss:     Estimated blood loss was minimal. Procedure:                Pre-Anesthesia Assessment:                           - The anesthesia plan was to use monitored                            anesthesia care (MAC).                           After obtaining informed consent, the colonoscope                            was passed under direct vision. Throughout the                            procedure, the patient's blood pressure, pulse, and                            oxygen saturations were monitored continuously. The                            [id number]-[id number] scope was introduced through                            the anus and advanced to the the cecum, identified                            by appendiceal orifice and ileocecal valve. The                            colonoscopy was performed without difficulty. The                            patient tolerated the procedure  well. The quality                            of the bowel preparation was evaluated using the                            BBPS [facility] Bowel Preparation Scale) with scores                            of: Right Colon = 3, Transverse Colon = 3 and Left                            Colon = 3 (entire mucosa seen well with no residual                            staining,  small fragments of stool or opaque                            liquid). The total BBPS score equals 9. Scope In: 8:36:45 AM Scope Out: 8:51:12 AM Scope Withdrawal Time: 0 hours 12 minutes 16 seconds  Total Procedure Duration: 0 hours 14 minutes 27 seconds  Findings:      Non-bleeding internal hemorrhoids were found during endoscopy.      A 4 mm polyp was found in the ascending colon. The polyp was sessile.       The polyp was removed with a cold snare. Resection and retrieval were       complete.      Two sessile polyps were found in the sigmoid colon. The polyps were 4 to       6 mm in size. These polyps were removed with a cold snare. Resection and       retrieval were complete.      The exam was otherwise without abnormality. Impression:               - Non-bleeding internal hemorrhoids.                           - One 4 mm polyp in the ascending colon, removed                            with a cold snare. Resected and retrieved.                           - Two 4 to 6 mm polyps in the sigmoid colon,                            removed with a cold snare. Resected and retrieved.                           - The examination was otherwise normal. Moderate Sedation:      Per Anesthesia Care Recommendation:           - Patient has a contact number available for  emergencies. The signs and symptoms of potential                            delayed complications were discussed with the                            patient. Return to normal activities tomorrow.                            Written discharge instructions were provided to the                            patient.                           - Resume previous diet.                           - Continue present medications.                           - Await pathology results.                           - No repeat colonoscopy due to age.                           - Return to GI clinic in 3 months. Procedure  Code(s):        --- Professional ---                           [phone], Colonoscopy, flexible; with removal of                            tumor(s), polyp(s), or other lesion(s) by snare                            technique Diagnosis Code(s):        --- Professional ---                           D12.2, Benign neoplasm of ascending colon                           D12.5, Benign neoplasm of sigmoid colon                           K64.8, Other hemorrhoids                           R19.4, Change in bowel habit CPT copyright 2022 American Medical Association. All rights reserved. The codes documented in this report are preliminary and upon coder review may  be revised to meet current compliance requirements. Elon Alas. Abbey Chatters, DO Luna Abbey Chatters, DO 12/01/2022 8:54:56 AM This report has been signed electronically. Number of Addenda: 0

## 2022-12-02 DIAGNOSIS — M9903 Segmental and somatic dysfunction of lumbar region: Secondary | ICD-10-CM | POA: Diagnosis not present

## 2022-12-02 DIAGNOSIS — M62830 Muscle spasm of back: Secondary | ICD-10-CM | POA: Diagnosis not present

## 2022-12-02 DIAGNOSIS — M9905 Segmental and somatic dysfunction of pelvic region: Secondary | ICD-10-CM | POA: Diagnosis not present

## 2022-12-02 DIAGNOSIS — M9902 Segmental and somatic dysfunction of thoracic region: Secondary | ICD-10-CM | POA: Diagnosis not present

## 2022-12-02 LAB — SURGICAL PATHOLOGY

## 2022-12-05 ENCOUNTER — Telehealth: Payer: Self-pay | Admitting: Pulmonary Disease

## 2022-12-05 DIAGNOSIS — M62830 Muscle spasm of back: Secondary | ICD-10-CM | POA: Diagnosis not present

## 2022-12-05 DIAGNOSIS — M9905 Segmental and somatic dysfunction of pelvic region: Secondary | ICD-10-CM | POA: Diagnosis not present

## 2022-12-05 DIAGNOSIS — M9903 Segmental and somatic dysfunction of lumbar region: Secondary | ICD-10-CM | POA: Diagnosis not present

## 2022-12-05 DIAGNOSIS — M9902 Segmental and somatic dysfunction of thoracic region: Secondary | ICD-10-CM | POA: Diagnosis not present

## 2022-12-05 NOTE — Telephone Encounter (Signed)
Made appt for 1 yr FU end of April. Pls sched for a CT w/o contrast. TY.

## 2022-12-06 NOTE — Telephone Encounter (Signed)
Ct is scheduled for 01/11/2023

## 2022-12-07 DIAGNOSIS — M62830 Muscle spasm of back: Secondary | ICD-10-CM | POA: Diagnosis not present

## 2022-12-07 DIAGNOSIS — M9903 Segmental and somatic dysfunction of lumbar region: Secondary | ICD-10-CM | POA: Diagnosis not present

## 2022-12-07 DIAGNOSIS — M9902 Segmental and somatic dysfunction of thoracic region: Secondary | ICD-10-CM | POA: Diagnosis not present

## 2022-12-07 DIAGNOSIS — M9905 Segmental and somatic dysfunction of pelvic region: Secondary | ICD-10-CM | POA: Diagnosis not present

## 2022-12-08 ENCOUNTER — Encounter (HOSPITAL_COMMUNITY): Payer: Self-pay | Admitting: Emergency Medicine

## 2022-12-08 ENCOUNTER — Other Ambulatory Visit: Payer: Self-pay

## 2022-12-08 ENCOUNTER — Emergency Department (HOSPITAL_COMMUNITY)
Admission: EM | Admit: 2022-12-08 | Discharge: 2022-12-08 | Disposition: A | Discharge status: Home / Self Care | Payer: Medicare PPO | Attending: Emergency Medicine | Admitting: Emergency Medicine

## 2022-12-08 DIAGNOSIS — I10 Essential (primary) hypertension: Secondary | ICD-10-CM | POA: Insufficient documentation

## 2022-12-08 DIAGNOSIS — R300 Dysuria: Secondary | ICD-10-CM | POA: Diagnosis present

## 2022-12-08 DIAGNOSIS — E039 Hypothyroidism, unspecified: Secondary | ICD-10-CM | POA: Diagnosis not present

## 2022-12-08 DIAGNOSIS — R1030 Lower abdominal pain, unspecified: Secondary | ICD-10-CM | POA: Insufficient documentation

## 2022-12-08 DIAGNOSIS — R339 Retention of urine, unspecified: Secondary | ICD-10-CM | POA: Diagnosis not present

## 2022-12-08 LAB — URINALYSIS, ROUTINE W REFLEX MICROSCOPIC
Bacteria, UA: NONE SEEN
Bilirubin Urine: NEGATIVE
Glucose, UA: NEGATIVE mg/dL
Ketones, ur: NEGATIVE mg/dL
Leukocytes,Ua: NEGATIVE
Nitrite: NEGATIVE
Protein, ur: NEGATIVE mg/dL
Specific Gravity, Urine: 1.008 (ref 1.005–1.030)
pH: 7 (ref 5.0–8.0)

## 2022-12-08 NOTE — Discharge Instructions (Signed)
You were evaluated in the Emergency Department and after careful evaluation, we did not find any emergent condition requiring admission or further testing in the hospital.  Your exam/testing today is overall reassuring.  Follow-up with Dr. Alyson Ingles for further management.  Please return to the Emergency Department if you experience any worsening of your condition.   Thank you for allowing [place] to be a part of your care.

## 2022-12-08 NOTE — ED Triage Notes (Signed)
Pt to ed from home. C/o of frequent, difficulty urination that started to today. Pt states he has an enlarged prostate that has caused these problems before. Pt states that had endoscopy and colonoscopy 1wk ago.

## 2022-12-08 NOTE — ED Notes (Signed)
ED Provider at bedside. 

## 2022-12-08 NOTE — ED Provider Notes (Signed)
[facility] Emergency Department Provider Note MRN:  [id number]  Arrival date & time: 12/08/22     Chief Complaint   Dysuria   History of Present Illness   Micheal Baker is a 77 y.o. year-old male with a history of BPH presenting to the ED with chief complaint of dysuria.  Straining to urinate all day today.  Frequency increased, not able to fully void.  Pressure in the lower abdomen.  No fever.  Review of Systems  A thorough review of systems was obtained and all systems are negative except as noted in the HPI and PMH.   Patient's Health History    Past Medical History:  Diagnosis Date   Acute medial meniscus tear of left knee    Depression    Frequent PVCs    GERD (gastroesophageal reflux disease)    Hypertension    Hypothyroidism    NSVT (nonsustained ventricular tachycardia) (HCC)    Osteoarthritis    Thyroid disease     Past Surgical History:  Procedure Laterality Date   CATARACT EXTRACTION     left   COLONOSCOPY N/A 04/08/2016   Surgeon: Danie Binder, MD; one 6 mm polyp in the cecum removed, one 4 mm polyp in the descending colon removed, nonbleeding internal hemorrhoids.  Pathology with hyperplastic polyps.  Recommended colonoscopy in 10 years.   COLONOSCOPY  12/01/2022   endosocopy  12/01/2022   EYE SURGERY     left-scar tissue   groin surgery Right 2010   growth removed    KNEE ARTHROSCOPY     left   KNEE ARTHROSCOPY WITH LATERAL MENISECTOMY Right 03/11/2015   Procedure: RIGHT KNEE ARTHROSCOPY WITH MEDIALAND LATERAL MENISECTOMY;  Surgeon: Elsie Saas, MD;  Location: [place];  Service: Orthopedics;  Laterality: Right;   KNEE ARTHROSCOPY WITH MEDIAL MENISECTOMY Left 06/27/2014   Procedure: LEFT KNEE ARTHROSCOPY WITH PARTIAL MEDIAL AND LATERAL MENISECTOMIES AND CHONDROPLASTY;  Surgeon: Lorn Junes, MD;  Location: [place];  Service: Orthopedics;  Laterality: Left;   KNEE ARTHROSCOPY WITH MEDIAL  MENISECTOMY Right 03/11/2015   Procedure: KNEE ARTHROSCOPY WITH MEDIAL MENISECTOMY;  Surgeon: Elsie Saas, MD;  Location: [place];  Service: Orthopedics;  Laterality: Right;   LEFT HEART CATHETERIZATION WITH CORONARY ANGIOGRAM N/A 09/14/2011   Procedure: LEFT HEART CATHETERIZATION WITH CORONARY ANGIOGRAM;  Surgeon: Pixie Casino, MD;  Location: [facility] C[facility];  Service: Cardiovascular;  Laterality: N/A;   POLYPECTOMY  04/08/2016   Procedure: POLYPECTOMY;  Surgeon: Danie Binder, MD;  [facility];  Service: Endoscopy;;  colon    POLYPECTOMY  12/01/2022   RETINAL DETACHMENT SURGERY Left 2002   Dr. 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  Lives alone in a one story home.  Has 2 children.  Retired from [employer].  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  Education: Oceanographer.     Social Determinants of Health   Financial Resource Strain: Not on file  Food Insecurity: Not on file  Transportation Needs:  Not on file  Physical Activity: Not on file  Stress: Not on file  Social Connections: Not on file  Intimate Partner Violence: Not on file     Physical Exam   Vitals:   12/08/22 0200 12/08/22 0230  BP: (!) 146/115 (!) 148/129  Pulse: 98 81  Resp: 16 17  Temp: 97.6 F (36.4 C)   SpO2: 100% 100%    CONSTITUTIONAL: Well-appearing, NAD NEURO/PSYCH:  Alert and oriented x 3, no focal deficits EYES:  eyes equal and reactive ENT/NECK:  no LAD, no JVD CARDIO: Regular rate, well-perfused, normal S1 and S2 PULM:  CTAB no wheezing or rhonchi GI/GU:  non-distended, non-tender MSK/SPINE:  No gross deformities, no edema SKIN:  no rash, atraumatic   *Additional and/or pertinent findings included in MDM below  Diagnostic and Interventional Summary    EKG Interpretation  Date/Time:    Ventricular Rate:    PR Interval:    QRS Duration:   QT Interval:    QTC Calculation:   R Axis:     Text Interpretation:         Labs Reviewed  URINALYSIS, ROUTINE W REFLEX MICROSCOPIC - Abnormal; Notable for the following components:      Result Value   Hgb urine dipstick SMALL (*)    All other components within normal limits    No orders to display    Medications - No data to display   Procedures  /  Critical Care Procedures  ED Course and Medical Decision Making  Initial Impression and Ddx Suspect urinary retention in the setting of BPH.  Bladder scan revealing greater than 700, placing Foley catheter.  UTI also considered.  Past medical/surgical history that increases complexity of ED encounter: BPH  Interpretation of Diagnostics I personally reviewed the urinalysis and my interpretation is as follows: Doubt infection    Patient Reassessment and Ultimate Disposition/Management     Patient feeling all the way better after Foley catheter placed, appropriate for discharge.  Patient management required discussion with the following services or consulting groups:   None  Complexity of Problems Addressed Acute illness or injury that poses threat of life of bodily function  Additional Data Reviewed and Analyzed Further history obtained from: None  Additional Factors Impacting ED Encounter Risk None  Barth Kirks. Sedonia Small, [place] [email or web address]  Final Clinical Impressions(s) / ED Diagnoses     ICD-10-CM   1. Urinary retention  R33.9       ED Discharge Orders     None        Discharge Instructions Discussed with and Provided to Patient:     Discharge Instructions      You were evaluated in the Emergency  Department and after careful evaluation, we did not find any emergent condition requiring admission or further testing in the hospital.  Your exam/testing today is overall reassuring.  Follow-up with Dr. Alyson Ingles for further management.  Please return to the Emergency Department if you experience any worsening of your condition.   Thank you for allowing [place] to be a part of your care.       Maudie Flakes, MD 12/08/22 [phone]

## 2022-12-13 ENCOUNTER — Encounter (HOSPITAL_COMMUNITY): Payer: Self-pay | Admitting: Internal Medicine

## 2022-12-13 ENCOUNTER — Telehealth: Payer: Self-pay | Admitting: Internal Medicine

## 2022-12-13 NOTE — Telephone Encounter (Signed)
Phoned and spoke with the pt and answered all of his questions. Pt has more understanding and would rather get calls than MyChart messages.

## 2022-12-13 NOTE — Telephone Encounter (Signed)
I called pt to get him scheudled post procedure and he said he had MyChart now and he wanted to know would anyone still call him about the results of his procedure or does he just read the report on MyChart?  He said the papers he had from procedure day it says someone from the office would contact him.

## 2022-12-14 ENCOUNTER — Telehealth: Payer: Self-pay

## 2022-12-14 NOTE — Telephone Encounter (Signed)
Patient states he had concerns with blood in his urine.  Patient has indwelling catheter.  I assured him this can be normal with having a catheter placed to increase fluids for now and to follow up with MD as scheduled.  Patient also aware to be seen at ER or urgent care if he notices catheter is not draining at all.  Patient voiced understanding.

## 2022-12-20 ENCOUNTER — Ambulatory Visit (HOSPITAL_COMMUNITY)
Admission: RE | Admit: 2022-12-20 | Discharge: 2022-12-20 | Disposition: A | Discharge status: Home / Self Care | Payer: Medicare PPO | Source: Ambulatory Visit | Attending: Family Medicine | Admitting: Family Medicine

## 2022-12-20 DIAGNOSIS — M25562 Pain in left knee: Secondary | ICD-10-CM | POA: Diagnosis not present

## 2022-12-20 DIAGNOSIS — S83242A Other tear of medial meniscus, current injury, left knee, initial encounter: Secondary | ICD-10-CM | POA: Diagnosis not present

## 2022-12-21 ENCOUNTER — Ambulatory Visit: Payer: Medicare PPO | Admitting: Urology

## 2022-12-21 ENCOUNTER — Encounter: Payer: Self-pay | Admitting: Urology

## 2022-12-21 VITALS — BP 135/90 | HR 86

## 2022-12-21 DIAGNOSIS — N138 Other obstructive and reflux uropathy: Secondary | ICD-10-CM | POA: Diagnosis not present

## 2022-12-21 DIAGNOSIS — R339 Retention of urine, unspecified: Secondary | ICD-10-CM

## 2022-12-21 DIAGNOSIS — N401 Enlarged prostate with lower urinary tract symptoms: Secondary | ICD-10-CM | POA: Diagnosis not present

## 2022-12-21 MED ORDER — FINASTERIDE 5 MG PO TABS: 5.0000 mg | ORAL_TABLET | Freq: Every day | ORAL | 3 refills | Status: DC

## 2022-12-21 NOTE — Progress Notes (Unsigned)
12/21/2022 9:36 AM   [name] 1946-04-02 [id number]  Referring provider: [name], MD [address] [place],  [place] [address]  No chief complaint on file.   HPI: [name] is a 77yo here for followup for BPH and urinary retention. He developed urinary retention on 3/28 and had a foley placed. Voiding trial passed today. He is on rapalfo 8mg  daily. He has a 200g prostate on [place].   PMH: Past Medical History:  Diagnosis Date   Acute medial meniscus tear of left knee    Depression    Frequent PVCs    GERD (gastroesophageal reflux disease)    Hypertension    Hypothyroidism    NSVT (nonsustained ventricular tachycardia)    Osteoarthritis    Thyroid disease     Surgical History: Past Surgical History:  Procedure Laterality Date   BIOPSY  12/01/2022   Procedure: BIOPSY;  Surgeon: Lanelle Bal, DO;  [facility];  Service: Endoscopy;;   CATARACT EXTRACTION     left   COLONOSCOPY N/A 04/08/2016   Surgeon: West Bali, MD; one 6 mm polyp in the cecum removed, one 4 mm polyp in the descending colon removed, nonbleeding internal hemorrhoids.  Pathology with hyperplastic polyps.  Recommended colonoscopy in 10 years.   COLONOSCOPY  12/01/2022   COLONOSCOPY WITH PROPOFOL N/A 12/01/2022   Procedure: COLONOSCOPY WITH PROPOFOL;  Surgeon: Lanelle Bal, DO;  [facility];  Service: Endoscopy;  Laterality: N/A;  8:15 am, asa 3   endosocopy  12/01/2022   ESOPHAGOGASTRODUODENOSCOPY (EGD) WITH PROPOFOL N/A 12/01/2022   Procedure: ESOPHAGOGASTRODUODENOSCOPY (EGD) WITH PROPOFOL;  Surgeon: Lanelle Bal, DO;  [facility];  Service: Endoscopy;  Laterality: N/A;   EYE SURGERY     left-scar tissue   groin surgery Right 2010   growth removed    KNEE ARTHROSCOPY     left   KNEE ARTHROSCOPY WITH LATERAL MENISECTOMY Right 03/11/2015   Procedure: RIGHT KNEE ARTHROSCOPY WITH MEDIALAND LATERAL MENISECTOMY;  Surgeon: Salvatore Marvel, MD;   Location: [facility];  Service: Orthopedics;  Laterality: Right;   KNEE ARTHROSCOPY WITH MEDIAL MENISECTOMY Left 06/27/2014   Procedure: LEFT KNEE ARTHROSCOPY WITH PARTIAL MEDIAL AND LATERAL MENISECTOMIES AND CHONDROPLASTY;  Surgeon: Nilda Simmer, MD;  Location: [facility];  Service: Orthopedics;  Laterality: Left;   KNEE ARTHROSCOPY WITH MEDIAL MENISECTOMY Right 03/11/2015   Procedure: KNEE ARTHROSCOPY WITH MEDIAL MENISECTOMY;  Surgeon: Salvatore Marvel, MD;  Location: [facility];  Service: Orthopedics;  Laterality: Right;   LEFT HEART CATHETERIZATION WITH CORONARY ANGIOGRAM N/A 09/14/2011   Procedure: LEFT HEART CATHETERIZATION WITH CORONARY ANGIOGRAM;  Surgeon: [name], MD;  Location: [facility] CATH LAB;  Service: Cardiovascular;  Laterality: N/A;   POLYPECTOMY  04/08/2016   Procedure: POLYPECTOMY;  Surgeon: West Bali, MD;  [facility];  Service: Endoscopy;;  colon    POLYPECTOMY  12/01/2022   POLYPECTOMY  12/01/2022   Procedure: POLYPECTOMY;  Surgeon: Lanelle Bal, DO;  [facility];  Service: Endoscopy;;   RETINAL DETACHMENT SURGERY Left 2002   Dr. Hermina Barters    Home Medications:  Allergies as of 12/21/2022       Reactions   Avelox [moxifloxacin] Other (See Comments)   Caused tendonitis   Penicillins Other (See Comments)   Chills        Medication List        Accurate as of December 21, 2022  9:36 AM. If you have any questions, ask your nurse or doctor.          acetaminophen 500 MG tablet Commonly known as: TYLENOL Take 500-1,000 mg by mouth every 6 (six) hours as needed for mild pain or headache.   ALPHA LIPOIC ACID PO Take 1 capsule by mouth in the morning.   B-complex with vitamin C tablet Take 1 tablet by mouth in the morning.   escitalopram 20 MG tablet Commonly known as: LEXAPRO Take 20 mg by mouth daily.   fluticasone 50 MCG/ACT nasal spray Commonly known as: FLONASE Place 1  spray into both nostrils daily as needed for allergies.   GaviLyte-G 236 g solution Generic drug: polyethylene glycol Take 4,000 mLs by mouth as directed.   levothyroxine 75 MCG tablet Commonly known as: SYNTHROID Take 75 mcg by mouth daily before breakfast.   meloxicam 15 MG tablet Commonly known as: MOBIC Take 1 tablet (15 mg total) by mouth daily.   methylPREDNISolone 4 MG Tbpk tablet Commonly known as: MEDROL DOSEPAK Take by mouth.   pantoprazole 40 MG tablet Commonly known as: PROTONIX Take 40 mg by mouth daily at 4 PM.   silodosin 8 MG Caps capsule Commonly known as: RAPAFLO Take 1 capsule (8 mg total) by mouth at bedtime.   Voltaren 1 % Gel Generic drug: diclofenac Sodium Apply 1 Application topically 4 (four) times daily as needed (pain.).        Allergies:  Allergies  Allergen Reactions   Avelox [Moxifloxacin] Other (See Comments)    Caused tendonitis   Penicillins Other (See Comments)    Chills     Family History: Family History  Problem Relation Age of Onset   Hypertension Mother    Hypertension Father    Heart attack Father    Diabetes Father    Asthma Daughter    Colon cancer Neg Hx    Stomach cancer Neg Hx     Social History:  reports that he has never smoked. He has never used smokeless tobacco. He reports current alcohol use. He reports that he does not use drugs.  ROS: All other review of systems were reviewed and are negative except what is noted above in HPI  Physical Exam: BP (!) 135/90   Pulse 86   Constitutional:  Alert and oriented, No acute distress. HEENT: [place] AT, moist mucus membranes.  Trachea midline, no masses. Cardiovascular: No clubbing, cyanosis, or edema. Respiratory: Normal respiratory effort, no increased work of breathing. GI: Abdomen is soft, nontender, nondistended, no abdominal masses GU: No CVA tenderness.  Lymph: No cervical or inguinal lymphadenopathy. Skin: No rashes, bruises or suspicious  lesions. Neurologic: Grossly intact, no focal deficits, moving all 4 extremities. Psychiatric: Normal mood and affect.  Laboratory Data: Lab Results  Component Value Date   WBC 10.5 02/01/2022   HGB 12.3 (L) 02/01/2022   HCT 36.7 (L) 02/01/2022   MCV 89.5 02/01/2022   PLT 153 02/01/2022    Lab Results  Component Value Date   CREATININE 0.84 02/02/2022    No results found for: "PSA"  No results found for: "TESTOSTERONE"  No results found for: "HGBA1C"  Urinalysis    Component Value Date/Time   COLORURINE YELLOW 12/08/2022 0223   APPEARANCEUR CLEAR 12/08/2022 0223   APPEARANCEUR Clear 10/07/2022 1143   LABSPEC 1.008 12/08/2022 0223   PHURINE 7.0 12/08/2022 0223   GLUCOSEU NEGATIVE 12/08/2022 0223   HGBUR SMALL (A) 12/08/2022 0223   BILIRUBINUR NEGATIVE 12/08/2022 0223  BILIRUBINUR Negative 10/07/2022 1143   KETONESUR NEGATIVE 12/08/2022 0223   PROTEINUR NEGATIVE 12/08/2022 0223   UROBILINOGEN 0.2 08/08/2011 0800   NITRITE NEGATIVE 12/08/2022 0223   LEUKOCYTESUR NEGATIVE 12/08/2022 0223    Lab Results  Component Value Date   LABMICR Comment 10/07/2022   WBCUA 6-10 (A) 02/15/2022   LABEPIT 0-10 02/15/2022   MUCUS Present 02/15/2022   BACTERIA NONE SEEN 12/08/2022    Pertinent Imaging:  No results found for this or any previous visit.  No results found for this or any previous visit.  No results found for this or any previous visit.  No results found for this or any previous visit.  No results found for this or any previous visit.  No valid procedures specified. No results found for this or any previous visit.  Results for orders placed during the hospital encounter of 01/30/22  CT Renal Stone Study  Narrative CLINICAL DATA:  Flank pain.  EXAM: CT ABDOMEN AND PELVIS WITHOUT CONTRAST  TECHNIQUE: Multidetector CT imaging of the abdomen and pelvis was performed following the standard protocol without IV contrast.  RADIATION DOSE REDUCTION:  This exam was performed according to the departmental dose-optimization program which includes automated exposure control, adjustment of the mA and/or kV according to patient size and/or use of iterative reconstruction technique.  COMPARISON:  CT abdomen and pelvis 01/18/2007  FINDINGS: Lower chest: There is a new 6 mm nodule in the left lung base. Lung bases are otherwise clear. There are calcified granulomas in the lung bases.  Hepatobiliary: Calcified granulomas are present. There is a 3 cm cyst in the inferior right lobe of the liver which has slightly increased in size compared to the prior study. Gallbladder and bile ducts are within normal limits.  Pancreas: Unremarkable. No pancreatic ductal dilatation or surrounding inflammatory changes.  Spleen: Calcified granulomas are present. Otherwise within normal limits.  Adrenals/Urinary Tract: Foley catheter is seen within the bladder. No urinary tract calculus or hydronephrosis. Adrenal glands within normal limits.  Stomach/Bowel: Stomach is within normal limits. Appendix appears normal. No evidence of bowel wall thickening, distention, or inflammatory changes. Appendix is not seen. Small hiatal hernia and duodenal diverticulum again noted.  Vascular/Lymphatic: Aortic atherosclerosis. No enlarged abdominal or pelvic lymph nodes.  Reproductive: Prostate gland is enlarged and lobulated with nodular protrusion into the bladder base. Prostate measures 7.0 x 7.3 by 5.9 cm and has significantly increased in size.  Other: No ascites.  Small fat containing inguinal hernias.  Musculoskeletal: No acute or significant osseous findings.  IMPRESSION: 1. No evidence for urinary tract calculus or hydronephrosis. 2. Foley catheter in the bladder. 3. Marked enlargement of the prostate gland with nodular protrusion into the bladder base, new from 2008. Recommend clinical correlation and follow-up to exclude neoplasm. 4. Hepatic  cyst. 5. Old granulomatous disease. 6. 6 mm left solid pulmonary nodule. Recommend a non-contrast Chest CT at 6-12 months. If patient is high risk for malignancy, recommend an additional non-contrast Chest CT at 18-24 months; if patient is low risk for malignancy a non-contrast Chest CT at 18-24 months is optional. These guidelines do not apply to immunocompromised patients and patients with cancer. Follow up in patients with significant comorbidities as clinically warranted. For lung cancer screening, adhere to Lung-RADS guidelines. Reference: Radiology. 2017; 284(1):228-43.   Electronically Signed By: Darliss Cheney M.D. On: 01/30/2022 16:15   Assessment & Plan:    1. Urinary retention -we will continue rapaflo 8mg  and at finasteride 5mg  daily - Bladder Voiding  Trial  2. Benign prostatic hyperplasia with urinary obstruction Rapaflo 8mg  daily and finasteride 5mg  daily   No follow-ups on file.  Wilkie Aye, MD  [facility] [place]

## 2022-12-21 NOTE — Progress Notes (Signed)
Fill and Pull Catheter Removal  Patient is present today for a catheter removal.  Patient was cleaned and prepped in a sterile fashion 295 ml of sterile water/ saline was instilled into the bladder when the patient felt the urge to urinate. 10 ml of water was then drained from the balloon.  A 16 FR foley cath was removed from the bladder no complications were noted .  Patient as then given some time to void on their own.  Patient can void  250 ml on their own after some time.  Patient tolerated well.  Performed by: Kennyth Lose, CMA  Follow up/ Additional notes: Keep scheduled Follow up

## 2022-12-23 DIAGNOSIS — M9903 Segmental and somatic dysfunction of lumbar region: Secondary | ICD-10-CM | POA: Diagnosis not present

## 2022-12-23 DIAGNOSIS — M9905 Segmental and somatic dysfunction of pelvic region: Secondary | ICD-10-CM | POA: Diagnosis not present

## 2022-12-23 DIAGNOSIS — M62830 Muscle spasm of back: Secondary | ICD-10-CM | POA: Diagnosis not present

## 2022-12-23 DIAGNOSIS — M9902 Segmental and somatic dysfunction of thoracic region: Secondary | ICD-10-CM | POA: Diagnosis not present

## 2022-12-24 ENCOUNTER — Other Ambulatory Visit: Payer: Self-pay

## 2022-12-24 ENCOUNTER — Encounter (HOSPITAL_COMMUNITY): Payer: Self-pay

## 2022-12-24 ENCOUNTER — Inpatient Hospital Stay (HOSPITAL_COMMUNITY)
Admission: EM | Admit: 2022-12-24 | Discharge: 2022-12-28 | DRG: 872 | Disposition: A | Discharge status: Home Health | Payer: Medicare PPO | Attending: Family Medicine | Admitting: Family Medicine

## 2022-12-24 DIAGNOSIS — F32A Depression, unspecified: Secondary | ICD-10-CM | POA: Diagnosis not present

## 2022-12-24 DIAGNOSIS — Z79899 Other long term (current) drug therapy: Secondary | ICD-10-CM

## 2022-12-24 DIAGNOSIS — N3000 Acute cystitis without hematuria: Secondary | ICD-10-CM | POA: Diagnosis not present

## 2022-12-24 DIAGNOSIS — Z833 Family history of diabetes mellitus: Secondary | ICD-10-CM | POA: Diagnosis not present

## 2022-12-24 DIAGNOSIS — Z86010 Personal history of colonic polyps: Secondary | ICD-10-CM

## 2022-12-24 DIAGNOSIS — A4159 Other Gram-negative sepsis: Principal | ICD-10-CM | POA: Diagnosis present

## 2022-12-24 DIAGNOSIS — N400 Enlarged prostate without lower urinary tract symptoms: Secondary | ICD-10-CM | POA: Diagnosis present

## 2022-12-24 DIAGNOSIS — E039 Hypothyroidism, unspecified: Secondary | ICD-10-CM | POA: Diagnosis present

## 2022-12-24 DIAGNOSIS — Z79890 Hormone replacement therapy (postmenopausal): Secondary | ICD-10-CM

## 2022-12-24 DIAGNOSIS — Z8249 Family history of ischemic heart disease and other diseases of the circulatory system: Secondary | ICD-10-CM

## 2022-12-24 DIAGNOSIS — Z791 Long term (current) use of non-steroidal anti-inflammatories (NSAID): Secondary | ICD-10-CM

## 2022-12-24 DIAGNOSIS — E876 Hypokalemia: Secondary | ICD-10-CM | POA: Diagnosis not present

## 2022-12-24 DIAGNOSIS — Z825 Family history of asthma and other chronic lower respiratory diseases: Secondary | ICD-10-CM

## 2022-12-24 DIAGNOSIS — Z888 Allergy status to other drugs, medicaments and biological substances status: Secondary | ICD-10-CM | POA: Diagnosis not present

## 2022-12-24 DIAGNOSIS — F419 Anxiety disorder, unspecified: Secondary | ICD-10-CM | POA: Diagnosis not present

## 2022-12-24 DIAGNOSIS — A419 Sepsis, unspecified organism: Secondary | ICD-10-CM | POA: Diagnosis not present

## 2022-12-24 DIAGNOSIS — R5383 Other fatigue: Secondary | ICD-10-CM | POA: Diagnosis present

## 2022-12-24 DIAGNOSIS — K219 Gastro-esophageal reflux disease without esophagitis: Secondary | ICD-10-CM | POA: Diagnosis not present

## 2022-12-24 DIAGNOSIS — Z880 Allergy status to penicillin: Secondary | ICD-10-CM

## 2022-12-24 DIAGNOSIS — N390 Urinary tract infection, site not specified: Secondary | ICD-10-CM | POA: Diagnosis not present

## 2022-12-24 DIAGNOSIS — I493 Ventricular premature depolarization: Secondary | ICD-10-CM | POA: Diagnosis present

## 2022-12-24 DIAGNOSIS — H04123 Dry eye syndrome of bilateral lacrimal glands: Secondary | ICD-10-CM | POA: Diagnosis present

## 2022-12-24 DIAGNOSIS — I10 Essential (primary) hypertension: Secondary | ICD-10-CM | POA: Diagnosis present

## 2022-12-24 LAB — URINALYSIS, ROUTINE W REFLEX MICROSCOPIC
Bilirubin Urine: NEGATIVE
Glucose, UA: NEGATIVE mg/dL
Ketones, ur: NEGATIVE mg/dL
Nitrite: NEGATIVE
Protein, ur: 30 mg/dL — AB
Specific Gravity, Urine: 1.012 (ref 1.005–1.030)
WBC, UA: >50 WBC/hpf (ref 0–5)
pH: 7 (ref 5.0–8.0)

## 2022-12-24 LAB — BASIC METABOLIC PANEL
Anion gap: 7 (ref 5–15)
BUN: 17 mg/dL (ref 8–23)
CO2: 26 mmol/L (ref 22–32)
Calcium: 9.1 mg/dL (ref 8.9–10.3)
Chloride: 101 mmol/L (ref 98–111)
Creatinine, Ser: 0.96 mg/dL (ref 0.61–1.24)
GFR, Estimated: >60 mL/min (ref >60)
Glucose, Bld: 111 mg/dL — ABNORMAL HIGH (ref 70–99)
Potassium: 4.4 mmol/L (ref 3.5–5.1)
Sodium: 134 mmol/L — ABNORMAL LOW (ref 135–145)

## 2022-12-24 LAB — CBC
HCT: 41.9 % (ref 39.0–52.0)
Hemoglobin: 14.4 g/dL (ref 13.0–17.0)
MCH: 29.9 pg (ref 26.0–34.0)
MCHC: 34.4 g/dL (ref 30.0–36.0)
MCV: 86.9 fL (ref 80.0–100.0)
Platelets: 231 10*3/uL (ref 150–400)
RBC: 4.82 MIL/uL (ref 4.22–5.81)
RDW: 13 % (ref 11.5–15.5)
WBC: 12.8 10*3/uL — ABNORMAL HIGH (ref 4.0–10.5)
nRBC: 0 % (ref 0.0–0.2)

## 2022-12-24 LAB — LACTIC ACID, PLASMA: Lactic Acid, Venous: 1.7 mmol/L (ref 0.5–1.9)

## 2022-12-24 MED ORDER — SODIUM CHLORIDE 0.9 % IV BOLUS (SEPSIS): 1000.0000 mL | Freq: Once | INTRAVENOUS | Status: DC

## 2022-12-24 MED ORDER — LORAZEPAM 2 MG/ML IJ SOLN
0.5000 mg | Freq: Once | INTRAMUSCULAR | Status: AC
  Administered 2022-12-24: 0.5 mg via INTRAVENOUS
  Filled 2022-12-24: qty 1

## 2022-12-24 MED ORDER — ACETAMINOPHEN 325 MG PO TABS
650.0000 mg | ORAL_TABLET | Freq: Four times a day (QID) | ORAL | Status: DC | PRN
  Administered 2022-12-24 – 2022-12-26 (×6): 650 mg via ORAL
  Filled 2022-12-24 (×6): qty 2

## 2022-12-24 MED ORDER — SODIUM CHLORIDE 0.9 % IV SOLN
2.0000 g | INTRAVENOUS | Status: DC
  Administered 2022-12-24 – 2022-12-27 (×5): 2 g via INTRAVENOUS
  Filled 2022-12-24 (×4): qty 20

## 2022-12-24 NOTE — ED Triage Notes (Signed)
Pt arrived via POV from home c/o bladder pain, and urinary retention. Pt reports he had a foley removed recently, and since Friday he reports having difficulty micturating. Pt reports having multiple catheters placed in the past and has difficulty due to having an enlarged prostate.

## 2022-12-24 NOTE — ED Provider Notes (Signed)
[place] EMERGENCY DEPARTMENT AT [facility] Provider Note   CSN: [id number] Arrival date & time: 12/24/22  2027     History  Chief Complaint  Patient presents with   Urinary Retention    Micheal Baker is a 77 y.o. male with pmhx of BPH who presents to ED complaining of chills, suprapubic pain and urinary frequency. Patient states that he recently had an urinary catheter for 2 weeks that was removed 3 days ago. Denies nausea, vomiting, diarrhea, hematuria.  HPI     Home Medications Prior to Admission medications   Medication Sig Start Date End Date Taking? Authorizing Provider  acetaminophen (TYLENOL) 500 MG tablet Take 500-1,000 mg by mouth every 6 (six) hours as needed for mild pain or headache. Patient not taking: Reported on 12/21/2022    [provider]  ALPHA LIPOIC ACID PO Take 1 capsule by mouth in the morning.    [provider]  B Complex-C (B-COMPLEX WITH VITAMIN C) tablet Take 1 tablet by mouth in the morning.    [provider]  diclofenac Sodium (VOLTAREN) 1 % GEL Apply 1 Application topically 4 (four) times daily as needed (pain.).    [provider]  escitalopram (LEXAPRO) 20 MG tablet Take 20 mg by mouth daily.    [provider]  finasteride (PROSCAR) 5 MG tablet Take 1 tablet (5 mg total) by mouth daily. 12/21/22   McKenzie, Mardene Celeste, MD  fluticasone (FLONASE) 50 MCG/ACT nasal spray Place 1 spray into both nostrils daily as needed for allergies. 09/20/22   [provider]  GAVILYTE-G 236 g solution Take 4,000 mLs by mouth as directed. Patient not taking: Reported on 12/21/2022 11/01/22   [provider]  levothyroxine (SYNTHROID, LEVOTHROID) 75 MCG tablet Take 75 mcg by mouth daily before breakfast. 03/27/15   [provider]  meloxicam (MOBIC) 15 MG tablet Take 1 tablet (15 mg total) by mouth daily. Patient not taking: Reported on 12/21/2022 04/21/22   Hyatt, Max T, DPM   methylPREDNISolone (MEDROL DOSEPAK) 4 MG TBPK tablet Take by mouth. 11/18/22   [provider]  pantoprazole (PROTONIX) 40 MG tablet Take 40 mg by mouth daily at 4 PM. 09/20/22   [provider]  silodosin (RAPAFLO) 8 MG CAPS capsule Take 1 capsule (8 mg total) by mouth at bedtime. 10/07/22   McKenzie, Mardene Celeste, MD      Allergies    Avelox [moxifloxacin] and Penicillins    Review of Systems   Review of Systems  Physical Exam Updated Vital Signs BP (!) 167/96 (BP Location: Right Arm)   Pulse 98   Temp (!) 102.4 F (39.1 C) (Rectal)   Resp 17   Ht 6\' 5"  (1.956 m)   Wt 95.3 kg   SpO2 100%   BMI 24.90 kg/m  Physical Exam Vitals and nursing note reviewed.  Constitutional:      General: He is not in acute distress.    Appearance: Normal appearance. He is not ill-appearing or toxic-appearing.  HENT:     Head: Normocephalic and atraumatic.     Mouth/Throat:     Mouth: Mucous membranes are moist.     Pharynx: No oropharyngeal exudate or posterior oropharyngeal erythema.  Eyes:     General: No scleral icterus.       Right eye: No discharge.        Left eye: No discharge.     Conjunctiva/sclera: Conjunctivae normal.  Cardiovascular:     Rate and  Rhythm: Normal rate.     Pulses: Normal pulses.     Heart sounds: No murmur heard. Pulmonary:     Effort: Pulmonary effort is normal. No respiratory distress.     Breath sounds: No wheezing, rhonchi or rales.  Abdominal:     General: Abdomen is flat. Bowel sounds are normal.     Palpations: Abdomen is soft.     Tenderness: There is abdominal tenderness.     Comments: Suprapubic tenderness to palpation  Musculoskeletal:     Right lower leg: No edema.     Left lower leg: No edema.  Skin:    General: Skin is warm and dry.     Findings: No rash.  Neurological:     General: No focal deficit present.     Mental Status: He is alert. Mental status is at baseline.  Psychiatric:        Mood and Affect: Mood normal.         Behavior: Behavior normal.     ED Results / Procedures / Treatments   Labs (all labs ordered are listed, but only abnormal results are displayed) Labs Reviewed  URINALYSIS, ROUTINE W REFLEX MICROSCOPIC - Abnormal; Notable for the following components:      Result Value   APPearance HAZY (*)    Hgb urine dipstick SMALL (*)    Protein, ur 30 (*)    Leukocytes,Ua MODERATE (*)    Bacteria, UA RARE (*)    All other components within normal limits  CBC - Abnormal; Notable for the following components:   WBC 12.8 (*)    All other components within normal limits  BASIC METABOLIC PANEL - Abnormal; Notable for the following components:   Sodium 134 (*)    Glucose, Bld 111 (*)    All other components within normal limits  URINE CULTURE  CULTURE, BLOOD (ROUTINE X 2)  CULTURE, BLOOD (ROUTINE X 2)  LACTIC ACID, PLASMA  LACTIC ACID, PLASMA    EKG None  Radiology No results found.  Procedures .Critical Care  Performed by: Dorthy Cooler, PA-C Authorized by: Dorthy Cooler, PA-C   Critical care provider statement:    Critical care time (minutes):  30   Critical care was necessary to treat or prevent imminent or life-threatening deterioration of the following conditions:  Sepsis   Critical care was time spent personally by me on the following activities:  Development of treatment plan with patient or surrogate, discussions with consultants, evaluation of patient's response to treatment, examination of patient, ordering and review of laboratory studies, ordering and review of radiographic studies, ordering and performing treatments and interventions, pulse oximetry, re-evaluation of patient's condition, review of old charts and obtaining history from patient or surrogate   I assumed direction of critical care for this patient from another provider in my specialty: no     Care discussed with: admitting provider       Medications Ordered in ED Medications  cefTRIAXone  (ROCEPHIN) 2 g in sodium chloride 0.9 % 100 mL IVPB (2 g Intravenous New Bag/Given 12/24/22 2307)  acetaminophen (TYLENOL) tablet 650 mg (650 mg Oral Given 12/24/22 2323)  LORazepam (ATIVAN) injection 0.5 mg (0.5 mg Intravenous Given 12/24/22 2323)    ED Course/ Medical Decision Making/ A&P                             Medical Decision Making Amount and/or Complexity of Data Reviewed Labs: ordered.  Risk OTC drugs. Prescription drug management. Decision regarding hospitalization.    This patient presents to the ED for concern of chills, urinary frequency, and suprapubic tenderness this involves an extensive number of treatment options, and is a complaint that carries with it a high risk of complications and morbidity.  The differential diagnosis nephrolithiasis, UTI, pyleonephritis, sepsis  Co morbidities that complicate the patient evaluation  BPH   Lab Tests:  I Ordered, and personally interpreted labs.  The pertinent results include:   CBC: WBC (12.8) BMP: without concern of AKI UA: concerning for UTI Urine Culture: pending Blood culture: pending Lactic acid: pending   Imaging Studies ordered:  I ordered imaging studies including  Bladder scan: ~20cc residual fluids post void   Problem List / ED Course / Critical interventions / Medication management  9:10PM Patient presented for urinary frequency, chills, and suprapubic pain. On exam patient was afebrile, with stable vitals, and with mild tenderness to palpation of suprapubic area. Bladder scan without concern of urine retention. Obtaining CBC, CMP, UA, and Urine Culture at this time.  10:45PM Patient complaining of rigors. UA concerning for UTI. CBC concerning with WBC >12. Re-assessed patient Rectal temperature 102.4. Tylenol given. Patient meets sepsis criteria. Septic workup initiated and patient given IV ABX. Patient also seeming more anxious, so I have given him medication to help ease anxiety.  Given  patient's history of urosepsis and current vital signs, I am concerned for UTI complicated by sepsis. Hospitalist consult obtained and agreed to admit patient for further treatment and workup.    Social Determinants of Health:  none          Final Clinical Impression(s) / ED Diagnoses Final diagnoses:  Sepsis, due to unspecified organism, unspecified whether acute organ dysfunction present  Acute cystitis without hematuria    Rx / DC Orders ED Discharge Orders     None         Margarita Rana 12/24/22 2330    Eber Hong, MD 12/26/22 2040

## 2022-12-24 NOTE — ED Notes (Signed)
ED Provider at bedside. 

## 2022-12-24 NOTE — ED Notes (Signed)
Went to place a malewick on pt and noticed redness and swelling to the pts testicles. Paramedic and PA notified.

## 2022-12-25 DIAGNOSIS — K219 Gastro-esophageal reflux disease without esophagitis: Secondary | ICD-10-CM | POA: Diagnosis not present

## 2022-12-25 DIAGNOSIS — E039 Hypothyroidism, unspecified: Secondary | ICD-10-CM

## 2022-12-25 DIAGNOSIS — A419 Sepsis, unspecified organism: Secondary | ICD-10-CM

## 2022-12-25 DIAGNOSIS — N390 Urinary tract infection, site not specified: Secondary | ICD-10-CM

## 2022-12-25 DIAGNOSIS — F32A Depression, unspecified: Secondary | ICD-10-CM

## 2022-12-25 DIAGNOSIS — F419 Anxiety disorder, unspecified: Secondary | ICD-10-CM

## 2022-12-25 LAB — CBC WITH DIFFERENTIAL/PLATELET
Abs Immature Granulocytes: 0.07 K/uL (ref 0.00–0.07)
Basophils Absolute: 0 K/uL (ref 0.0–0.1)
Basophils Relative: 0 %
Eosinophils Absolute: 0 K/uL (ref 0.0–0.5)
Eosinophils Relative: 0 %
HCT: 40.7 % (ref 39.0–52.0)
Hemoglobin: 14 g/dL (ref 13.0–17.0)
Immature Granulocytes: 1 %
Lymphocytes Relative: 6 %
Lymphs Abs: 0.7 K/uL (ref 0.7–4.0)
MCH: 30.2 pg (ref 26.0–34.0)
MCHC: 34.4 g/dL (ref 30.0–36.0)
MCV: 87.9 fL (ref 80.0–100.0)
Monocytes Absolute: 1.2 K/uL — ABNORMAL HIGH (ref 0.1–1.0)
Monocytes Relative: 9 %
Neutro Abs: 11.5 K/uL — ABNORMAL HIGH (ref 1.7–7.7)
Neutrophils Relative %: 84 %
Platelets: 226 K/uL (ref 150–400)
RBC: 4.63 MIL/uL (ref 4.22–5.81)
RDW: 13.1 % (ref 11.5–15.5)
WBC: 13.5 K/uL — ABNORMAL HIGH (ref 4.0–10.5)
nRBC: 0 % (ref 0.0–0.2)

## 2022-12-25 LAB — COMPREHENSIVE METABOLIC PANEL WITH GFR
ALT: 19 U/L (ref 0–44)
AST: 21 U/L (ref 15–41)
Albumin: 3.6 g/dL (ref 3.5–5.0)
Alkaline Phosphatase: 56 U/L (ref 38–126)
Anion gap: 10 (ref 5–15)
BUN: 14 mg/dL (ref 8–23)
CO2: 24 mmol/L (ref 22–32)
Calcium: 8.7 mg/dL — ABNORMAL LOW (ref 8.9–10.3)
Chloride: 104 mmol/L (ref 98–111)
Creatinine, Ser: 0.93 mg/dL (ref 0.61–1.24)
GFR, Estimated: >60 mL/min
Glucose, Bld: 103 mg/dL — ABNORMAL HIGH (ref 70–99)
Potassium: 3.1 mmol/L — ABNORMAL LOW (ref 3.5–5.1)
Sodium: 138 mmol/L (ref 135–145)
Total Bilirubin: 1 mg/dL (ref 0.3–1.2)
Total Protein: 6.5 g/dL (ref 6.5–8.1)

## 2022-12-25 LAB — TSH: TSH: 1.865 u[IU]/mL (ref 0.350–4.500)

## 2022-12-25 LAB — LACTIC ACID, PLASMA: Lactic Acid, Venous: 1.1 mmol/L (ref 0.5–1.9)

## 2022-12-25 LAB — MAGNESIUM: Magnesium: 2 mg/dL (ref 1.7–2.4)

## 2022-12-25 MED ORDER — PANTOPRAZOLE SODIUM 40 MG PO TBEC
40.0000 mg | DELAYED_RELEASE_TABLET | Freq: Every day | ORAL | Status: DC
  Administered 2022-12-25 – 2022-12-27 (×3): 40 mg via ORAL
  Filled 2022-12-25 (×3): qty 1

## 2022-12-25 MED ORDER — ESCITALOPRAM OXALATE 10 MG PO TABS
20.0000 mg | ORAL_TABLET | Freq: Every day | ORAL | Status: DC
  Administered 2022-12-25 – 2022-12-28 (×4): 20 mg via ORAL
  Filled 2022-12-25 (×4): qty 2

## 2022-12-25 MED ORDER — ONDANSETRON HCL 4 MG/2ML IJ SOLN: 4.0000 mg | Freq: Four times a day (QID) | INTRAMUSCULAR | Status: DC | PRN

## 2022-12-25 MED ORDER — FINASTERIDE 5 MG PO TABS
5.0000 mg | ORAL_TABLET | Freq: Every day | ORAL | Status: DC
  Administered 2022-12-25 – 2022-12-28 (×4): 5 mg via ORAL
  Filled 2022-12-25 (×4): qty 1

## 2022-12-25 MED ORDER — HEPARIN SODIUM (PORCINE) 5000 UNIT/ML IJ SOLN
5000.0000 [IU] | Freq: Three times a day (TID) | INTRAMUSCULAR | Status: DC
  Administered 2022-12-25 – 2022-12-28 (×9): 5000 [IU] via SUBCUTANEOUS
  Filled 2022-12-25 (×10): qty 1

## 2022-12-25 MED ORDER — POTASSIUM CHLORIDE CRYS ER 20 MEQ PO TBCR
40.0000 meq | EXTENDED_RELEASE_TABLET | Freq: Once | ORAL | Status: AC
  Administered 2022-12-25: 40 meq via ORAL
  Filled 2022-12-25: qty 2

## 2022-12-25 MED ORDER — ONDANSETRON HCL 4 MG PO TABS: 4.0000 mg | ORAL_TABLET | Freq: Four times a day (QID) | ORAL | Status: DC | PRN

## 2022-12-25 MED ORDER — OXYCODONE HCL 5 MG PO TABS
5.0000 mg | ORAL_TABLET | ORAL | Status: DC | PRN
  Administered 2022-12-25 – 2022-12-28 (×6): 5 mg via ORAL
  Filled 2022-12-25 (×6): qty 1

## 2022-12-25 MED ORDER — MORPHINE SULFATE (PF) 2 MG/ML IV SOLN: 2.0000 mg | INTRAVENOUS | Status: DC | PRN

## 2022-12-25 MED ORDER — SODIUM CHLORIDE 0.9 % IV SOLN: INTRAVENOUS | Status: DC

## 2022-12-25 MED ORDER — TAMSULOSIN HCL 0.4 MG PO CAPS
0.4000 mg | ORAL_CAPSULE | Freq: Every day | ORAL | Status: DC
  Administered 2022-12-25 – 2022-12-28 (×4): 0.4 mg via ORAL
  Filled 2022-12-25 (×5): qty 1

## 2022-12-25 MED ORDER — MORPHINE SULFATE (PF) 2 MG/ML IV SOLN
1.0000 mg | INTRAVENOUS | Status: DC | PRN
  Administered 2022-12-27: 1 mg via INTRAVENOUS
  Filled 2022-12-25: qty 1

## 2022-12-25 MED ORDER — LEVOTHYROXINE SODIUM 75 MCG PO TABS
75.0000 ug | ORAL_TABLET | Freq: Every day | ORAL | Status: DC
  Administered 2022-12-25 – 2022-12-28 (×4): 75 ug via ORAL
  Filled 2022-12-25: qty 3
  Filled 2022-12-25 (×3): qty 1

## 2022-12-25 NOTE — Plan of Care (Signed)
  Problem: Acute Rehab PT Goals(only PT should resolve) Goal: Pt Will Go Supine/Side To Sit Outcome: Progressing Flowsheets (Taken 12/25/2022 1202) Pt will go Supine/Side to Sit: Independently Goal: Patient Will Transfer Sit To/From Stand Outcome: Progressing Flowsheets (Taken 12/25/2022 1202) Patient will transfer sit to/from stand: Independently Goal: Pt Will Transfer Bed To Chair/Chair To Bed Outcome: Progressing Flowsheets (Taken 12/25/2022 1202) Pt will Transfer Bed to Chair/Chair to Bed: Independently Goal: Pt Will Ambulate Outcome: Progressing Flowsheets (Taken 12/25/2022 1202) Pt will Ambulate:  > 125 feet  Independently  with modified independence  with least restrictive assistive device   12:03 PM, 12/25/22 Ocie Bob, MPT Physical Therapist with [facility] [phone] office [phone] mobile phone

## 2022-12-25 NOTE — Progress Notes (Signed)
ASSUMPTION OF CARE NOTE   12/25/2022 1:07 PM  Micheal Baker was seen and examined.  The H&P by the admitting provider, orders, imaging was reviewed.  Please see new orders.  Will continue to follow.   Vitals:   12/25/22 0014 12/25/22 0537  BP: 134/73 121/81  Pulse: 94 88  Resp: 20   Temp: 99 F (37.2 C) 98.5 F (36.9 C)  SpO2: 97% 97%    Results for orders placed or performed during the hospital encounter of 12/24/22  Blood Culture (routine x 2)   Specimen: Right Antecubital; Blood  Result Value Ref Range   Specimen Description      RIGHT ANTECUBITAL BOTTLES DRAWN AEROBIC AND ANAEROBIC   Special Requests      Blood Culture adequate volume Performed at [facility], [address], [place], [place] [address]    Culture PENDING    Report Status PENDING   Blood Culture (routine x 2)   Specimen: Left Antecubital; Blood  Result Value Ref Range   Specimen Description      LEFT ANTECUBITAL BOTTLES DRAWN AEROBIC AND ANAEROBIC   Special Requests      Blood Culture adequate volume Performed at [facility], [address], [place], [place] [address]    Culture PENDING    Report Status PENDING   Urinalysis, Routine w reflex microscopic -Urine, Clean Catch  Result Value Ref Range   Color, Urine YELLOW YELLOW   APPearance HAZY (A) CLEAR   Specific Gravity, Urine 1.012 1.005 - 1.030   pH 7.0 5.0 - 8.0   Glucose, UA NEGATIVE NEGATIVE mg/dL   Hgb urine dipstick SMALL (A) NEGATIVE   Bilirubin Urine NEGATIVE NEGATIVE   Ketones, ur NEGATIVE NEGATIVE mg/dL   Protein, ur 30 (A) NEGATIVE mg/dL   Nitrite NEGATIVE NEGATIVE   Leukocytes,Ua MODERATE (A) NEGATIVE   RBC / HPF 6-10 0 - 5 RBC/hpf   WBC, UA >50 0 - 5 WBC/hpf   Bacteria, UA RARE (A) NONE SEEN   Squamous Epithelial / HPF 0-5 0 - 5 /HPF  CBC  Result Value Ref Range   WBC 12.8 (H) 4.0 - 10.5 K/uL   RBC 4.82 4.22 - 5.81 MIL/uL   Hemoglobin 14.4 13.0 - 17.0 g/dL   HCT 09.8 11.9 - 14.7 %   MCV 86.9 80.0 - 100.0 fL   MCH 29.9  26.0 - 34.0 pg   MCHC 34.4 30.0 - 36.0 g/dL   RDW 82.9 56.2 - 13.0 %   Platelets 231 150 - 400 K/uL   nRBC 0.0 0.0 - 0.2 %  Basic metabolic panel  Result Value Ref Range   Sodium 134 (L) 135 - 145 mmol/L   Potassium 4.4 3.5 - 5.1 mmol/L   Chloride 101 98 - 111 mmol/L   CO2 26 22 - 32 mmol/L   Glucose, Bld 111 (H) 70 - 99 mg/dL   BUN 17 8 - 23 mg/dL   Creatinine, Ser 8.65 0.61 - 1.24 mg/dL   Calcium 9.1 8.9 - 78.4 mg/dL   GFR, Estimated >69 >62 mL/min   Anion gap 7 5 - 15  Lactic acid, plasma  Result Value Ref Range   Lactic Acid, Venous 1.7 0.5 - 1.9 mmol/L  Lactic acid, plasma  Result Value Ref Range   Lactic Acid, Venous 1.1 0.5 - 1.9 mmol/L  Comprehensive metabolic panel  Result Value Ref Range   Sodium 138 135 - 145 mmol/L   Potassium 3.1 (L) 3.5 - 5.1 mmol/L  Chloride 104 98 - 111 mmol/L   CO2 24 22 - 32 mmol/L   Glucose, Bld 103 (H) 70 - 99 mg/dL   BUN 14 8 - 23 mg/dL   Creatinine, Ser 4.09 0.61 - 1.24 mg/dL   Calcium 8.7 (L) 8.9 - 10.3 mg/dL   Total Protein 6.5 6.5 - 8.1 g/dL   Albumin 3.6 3.5 - 5.0 g/dL   AST 21 15 - 41 U/L   ALT 19 0 - 44 U/L   Alkaline Phosphatase 56 38 - 126 U/L   Total Bilirubin 1.0 0.3 - 1.2 mg/dL   GFR, Estimated >81 >19 mL/min   Anion gap 10 5 - 15  Magnesium  Result Value Ref Range   Magnesium 2.0 1.7 - 2.4 mg/dL  CBC with Differential/Platelet  Result Value Ref Range   WBC 13.5 (H) 4.0 - 10.5 K/uL   RBC 4.63 4.22 - 5.81 MIL/uL   Hemoglobin 14.0 13.0 - 17.0 g/dL   HCT 14.7 82.9 - 56.2 %   MCV 87.9 80.0 - 100.0 fL   MCH 30.2 26.0 - 34.0 pg   MCHC 34.4 30.0 - 36.0 g/dL   RDW 13.0 86.5 - 78.4 %   Platelets 226 150 - 400 K/uL   nRBC 0.0 0.0 - 0.2 %   Neutrophils Relative % 84 %   Neutro Abs 11.5 (H) 1.7 - 7.7 K/uL   Lymphocytes Relative 6 %   Lymphs Abs 0.7 0.7 - 4.0 K/uL   Monocytes Relative 9 %   Monocytes Absolute 1.2 (H) 0.1 - 1.0 K/uL   Eosinophils Relative 0 %   Eosinophils Absolute 0.0 0.0 - 0.5 K/uL   Basophils  Relative 0 %   Basophils Absolute 0.0 0.0 - 0.1 K/uL   Immature Granulocytes 1 %   Abs Immature Granulocytes 0.07 0.00 - 0.07 K/uL  TSH  Result Value Ref Range   TSH 1.865 0.350 - 4.500 uIU/mL    C. Laural Benes, MD [facility]   12/24/2022  8:48 PM How to contact the [facility] Attending or Consulting provider 7A - 7P or covering provider during after hours 7P -7A, for this patient?  Check the care team in [facility] and look for a) attending/consulting [facility] provider listed and b) the [facility] team listed Log into [email or web address] and use [place]'s universal password to access. If you do not have the password, please contact the hospital operator. Locate the [facility] provider you are looking for under [facility] and page to a number that you can be directly reached. If you still have difficulty reaching the provider, please page the [facility] (Director on Call) for the Hospitalists listed on amion for assistance.

## 2022-12-25 NOTE — H&P (Signed)
History and Physical    Patient: Micheal Baker VVO:[id number] DOB: 1945-09-29 DOA: 12/24/2022 DOS: the patient was seen and examined on 12/25/2022 PCP: [name], MD  Patient coming from: Home  Chief Complaint:  Chief Complaint  Patient presents with   Urinary Retention   HPI: Micheal Baker is a 77 y.o. male with medical history significant of depression, anxiety, GERD, hypertension, hypothyroidism, urinary retention requiring Foley catheter that was just removed April 10, and more presents the ED with a chief complaint of dysuria.  Patient reports he had dysuria, subjective fever, chills, rigors at home.  He thought he had some hematuria a few days ago, but has not noticed any recently.  His urine is dark at bedside.  He reports generalized weakness, malaise, fatigue.  He had a normal appetite.  He reports that he had a UTI in the past and it was last year.  At that time he was hospitalized and he thinks he has been significantly deconditioned since then.  He reports the symptoms are the same as the symptoms he had then.  On review of systems he complains of headache.  He has seen ophthalmology for it and he think it is because of dry eyes.  He is using eyedrops.  He reports Tylenol has helped with the headache at home.  The headache is present now so we requested his nurse to dispense Tylenol.  Patient really does not have any other specific complaints at this time.  Patient does complain that he has not been the same since he had urosepsis a year ago.  He reports at that time he was working a part-time job, and he has been so weak, fatigued, and never the same again, so he has not even been able to continue his part-time job.  He thinks that several health problems have happened due to having been deconditioned after his hospitalization for sepsis.  He is interested in rehab, hoping for to be at home.  Patient does not smoke.  He drinks about once per month.  He does not use illicit drugs.  He  is vaccinated for COVID.  Patient is full code. Review of Systems: As mentioned in the history of present illness. All other systems reviewed and are negative. Past Medical History:  Diagnosis Date   Acute medial meniscus tear of left knee    Depression    Frequent PVCs    GERD (gastroesophageal reflux disease)    Hypertension    Hypothyroidism    NSVT (nonsustained ventricular tachycardia)    Osteoarthritis    Thyroid disease    Past Surgical History:  Procedure Laterality Date   BIOPSY  12/01/2022   Procedure: BIOPSY;  Surgeon: Lanelle Bal, DO;  Location: [facility];  Service: Endoscopy;;   CATARACT EXTRACTION     left   COLONOSCOPY N/A 04/08/2016   Surgeon: West Bali, MD; one 6 mm polyp in the cecum removed, one 4 mm polyp in the descending colon removed, nonbleeding internal hemorrhoids.  Pathology with hyperplastic polyps.  Recommended colonoscopy in 10 years.   COLONOSCOPY  12/01/2022   COLONOSCOPY WITH PROPOFOL N/A 12/01/2022   Procedure: COLONOSCOPY WITH PROPOFOL;  Surgeon: Lanelle Bal, DO;  Location: [facility];  Service: Endoscopy;  Laterality: N/A;  8:15 am, asa 3   endosocopy  12/01/2022   ESOPHAGOGASTRODUODENOSCOPY (EGD) WITH PROPOFOL N/A 12/01/2022   Procedure: ESOPHAGOGASTRODUODENOSCOPY (EGD) WITH PROPOFOL;  Surgeon: Lanelle Bal, DO;  Location: [facility];  Service: Endoscopy;  Laterality: N/A;   EYE SURGERY     left-scar tissue   groin surgery Right 2010   growth removed    KNEE ARTHROSCOPY     left   KNEE ARTHROSCOPY WITH LATERAL MENISECTOMY Right 03/11/2015   Procedure: RIGHT KNEE ARTHROSCOPY WITH MEDIALAND LATERAL MENISECTOMY;  Surgeon: Salvatore Marvel, MD;  Location: [facility];  Service: Orthopedics;  Laterality: Right;   KNEE ARTHROSCOPY WITH MEDIAL MENISECTOMY Left 06/27/2014   Procedure: LEFT KNEE ARTHROSCOPY WITH PARTIAL MEDIAL AND LATERAL MENISECTOMIES AND CHONDROPLASTY;  Surgeon: Nilda Simmer, MD;  Location:  [facility];  Service: Orthopedics;  Laterality: Left;   KNEE ARTHROSCOPY WITH MEDIAL MENISECTOMY Right 03/11/2015   Procedure: KNEE ARTHROSCOPY WITH MEDIAL MENISECTOMY;  Surgeon: Salvatore Marvel, MD;  Location:  SURGERY CENTER;  Service: Orthopedics;  Laterality: Right;   LEFT HEART CATHETERIZATION WITH CORONARY ANGIOGRAM N/A 09/14/2011   Procedure: LEFT HEART CATHETERIZATION WITH CORONARY ANGIOGRAM;  Surgeon: [name], MD;  Location: [facility] C[facility];  Service: Cardiovascular;  Laterality: N/A;   POLYPECTOMY  04/08/2016   Procedure: POLYPECTOMY;  Surgeon: West Bali, MD;  Location: [facility];  Service: Endoscopy;;  colon    POLYPECTOMY  12/01/2022   POLYPECTOMY  12/01/2022   Procedure: POLYPECTOMY;  Surgeon: Lanelle Bal, DO;  Location: [facility];  Service: Endoscopy;;   RETINAL DETACHMENT SURGERY Left 2002   Dr. Hermina Barters   Social History:  reports that he has never smoked. He has never used smokeless tobacco. He reports current alcohol use. He reports that he does not use drugs.  Allergies  Allergen Reactions   Avelox [Moxifloxacin] Other (See Comments)    Caused tendonitis   Penicillins Other (See Comments)    Chills     Family History  Problem Relation Age of Onset   Hypertension Mother    Hypertension Father    Heart attack Father    Diabetes Father    Asthma Daughter    Colon cancer Neg Hx    Stomach cancer Neg Hx     Prior to Admission medications   Medication Sig Start Date End Date Taking? Authorizing Provider  acetaminophen (TYLENOL) 500 MG tablet Take 500-1,000 mg by mouth every 6 (six) hours as needed for mild pain or headache. Patient not taking: Reported on 12/21/2022    [provider]  ALPHA LIPOIC ACID PO Take 1 capsule by mouth in the morning.    [provider]  B Complex-C (B-COMPLEX WITH VITAMIN C) tablet Take 1 tablet by mouth in the morning.    [provider]  diclofenac Sodium  (VOLTAREN) 1 % GEL Apply 1 Application topically 4 (four) times daily as needed (pain.).    [provider]  escitalopram (LEXAPRO) 20 MG tablet Take 20 mg by mouth daily.    [provider]  finasteride (PROSCAR) 5 MG tablet Take 1 tablet (5 mg total) by mouth daily. 12/21/22   McKenzie, Mardene Celeste, MD  fluticasone (FLONASE) 50 MCG/ACT nasal spray Place 1 spray into both nostrils daily as needed for allergies. 09/20/22   [provider]  GAVILYTE-G 236 g solution Take 4,000 mLs by mouth as directed. Patient not taking: Reported on 12/21/2022 11/01/22   [provider]  levothyroxine (SYNTHROID, LEVOTHROID) 75 MCG tablet Take 75 mcg by mouth daily before breakfast. 03/27/15   [provider]  meloxicam (MOBIC) 15 MG tablet Take 1 tablet (15 mg total) by mouth daily. Patient  not taking: Reported on 12/21/2022 04/21/22   Hyatt, Max T, DPM  methylPREDNISolone (MEDROL DOSEPAK) 4 MG TBPK tablet Take by mouth. 11/18/22   [provider]  pantoprazole (PROTONIX) 40 MG tablet Take 40 mg by mouth daily at 4 PM. 09/20/22   [provider]  silodosin (RAPAFLO) 8 MG CAPS capsule Take 1 capsule (8 mg total) by mouth at bedtime. 10/07/22   Malen Gauze, MD    Physical Exam: Vitals:   12/24/22 2259 12/24/22 2348 12/25/22 0014 12/25/22 0537  BP:  (!) 164/84 134/73 121/81  Pulse:  99 94 88  Resp:  19 20   Temp: (!) 102.4 F (39.1 C)  99 F (37.2 C) 98.5 F (36.9 C)  TempSrc: Rectal  Oral Oral  SpO2:  99% 97% 97%  Weight:      Height:       1.  General: Patient lying supine in bed,  no acute distress   2. Psychiatric: Alert and oriented x 3, mood and behavior normal for situation, pleasant and cooperative with exam   3. Neurologic: Speech and language are normal, face is symmetric, moves all 4 extremities voluntarily, at baseline without acute deficits on limited exam   4. HEENMT:  Head is atraumatic, normocephalic, pupils reactive to  light, neck is supple, trachea is midline, mucous membranes are moist   5. Respiratory : Lungs are clear to auscultation bilaterally without wheezing, rhonchi, rales, no cyanosis, no increase in work of breathing or accessory muscle use   6. Cardiovascular : Heart rate normal, rhythm is regular, no murmurs, rubs or gallops, no peripheral edema, peripheral pulses palpated   7. Gastrointestinal:  Abdomen is soft, nondistended, nontender to palpation bowel sounds active, no masses or organomegaly palpated   8. Skin:  Skin is warm, dry and intact without rashes, acute lesions, or ulcers on limited exam   9.Musculoskeletal:  Symmetric muscle atrophy of lower extremities  Data Reviewed: In the ED Temp 99.4-102, heart rate 17, blood pressure 167/96 Leukocytosis at 12.8, hemoglobin stable at 14.4, platelets 231 Chemistries unremarkable UA indicative of UTI Patient was started on Rocephin and given a dose of Ativan and admission was requested to the hospital  Assessment and Plan: * Sepsis due to urinary tract infection - Patient was febrile to 102 with a leukocytosis of 12.8 - To medic with dysuria, urinary incontinence, fever, chills, rigors - UA indicative of UTI - Urine culture pending - Blood culture pending - Continue Rocephin - Patient recently had Foley catheter removed approximately 4 days ago - Continue to monitor  Hypothyroidism - Continue Synthroid  Anxiety and depression - Patient required a dose of Ativan in the ER - Continue Lexapro - Continue to monitor  GERD (gastroesophageal reflux disease) - Continue PPI      Advance Care Planning:   Code Status: Full Code  Consults: PT and TOC  Family Communication: No family at bedside  Severity of Illness: The appropriate patient status for this patient is INPATIENT. Inpatient status is judged to be reasonable and necessary in order to provide the required intensity of service to ensure the patient's safety. The  patient's presenting symptoms, physical exam findings, and initial radiographic and laboratory data in the context of their chronic comorbidities is felt to place them at high risk for further clinical deterioration. Furthermore, it is not anticipated that the patient will be medically stable for discharge from the hospital within 2 midnights of admission.   * I certify that at the  point of admission it is my clinical judgment that the patient will require inpatient hospital care spanning beyond 2 midnights from the point of admission due to high intensity of service, high risk for further deterioration and high frequency of surveillance required.*  Author: Lilyan Gilford, DO 12/25/2022 6:25 AM  For on call review [email or web address].

## 2022-12-25 NOTE — Assessment & Plan Note (Signed)
-   Patient required a dose of Ativan in the ER - Continue Lexapro - Continue to monitor

## 2022-12-25 NOTE — Discharge Instructions (Signed)
Micheal Baker will call to schedule HHPT visits.   Nutrition Post Hospital Stay Proper nutrition can help your body recover from illness and injury.   Foods and beverages high in protein, vitamins, and minerals help rebuild muscle loss, promote healing, & reduce fall risk.   In addition to eating healthy foods, a nutrition shake is an easy, delicious way to get the nutrition you need during and after your hospital stay  It is recommended that you continue to drink 2 bottles per day of: Ensure Plus/Boost Plus/Carnation Breakfast Essentials for at least 1 month (30 days) after your hospital stay   Tips for adding a nutrition shake into your routine: As allowed, drink one with vitamins or medications instead of water or juice Enjoy one as a tasty mid-morning or afternoon snack Drink cold or make a milkshake out of it Drink one instead of milk with cereal or snacks Use as a coffee creamer   Available at the following grocery stores and pharmacies:           * Karin Golden * [employer] * [employer]  * [employer]          * [employer] * [employer]  * [facility]      * Target  * [employer]   * [facility]  * [employer]   * [facility] [phone]            For COUPONS visit: [email or web address]/join or [email or web address]   Suggested Substitutions Ensure Plus = Boost Plus = Carnation Breakfast Essentials = Boost Compact Ensure Active Clear = Boost Breeze Glucerna Shake = Boost Glucose Control = Carnation Breakfast Essentials SUGAR FREE      High-Calorie, High-Protein Nutrition Therapy (2021)  A high-calorie, high-protein diet has been recommended to you. Your registered dietitian nutritionist (RDN) may have recommended this diet because you are having difficulty eating enough calories throughout the day, you have lost weight, and/or you need to add protein to your diet. Sometimes you may not feel like eating, even if you know the importance of good nutrition. The recommendations in this  handout can help you with the following: Regaining your strength and energy Keeping your body healthy Healing and recovering from surgery or illness and fighting infection  Tips: Schedule Your Meals and Snacks Several small meals and snacks are often better tolerated and digested than large meals. Strategies Plan to eat 3 meals and 3 snacks daily. Experiment with timing meals to find out when you have a larger appetite. Appetite may be greatest in the morning after not eating all night so you may prefer to eat your larger meals and snacks in the morning and at lunch. Breakfast-type foods are often better tolerated so eat foods such as eggs, pancakes, waffles and cereal for any meal or snack. Carry snacks with you so you are prepared to eat every 2 to 3 hours. Determine what works best for you if your body's cues for feeling hungry or full are not working. Eat a small meal or snack even if you don't feel hungry. Set a timer to remind you when it is time to eat. Take a walk before you eat (with health care provider's approval). Light or moderate physical activity can help you maintain muscle and increase your appetite.  Make Eating Enjoyable Taking steps to make the experience enjoyable may help to increase your interest in eating and improve your appetite. Strategies: Eat with others whenever possible. Include your favorite foods to make meals more enjoyable.  Try new foods. Save your beverage for the end of the meal so that you have more room for food before you get full.  Add Calories to Your Meals and Snacks Try adding calorie-dense foods so that each bite provides more nutrition. Strategies Drink milk, chocolate milk, soy milk, or smoothies instead of low-calorie beverages such as diet drinks or water. Cook with milk or soy milk instead of water when making dishes such as hot cereal, cocoa, or pudding. Add jelly, jam, honey, butter or margarine to bread and crackers. Add jam or  fruit to ice cream and as a topping over cake. Mix dried fruit, nuts, granola, honey, or dry cereal with yogurt or hot cereals. Enjoy snacks such as milkshakes, smoothies, pudding, ice cream, or custard. Blend a fruit smoothie of a banana, frozen berries, milk or soy milk, and 1 tablespoon nonfat powdered milk or protein powder.  Add Protein to Your Meals and Snacks Choose at least one protein food at each meal and snack to increase your daily intake. Strategies Add  cup nonfat dry milk powder or protein powder to make a high-protein milk to drink or to use in recipes that call for milk. Vanilla or peppermint extract or unsweetened cocoa powder could help to boost the flavor. Add hard-cooked eggs, leftover meat, grated cheese, canned beans or tofu to noodles, rice, salads, sandwiches, soups, casseroles, pasta, tuna and other mixed dishes. Add powdered milk or protein powder to hot cereals, meatloaf, casseroles, scrambled eggs, sauces, cream soups, and shakes. Add beans and lentils to salads, soups, casseroles, and vegetable dishes. Eat cottage cheese or yogurt, especially Greek yogurt, with fruit as a snack or dessert. Eat peanut or other nut butters on crackers, bread, toast, waffles, apples, bananas or celery sticks. Add it to milkshakes, smoothies, or desserts. Consider a ready-made protein shake. Your RDN will make recommendations.  Add Fats to Your Meals and Snacks Try adding fats to your meals and snacks. Fat provides more calories in fewer bites than carbohydrate or protein and adds flavors to your foods. Strategies Snack on nuts and seeds or add them to foods like salads, pasta, cereals, yogurt, and ice cream.  Saut or stir-fry vegetables, meats, chicken, fish or tofu in olive or canola oil.  Add olive oil, other vegetable oils, butter or margarine to soups, vegetables, potatoes, cooked cereal, rice, pasta, bread, crackers, pancakes, or waffles. Snack on olives or add to pasta, pizza,  or salad. Add avocado or guacamole to your salads, sandwiches, and other entrees. Include fatty fish such as salmon in your weekly meal plan. For general food safety tips, especially for clients with immunocompromised conditions, ask your RDN for the Food Safety Nutrition Therapy handout.  Small Meal and Snack Ideas These snacks and meals are recommended when you have to eat but aren't necessarily hungry.  They are good choices because they are high in protein and high in calories.  2 graham crackers 2 tablespoons peanut or other nut butter 1 cup milk 2 slices whole wheat toast topped with:  avocado, mashed Seasoning of your choice   cup Greek yogurt  cup fruit  cup granola 2 deviled egg halves 5 whole wheat crackers  1 cup cream of tomato soup  grilled cheese sandwich 1 toasted waffle topped with: 2 tablespoons peanut or nut butter 1 tablespoon jam  Trail mix made with:  cup nuts  cup dried fruit  cup cold cereal, any variety  cup oatmeal or cream of wheat cereal 1 tablespoon peanut  or nut butter  cup diced fruit   High-Calorie, High-Protein Sample 1-Day Menu View Nutrient Info Breakfast 1 egg, scrambled 1 ounce cheddar cheese 1 English muffin, whole wheat 1 tablespoon margarine 1 tablespoon jam  cup orange juice, fortified with calcium and vitamin D  Morning Snack 1 tablespoon peanut butter 1 banana 1 cup 1% milk  Lunch Tuna salad sandwich made with: 2 slices bread, whole wheat 3 ounces tuna mixed with: 1 tablespoon mayonnaise  cup pudding  Afternoon Snack  cup hummus  cup carrots 1 pita  Evening Meal Enchilada casserole made with: 2 corn tortillas 3 ounces ground beef, cooked  cup black beans, cooked  cup corn, cooked 1 ounce grated cheddar cheese  cup enchilada sauce  avocado, sliced, topping for enchilada 1 tablespoon sour cream, topping for enchilada Salad:  cup lettuce, shredded  cup tomatoes, chopped, for salad 1 tablespoon olive oil  and vinegar dressing, for salad  Evening Snack  cup Greek yogurt  cup blueberries  cup granola

## 2022-12-25 NOTE — Progress Notes (Signed)
Patient has rested well since admission to the floor.  He has no complaints of pain and has been able to urinate.  Vitals have been stable.

## 2022-12-25 NOTE — Assessment & Plan Note (Signed)
-   Patient was febrile to 102 with a leukocytosis of 12.8 - To medic with dysuria, urinary incontinence, fever, chills, rigors - UA indicative of UTI - Urine culture pending - Blood culture pending - Continue Rocephin - Patient recently had Foley catheter removed approximately 4 days ago - Continue to monitor

## 2022-12-25 NOTE — Evaluation (Signed)
Physical Therapy Evaluation Patient Details Name: Micheal Baker MRN: [id number] DOB: Dec 16, 1945 Today's Date: 12/25/2022  History of Present Illness  Micheal Baker is a 77 y.o. male with medical history significant of depression, anxiety, GERD, hypertension, hypothyroidism, urinary retention requiring Foley catheter that was just removed April 10, and more presents the ED with a chief complaint of dysuria.  Patient reports he had dysuria, subjective fever, chills, rigors at home.  He thought he had some hematuria a few days ago, but has not noticed any recently.  His urine is dark at bedside.  He reports generalized weakness, malaise, fatigue.  He had a normal appetite.  He reports that he had a UTI in the past and it was last year.  At that time he was hospitalized and he thinks he has been significantly deconditioned since then.  He reports the symptoms are the same as the symptoms he had then.  On review of systems he complains of headache.  He has seen ophthalmology for it and he think it is because of dry eyes.  He is using eyedrops.  He reports Tylenol has helped with the headache at home.  The headache is present now so we requested his nurse to dispense Tylenol.  Patient really does not have any other specific complaints at this time.     Patient does complain that he has not been the same since he had urosepsis a year ago.  He reports at that time he was working a part-time job, and he has been so weak, fatigued, and never the same again, so he has not even been able to continue his part-time job.  He thinks that several health problems have happened due to having been deconditioned after his hospitalization for sepsis.  He is interested in rehab, hoping for to be at home.   Clinical Impression  Patient functioning near baseline for functional mobility and gait with good return for bed mobility, transfers and ambulating in room/hallway without loss of balance, limited mostly due to fatigue and frontal  headache.  Patient tolerated sitting up in chair after therapy and encouraged to ambulate in room ad lib and with nursing staff in hallways.  Patient will benefit from continued skilled physical therapy in hospital and recommended venue below to increase strength, balance, endurance for safe ADLs and gait.          Recommendations for follow up therapy are one component of a multi-disciplinary discharge planning process, led by the attending physician.  Recommendations may be updated based on patient status, additional functional criteria and insurance authorization.  Follow Up Recommendations       Assistance Recommended at Discharge PRN  Patient can return home with the following  Help with stairs or ramp for entrance;Assistance with cooking/housework;A little help with walking and/or transfers;A little help with bathing/dressing/bathroom    Equipment Recommendations None recommended by PT  Recommendations for Other Services       Functional Status Assessment Patient has had a recent decline in their functional status and demonstrates the ability to make significant improvements in function in a reasonable and predictable amount of time.     Precautions / Restrictions Precautions Precautions: None Restrictions Weight Bearing Restrictions: No      Mobility  Bed Mobility Overal bed mobility: Modified Independent                  Transfers Overall transfer level: Modified independent  Ambulation/Gait Ambulation/Gait assistance: Supervision Gait Distance (Feet): 100 Feet Assistive device: None Gait Pattern/deviations: Decreased step length - left, Decreased stance time - right, Decreased stride length Gait velocity: slightly decreased     General Gait Details: slightly labored cadence without loss of balance or need for an AD  Stairs            Wheelchair Mobility    Modified Rankin (Stroke Patients Only)       Balance  Overall balance assessment: Needs assistance Sitting-balance support: Feet supported, No upper extremity supported Sitting balance-Leahy Scale: Good Sitting balance - Comments: seated at EOB   Standing balance support: During functional activity, No upper extremity supported Standing balance-Leahy Scale: Fair Standing balance comment: fair/good without AD                             Pertinent Vitals/Pain Pain Assessment Pain Assessment: 0-10 Pain Score: 4  Pain Location: frontal headache Pain Descriptors / Indicators: Headache Pain Intervention(s): Limited activity within patient's tolerance, Monitored during session    Home Living Family/patient expects to be discharged to:: Private residence Living Arrangements: Alone Available Help at Discharge: Family;Available PRN/intermittently Type of Home: House Home Access: Stairs to enter Entrance Stairs-Rails: Right;Left;Can reach both Entrance Stairs-Number of Steps: 2   Home Layout: One level Home Equipment: Cane - single point      Prior Function Prior Level of Function : Independent/Modified Independent;Driving             Mobility Comments: Community ambulator without AD, drives, shops ADLs Comments: Independent     Hand Dominance        Extremity/Trunk Assessment   Upper Extremity Assessment Upper Extremity Assessment: Overall WFL for tasks assessed    Lower Extremity Assessment Lower Extremity Assessment: Generalized weakness    Cervical / Trunk Assessment Cervical / Trunk Assessment: Normal  Communication   Communication: No difficulties  Cognition Arousal/Alertness: Awake/alert Behavior During Therapy: WFL for tasks assessed/performed Overall Cognitive Status: Within Functional Limits for tasks assessed                                          General Comments      Exercises     Assessment/Plan    PT Assessment Patient needs continued PT services  PT Problem  List Decreased strength;Decreased activity tolerance;Decreased balance;Decreased mobility       PT Treatment Interventions DME instruction;Gait training;Stair training;Functional mobility training;Therapeutic activities;Therapeutic exercise;Patient/family education;Balance training    PT Goals (Current goals can be found in the Care Plan section)  Acute Rehab PT Goals Patient Stated Goal: return home with family to assist PT Goal Formulation: With patient Time For Goal Achievement: 12/29/22 Potential to Achieve Goals: Good    Frequency Min 2X/week     Co-evaluation               AM-PAC PT "6 Clicks" Mobility  Outcome Measure Help needed turning from your back to your side while in a flat bed without using bedrails?: None Help needed moving from lying on your back to sitting on the side of a flat bed without using bedrails?: None Help needed moving to and from a bed to a chair (including a wheelchair)?: None Help needed standing up from a chair using your arms (e.g., wheelchair or bedside chair)?: None Help needed to walk in hospital  room?: A Little Help needed climbing 3-5 steps with a railing? : A Little 6 Click Score: 22    End of Session   Activity Tolerance: Patient tolerated treatment well;Patient limited by fatigue Patient left: in chair;with call bell/phone within reach Nurse Communication: Mobility status PT Visit Diagnosis: Unsteadiness on feet (R26.81);Other abnormalities of gait and mobility (R26.89);Other symptoms and signs involving the nervous system (R29.898)    Time: 4917-9150 PT Time Calculation (min) (ACUTE ONLY): 14 min   Charges:   PT Evaluation $PT Eval Low Complexity: 1 Low PT Treatments $Therapeutic Activity: 8-22 mins        12:01 PM, 12/25/22 Ocie Bob, MPT Physical Therapist with [facility] 336 [phone] office [phone] mobile phone

## 2022-12-25 NOTE — Assessment & Plan Note (Signed)
Continue Synthroid °

## 2022-12-25 NOTE — Assessment & Plan Note (Signed)
Continue PPI ?

## 2022-12-25 NOTE — Progress Notes (Signed)
   12/25/22 1755  Assess: MEWS Score  Temp (!) 102 F (38.9 C)  Level of Consciousness Alert  Assess: MEWS Score  MEWS Temp 2  MEWS Systolic 0  MEWS Pulse 0  MEWS RR 0  MEWS LOC 0  MEWS Score 2  MEWS Score Color Yellow  Assess: if the MEWS score is Yellow or Red  Were vital signs taken at a resting state? Yes  Focused Assessment No change from prior assessment  Does the patient meet 2 or more of the SIRS criteria? No  Does the patient have a confirmed or suspected source of infection? Yes  Provider and Rapid Response Notified? No  MEWS guidelines implemented  Yes, yellow  Treat  MEWS Interventions Considered administering scheduled or prn medications/treatments as ordered  Take Vital Signs  Increase Vital Sign Frequency  Yellow: Q2hr x1, continue Q4hrs until patient remains green for 12hrs  Escalate  MEWS: Escalate Yellow: Discuss with charge nurse and consider notifying provider and/or RRT  Notify: Charge Nurse/RN  Name of Charge Nurse/RN Notified Tonna Boehringer, RN  Provider Notification  Provider Name/Title Standley Dakins, MD  Date Provider Notified 12/25/22  Time Provider Notified 1755  Method of Notification  (secure chat)  Notification Reason  (elevated temp)  Provider response No new orders  Date of Provider Response 12/25/22  Time of Provider Response 1755  Assess: SIRS CRITERIA  SIRS Temperature  1  SIRS Pulse 0  SIRS Respirations  0  SIRS WBC 0  SIRS Score Sum  1

## 2022-12-25 NOTE — TOC Initial Note (Signed)
Transition of Care [facility]) - Initial/Assessment Note    Patient Details  Name: Micheal Baker MRN: [id number] Date of Birth: Nov 07, 1945  [facility]) CM/SW Contact:    Catalina Gravel, LCSW Phone Number: 12/25/2022, 12:44 PM  Clinical Narrative:                 CSW spoke with pt who is in agreement with HHPT.  Contacted Cory at [facility] who accepted pt. CSW secure chat to Dr. Informing pt agreeable and to add orders at dc. DC expected Monday.    Barriers to Discharge: Continued Medical Work up   Patient Goals and CMS Choice            Expected Discharge Plan and Services                                              Prior Living Arrangements/Services                       Activities of Daily Living Home Assistive Devices/Equipment: None ADL Screening (condition at time of admission) Patient's cognitive ability adequate to safely complete daily activities?: Yes Is the patient deaf or have difficulty hearing?: No Does the patient have difficulty seeing, even when wearing glasses/contacts?: No Does the patient have difficulty concentrating, remembering, or making decisions?: No Patient able to express need for assistance with ADLs?: Yes Does the patient have difficulty dressing or bathing?: No Independently performs ADLs?: Yes (appropriate for developmental age) Does the patient have difficulty walking or climbing stairs?: No Weakness of Legs: None Weakness of Arms/Hands: None  Permission Sought/Granted                  Emotional Assessment              Admission diagnosis:  Acute cystitis without hematuria [N30.00] Sepsis due to urinary tract infection [A41.9, N39.0] Sepsis, due to unspecified organism, unspecified whether acute organ dysfunction present [A41.9] Patient Active Problem List   Diagnosis Date Noted   GERD (gastroesophageal reflux disease) 12/25/2022   Anxiety and depression 12/25/2022   Sepsis due to urinary  tract infection 12/24/2022   Constipation 10/27/2022   Change in stool caliber 10/27/2022   Pain of upper abdomen 10/27/2022   Left leg pain 09/21/2022   Urinary tract infection associated with indwelling urethral catheter    Severe sepsis 01/30/2022   BPH (benign prostatic hyperplasia) 01/30/2022   Hypokalemia 01/30/2022   Acute lateral meniscus tear of left knee 06/27/2014   Acute medial meniscus tear of left knee    NSVT (nonsustained ventricular tachycardia)    Bradycardia 06/10/2014   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   Cough 10/03/2012   Chest pain 08/08/2011   Essential hypertension 08/08/2011   Hypothyroidism 08/08/2011   PCP:  [name], MD Pharmacy:   [facility] - [place], [place] - [phone] [place] [address] Phone: [phone] Fax: [phone]     Social Determinants of Health (SDOH) Social History: SDOH Screenings   Food Insecurity: No Food Insecurity (12/25/2022)  Housing: Low Risk  (12/25/2022)  Transportation Needs: No Transportation Needs (12/25/2022)  Utilities: Not At Risk (12/25/2022)  Tobacco Use: Low Risk  (12/24/2022)   SDOH Interventions:     Readmission Risk Interventions     No data to display

## 2022-12-26 DIAGNOSIS — K219 Gastro-esophageal reflux disease without esophagitis: Secondary | ICD-10-CM | POA: Diagnosis not present

## 2022-12-26 DIAGNOSIS — A419 Sepsis, unspecified organism: Secondary | ICD-10-CM | POA: Diagnosis not present

## 2022-12-26 DIAGNOSIS — E039 Hypothyroidism, unspecified: Secondary | ICD-10-CM | POA: Diagnosis not present

## 2022-12-26 DIAGNOSIS — N390 Urinary tract infection, site not specified: Secondary | ICD-10-CM | POA: Diagnosis not present

## 2022-12-26 LAB — BASIC METABOLIC PANEL
Anion gap: 7 (ref 5–15)
BUN: 10 mg/dL (ref 8–23)
CO2: 21 mmol/L — ABNORMAL LOW (ref 22–32)
Calcium: 8.4 mg/dL — ABNORMAL LOW (ref 8.9–10.3)
Chloride: 106 mmol/L (ref 98–111)
Creatinine, Ser: 0.92 mg/dL (ref 0.61–1.24)
GFR, Estimated: >60 mL/min (ref >60)
Glucose, Bld: 105 mg/dL — ABNORMAL HIGH (ref 70–99)
Potassium: 3.8 mmol/L (ref 3.5–5.1)
Sodium: 134 mmol/L — ABNORMAL LOW (ref 135–145)

## 2022-12-26 LAB — CBC WITH DIFFERENTIAL/PLATELET
Abs Immature Granulocytes: 0.07 10*3/uL (ref 0.00–0.07)
Basophils Absolute: 0 10*3/uL (ref 0.0–0.1)
Basophils Relative: 0 %
Eosinophils Absolute: 0 10*3/uL (ref 0.0–0.5)
Eosinophils Relative: 0 %
HCT: 44.8 % (ref 39.0–52.0)
Hemoglobin: 14.8 g/dL (ref 13.0–17.0)
Immature Granulocytes: 1 %
Lymphocytes Relative: 5 %
Lymphs Abs: 0.7 10*3/uL (ref 0.7–4.0)
MCH: 30 pg (ref 26.0–34.0)
MCHC: 33 g/dL (ref 30.0–36.0)
MCV: 90.9 fL (ref 80.0–100.0)
Monocytes Absolute: 1 10*3/uL (ref 0.1–1.0)
Monocytes Relative: 7 %
Neutro Abs: 11.7 10*3/uL — ABNORMAL HIGH (ref 1.7–7.7)
Neutrophils Relative %: 87 %
Platelets: 181 10*3/uL (ref 150–400)
RBC: 4.93 MIL/uL (ref 4.22–5.81)
RDW: 13.2 % (ref 11.5–15.5)
WBC: 13.5 10*3/uL — ABNORMAL HIGH (ref 4.0–10.5)
nRBC: 0 % (ref 0.0–0.2)

## 2022-12-26 LAB — CULTURE, BLOOD (ROUTINE X 2)

## 2022-12-26 LAB — URINE CULTURE: Culture: <10000 — AB

## 2022-12-26 MED ORDER — CALCIUM CARBONATE ANTACID 500 MG PO CHEW
1.0000 | CHEWABLE_TABLET | Freq: Three times a day (TID) | ORAL | Status: DC
  Administered 2022-12-26 – 2022-12-28 (×7): 200 mg via ORAL
  Filled 2022-12-26 (×7): qty 1

## 2022-12-26 MED ORDER — ENSURE ENLIVE PO LIQD
237.0000 mL | Freq: Two times a day (BID) | ORAL | Status: DC
  Administered 2022-12-26 – 2022-12-28 (×4): 237 mL via ORAL

## 2022-12-26 MED ORDER — POLYETHYLENE GLYCOL 3350 17 G PO PACK
17.0000 g | PACK | Freq: Every day | ORAL | Status: DC
  Administered 2022-12-26: 17 g via ORAL
  Filled 2022-12-26 (×3): qty 1

## 2022-12-26 MED ORDER — CALCIUM GLUCONATE-NACL 1-0.675 GM/50ML-% IV SOLN
1.0000 g | Freq: Once | INTRAVENOUS | Status: AC
  Administered 2022-12-26: 1000 mg via INTRAVENOUS
  Filled 2022-12-26: qty 50

## 2022-12-26 NOTE — Assessment & Plan Note (Signed)
--  IV and oral replacement given

## 2022-12-26 NOTE — Progress Notes (Addendum)
PROGRESS NOTE   Micheal Baker  ZOX:[id number] DOB: May 17, 1946 DOA: 12/24/2022 PCP: [name], MD   Chief Complaint  Patient presents with   Urinary Retention   Level of care: Telemetry  Brief Admission History:   77 y.o. male with medical history significant of depression, anxiety, GERD, hypertension, hypothyroidism, urinary retention requiring Foley catheter that was just removed April 10, and more presents the ED with a chief complaint of dysuria.  Patient reports he had dysuria, subjective fever, chills, rigors at home.  He thought he had some hematuria a few days ago, but has not noticed any recently.  His urine is dark at bedside.  He reports generalized weakness, malaise, fatigue.  He had a normal appetite.  He reports that he had a UTI in the past and it was last year.  At that time he was hospitalized and he thinks he has been significantly deconditioned since then.  He reports the symptoms are the same as the symptoms he had then.  On review of systems he complains of headache.  He has seen ophthalmology for it and he think it is because of dry eyes.  He is using eyedrops.  He reports Tylenol has helped with the headache at home.  The headache is present now so we requested his nurse to dispense Tylenol.  Patient really does not have any other specific complaints at this time.   Patient does complain that he has not been the same since he had urosepsis a year ago.  He reports at that time he was working a part-time job, and he has been so weak, fatigued, and never the same again, so he has not even been able to continue his part-time job.  He thinks that several health problems have happened due to having been deconditioned after his hospitalization for sepsis.  He is interested in rehab, hoping for to be at home.   Patient does not smoke.  He drinks about once per month.  He does not use illicit drugs.  He is vaccinated for COVID.  Patient is full code.   Assessment and Plan: * Sepsis  due to urinary tract infection - Patient continues to have fever and leukocytosis - Presented with dysuria, urinary incontinence, fever, chills, rigors - UA indicative of UTI - Urine culture pending - Blood culture pending - Continue Rocephin 2 gm IV every 24 hours - Patient recently had Foley catheter removed approximately 4 days ago at Dr. Dimas Millin office  - Continue to monitor  Hypocalcemia --IV and oral replacement given   Anxiety and depression - Patient required a dose of Ativan in the ER - Continue Lexapro - Continue to monitor  GERD (gastroesophageal reflux disease) - Continue PPI  Hypothyroidism - Continue Synthroid  DVT prophylaxis: [place] heparin Code Status: Full  Family Communication:  Disposition: Status is: Inpatient Remains inpatient appropriate because: IV antibiotics    Consultants:   Procedures:   Antimicrobials:  Ceftriaxone 4/13>>   Subjective: Pt says he feels better today than he felt yesterday. He wants to ambulate and wants to speak with dietitian.   Objective: Vitals:   12/25/22 1755 12/25/22 2024 12/26/22 0458 12/26/22 0805  BP:  116/71 132/81 115/73  Pulse:  85 88 83  Resp:  Temp: (!) 102 F (38.9 C) 98.3 F (36.8 C) 99.9 F (37.7 C) 98.2 F (36.8 C)  TempSrc: Oral   Oral  SpO2:  99% 100% 96%  Weight:      Height:  Intake/Output Summary (Last 24 hours) at 12/26/2022 1055 Last data filed at 12/26/2022 0900 Gross per 24 hour  Intake 480 ml  Output 300 ml  Net 180 ml   Filed Weights   12/24/22 2045  Weight: 95.3 kg   Examination:  General exam: Appears calm and comfortable  Respiratory system: Clear to auscultation. Respiratory effort normal. Cardiovascular system: normal S1 & S2 heard. No JVD, murmurs, rubs, gallops or clicks. No pedal edema. Gastrointestinal system: Abdomen is nondistended, soft and nontender. No organomegaly or masses felt. Normal bowel sounds heard. Central nervous system: Alert and  oriented. No focal neurological deficits. Extremities: Symmetric 5 x 5 power. Skin: No rashes, lesions or ulcers. Psychiatry: Judgement and insight appear normal. Mood & affect appropriate.   Data Reviewed: I have personally reviewed following labs and imaging studies  CBC: Recent Labs  Lab 12/24/22 2135 12/25/22 0334 12/26/22 0456  WBC 12.8* 13.5* 13.5*  NEUTROABS  --  11.5* 11.7*  HGB 14.4 14.0 14.8  HCT 41.9 40.7 44.8  MCV 86.9 87.9 90.9  PLT 231 226 181    Basic Metabolic Panel: Recent Labs  Lab 12/24/22 2135 12/25/22 0334 12/26/22 0456  NA 134* 138 134*  K 4.4 3.1* 3.8  CL 101 104 106  CO2 26 24 21*  GLUCOSE 111* 103* 105*  BUN 17 14 10   CREATININE 0.96 0.93 0.92  CALCIUM 9.1 8.7* 8.4*  MG  --  2.0  --     CBG: No results for input(s): "GLUCAP" in the last 168 hours.  Recent Results (from the past 240 hour(s))  Blood Culture (routine x 2)     Status: None (Preliminary result)   Collection Time: 12/24/22 10:57 PM   Specimen: Right Antecubital; Blood  Result Value Ref Range Status   Specimen Description   Final    RIGHT ANTECUBITAL BOTTLES DRAWN AEROBIC AND ANAEROBIC   Special Requests Blood Culture adequate volume  Final   Culture   Final    NO GROWTH 2 DAYS Performed at [facility], [address], [place], [place] [address]    Report Status PENDING  Incomplete  Blood Culture (routine x 2)     Status: None (Preliminary result)   Collection Time: 12/24/22 10:59 PM   Specimen: Left Antecubital; Blood  Result Value Ref Range Status   Specimen Description   Final    LEFT ANTECUBITAL BOTTLES DRAWN AEROBIC AND ANAEROBIC   Special Requests Blood Culture adequate volume  Final   Culture   Final    NO GROWTH 2 DAYS Performed at [facility], [address] [place], [place] [address]    Report Status PENDING  Incomplete  Culture, blood (Routine X 2) w Reflex to ID Panel     Status: None (Preliminary result)   Collection Time: 12/26/22  4:56 AM    Specimen: Site Not Specified; Blood  Result Value Ref Range Status   Specimen Description   Final    SITE NOT SPECIFIED BOTTLES DRAWN AEROBIC AND ANAEROBIC   Special Requests   Final    Blood Culture adequate volume Performed at [facility], [address], [place], [place] [address]    Culture PENDING  Incomplete   Report Status PENDING  Incomplete  Culture, blood (Routine X 2) w Reflex to ID Panel     Status: None (Preliminary result)   Collection Time: 12/26/22  4:56 AM   Specimen: Site Not Specified; Blood  Result Value Ref Range Status   Specimen Description  Final    SITE NOT SPECIFIED BOTTLES DRAWN AEROBIC AND ANAEROBIC   Special Requests   Final    Blood Culture adequate volume Performed at [facility], [address] [place], [place] [address]    Culture PENDING  Incomplete   Report Status PENDING  Incomplete     Radiology Studies: No results found.  Scheduled Meds:  calcium carbonate  1 tablet Oral TID WC   escitalopram  20 mg Oral Daily   finasteride  5 mg Oral Daily   heparin  5,000 Units Subcutaneous Q8H   levothyroxine  75 mcg Oral QAC breakfast   pantoprazole  40 mg Oral q1600   tamsulosin  0.4 mg Oral QPC breakfast   Continuous Infusions:  sodium chloride 75 mL/hr at 12/25/22 1520   calcium gluconate     cefTRIAXone (ROCEPHIN)  IV 2 g (12/25/22 2219)     LOS: 2 days   Time spent: 38 mins  Valentin Benney Laural Benes, MD How to contact the [facility] Attending or Consulting provider 7A - 7P or covering provider during after hours 7P -7A, for this patient?  Check the care team in [facility] and look for a) attending/consulting TRH provider listed and b) the [facility] team listed Log into [email or web address] and use [place]'s universal password to access. If you do not have the password, please contact the hospital operator. Locate the [facility] provider you are looking for under [facility] and page to a number that you can be directly reached. If you still have difficulty reaching  the provider, please page the [facility] (Director on Call) for the Hospitalists listed on amion for assistance.  12/26/2022, 10:55 AM

## 2022-12-26 NOTE — Progress Notes (Signed)
Mobility Specialist Progress Note:    12/26/22 1016  Mobility  Activity Ambulated with assistance in hallway  Level of Assistance Modified independent, requires aide device or extra time  Assistive Device  (IV pole, hand rails)  Distance Ambulated (ft) 200 ft  Activity Response Tolerated well  Mobility Referral Yes   Pt received in bed, agreeable to mobility session. Tolerated well, asx throughout. Pt used hand rails and IV pole for assistance ambulating in hallway. Returned pt to chair, all needs met, call bell in reach.   Feliciana Rossetti Mobility Specialist Please contact via Special educational needs teacher or  Rehab office at [phone]

## 2022-12-26 NOTE — Progress Notes (Signed)
Initial Nutrition Assessment  DOCUMENTATION CODES:   Not applicable  INTERVENTION:  Liberalize diet to regular  Ensure Enlive po BID, each supplement provides 350 kcal and 20 grams of protein. "High Calorie, High Protein Nutrition Therapy" handout discussed and added to AVS  NUTRITION DIAGNOSIS:   Increased nutrient needs related to acute illness as evidenced by estimated needs.  GOAL:   Patient will meet greater than or equal to 90% of their needs  MONITOR:   PO intake, Supplement acceptance, Labs, Weight trends  REASON FOR ASSESSMENT:   Consult Assessment of nutrition requirement/status  ASSESSMENT:   Pt admitted with sepsis d/t UTI. PMH significant for depression, anxiety, GERD, HTN, hypothyroidism, urinary retention requiring foley cath that was removed 4/10.  Pt sitting up eating lunch at time of visit. He ate about 90% of his meal. He endorses having a fair appetite. He states that about this time last year, he was admitted for the same condition. He states that within the last 2 months he was just starting to feel back to his baseline until he began having new issues r/t this UTI.   He had been working with PT and endorses being very physically active- cycles 3 times per week and strength trains.   He lives alone and states that his family doesn't live very close but are willing to prepare bulk meals to bring for him to have when he does not feel like preparing meals. He recalls having oatmeal with almond milk and almond butter for breakfast. He will mix Premier Protein or Muscle Milk powder in with his oatmeal. Throughout the day he will have greek yogurt with nuts, protein powder and a granola with nuts and seeds. Dinner may consist of a salad with rotisserie chicken and a hard boiled egg or a sandwich. If he doesn't feel up to cooking, he may have an oatmeal cookie.   Briefly, discussed ways to optimize his PO intake with minimal effort. Provided some options for  pre-made meals that his family could prepare and he could freeze.   Meal completions: 4/14: 80% breakfast, 60% dinner 4/15: 90% breakfast  To pt's knowledge, he reports his weight has remained stable.  Reviewed weight history. Uncertain whether admit weight is actual versus stated however it is noted to be consistent with prior documented weight since 03/20. No significant weight changes per documented history. Will continue to monitor throughout admission.   Medications: TUMS TID, protonix, IV abx  Labs: sodium 134, Ca 8.4  NUTRITION - FOCUSED PHYSICAL EXAM:  Flowsheet Row Most Recent Value  Orbital Region Mild depletion  Upper Arm Region Moderate depletion  Thoracic and Lumbar Region No depletion  Buccal Region Mild depletion  Temple Region No depletion  Clavicle Bone Region Mild depletion  Clavicle and Acromion Bone Region No depletion  Scapular Bone Region No depletion  Dorsal Hand No depletion  Patellar Region Mild depletion  Anterior Thigh Region Mild depletion  Posterior Calf Region No depletion  Edema (RD Assessment) None  Hair Reviewed  Eyes Reviewed  Mouth Reviewed  Skin Reviewed  Nails Reviewed       Diet Order:   Diet Order             Diet Heart Room service appropriate? Yes; Fluid consistency: Thin  Diet effective now                   EDUCATION NEEDS:   Education needs have been addressed  Skin:  Skin Assessment: Reviewed RN Assessment  Last BM:  4/13  Height:   Ht Readings from Last 1 Encounters:  12/24/22 6\' 5"  (1.956 m)    Weight:   Wt Readings from Last 1 Encounters:  12/24/22 95.3 kg   BMI:  Body mass index is 24.9 kg/m.  Estimated Nutritional Needs:   Kcal:  2400-2600  Protein:  125-140g  Fluid:  >/=2L  Drusilla Kanner, RDN, LDN Clinical Nutrition

## 2022-12-26 NOTE — Progress Notes (Signed)
Physical Therapy Treatment Patient Details Name: Micheal Baker MRN: [id number] DOB: 25-Apr-1946 Today's Date: 12/26/2022   History of Present Illness Micheal Baker is a 77 y.o. male with medical history significant of depression, anxiety, GERD, hypertension, hypothyroidism, urinary retention requiring Foley catheter that was just removed April 10, and more presents the ED with a chief complaint of dysuria.  Patient reports he had dysuria, subjective fever, chills, rigors at home.  He thought he had some hematuria a few days ago, but has not noticed any recently.  His urine is dark at bedside.  He reports generalized weakness, malaise, fatigue.  He had a normal appetite.  He reports that he had a UTI in the past and it was last year.  At that time he was hospitalized and he thinks he has been significantly deconditioned since then.  He reports the symptoms are the same as the symptoms he had then.  On review of systems he complains of headache.  He has seen ophthalmology for it and he think it is because of dry eyes.  He is using eyedrops.  He reports Tylenol has helped with the headache at home.  The headache is present now so we requested his nurse to dispense Tylenol.  Patient really does not have any other specific complaints at this time.     Patient does complain that he has not been the same since he had urosepsis a year ago.  He reports at that time he was working a part-time job, and he has been so weak, fatigued, and never the same again, so he has not even been able to continue his part-time job.  He thinks that several health problems have happened due to having been deconditioned after his hospitalization for sepsis.  He is interested in rehab, hoping for to be at home.    PT Comments    Patient does not require assist for bed mobility or transfer to standing. He ambulates with IV pole without loss of balance in room/halls. Patient ambulates with bilateral knees flexed in stance likely due to c/o  knee issues. Educated patient on follow up with outpatient PT for knees after d/c from hospital and HHPT. Paitent ends session seated in chair at bedside. Patient will benefit from continued skilled physical therapy in hospital and recommended venue below to increase strength, balance, endurance for safe ADLs and gait.    Recommendations for follow up therapy are one component of a multi-disciplinary discharge planning process, led by the attending physician.  Recommendations may be updated based on patient status, additional functional criteria and insurance authorization.  Follow Up Recommendations       Assistance Recommended at Discharge PRN  Patient can return home with the following Help with stairs or ramp for entrance;Assistance with cooking/housework;A little help with walking and/or transfers;A little help with bathing/dressing/bathroom   Equipment Recommendations  None recommended by PT    Recommendations for Other Services       Precautions / Restrictions Precautions Precautions: None Restrictions Weight Bearing Restrictions: No     Mobility  Bed Mobility Overal bed mobility: Modified Independent                  Transfers Overall transfer level: Modified independent                      Ambulation/Gait Ambulation/Gait assistance: Supervision Gait Distance (Feet): 200 Feet Assistive device: IV Pole Gait Pattern/deviations: Decreased step length - left, Decreased stance time -  right, Decreased stride length, Knee flexed in stance - right, Knee flexed in stance - left       General Gait Details: slightly labored cadence without loss of balance, bilateral knees flexed in stance   Stairs             Wheelchair Mobility    Modified Rankin (Stroke Patients Only)       Balance Overall balance assessment: Needs assistance Sitting-balance support: Feet supported, No upper extremity supported Sitting balance-Leahy Scale: Good Sitting  balance - Comments: seated at EOB   Standing balance support: During functional activity, No upper extremity supported Standing balance-Leahy Scale: Fair Standing balance comment: fair/good without AD                            Cognition Arousal/Alertness: Awake/alert Behavior During Therapy: WFL for tasks assessed/performed Overall Cognitive Status: Within Functional Limits for tasks assessed                                          Exercises      General Comments        Pertinent Vitals/Pain Pain Assessment Pain Assessment: No/denies pain    Home Living                          Prior Function            PT Goals (current goals can now be found in the care plan section) Acute Rehab PT Goals Patient Stated Goal: return home with family to assist PT Goal Formulation: With patient Time For Goal Achievement: 12/29/22 Potential to Achieve Goals: Good Progress towards PT goals: Progressing toward goals    Frequency    Min 2X/week      PT Plan Current plan remains appropriate    Co-evaluation              AM-PAC PT "6 Clicks" Mobility   Outcome Measure  Help needed turning from your back to your side while in a flat bed without using bedrails?: None Help needed moving from lying on your back to sitting on the side of a flat bed without using bedrails?: None Help needed moving to and from a bed to a chair (including a wheelchair)?: None Help needed standing up from a chair using your arms (e.g., wheelchair or bedside chair)?: None Help needed to walk in hospital room?: A Little Help needed climbing 3-5 steps with a railing? : A Little 6 Click Score: 22    End of Session   Activity Tolerance: Patient tolerated treatment well Patient left: in chair;with call bell/phone within reach Nurse Communication: Mobility status PT Visit Diagnosis: Unsteadiness on feet (R26.81);Other abnormalities of gait and mobility  (R26.89);Other symptoms and signs involving the nervous system (R29.898)     Time: 1436-1450 PT Time Calculation (min) (ACUTE ONLY): 14 min  Charges:  $Therapeutic Activity: 8-22 mins                     2:57 PM, 12/26/22 Wyman Songster PT, DPT Physical Therapist at [facility]

## 2022-12-26 NOTE — Hospital Course (Signed)
77 y.o. male with medical history significant of depression, anxiety, GERD, hypertension, hypothyroidism, urinary retention requiring Foley catheter that was just removed April 10, and more presents the ED with a chief complaint of dysuria.  Patient reports he had dysuria, subjective fever, chills, rigors at home.  He thought he had some hematuria a few days ago, but has not noticed any recently.  His urine is dark at bedside.  He reports generalized weakness, malaise, fatigue.  He had a normal appetite.  He reports that he had a UTI in the past and it was last year.  At that time he was hospitalized and he thinks he has been significantly deconditioned since then.  He reports the symptoms are the same as the symptoms he had then.  On review of systems he complains of headache.  He has seen ophthalmology for it and he think it is because of dry eyes.  He is using eyedrops.  He reports Tylenol has helped with the headache at home.  The headache is present now so we requested his nurse to dispense Tylenol.  Patient really does not have any other specific complaints at this time.   Patient does complain that he has not been the same since he had urosepsis a year ago.  He reports at that time he was working a part-time job, and he has been so weak, fatigued, and never the same again, so he has not even been able to continue his part-time job.  He thinks that several health problems have happened due to having been deconditioned after his hospitalization for sepsis.  He is interested in rehab, hoping for to be at home.   Patient does not smoke.  He drinks about once per month.  He does not use illicit drugs.  He is vaccinated for COVID.  Patient is full code.

## 2022-12-27 ENCOUNTER — Telehealth: Payer: Self-pay

## 2022-12-27 DIAGNOSIS — K219 Gastro-esophageal reflux disease without esophagitis: Secondary | ICD-10-CM | POA: Diagnosis not present

## 2022-12-27 DIAGNOSIS — E039 Hypothyroidism, unspecified: Secondary | ICD-10-CM | POA: Diagnosis not present

## 2022-12-27 DIAGNOSIS — N390 Urinary tract infection, site not specified: Secondary | ICD-10-CM | POA: Diagnosis not present

## 2022-12-27 DIAGNOSIS — A419 Sepsis, unspecified organism: Secondary | ICD-10-CM | POA: Diagnosis not present

## 2022-12-27 LAB — CBC WITH DIFFERENTIAL/PLATELET
Abs Immature Granulocytes: 0.06 K/uL (ref 0.00–0.07)
Basophils Absolute: 0 K/uL (ref 0.0–0.1)
Basophils Relative: 0 %
Eosinophils Absolute: 0 K/uL (ref 0.0–0.5)
Eosinophils Relative: 0 %
HCT: 36.3 % — ABNORMAL LOW (ref 39.0–52.0)
Hemoglobin: 12.4 g/dL — ABNORMAL LOW (ref 13.0–17.0)
Immature Granulocytes: 1 %
Lymphocytes Relative: 5 %
Lymphs Abs: 0.5 K/uL — ABNORMAL LOW (ref 0.7–4.0)
MCH: 30.2 pg (ref 26.0–34.0)
MCHC: 34.2 g/dL (ref 30.0–36.0)
MCV: 88.3 fL (ref 80.0–100.0)
Monocytes Absolute: 0.7 K/uL (ref 0.1–1.0)
Monocytes Relative: 7 %
Neutro Abs: 8.8 K/uL — ABNORMAL HIGH (ref 1.7–7.7)
Neutrophils Relative %: 87 %
Platelets: 215 K/uL (ref 150–400)
RBC: 4.11 MIL/uL — ABNORMAL LOW (ref 4.22–5.81)
RDW: 12.8 % (ref 11.5–15.5)
WBC: 10.1 K/uL (ref 4.0–10.5)
nRBC: 0 % (ref 0.0–0.2)

## 2022-12-27 LAB — BASIC METABOLIC PANEL WITH GFR
Anion gap: 5 (ref 5–15)
BUN: 13 mg/dL (ref 8–23)
CO2: 22 mmol/L (ref 22–32)
Calcium: 8.3 mg/dL — ABNORMAL LOW (ref 8.9–10.3)
Chloride: 106 mmol/L (ref 98–111)
Creatinine, Ser: 0.89 mg/dL (ref 0.61–1.24)
GFR, Estimated: >60 mL/min
Glucose, Bld: 113 mg/dL — ABNORMAL HIGH (ref 70–99)
Potassium: 3.4 mmol/L — ABNORMAL LOW (ref 3.5–5.1)
Sodium: 133 mmol/L — ABNORMAL LOW (ref 135–145)

## 2022-12-27 LAB — URINE CULTURE

## 2022-12-27 MED ORDER — CHLORHEXIDINE GLUCONATE CLOTH 2 % EX PADS
6.0000 | MEDICATED_PAD | Freq: Every day | CUTANEOUS | Status: DC
  Administered 2022-12-27 – 2022-12-28 (×2): 6 via TOPICAL

## 2022-12-27 NOTE — Progress Notes (Signed)
Patient called out to nurses station c/o pain and urgency to void. Nurse bladder scanned patient and scan showed >450 mL. Nurse notified Dr. Dorthula Perfect and new orders to place uretheral catheter. 16 Fr foley catheter inserted; clear, yellow urine return noted with some red sediment. Patient tolerated insertion well and reports relief.

## 2022-12-27 NOTE — Progress Notes (Signed)
PROGRESS NOTE   Micheal Baker  XWR:[id number] DOB: 1945/10/24 DOA: 12/24/2022 PCP: [name], MD   Chief Complaint  Patient presents with   Urinary Retention   Level of care: Telemetry  Brief Admission History:   77 y.o. male with medical history significant of depression, anxiety, GERD, hypertension, hypothyroidism, urinary retention requiring Foley catheter that was just removed April 10, and more presents the ED with a chief complaint of dysuria.  Patient reports he had dysuria, subjective fever, chills, rigors at home.  He thought he had some hematuria a few days ago, but has not noticed any recently.  His urine is dark at bedside.  He reports generalized weakness, malaise, fatigue.  He had a normal appetite.  He reports that he had a UTI in the past and it was last year.  At that time he was hospitalized and he thinks he has been significantly deconditioned since then.  He reports the symptoms are the same as the symptoms he had then.  On review of systems he complains of headache.  He has seen ophthalmology for it and he think it is because of dry eyes.  He is using eyedrops.  He reports Tylenol has helped with the headache at home.  The headache is present now so we requested his nurse to dispense Tylenol.  Patient really does not have any other specific complaints at this time.   Patient does complain that he has not been the same since he had urosepsis a year ago.  He reports at that time he was working a part-time job, and he has been so weak, fatigued, and never the same again, so he has not even been able to continue his part-time job.  He thinks that several health problems have happened due to having been deconditioned after his hospitalization for sepsis.  He is interested in rehab, hoping for to be at home.   Patient does not smoke.  He drinks about once per month.  He does not use illicit drugs.  He is vaccinated for COVID.  Patient is full code.   Assessment and Plan: Sepsis  due to urinary tract infection - Patient continues to have fever and leukocytosis - Presented with dysuria, urinary incontinence, fever, chills, rigors - UA indicative of UTI - Urine culture pending - Blood culture pending - Continue Rocephin 2 gm IV every 24 hours - Patient recently had Foley catheter removed approximately 4 days ago at Dr. Dimas Millin office  - Continue to monitor  Hypocalcemia --IV calcium and oral tums supplement replacement given   Acute Urinary Retention  -- unfortunately developed severe acute urinary retention overnight -- foley was inserted without difficulty with immediate relief in symptoms  -- plan to leave foley in place with close follow up with Dr Ronne Binning  -- he has been maintained on finasteride and tamsulosin without interruption  Anxiety and depression - Patient required a dose of Ativan in the ER - Continue Lexapro - Continue to monitor  GERD (gastroesophageal reflux disease) - Continue PPI  Hypothyroidism - Continue Synthroid  DVT prophylaxis: Grover Toppins heparin Code Status: Full  Family Communication:  Disposition: Status is: Inpatient Remains inpatient appropriate because: IV antibiotics    Consultants:   Procedures:   Antimicrobials:  Ceftriaxone 4/13>>   Subjective: Pt upset he had to wait a good while last night when he developed acute urinary retention for staff to locate the bladder scanner.  They finally were able to bring one up from the ED.  He  had >450 cc and foley was inserted with relief in symptoms.  No further chills.  Not ready to go home, still upset about overnight experience.    Objective: Vitals:   12/26/22 0805 12/26/22 2123 12/26/22 2124 12/27/22 0409  BP: 115/73 (!) 162/78 (!) 161/85 (!) 149/81  Pulse: 83 90  87  Resp: Temp: 98.2 F (36.8 C) 99.2 F (37.3 C) 99.2 F (37.3 C) 99.1 F (37.3 C)  TempSrc: Oral Oral  Oral  SpO2: 96% 100%  96%  Weight:      Height:        Intake/Output Summary  (Last 24 hours) at 12/27/2022 1217 Last data filed at 12/27/2022 1000 Gross per 24 hour  Intake 2343.96 ml  Output 4400 ml  Net -2056.04 ml   Filed Weights   12/24/22 2045  Weight: 95.3 kg   Examination:  General exam: Appears calm and comfortable  Respiratory system: Clear to auscultation. Respiratory effort normal. Cardiovascular system: normal S1 & S2 heard. No JVD, murmurs, rubs, gallops or clicks. No pedal edema. Gastrointestinal system: Abdomen is nondistended, soft and nontender. No organomegaly or masses felt. Normal bowel sounds heard. Central nervous system: Alert and oriented. No focal neurological deficits. Extremities: Symmetric 5 x 5 power. Skin: No rashes, lesions or ulcers. Psychiatry: Judgement and insight appear normal. Mood & affect appropriate.   Data Reviewed: I have personally reviewed following labs and imaging studies  CBC: Recent Labs  Lab 12/24/22 2135 12/25/22 0334 12/26/22 0456 12/27/22 0414  WBC 12.8* 13.5* 13.5* 10.1  NEUTROABS  --  11.5* 11.7* 8.8*  HGB 14.4 14.0 14.8 12.4*  HCT 41.9 40.7 44.8 36.3*  MCV 86.9 87.9 90.9 88.3  PLT 231 226 181 215    Basic Metabolic Panel: Recent Labs  Lab 12/24/22 2135 12/25/22 0334 12/26/22 0456 12/27/22 0414  NA 134* 138 134* 133*  K 4.4 3.1* 3.8 3.4*  CL 101 104 106 106  CO2 26 24 21* 22  GLUCOSE 111* 103* 105* 113*  BUN CREATININE 0.96 0.93 0.92 0.89  CALCIUM 9.1 8.7* 8.4* 8.3*  MG  --  2.0  --   --     CBG: No results for input(s): "GLUCAP" in the last 168 hours.  Recent Results (from the past 240 hour(s))  Remove urinary catheter to obtain Clean Catch urine culture     Status: Abnormal   Collection Time: 12/24/22  9:11 PM   Specimen: Urine, Clean Catch  Result Value Ref Range Status   Specimen Description   Final    URINE, CLEAN CATCH Performed at [facility], [address], [place], [place] [address]    Special Requests   Final    NONE Performed at [facility], [address] [place], [place] [address]    Culture (A)  Final    <10,000 COLONIES/mL INSIGNIFICANT GROWTH Performed at [facility], [address], [place], [place] [address]    Report Status 12/26/2022 FINAL  Final  Urine Culture     Status: Abnormal (Preliminary result)   Collection Time: 12/24/22 10:11 PM   Specimen: Urine, Clean Catch  Result Value Ref Range Status   Specimen Description   Final    URINE, CLEAN CATCH Performed at [facility], [address] [place], [place] [address]    Special Requests   Final    NONE Performed at [facility], [address] [place], [place] [address]  Culture (A)  Final    20,000 COLONIES/mL GRAM NEGATIVE RODS SUSCEPTIBILITIES TO FOLLOW Performed at [facility], [address] [place], [place] [address]    Report Status PENDING  Incomplete  Blood Culture (routine x 2)     Status: None (Preliminary result)   Collection Time: 12/24/22 10:57 PM   Specimen: Right Antecubital; Blood  Result Value Ref Range Status   Specimen Description   Final    RIGHT ANTECUBITAL BOTTLES DRAWN AEROBIC AND ANAEROBIC   Special Requests Blood Culture adequate volume  Final   Culture   Final    NO GROWTH 2 DAYS Performed at [facility], [address] [place], [place] [address]    Report Status PENDING  Incomplete  Blood Culture (routine x 2)     Status: None (Preliminary result)   Collection Time: 12/24/22 10:59 PM   Specimen: Left Antecubital; Blood  Result Value Ref Range Status   Specimen Description   Final    LEFT ANTECUBITAL BOTTLES DRAWN AEROBIC AND ANAEROBIC   Special Requests Blood Culture adequate volume  Final   Culture   Final    NO GROWTH 2 DAYS Performed at [facility], [address] [place], [place] [address]    Report Status PENDING  Incomplete  Culture, blood (Routine X 2) w Reflex to ID Panel     Status: None (Preliminary result)   Collection Time: 12/26/22  4:56 AM   Specimen: Site Not Specified; Blood   Result Value Ref Range Status   Specimen Description   Final    SITE NOT SPECIFIED BOTTLES DRAWN AEROBIC AND ANAEROBIC   Special Requests Blood Culture adequate volume  Final   Culture   Final    NO GROWTH < 12 HOURS Performed at [facility], [address], [place], [place] [address]    Report Status PENDING  Incomplete  Culture, blood (Routine X 2) w Reflex to ID Panel     Status: None (Preliminary result)   Collection Time: 12/26/22  4:56 AM   Specimen: Site Not Specified; Blood  Result Value Ref Range Status   Specimen Description   Final    SITE NOT SPECIFIED BOTTLES DRAWN AEROBIC AND ANAEROBIC   Special Requests Blood Culture adequate volume  Final   Culture   Final    NO GROWTH < 12 HOURS Performed at [facility], [address], [place], [place] [address]    Report Status PENDING  Incomplete     Radiology Studies: No results found.  Scheduled Meds:  calcium carbonate  1 tablet Oral TID WC   Chlorhexidine Gluconate Cloth  6 each Topical Daily   escitalopram  20 mg Oral Daily   feeding supplement  237 mL Oral BID BM   finasteride  5 mg Oral Daily   heparin  5,000 Units Subcutaneous Q8H   levothyroxine  75 mcg Oral QAC breakfast   pantoprazole  40 mg Oral q1600   polyethylene glycol  17 g Oral Daily   tamsulosin  0.4 mg Oral QPC breakfast   Continuous Infusions:  sodium chloride 75 mL/hr at 12/27/22 0015   cefTRIAXone (ROCEPHIN)  IV 2 g (12/26/22 2122)     LOS: 3 days   Time spent: 35 mins  Nashley Cordoba Laural Benes, MD How to contact the [facility] Attending or Consulting provider 7A - 7P or covering provider during after hours 7P -7A, for this patient?  Check the care team in [facility] and look for a) attending/consulting [facility] provider listed  and b) the [facility] team listed Log into [email or web address] and use [facility]'s universal password to access. If you do not have the password, please contact the hospital operator. Locate the [facility] provider you are looking for under [facility]  and page to a number that you can be directly reached. If you still have difficulty reaching the provider, please page the [facility] (Director on Call) for the Hospitalists listed on amion for assistance.  12/27/2022, 12:17 PM

## 2022-12-27 NOTE — Telephone Encounter (Signed)
-----   Message from Ferdinand Lango, RN sent at 12/27/2022 11:56 AM EDT ----- Please see triage note- per Epic patient admitted

## 2022-12-27 NOTE — Progress Notes (Signed)
Mobility Specialist Progress Note:   12/27/22 1200  Mobility  Activity Ambulated with assistance in hallway  Level of Assistance Modified independent, requires aide device or extra time  Assistive Device  (IV pole)  Distance Ambulated (ft) 160 ft  Activity Response Tolerated well  Mobility Referral Yes  $Mobility charge 1 Mobility   Pt agreeable to mobility session. Tolerated well, asx throughout. Required CGA with ModI using IV pole for stability. Returned pt to room, all needs met, call bell in reach.   Feliciana Rossetti Mobility Specialist Please contact via Special educational needs teacher or  Rehab office at [phone]

## 2022-12-27 NOTE — Telephone Encounter (Signed)
Patient admitted to hospital.

## 2022-12-27 NOTE — Progress Notes (Signed)
Patient c/o urge to urinate, lower abd slightly distended. Nurse bladder scanned patient and scan showed >650 mL. Nurse notified Dr. Dorthula Perfect, new order for in and out cath. In and out cath done, 700 mL output. Blood clot came initially before urine stream, urine yellow and clear in color. Patient tolerated well, reports relief.

## 2022-12-28 DIAGNOSIS — A419 Sepsis, unspecified organism: Secondary | ICD-10-CM | POA: Diagnosis not present

## 2022-12-28 DIAGNOSIS — N390 Urinary tract infection, site not specified: Secondary | ICD-10-CM | POA: Diagnosis not present

## 2022-12-28 DIAGNOSIS — K219 Gastro-esophageal reflux disease without esophagitis: Secondary | ICD-10-CM | POA: Diagnosis not present

## 2022-12-28 DIAGNOSIS — E039 Hypothyroidism, unspecified: Secondary | ICD-10-CM | POA: Diagnosis not present

## 2022-12-28 LAB — CBC WITH DIFFERENTIAL/PLATELET
Abs Immature Granulocytes: 0.09 10*3/uL — ABNORMAL HIGH (ref 0.00–0.07)
Basophils Absolute: 0 10*3/uL (ref 0.0–0.1)
Basophils Relative: 1 %
Eosinophils Absolute: 0.2 10*3/uL (ref 0.0–0.5)
Eosinophils Relative: 4 %
HCT: 35.3 % — ABNORMAL LOW (ref 39.0–52.0)
Hemoglobin: 12 g/dL — ABNORMAL LOW (ref 13.0–17.0)
Immature Granulocytes: 1 %
Lymphocytes Relative: 13 %
Lymphs Abs: 0.8 10*3/uL (ref 0.7–4.0)
MCH: 30.2 pg (ref 26.0–34.0)
MCHC: 34 g/dL (ref 30.0–36.0)
MCV: 88.7 fL (ref 80.0–100.0)
Monocytes Absolute: 0.6 10*3/uL (ref 0.1–1.0)
Monocytes Relative: 9 %
Neutro Abs: 4.6 10*3/uL (ref 1.7–7.7)
Neutrophils Relative %: 72 %
Platelets: 236 10*3/uL (ref 150–400)
RBC: 3.98 MIL/uL — ABNORMAL LOW (ref 4.22–5.81)
RDW: 12.9 % (ref 11.5–15.5)
WBC: 6.3 10*3/uL (ref 4.0–10.5)
nRBC: 0 % (ref 0.0–0.2)

## 2022-12-28 LAB — URINE CULTURE: Culture: 20000 — AB

## 2022-12-28 LAB — CULTURE, BLOOD (ROUTINE X 2)

## 2022-12-28 MED ORDER — SILODOSIN 8 MG PO CAPS: 8.0000 mg | ORAL_CAPSULE | Freq: Every day | ORAL | 11 refills | Status: DC

## 2022-12-28 MED ORDER — CALCIUM CARBONATE ANTACID 500 MG PO CHEW: 1.0000 | CHEWABLE_TABLET | Freq: Three times a day (TID) | ORAL | 4 refills | Status: DC

## 2022-12-28 MED ORDER — FINASTERIDE 5 MG PO TABS: 5.0000 mg | ORAL_TABLET | Freq: Every day | ORAL | 3 refills | Status: DC

## 2022-12-28 MED ORDER — CEFDINIR 300 MG PO CAPS: 300.0000 mg | ORAL_CAPSULE | Freq: Two times a day (BID) | ORAL | 0 refills | Status: AC

## 2022-12-28 NOTE — Care Management Important Message (Signed)
Important Message  Patient Details  Name: Micheal Baker MRN: [id number] Date of Birth: 1946/01/24   Medicare Important Message Given:  Yes     Corey Harold 12/28/2022, 12:12 PM

## 2022-12-28 NOTE — Discharge Summary (Signed)
Micheal Baker, is a 77 y.o. male  DOB Dec 29, 1945  MRN [id number].  Admission date:  12/24/2022  Admitting Physician  Lilyan Gilford, DO  Discharge Date:  12/28/2022   Primary MD  [name], MD  Recommendations for primary care physician for things to follow:   1)Routine Foley catheter care-- 2)Please follow-up with Urologist Dr. Ronne Binning in about --- 1 to 2 weeks for recheck and follow-up evaluation  in his office----[facility], [address], [place] [address] Phone Number----[phone] 3)Repeat CBC and BMP Blood Tests in 1 to 2 weeks  4) please complete Omnicef/cefdinir antibiotic as prescribed  Admission Diagnosis  Acute cystitis without hematuria [N30.00] Sepsis due to urinary tract infection [A41.9, N39.0] Sepsis, due to unspecified organism, unspecified whether acute organ dysfunction present [A41.9]   Discharge Diagnosis  Acute cystitis without hematuria [N30.00] Sepsis due to urinary tract infection [A41.9, N39.0] Sepsis, due to unspecified organism, unspecified whether acute organ dysfunction present [A41.9]    Principal Problem:   Sepsis due to urinary tract infection Active Problems:   Hypothyroidism   GERD (gastroesophageal reflux disease)   Anxiety and depression   Hypocalcemia      Past Medical History:  Diagnosis Date   Acute medial meniscus tear of left knee    Depression    Frequent PVCs    GERD (gastroesophageal reflux disease)    Hypertension    Hypothyroidism    NSVT (nonsustained ventricular tachycardia)    Osteoarthritis    Thyroid disease     Past Surgical History:  Procedure Laterality Date   BIOPSY  12/01/2022   Procedure: BIOPSY;  Surgeon: Lanelle Bal, DO;  [facility];  Service: Endoscopy;;   CATARACT EXTRACTION     left   COLONOSCOPY N/A 04/08/2016   Surgeon: West Bali, MD; one 6 mm polyp in the  cecum removed, one 4 mm polyp in the descending colon removed, nonbleeding internal hemorrhoids.  Pathology with hyperplastic polyps.  Recommended colonoscopy in 10 years.   COLONOSCOPY  12/01/2022   COLONOSCOPY WITH PROPOFOL N/A 12/01/2022   Procedure: COLONOSCOPY WITH PROPOFOL;  Surgeon: Lanelle Bal, DO;  [facility];  Service: Endoscopy;  Laterality: N/A;  8:15 am, asa 3   endosocopy  12/01/2022   ESOPHAGOGASTRODUODENOSCOPY (EGD) WITH PROPOFOL N/A 12/01/2022   Procedure: ESOPHAGOGASTRODUODENOSCOPY (EGD) WITH PROPOFOL;  Surgeon: Lanelle Bal, DO;  [facility];  Service: Endoscopy;  Laterality: N/A;   EYE SURGERY     left-scar tissue   groin surgery Right 2010   growth removed    KNEE ARTHROSCOPY     left   KNEE ARTHROSCOPY WITH LATERAL MENISECTOMY Right 03/11/2015   Procedure: RIGHT KNEE ARTHROSCOPY WITH MEDIALAND LATERAL MENISECTOMY;  Surgeon: Salvatore Marvel, MD;  Location: [facility];  Service: Orthopedics;  Laterality: Right;   KNEE ARTHROSCOPY WITH MEDIAL MENISECTOMY Left 06/27/2014   Procedure: LEFT KNEE ARTHROSCOPY WITH PARTIAL MEDIAL AND LATERAL MENISECTOMIES AND CHONDROPLASTY;  Surgeon: Elana Alm  Thurston Hole, MD;  Location: [facility];  Service: Orthopedics;  Laterality: Left;   KNEE ARTHROSCOPY WITH MEDIAL MENISECTOMY Right 03/11/2015   Procedure: KNEE ARTHROSCOPY WITH MEDIAL MENISECTOMY;  Surgeon: Salvatore Marvel, MD;  Location: [facility];  Service: Orthopedics;  Laterality: Right;   LEFT HEART CATHETERIZATION WITH CORONARY ANGIOGRAM N/A 09/14/2011   Procedure: LEFT HEART CATHETERIZATION WITH CORONARY ANGIOGRAM;  Surgeon: [name], MD;  Location: [facility];  Service: Cardiovascular;  Laterality: N/A;   POLYPECTOMY  04/08/2016   Procedure: POLYPECTOMY;  Surgeon: West Bali, MD;  [facility];  Service: Endoscopy;;  colon    POLYPECTOMY  12/01/2022   POLYPECTOMY  12/01/2022   Procedure:  POLYPECTOMY;  Surgeon: Lanelle Bal, DO;  [facility];  Service: Endoscopy;;   RETINAL DETACHMENT SURGERY Left 2002   Dr. Hermina Barters       HPI  from the history and physical done on the day of admission:  HPI: Micheal Baker is a 77 y.o. male with medical history significant of depression, anxiety, GERD, hypertension, hypothyroidism, urinary retention requiring Foley catheter that was just removed April 10, and more presents the ED with a chief complaint of dysuria.  Patient reports he had dysuria, subjective fever, chills, rigors at home.  He thought he had some hematuria a few days ago, but has not noticed any recently.  His urine is dark at bedside.  He reports generalized weakness, malaise, fatigue.  He had a normal appetite.  He reports that he had a UTI in the past and it was last year.  At that time he was hospitalized and he thinks he has been significantly deconditioned since then.  He reports the symptoms are the same as the symptoms he had then.  On review of systems he complains of headache.  He has seen ophthalmology for it and he think it is because of dry eyes.  He is using eyedrops.  He reports Tylenol has helped with the headache at home.  The headache is present now so we requested his nurse to dispense Tylenol.  Patient really does not have any other specific complaints at this time.   Patient does complain that he has not been the same since he had urosepsis a year ago.  He reports at that time he was working a part-time job, and he has been so weak, fatigued, and never the same again, so he has not even been able to continue his part-time job.  He thinks that several health problems have happened due to having been deconditioned after his hospitalization for sepsis.  He is interested in rehab, hoping for to be at home.   Patient does not smoke.  He drinks about once per month.  He does not use illicit drugs.  He is vaccinated for COVID.  Patient is full code. Review of  Systems: As mentioned in the history of present illness. All other systems reviewed and are negative.     Hospital Course:      Klebsiella sepsis due to urinary tract infection -- Presented with dysuria, urinary incontinence, fever, chills, rigors - UA indicative of UTI - Urine culture from 12/24/2022 with Klebsiella - Blood culture NGTD - -Treated with IV Rocephin okay to discharge home on p.o. Omnicef - Patient recently had Foley catheter removed approximately 4 days prior to admission at Dr. Dimas Millin office  -Failed Voiding trial -Discharge home with Foley in situ, outpatient follow-up with Dr. Ronne Binning advised -Fever,  dysuria and leukocytosis resolved   Hypocalcemia --- Patient received IV calcium and oral tums supplement replacement given  -Continue calcium carbonate   Acute Urinary Retention  -- unfortunately developed severe acute urinary retention overnight -- foley was inserted without difficulty with immediate relief in symptoms  -- plan to leave foley in place with close follow up with Dr Ronne Binning  -- he has been maintained on finasteride and tamsulosin without interruption   Anxiety and depression - Patient required a dose of Ativan in the ER - Continue Lexapro - Continue to monitor   GERD (gastroesophageal reflux disease) - Continue PPI   Hypothyroidism - Continue Synthroid  Hypokalemia--replaced  Discharge Condition: --Stable Follow UP   Follow-up Information     Malen Gauze, MD. Schedule an appointment as soon as possible for a visit in 1 week(s).   Specialty: Urology Why: 1 to 2 weeks Contact information: [address] [place] [address] [phone]                 Consults obtained - Discussed with Dr. Ronne Binning  Diet and Activity recommendation:  As advised  Discharge Instructions    Discharge Instructions     Call MD for:  difficulty breathing, headache or visual disturbances   Complete by: As directed     Call MD for:  persistant dizziness or light-headedness   Complete by: As directed    Call MD for:  persistant nausea and vomiting   Complete by: As directed    Call MD for:  temperature >100.4   Complete by: As directed    Diet - low sodium heart healthy   Complete by: As directed    Discharge instructions   Complete by: As directed    1)Routine Foley catheter care-- 2)Please follow-up with Urologist Dr. Ronne Binning in about --- 1 to 2 weeks for recheck and follow-up evaluation  in his office----[facility], [address], [place] [address] Phone Number----[phone] 3)Repeat CBC and BMP Blood Tests in 1 to 2 weeks  4) please complete Omnicef/cefdinir antibiotic as prescribed   Increase activity slowly   Complete by: As directed        Discharge Medications     Allergies as of 12/28/2022       Reactions   Avelox [moxifloxacin] Other (See Comments)   Caused tendonitis   Penicillins Other (See Comments)   Chills        Medication List     STOP taking these medications    meloxicam 15 MG tablet Commonly known as: MOBIC       TAKE these medications    acetaminophen 500 MG tablet Commonly known as: TYLENOL Take 500-1,000 mg by mouth every 6 (six) hours as needed for mild pain, headache or moderate pain.   ALPHA LIPOIC ACID PO Take 1 capsule by mouth in the morning.   B-complex with vitamin C tablet Take 1 tablet by mouth in the morning.   calcium carbonate 500 MG chewable tablet Commonly known as: TUMS - dosed in mg elemental calcium Chew 1 tablet (200 mg of elemental calcium total) by mouth 3 (three) times daily with meals.   cefdinir 300 MG capsule Commonly known as: OMNICEF Take 1 capsule (300 mg total) by mouth 2 (two) times daily for 3 days.   escitalopram 20 MG tablet Commonly known as: LEXAPRO Take 20 mg by mouth daily.   finasteride 5 MG tablet Commonly known as: PROSCAR Take 1 tablet (  5 mg total) by mouth daily.    fluticasone 50 MCG/ACT nasal spray Commonly known as: FLONASE Place 1 spray into both nostrils daily as needed for allergies.   levothyroxine 75 MCG tablet Commonly known as: SYNTHROID Take 75 mcg by mouth daily before breakfast.   pantoprazole 40 MG tablet Commonly known as: PROTONIX Take 40 mg by mouth daily.   silodosin 8 MG Caps capsule Commonly known as: RAPAFLO Take 1 capsule (8 mg total) by mouth at bedtime.   Voltaren 1 % Gel Generic drug: diclofenac Sodium Apply 1 Application topically 4 (four) times daily as needed (pain.).       Major procedures and Radiology Reports - PLEASE review detailed and final reports for all details, in brief -   MR Knee Left  Wo Contrast  Result Date: 12/22/2022 CLINICAL DATA:  Knee pain, chronic, no prior imaging (Age >= 5y) EXAM: MRI OF THE LEFT KNEE WITHOUT CONTRAST TECHNIQUE: Multiplanar, multisequence MR imaging of the knee was performed. No intravenous contrast was administered. COMPARISON:  None Available. FINDINGS: MENISCI Medial: Small nondisplaced tear of the posterior horn reaching the meniscal undersurface (sagittal PD images 9 and 10). Lateral: Intact lateral meniscus. LIGAMENTS Cruciates: Mucoid degeneration of the ACL with ganglion cyst formation proximally. Collaterals: Medial collateral ligament is intact. Lateral collateral ligament complex is intact. CARTILAGE Patellofemoral: Areas of intermediate to high-grade chondrosis of the patellar facets with underlying subchondral marrow edema. Medial:  Intermediate grade partial thickness cartilage loss. Lateral: Low to intermediate grade chondrosis most prominent along the posterior weight-bearing lateral femoral condyle. JOINT: Trace joint effusion with decompression along the popliteus tendon. POPLITEAL FOSSA: Small Baker's cyst. EXTENSOR MECHANISM: Intact quadriceps tendon. Intact patellar tendon. BONES: No evidence of acute fracture. No aggressive osseous lesion. There is marrow edema  at the ACL attachments related to intraosseous ganglion formation. There is subchondral marrow edema related to arthritis. Tricompartment osteophyte formation. No aggressive osseous lesion. No evidence of acute fracture. Other: No focal fluid collection. IMPRESSION: Small nondisplaced tear of the posterior horn of the medial meniscus reaching the meniscal undersurface. 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 Mucoid degeneration of the ACL. Small Baker's cyst. Electronically Signed   By: Caprice Renshaw M.D.   On: 12/22/2022 08:01    Micro Results  Recent Results (from the past 240 hour(s))  Remove urinary catheter to obtain Clean Catch urine culture     Status: Abnormal   Collection Time: 12/24/22  9:11 PM   Specimen: Urine, Clean Catch  Result Value Ref Range Status   Specimen Description   Final    URINE, CLEAN CATCH Performed at [facility], [address] [place], [place] [address]    Special Requests   Final    NONE Performed at [facility], [address] [place], [place] [address]    Culture (A)  Final    <10,000 COLONIES/mL INSIGNIFICANT GROWTH Performed at [facility], [address] [place], [place] [address]    Report Status 12/26/2022 FINAL  Final  Urine Culture     Status: Abnormal   Collection Time: 12/24/22 10:11 PM   Specimen: Urine, Clean Catch  Result Value Ref Range Status   Specimen Description   Final    URINE, CLEAN CATCH Performed at [facility], [address], [place], [place] [address]    Special Requests   Final    NONE Performed at [facility], [address], [place], [place] [address]  Culture 20,000 COLONIES/mL KLEBSIELLA SPECIES (A)  Final   Report Status 12/28/2022 FINAL  Final   Organism ID, Bacteria KLEBSIELLA SPECIES (A)  Final      Susceptibility   Klebsiella species - MIC*    AMPICILLIN RESISTANT Resistant     CEFEPIME <=0.12 SENSITIVE  Sensitive     CEFTRIAXONE <=0.25 SENSITIVE Sensitive     CIPROFLOXACIN <=0.25 SENSITIVE Sensitive     GENTAMICIN <=1 SENSITIVE Sensitive     IMIPENEM 0.5 SENSITIVE Sensitive     NITROFURANTOIN 32 SENSITIVE Sensitive     TRIMETH/SULFA <=20 SENSITIVE Sensitive     AMPICILLIN/SULBACTAM <=2 SENSITIVE Sensitive     PIP/TAZO <=4 SENSITIVE Sensitive     * 20,000 COLONIES/mL KLEBSIELLA SPECIES  Blood Culture (routine x 2)     Status: None (Preliminary result)   Collection Time: 12/24/22 10:57 PM   Specimen: Right Antecubital; Blood  Result Value Ref Range Status   Specimen Description   Final    RIGHT ANTECUBITAL BOTTLES DRAWN AEROBIC AND ANAEROBIC   Special Requests Blood Culture adequate volume  Final   Culture   Final    NO GROWTH 4 DAYS Performed at [facility], [address], [place], [place] [address]    Report Status PENDING  Incomplete  Blood Culture (routine x 2)     Status: None (Preliminary result)   Collection Time: 12/24/22 10:59 PM   Specimen: Left Antecubital; Blood  Result Value Ref Range Status   Specimen Description   Final    LEFT ANTECUBITAL BOTTLES DRAWN AEROBIC AND ANAEROBIC   Special Requests Blood Culture adequate volume  Final   Culture   Final    NO GROWTH 4 DAYS Performed at [facility], [address] [place], [place] [address]    Report Status PENDING  Incomplete  Culture, blood (Routine X 2) w Reflex to ID Panel     Status: None (Preliminary result)   Collection Time: 12/26/22  4:56 AM   Specimen: Site Not Specified; Blood  Result Value Ref Range Status   Specimen Description   Final    SITE NOT SPECIFIED BOTTLES DRAWN AEROBIC AND ANAEROBIC   Special Requests Blood Culture adequate volume  Final   Culture   Final    NO GROWTH 2 DAYS Performed at [facility], [address], [place], [place] [address]    Report Status PENDING  Incomplete  Culture, blood (Routine X 2) w Reflex to ID Panel     Status: None (Preliminary result)   Collection Time:  12/26/22  4:56 AM   Specimen: Site Not Specified; Blood  Result Value Ref Range Status   Specimen Description   Final    SITE NOT SPECIFIED BOTTLES DRAWN AEROBIC AND ANAEROBIC   Special Requests Blood Culture adequate volume  Final   Culture   Final    NO GROWTH 2 DAYS Performed at [facility], [address], [place], [place] [address]    Report Status PENDING  Incomplete    Today   Subjective    Jerre Vandrunen today has no new complaints - No fever  Or chills   No Nausea, Vomiting or Diarrhea   Patient has been seen and examined prior to discharge   Objective   Blood pressure (!) 140/86, pulse 71, temperature 98.6 F (37 C), resp. rate 16, height 6\' 5"  (1.956 m), weight 95.3 kg, SpO2 98 %.   Intake/Output Summary (Last 24 hours) at 12/28/2022 1227 Last data filed at 12/28/2022 0700 Gross per  24 hour  Intake 990 ml  Output 4975 ml  Net -3985 ml    Exam Gen:- Awake Alert, no acute distress  HEENT:- [place].AT, No sclera icterus Neck-Supple Neck,No JVD,.  Lungs-  CTAB , good air movement bilaterally CV- S1, S2 normal, regular Abd-  +ve B.Sounds, Abd Soft, No tenderness, no CVA area tenderness    Extremity/Skin:- No  edema,   good pulses Psych-affect is appropriate, oriented x3 Neuro-no new focal deficits, no tremors  GU--- foley in situ with clear urine   Data Review   CBC w Diff:  Lab Results  Component Value Date   WBC 6.3 12/28/2022   HGB 12.0 (L) 12/28/2022   HCT 35.3 (L) 12/28/2022   PLT 236 12/28/2022   LYMPHOPCT 13 12/28/2022   MONOPCT 9 12/28/2022   EOSPCT 4 12/28/2022   BASOPCT 1 12/28/2022    CMP:  Lab Results  Component Value Date   NA 133 (L) 12/27/2022   K 3.4 (L) 12/27/2022   CL 106 12/27/2022   CO2 22 12/27/2022   BUN 13 12/27/2022   CREATININE 0.89 12/27/2022   CREATININE 1.04 01/30/2015   PROT 6.5 12/25/2022   ALBUMIN 3.6 12/25/2022   BILITOT 1.0 12/25/2022   ALKPHOS 56 12/25/2022   AST 21 12/25/2022   ALT 19 12/25/2022   .  Total Discharge time is about 33 minutes  Shon Hale M.D on 12/28/2022 at 12:27 PM  Go to [email or web address] -  for contact info  [facility] - Office  [phone]

## 2022-12-28 NOTE — Progress Notes (Signed)
Mobility Specialist Progress Note:    12/28/22 1151  Mobility  Activity Ambulated with assistance in hallway;Ambulated with assistance to bathroom  Level of Assistance Modified independent, requires aide device or extra time  Assistive Device Other (Comment) (IV pole)  Distance Ambulated (ft) 160 ft  Activity Response Tolerated well  Mobility Referral Yes  $Mobility charge 1 Mobility   Pt agreeable to mobility session. Tolerated well, asx throughout. Returned pt to room, assisted with transfer to bathroom. Pt returned to EOB, all needs met.   Feliciana Rossetti Mobility Specialist Please contact via Special educational needs teacher or  Rehab office at [phone]

## 2022-12-29 ENCOUNTER — Telehealth: Payer: Self-pay

## 2022-12-29 LAB — CULTURE, BLOOD (ROUTINE X 2)
Culture: NO GROWTH
Culture: NO GROWTH
Special Requests: ADEQUATE
Special Requests: ADEQUATE
Special Requests: ADEQUATE

## 2022-12-29 NOTE — Telephone Encounter (Signed)
Patient scheduled for Monday after hospital follow up with urinary retention.

## 2022-12-30 ENCOUNTER — Telehealth: Payer: Self-pay

## 2022-12-30 DIAGNOSIS — A419 Sepsis, unspecified organism: Secondary | ICD-10-CM

## 2022-12-30 LAB — CULTURE, BLOOD (ROUTINE X 2)
Culture: NO GROWTH
Special Requests: ADEQUATE

## 2022-12-30 NOTE — Consult Note (Addendum)
   [facility] Inpatient Consult   12/30/2022  Micheal Baker Mar 11, 1946 [id number]     Location: [facility] RN Hospital Liaison screened remotely [facility]).   [facility] Customer service manager [facility]) Accountable Care Organization [ACO] Patient: Chemical engineer)   Primary Care Provider:  Dr. Phillips Odor [facility])  Patient screened for readmission hospitalization with noted low risk score for unplanned readmission risk with  1 IP/1 ED in 6 months. [facility]/Population Health RN liaison will assess for potential [facility]) Care Management service needs for post hospital transition for care coordination. Spoke with pt telephonically concerning [facility] services. Pt receptive to a post hospital follow up call for [facility] services.   Plan. HIPAA verified and [facility]/Population Health RN Liaison will continue to follow ongoing disposition in assessing for post hospital community care coordination/management needs.  Referral request for community care coordination: Will make a referral due to admitting diagnosis and pt is receptive to a follow up call. Pt home with [facility] services arranged.   [facility] Care Management/Population Health does not replace or interfere with any arrangements made by the Inpatient Transition of Care team.   For questions contact:     Micheal Cousin, RN, BSN [facility] Liaison [place]   [facility]  Population Health Office Hours MTWF 8:00 am to 6 pm off on Thursday [phone] mobile [phone] [Office toll free line][facility] Office Hours are M-F 8:30 - 5 pm 24 hour nurse advise line [phone] Conceirge  [email or web address]

## 2022-12-30 NOTE — Patient Outreach (Signed)
Received: Hospital Liaison referral from Elliot Cousin, RN Sent the referral to the [facility] Care Guides to call and assign a Nurse Care Coordinator.    Iverson Alamin, [facility] Care Management Assistant [facility] Care Management [phone]

## 2022-12-31 DIAGNOSIS — R339 Retention of urine, unspecified: Secondary | ICD-10-CM | POA: Diagnosis not present

## 2022-12-31 DIAGNOSIS — I4729 Other ventricular tachycardia: Secondary | ICD-10-CM | POA: Diagnosis not present

## 2022-12-31 DIAGNOSIS — F419 Anxiety disorder, unspecified: Secondary | ICD-10-CM | POA: Diagnosis not present

## 2022-12-31 DIAGNOSIS — M170 Bilateral primary osteoarthritis of knee: Secondary | ICD-10-CM | POA: Diagnosis not present

## 2022-12-31 DIAGNOSIS — K219 Gastro-esophageal reflux disease without esophagitis: Secondary | ICD-10-CM | POA: Diagnosis not present

## 2022-12-31 DIAGNOSIS — I10 Essential (primary) hypertension: Secondary | ICD-10-CM | POA: Diagnosis not present

## 2022-12-31 DIAGNOSIS — N390 Urinary tract infection, site not specified: Secondary | ICD-10-CM | POA: Diagnosis not present

## 2022-12-31 DIAGNOSIS — A419 Sepsis, unspecified organism: Secondary | ICD-10-CM | POA: Diagnosis not present

## 2022-12-31 DIAGNOSIS — F32A Depression, unspecified: Secondary | ICD-10-CM | POA: Diagnosis not present

## 2022-12-31 LAB — CULTURE, BLOOD (ROUTINE X 2): Culture: NO GROWTH

## 2023-01-02 ENCOUNTER — Ambulatory Visit: Payer: Medicare PPO | Admitting: Urology

## 2023-01-02 VITALS — BP 171/78 | HR 91

## 2023-01-02 DIAGNOSIS — N138 Other obstructive and reflux uropathy: Secondary | ICD-10-CM

## 2023-01-02 DIAGNOSIS — N401 Enlarged prostate with lower urinary tract symptoms: Secondary | ICD-10-CM

## 2023-01-02 DIAGNOSIS — R339 Retention of urine, unspecified: Secondary | ICD-10-CM

## 2023-01-02 MED ORDER — SULFAMETHOXAZOLE-TRIMETHOPRIM 800-160 MG PO TABS: 1.0000 | ORAL_TABLET | Freq: Once | ORAL | 0 refills | Status: AC

## 2023-01-02 NOTE — Progress Notes (Unsigned)
01/02/2023 11:24 AM   Micheal Baker 1946/01/03 [id number]  Referring provider: Assunta Baker, Micheal Baker [address] [place],  [place] [address]  Followup urinary retention.   HPI: Micheal Baker is a 76yo here for followup for urinary retention. He developed sepsis after his voiding trial on 4/10. He was admitted 4/13 and discharged 4/17. He has an indwelling foley. He feels fatigued today. He wishes to have the foley removed.     PMH: Past Medical History:  Diagnosis Date   Acute medial meniscus tear of left knee    Depression    Frequent PVCs    GERD (gastroesophageal reflux disease)    Hypertension    Hypothyroidism    NSVT (nonsustained ventricular tachycardia)    Osteoarthritis    Thyroid disease     Surgical History: Past Surgical History:  Procedure Laterality Date   BIOPSY  12/01/2022   Procedure: BIOPSY;  Surgeon: Lanelle Bal, DO;  [facility];  Service: Endoscopy;;   CATARACT EXTRACTION     left   COLONOSCOPY N/A 04/08/2016   Surgeon: West Bali, Micheal Baker; one 6 mm polyp in the cecum removed, one 4 mm polyp in the descending colon removed, nonbleeding internal hemorrhoids.  Pathology with hyperplastic polyps.  Recommended colonoscopy in 10 years.   COLONOSCOPY  12/01/2022   COLONOSCOPY WITH PROPOFOL N/A 12/01/2022   Procedure: COLONOSCOPY WITH PROPOFOL;  Surgeon: Lanelle Bal, DO;  [facility];  Service: Endoscopy;  Laterality: N/A;  8:15 am, asa 3   endosocopy  12/01/2022   ESOPHAGOGASTRODUODENOSCOPY (EGD) WITH PROPOFOL N/A 12/01/2022   Procedure: ESOPHAGOGASTRODUODENOSCOPY (EGD) WITH PROPOFOL;  Surgeon: Lanelle Bal, DO;  [facility];  Service: Endoscopy;  Laterality: N/A;   EYE SURGERY     left-scar tissue   groin surgery Right 2010   growth removed    KNEE ARTHROSCOPY     left   KNEE ARTHROSCOPY WITH LATERAL MENISECTOMY Right 03/11/2015   Procedure: RIGHT KNEE ARTHROSCOPY WITH MEDIALAND LATERAL MENISECTOMY;   Surgeon: Salvatore Marvel, Micheal Baker;  Location: [facility];  Service: Orthopedics;  Laterality: Right;   KNEE ARTHROSCOPY WITH MEDIAL MENISECTOMY Left 06/27/2014   Procedure: LEFT KNEE ARTHROSCOPY WITH PARTIAL MEDIAL AND LATERAL MENISECTOMIES AND CHONDROPLASTY;  Surgeon: Nilda Simmer, Micheal Baker;  Location: [facility];  Service: Orthopedics;  Laterality: Left;   KNEE ARTHROSCOPY WITH MEDIAL MENISECTOMY Right 03/11/2015   Procedure: KNEE ARTHROSCOPY WITH MEDIAL MENISECTOMY;  Surgeon: Salvatore Marvel, Micheal Baker;  Location: [facility];  Service: Orthopedics;  Laterality: Right;   LEFT HEART CATHETERIZATION WITH CORONARY ANGIOGRAM N/A 09/14/2011   Procedure: LEFT HEART CATHETERIZATION WITH CORONARY ANGIOGRAM;  Surgeon: [name], Micheal Baker;  Location: [facility];  Service: Cardiovascular;  Laterality: N/A;   POLYPECTOMY  04/08/2016   Procedure: POLYPECTOMY;  Surgeon: West Bali, Micheal Baker;  [facility];  Service: Endoscopy;;  colon    POLYPECTOMY  12/01/2022   POLYPECTOMY  12/01/2022   Procedure: POLYPECTOMY;  Surgeon: Lanelle Bal, DO;  [facility];  Service: Endoscopy;;   RETINAL DETACHMENT SURGERY Left 2002   Dr. Hermina Barters    Home Medications:  Allergies as of 01/02/2023       Reactions   Avelox [moxifloxacin] Other (See Comments)   Caused tendonitis   Penicillins Other (See Comments)   Chills        Medication List        Accurate as of January 02, 2023 11:24 AM. If you have any questions, ask your nurse or doctor.          acetaminophen 500 MG tablet Commonly known as: TYLENOL Take 500-1,000 mg by mouth every 6 (six) hours as needed for mild pain, headache or moderate pain.   ALPHA LIPOIC ACID PO Take 1 capsule by mouth in the morning.   B-complex with vitamin C tablet Take 1 tablet by mouth in the morning.   calcium carbonate 500 MG chewable tablet Commonly known as: TUMS - dosed in mg elemental calcium Chew 1 tablet  (200 mg of elemental calcium total) by mouth 3 (three) times daily with meals.   escitalopram 20 MG tablet Commonly known as: LEXAPRO Take 20 mg by mouth daily.   finasteride 5 MG tablet Commonly known as: PROSCAR Take 1 tablet (5 mg total) by mouth daily.   fluticasone 50 MCG/ACT nasal spray Commonly known as: FLONASE Place 1 spray into both nostrils daily as needed for allergies.   levothyroxine 75 MCG tablet Commonly known as: SYNTHROID Take 75 mcg by mouth daily before breakfast.   pantoprazole 40 MG tablet Commonly known as: PROTONIX Take 40 mg by mouth daily.   silodosin 8 MG Caps capsule Commonly known as: RAPAFLO Take 1 capsule (8 mg total) by mouth at bedtime.   Voltaren 1 % Gel Generic drug: diclofenac Sodium Apply 1 Application topically 4 (four) times daily as needed (pain.).        Allergies:  Allergies  Allergen Reactions   Avelox [Moxifloxacin] Other (See Comments)    Caused tendonitis   Penicillins Other (See Comments)    Chills     Family History: Family History  Problem Relation Age of Onset   Hypertension Mother    Hypertension Father    Heart attack Father    Diabetes Father    Asthma Daughter    Colon cancer Neg Hx    Stomach cancer Neg Hx     Social History:  reports that he has never smoked. He has never used smokeless tobacco. He reports current alcohol use. He reports that he does not use drugs.  ROS: All other review of systems were reviewed and are negative except what is noted above in HPI  Physical Exam: BP (!) 171/78   Pulse 91   Constitutional:  Alert and oriented, No acute distress. HEENT: [place] AT, moist mucus membranes.  Trachea midline, no masses. Cardiovascular: No clubbing, cyanosis, or edema. Respiratory: Normal respiratory effort, no increased work of breathing. GI: Abdomen is soft, nontender, nondistended, no abdominal masses GU: No CVA tenderness.  Lymph: No cervical or inguinal lymphadenopathy. Skin: No  rashes, bruises or suspicious lesions. Neurologic: Grossly intact, no focal deficits, moving all 4 extremities. Psychiatric: Normal mood and affect.  Laboratory Data: Lab Results  Component Value Date   WBC 6.3 12/28/2022   HGB 12.0 (L) 12/28/2022   HCT 35.3 (L) 12/28/2022   MCV 88.7 12/28/2022   PLT 236 12/28/2022    Lab Results  Component Value Date   CREATININE 0.89 12/27/2022    No results Baker for: "PSA"  No results Baker for: "TESTOSTERONE"  No results Baker for: "HGBA1C"  Urinalysis    Component Value Date/Time   COLORURINE YELLOW 12/24/2022 2111   APPEARANCEUR HAZY (A) 12/24/2022 2111   APPEARANCEUR Clear 10/07/2022 1143   LABSPEC 1.012 12/24/2022 2111   PHURINE 7.0 12/24/2022 2111   GLUCOSEU NEGATIVE 12/24/2022 2111   HGBUR SMALL (A) 12/24/2022 2111   BILIRUBINUR NEGATIVE  12/24/2022 2111   BILIRUBINUR Negative 10/07/2022 1143   KETONESUR NEGATIVE 12/24/2022 2111   PROTEINUR 30 (A) 12/24/2022 2111   UROBILINOGEN 0.2 08/08/2011 0800   NITRITE NEGATIVE 12/24/2022 2111   LEUKOCYTESUR MODERATE (A) 12/24/2022 2111    Lab Results  Component Value Date   LABMICR Comment 10/07/2022   WBCUA 6-10 (A) 02/15/2022   LABEPIT 0-10 02/15/2022   MUCUS Present 02/15/2022   BACTERIA RARE (A) 12/24/2022    Pertinent Imaging:  No results Baker for this or any previous visit.  No results Baker for this or any previous visit.  No results Baker for this or any previous visit.  No results Baker for this or any previous visit.  No results Baker for this or any previous visit.  No valid procedures specified. No results Baker for this or any previous visit.  Results for orders placed during the hospital encounter of 01/30/22  CT Renal Stone Study  Narrative CLINICAL DATA:  Flank pain.  EXAM: CT ABDOMEN AND PELVIS WITHOUT CONTRAST  TECHNIQUE: Multidetector CT imaging of the abdomen and pelvis was performed following the standard protocol without IV  contrast.  RADIATION DOSE REDUCTION: This exam was performed according to the departmental dose-optimization program which includes automated exposure control, adjustment of the mA and/or kV according to patient size and/or use of iterative reconstruction technique.  COMPARISON:  CT abdomen and pelvis 01/18/2007  FINDINGS: Lower chest: There is a new 6 mm nodule in the left lung base. Lung bases are otherwise clear. There are calcified granulomas in the lung bases.  Hepatobiliary: Calcified granulomas are present. There is a 3 cm cyst in the inferior right lobe of the liver which has slightly increased in size compared to the prior study. Gallbladder and bile ducts are within normal limits.  Pancreas: Unremarkable. No pancreatic ductal dilatation or surrounding inflammatory changes.  Spleen: Calcified granulomas are present. Otherwise within normal limits.  Adrenals/Urinary Tract: Foley catheter is seen within the bladder. No urinary tract calculus or hydronephrosis. Adrenal glands within normal limits.  Stomach/Bowel: Stomach is within normal limits. Appendix appears normal. No evidence of bowel wall thickening, distention, or inflammatory changes. Appendix is not seen. Small hiatal hernia and duodenal diverticulum again noted.  Vascular/Lymphatic: Aortic atherosclerosis. No enlarged abdominal or pelvic lymph nodes.  Reproductive: Prostate gland is enlarged and lobulated with nodular protrusion into the bladder base. Prostate measures 7.0 x 7.3 by 5.9 cm and has significantly increased in size.  Other: No ascites.  Small fat containing inguinal hernias.  Musculoskeletal: No acute or significant osseous findings.  IMPRESSION: 1. No evidence for urinary tract calculus or hydronephrosis. 2. Foley catheter in the bladder. 3. Marked enlargement of the prostate gland with nodular protrusion into the bladder base, new from 2008. Recommend clinical correlation and follow-up  to exclude neoplasm. 4. Hepatic cyst. 5. Old granulomatous disease. 6. 6 mm left solid pulmonary nodule. Recommend a non-contrast Chest CT at 6-12 months. If patient is high risk for malignancy, recommend an additional non-contrast Chest CT at 18-24 months; if patient is low risk for malignancy a non-contrast Chest CT at 18-24 months is optional. These guidelines do not apply to immunocompromised patients and patients with cancer. Follow up in patients with significant comorbidities as clinically warranted. For lung cancer screening, adhere to Lung-RADS guidelines. Reference: Radiology. 2017; 284(1):228-43.   Electronically Signed By: Darliss Cheney M.D. On: 01/30/2022 16:15   Assessment & Plan:    1. Urinary retention -voiding trial pased today. Followup 1 month  with [facility]  2. Benign prostatic hyperplasia with urinary obstruction -continue rapaflo 8mg  daily   No follow-ups on file.  Micheal Aye, Micheal Baker  [facility] [place]

## 2023-01-02 NOTE — Progress Notes (Signed)
Fill and Pull Catheter Removal  Patient is present today for a catheter removal.  Patient was cleaned and prepped in a sterile fashion 200 ml of sterile water/ saline was instilled into the bladder when the patient felt the urge to urinate. 10 ml of water was then drained from the balloon.  A 16 FR foley cath was removed from the bladder no complications were noted .  Patient as then given some time to void on their own.  Patient can void  275 ml on their own after some time.  Patient tolerated well.  Performed by: Kennyth Lose, CMA  Follow up/ Additional notes: Keep scheduled appt

## 2023-01-03 ENCOUNTER — Telehealth: Payer: Self-pay | Admitting: *Deleted

## 2023-01-03 ENCOUNTER — Ambulatory Visit: Payer: Self-pay | Admitting: Licensed Clinical Social Worker

## 2023-01-03 ENCOUNTER — Encounter: Payer: Self-pay | Admitting: Urology

## 2023-01-03 DIAGNOSIS — A419 Sepsis, unspecified organism: Secondary | ICD-10-CM | POA: Diagnosis not present

## 2023-01-03 DIAGNOSIS — I10 Essential (primary) hypertension: Secondary | ICD-10-CM | POA: Diagnosis not present

## 2023-01-03 DIAGNOSIS — R339 Retention of urine, unspecified: Secondary | ICD-10-CM | POA: Diagnosis not present

## 2023-01-03 DIAGNOSIS — F32A Depression, unspecified: Secondary | ICD-10-CM | POA: Diagnosis not present

## 2023-01-03 DIAGNOSIS — N390 Urinary tract infection, site not specified: Secondary | ICD-10-CM | POA: Diagnosis not present

## 2023-01-03 DIAGNOSIS — K219 Gastro-esophageal reflux disease without esophagitis: Secondary | ICD-10-CM | POA: Diagnosis not present

## 2023-01-03 DIAGNOSIS — I4729 Other ventricular tachycardia: Secondary | ICD-10-CM | POA: Diagnosis not present

## 2023-01-03 DIAGNOSIS — F419 Anxiety disorder, unspecified: Secondary | ICD-10-CM | POA: Diagnosis not present

## 2023-01-03 DIAGNOSIS — M170 Bilateral primary osteoarthritis of knee: Secondary | ICD-10-CM | POA: Diagnosis not present

## 2023-01-03 NOTE — Patient Instructions (Signed)

## 2023-01-03 NOTE — Progress Notes (Signed)
  Care Coordination   Note   01/03/2023 Name: Micheal Baker MRN: [id number] DOB: [birth year]  Micheal Baker is a 77 y.o. year old male who sees [name], MD for primary care. I reached out to [name] by phone today to offer care coordination services.  [name] was given information about Care Coordination services today including:   The Care Coordination services include support from the care team which includes your Nurse Coordinator, Clinical Social Worker, or Pharmacist.  The Care Coordination team is here to help remove barriers to the health concerns and goals most important to you. Care Coordination services are voluntary, and the patient may decline or stop services at any time by request to their care team member.   Care Coordination Consent Status: Patient agreed to services and verbal consent obtained.   Follow up plan:  Telephone appointment with care coordination team member scheduled for:  01/03/23   and [facility] 01/09/23  Encounter Outcome:  Pt. Scheduled  [facility]  Care Coordination Care Guide  Direct Dial: [phone]

## 2023-01-03 NOTE — Patient Instructions (Signed)
Visit Information  Thank you for taking time to visit with me today. Please don't hesitate to contact me if I can be of assistance to you.   Following are the goals we discussed today:   Goals Addressed             This Visit's Progress    Patient Stated he had foley catheter removed recently. He had colonoscopy and endoscopy . He is fatigued from needing tests and care. He recently discharged from the hospital. Decreased energy       Interventions: Discussed client needs with [name]. Discussed his recent hospitalization. Discussed his recent tests: Colonoscopy and Endoscopy Discussed energy level. He has decreased energy.  He does have Hypothyroidism.  He plans to talk with PCP about his energy level. Discussed care with PCP. He sees Dr. [name] as PCP Reviewed depression symptoms. He said he has received hospital care recently and was discharged. He has history of UTIs. He sees urologist for urology needs Discussed mood of client. He said he takes Lexapro as scheduled He said he plans to talk with PCP about his dose amoutnt of Lexapro Provided counseling support Encouraged client to call LCSW for SW support as needed at [phone]. Discussed ambulation of client. He may have some difficulty walking. He said he is scheduled to start getting physical therapy services in his home (starting today) Client was appreciative of communication today with LCSW        Our next appointment is by telephone on 02/20/23 at 11:00 AM  Please call the care guide team at [phone] if you need to cancel or reschedule your appointment.   If you are experiencing a Mental Health or Behavioral Health Crisis or need someone to talk to, please go to [facility] [address], [place] [phone])   The patient verbalized understanding of instructions, educational materials, and care plan provided today and DECLINED offer to receive copy of patient  instructions, educational materials, and care plan.   The patient has been provided with contact information for the care management team and has been advised to call with any health related questions or concerns.   Kelton Pillar.Keyauna Graefe MSW, LCSW Licensed Visual merchandiser [facility] Care Management [phone]

## 2023-01-03 NOTE — Patient Outreach (Signed)
  Care Coordination   Initial Visit Note   01/03/2023 Name: Micheal Baker MRN: [id number] DOB: 10/04/1945  Micheal Baker is a 77 y.o. year old male who sees [name], MD for primary care. I spoke with  [name] by phone today.  What matters to the patients health and wellness today?  He is fatigued from needing tests and care. He recently discharged from the hospital. He has decreased energy    Goals Addressed             This Visit's Progress    Patient Stated he had foley catheter removed recently. He had colonoscopy and endoscopy . He is fatigued from needing tests and care. He recently discharged from the hospital. Decreased energy       Interventions: Discussed client needs with [name]. Discussed his recent hospitalization. Discussed his recent tests: Colonoscopy and Endoscopy Discussed energy level. He has decreased energy.  He does have Hypothyroidism.  He plans to talk with PCP about his energy level. Discussed care with PCP. He sees Dr. [name] as PCP Reviewed depression symptoms. He said he has received hospital care recently and was discharged. He has history of UTIs. He sees urologist for urology needs Discussed mood of client. He said he takes Lexapro as scheduled He said he plans to talk with PCP about his dose amoutnt of Lexapro Provided counseling support Encouraged client to call LCSW for SW support as needed at [phone]. Discussed ambulation of client. He may have some difficulty walking. He said he is scheduled to start getting physical therapy services in his home (starting today) Client was appreciative of communication today with LCSW       SDOH assessments and interventions completed:  Yes  SDOH Interventions Today    Flowsheet Row Most Recent Value  SDOH Interventions   Depression Interventions/Treatment  Counseling  Physical Activity Interventions Other (Comments)  [client is scheduled to start receiving physical therapy sessions  today in his home]  Stress Interventions Provide Counseling  [client has stress related to managing medical needs]        Care Coordination Interventions:  Yes, provided    Interventions Today    Flowsheet Row Most Recent Value  Chronic Disease   Chronic disease during today's visit Other  [LCSW spoke with client about client needs. He spoke of having a foley catheter removed recently. He spoke of colonoscopy. He spoke of having endoscopy]  General Interventions   General Interventions Discussed/Reviewed General Interventions Discussed, [employer] program support with RN, LCSW and Pharmacist]  Exercise Interventions   Exercise Discussed/Reviewed Physical Activity  [client is scheduled to start receiving physical therapy sessions today in his home]  Physical Activity Discussed/Reviewed Physical Activity Reviewed  Education Interventions   Education Provided Provided Education  Provided Verbal Education On Community Resources  Mental Health Interventions   Mental Health Discussed/Reviewed Anxiety, Coping Strategies  [discussed managing depression. discussed anxiety management]  Nutrition Interventions   Nutrition Discussed/Reviewed Nutrition Discussed  Pharmacy Interventions   Pharmacy Dicussed/Reviewed Pharmacy Topics Discussed  Safety Interventions   Safety Discussed/Reviewed Home Safety       Follow up plan: Follow up call scheduled for 02/20/23 at 11:00 AM    Encounter Outcome:  Pt. Visit Completed   Kelton Pillar.Tashan Kreitzer MSW, LCSW Licensed Visual merchandiser [facility] Care Management [phone]

## 2023-01-04 DIAGNOSIS — M9905 Segmental and somatic dysfunction of pelvic region: Secondary | ICD-10-CM | POA: Diagnosis not present

## 2023-01-04 DIAGNOSIS — M9902 Segmental and somatic dysfunction of thoracic region: Secondary | ICD-10-CM | POA: Diagnosis not present

## 2023-01-04 DIAGNOSIS — M25561 Pain in right knee: Secondary | ICD-10-CM | POA: Diagnosis not present

## 2023-01-04 DIAGNOSIS — M62830 Muscle spasm of back: Secondary | ICD-10-CM | POA: Diagnosis not present

## 2023-01-04 DIAGNOSIS — M9903 Segmental and somatic dysfunction of lumbar region: Secondary | ICD-10-CM | POA: Diagnosis not present

## 2023-01-05 DIAGNOSIS — M170 Bilateral primary osteoarthritis of knee: Secondary | ICD-10-CM | POA: Diagnosis not present

## 2023-01-05 DIAGNOSIS — F32A Depression, unspecified: Secondary | ICD-10-CM | POA: Diagnosis not present

## 2023-01-05 DIAGNOSIS — N390 Urinary tract infection, site not specified: Secondary | ICD-10-CM | POA: Diagnosis not present

## 2023-01-05 DIAGNOSIS — I10 Essential (primary) hypertension: Secondary | ICD-10-CM | POA: Diagnosis not present

## 2023-01-05 DIAGNOSIS — K219 Gastro-esophageal reflux disease without esophagitis: Secondary | ICD-10-CM | POA: Diagnosis not present

## 2023-01-05 DIAGNOSIS — R339 Retention of urine, unspecified: Secondary | ICD-10-CM | POA: Diagnosis not present

## 2023-01-05 DIAGNOSIS — A419 Sepsis, unspecified organism: Secondary | ICD-10-CM | POA: Diagnosis not present

## 2023-01-05 DIAGNOSIS — F419 Anxiety disorder, unspecified: Secondary | ICD-10-CM | POA: Diagnosis not present

## 2023-01-05 DIAGNOSIS — I4729 Other ventricular tachycardia: Secondary | ICD-10-CM | POA: Diagnosis not present

## 2023-01-06 ENCOUNTER — Ambulatory Visit: Payer: Medicare PPO | Admitting: Pulmonary Disease

## 2023-01-09 ENCOUNTER — Ambulatory Visit: Payer: Self-pay | Admitting: *Deleted

## 2023-01-09 ENCOUNTER — Encounter: Payer: Self-pay | Admitting: *Deleted

## 2023-01-09 ENCOUNTER — Other Ambulatory Visit: Payer: Medicare PPO

## 2023-01-09 DIAGNOSIS — M170 Bilateral primary osteoarthritis of knee: Secondary | ICD-10-CM | POA: Diagnosis not present

## 2023-01-09 DIAGNOSIS — R339 Retention of urine, unspecified: Secondary | ICD-10-CM | POA: Diagnosis not present

## 2023-01-09 DIAGNOSIS — I10 Essential (primary) hypertension: Secondary | ICD-10-CM | POA: Diagnosis not present

## 2023-01-09 DIAGNOSIS — N401 Enlarged prostate with lower urinary tract symptoms: Secondary | ICD-10-CM

## 2023-01-09 DIAGNOSIS — N390 Urinary tract infection, site not specified: Secondary | ICD-10-CM | POA: Diagnosis not present

## 2023-01-09 DIAGNOSIS — A419 Sepsis, unspecified organism: Secondary | ICD-10-CM | POA: Diagnosis not present

## 2023-01-09 DIAGNOSIS — K219 Gastro-esophageal reflux disease without esophagitis: Secondary | ICD-10-CM | POA: Diagnosis not present

## 2023-01-09 DIAGNOSIS — N138 Other obstructive and reflux uropathy: Secondary | ICD-10-CM | POA: Diagnosis not present

## 2023-01-09 DIAGNOSIS — I4729 Other ventricular tachycardia: Secondary | ICD-10-CM | POA: Diagnosis not present

## 2023-01-09 DIAGNOSIS — F419 Anxiety disorder, unspecified: Secondary | ICD-10-CM | POA: Diagnosis not present

## 2023-01-09 DIAGNOSIS — F32A Depression, unspecified: Secondary | ICD-10-CM | POA: Diagnosis not present

## 2023-01-09 NOTE — Patient Outreach (Signed)
Care Coordination   Follow Up Visit Note   01/09/2023 Name: RIGGS DINEEN MRN: [id number] DOB: 1945/10/18  SHADEED COLBERG is a 77 y.o. year old male who sees [name], MD for primary care. I spoke with  [name] by phone today.  What matters to the patients health and wellness today?  Preventing urinary retention and hospitalization due to UTIs & sepsis   Goals Addressed             This Visit's Progress    Increase Physical Activity & Strength       Care Coordination Goals: Patient will continue to work with home health physical activity to increase strength Patient will increase physical activity level as tolerated with an ultimate goal of at least 150 minutes per week Patient will keep all medical appointments Patient will take medication as prescribed Patient will reach out to RN Care Coordinator [phone] with any resource or care coordination needs     Manage Blood Pressure       Care Coordination Goals: Patient will take medications as directed and report any negative side effects to provider  Patient will use a pill box/organizer to help keep up with when to take medications Patient will monitor and record blood pressure 3 times per week and as needed and will call PCP or specialist with any readings outside of recommended range Patient will keep all recommended follow-up appointments with PCP and specialists (cardiology, nephrology, etc) Patient will take blood pressure log to PCP and specialty appointments for review Patient will follow a low sodium/DASH diet  Patient will reach out to RN Care Coordinator [phone] with any care coordination or resource needs       Manage Urinary Retention and Avoid UTIs       Care Coordination Goals: Patient will take medications as prescribed Patient will follow-up with urologist as instructed Patient will verbalize understanding of UTI symptoms Patient will seek medical attention immediately for any urinary retention  symptoms or UTI symptoms Patient will talk with urologist more about prostate surgery Patient will verbalize understanding of UTI prevention Patient will reach out to RN Care Coordinator [phone] with any resource or care coordination needs        SDOH assessments and interventions completed:  Yes  SDOH Interventions Today    Flowsheet Row Most Recent Value  SDOH Interventions   Food Insecurity Interventions Intervention Not Indicated  Housing Interventions Intervention Not Indicated  Transportation Interventions Intervention Not Indicated  Financial Strain Interventions Intervention Not Indicated  Physical Activity Interventions Other (Comments)  [working with home health PT]        Care Coordination Interventions:  Yes, provided  Interventions Today    Flowsheet Row Most Recent Value  Chronic Disease   Chronic disease during today's visit Hypertension (HTN), Other  [Enlarged prostate, hx of urinary retention resulting in UTI and sepsis]  General Interventions   General Interventions Discussed/Reviewed General Interventions Discussed, General Interventions Reviewed, Labs, Doctor Visits, Durable Medical Equipment (DME)  Doctor Visits Discussed/Reviewed Doctor Visits Discussed, Specialist, Doctor Visits Reviewed, PCP  Durable Medical Equipment (DME) --  [blood pressure monitor]  PCP/Specialist Visits Compliance with follow-up visit  Exercise Interventions   Exercise Discussed/Reviewed Physical Activity, Exercise Reviewed, Exercise Discussed  Physical Activity Discussed/Reviewed Physical Activity Reviewed, Physical Activity Discussed  [working with home health PT]  Education Interventions   Provided Verbal Education On Nutrition, Mental Health/Coping with Illness, When to see the doctor, Exercise, Medication, Labs  [blood pressure monitoring]  Labs Reviewed --  [urine culture results, blood culture results, CMP, CBC]  Nutrition Interventions   Nutrition Discussed/Reviewed  Fluid intake  [drink plenty of water/liquids to flush bacteria for urinary system]  Pharmacy Interventions   Pharmacy Dicussed/Reviewed Medications and their functions, Pharmacy Topics Discussed, Pharmacy Topics Reviewed  [antibiotics, Proscar, flomax]  Safety Interventions   Safety Discussed/Reviewed Safety Discussed       Follow up plan: Follow up call scheduled for 5/15/524    Encounter Outcome:  Pt. Visit Completed   [name], BSN, RN-BC RN Care Coordinator [facility]  [facility] Direct Dial: [phone] Main #: [phone]

## 2023-01-10 LAB — CBC WITH DIFFERENTIAL
Basophils Absolute: 0 10*3/uL (ref 0.0–0.2)
Basos: 0 %
EOS (ABSOLUTE): 0.2 10*3/uL (ref 0.0–0.4)
Eos: 3 %
Hematocrit: 44.3 % (ref 37.5–51.0)
Hemoglobin: 15 g/dL (ref 13.0–17.7)
Immature Grans (Abs): 0 10*3/uL (ref 0.0–0.1)
Immature Granulocytes: 0 %
Lymphocytes Absolute: 0.9 10*3/uL (ref 0.7–3.1)
Lymphs: 16 %
MCH: 29.5 pg (ref 26.6–33.0)
MCHC: 33.9 g/dL (ref 31.5–35.7)
MCV: 87 fL (ref 79–97)
Monocytes Absolute: 0.4 10*3/uL (ref 0.1–0.9)
Monocytes: 7 %
Neutrophils Absolute: 3.9 10*3/uL (ref 1.4–7.0)
Neutrophils: 74 %
RBC: 5.08 x10E6/uL (ref 4.14–5.80)
RDW: 13 % (ref 11.6–15.4)
WBC: 5.4 10*3/uL (ref 3.4–10.8)

## 2023-01-10 LAB — BASIC METABOLIC PANEL
BUN/Creatinine Ratio: 15 (ref 10–24)
BUN: 14 mg/dL (ref 8–27)
CO2: 22 mmol/L (ref 20–29)
Calcium: 9.3 mg/dL (ref 8.6–10.2)
Chloride: 101 mmol/L (ref 96–106)
Creatinine, Ser: 0.93 mg/dL (ref 0.76–1.27)
Glucose: 120 mg/dL — ABNORMAL HIGH (ref 70–99)
Potassium: 4 mmol/L (ref 3.5–5.2)
Sodium: 140 mmol/L (ref 134–144)
eGFR: 86 mL/min/{1.73_m2} (ref >59)

## 2023-01-11 ENCOUNTER — Ambulatory Visit (HOSPITAL_COMMUNITY)
Admission: RE | Admit: 2023-01-11 | Discharge: 2023-01-11 | Disposition: A | Discharge status: Home / Self Care | Payer: Medicare PPO | Source: Ambulatory Visit | Attending: Pulmonary Disease | Admitting: Pulmonary Disease

## 2023-01-11 DIAGNOSIS — J439 Emphysema, unspecified: Secondary | ICD-10-CM | POA: Diagnosis not present

## 2023-01-11 DIAGNOSIS — R911 Solitary pulmonary nodule: Secondary | ICD-10-CM | POA: Diagnosis not present

## 2023-01-11 DIAGNOSIS — R918 Other nonspecific abnormal finding of lung field: Secondary | ICD-10-CM | POA: Diagnosis not present

## 2023-01-12 DIAGNOSIS — F32A Depression, unspecified: Secondary | ICD-10-CM | POA: Diagnosis not present

## 2023-01-12 DIAGNOSIS — N390 Urinary tract infection, site not specified: Secondary | ICD-10-CM | POA: Diagnosis not present

## 2023-01-12 DIAGNOSIS — A419 Sepsis, unspecified organism: Secondary | ICD-10-CM | POA: Diagnosis not present

## 2023-01-12 DIAGNOSIS — K219 Gastro-esophageal reflux disease without esophagitis: Secondary | ICD-10-CM | POA: Diagnosis not present

## 2023-01-12 DIAGNOSIS — I10 Essential (primary) hypertension: Secondary | ICD-10-CM | POA: Diagnosis not present

## 2023-01-12 DIAGNOSIS — I4729 Other ventricular tachycardia: Secondary | ICD-10-CM | POA: Diagnosis not present

## 2023-01-12 DIAGNOSIS — M170 Bilateral primary osteoarthritis of knee: Secondary | ICD-10-CM | POA: Diagnosis not present

## 2023-01-12 DIAGNOSIS — F419 Anxiety disorder, unspecified: Secondary | ICD-10-CM | POA: Diagnosis not present

## 2023-01-12 DIAGNOSIS — R339 Retention of urine, unspecified: Secondary | ICD-10-CM | POA: Diagnosis not present

## 2023-01-16 ENCOUNTER — Telehealth: Payer: Self-pay

## 2023-01-16 DIAGNOSIS — I4729 Other ventricular tachycardia: Secondary | ICD-10-CM | POA: Diagnosis not present

## 2023-01-16 DIAGNOSIS — F419 Anxiety disorder, unspecified: Secondary | ICD-10-CM | POA: Diagnosis not present

## 2023-01-16 DIAGNOSIS — R339 Retention of urine, unspecified: Secondary | ICD-10-CM | POA: Diagnosis not present

## 2023-01-16 DIAGNOSIS — M170 Bilateral primary osteoarthritis of knee: Secondary | ICD-10-CM | POA: Diagnosis not present

## 2023-01-16 DIAGNOSIS — F32A Depression, unspecified: Secondary | ICD-10-CM | POA: Diagnosis not present

## 2023-01-16 DIAGNOSIS — K219 Gastro-esophageal reflux disease without esophagitis: Secondary | ICD-10-CM | POA: Diagnosis not present

## 2023-01-16 DIAGNOSIS — I10 Essential (primary) hypertension: Secondary | ICD-10-CM | POA: Diagnosis not present

## 2023-01-16 DIAGNOSIS — A419 Sepsis, unspecified organism: Secondary | ICD-10-CM | POA: Diagnosis not present

## 2023-01-16 DIAGNOSIS — N390 Urinary tract infection, site not specified: Secondary | ICD-10-CM | POA: Diagnosis not present

## 2023-01-16 NOTE — Telephone Encounter (Signed)
I called pt to f/u on a triage call from 05/03.  He was complaining of burning with urination and discomfort after having catheter removed on 04/22.  Pt scheduled for [place] for ua/uc on 05/08.

## 2023-01-18 ENCOUNTER — Ambulatory Visit (INDEPENDENT_AMBULATORY_CARE_PROVIDER_SITE_OTHER): Payer: Medicare PPO

## 2023-01-18 ENCOUNTER — Telehealth: Payer: Self-pay

## 2023-01-18 DIAGNOSIS — N390 Urinary tract infection, site not specified: Secondary | ICD-10-CM

## 2023-01-18 DIAGNOSIS — R39198 Other difficulties with micturition: Secondary | ICD-10-CM | POA: Diagnosis not present

## 2023-01-18 DIAGNOSIS — R339 Retention of urine, unspecified: Secondary | ICD-10-CM

## 2023-01-18 LAB — URINALYSIS, ROUTINE W REFLEX MICROSCOPIC
Bilirubin, UA: NEGATIVE
Glucose, UA: NEGATIVE
Ketones, UA: NEGATIVE
Nitrite, UA: NEGATIVE
RBC, UA: NEGATIVE
Specific Gravity, UA: 1.015 (ref 1.005–1.030)
Urobilinogen, Ur: 0.2 mg/dL (ref 0.2–1.0)
pH, UA: 6.5 (ref 5.0–7.5)

## 2023-01-18 LAB — MICROSCOPIC EXAMINATION

## 2023-01-18 NOTE — Telephone Encounter (Signed)
Patient is aware of Dr. Ronne Binning recommendation that no treatment will be started at this time but urine has been sent foe culture and someone will reach out to patient giving him the MD recommendation. Patient voiced understanding

## 2023-01-18 NOTE — Progress Notes (Signed)
Patient presents today with complaints of  burning with urination. UA and Culture done today.  Dr. Ronne Binning reviewed results and No treatment.  Patient aware of MD recommendations and that we will reach out with culture results.      ZOXWRUEA, CMA

## 2023-01-19 ENCOUNTER — Encounter: Payer: Self-pay | Admitting: Pulmonary Disease

## 2023-01-19 ENCOUNTER — Ambulatory Visit: Payer: Medicare PPO | Admitting: Pulmonary Disease

## 2023-01-19 VITALS — BP 130/80 | HR 82 | Ht 77.0 in | Wt 207.4 lb

## 2023-01-19 DIAGNOSIS — K219 Gastro-esophageal reflux disease without esophagitis: Secondary | ICD-10-CM | POA: Diagnosis not present

## 2023-01-19 DIAGNOSIS — F32A Depression, unspecified: Secondary | ICD-10-CM | POA: Diagnosis not present

## 2023-01-19 DIAGNOSIS — F419 Anxiety disorder, unspecified: Secondary | ICD-10-CM | POA: Diagnosis not present

## 2023-01-19 DIAGNOSIS — Z789 Other specified health status: Secondary | ICD-10-CM | POA: Diagnosis not present

## 2023-01-19 DIAGNOSIS — A419 Sepsis, unspecified organism: Secondary | ICD-10-CM | POA: Diagnosis not present

## 2023-01-19 DIAGNOSIS — I10 Essential (primary) hypertension: Secondary | ICD-10-CM | POA: Diagnosis not present

## 2023-01-19 DIAGNOSIS — R911 Solitary pulmonary nodule: Secondary | ICD-10-CM | POA: Insufficient documentation

## 2023-01-19 DIAGNOSIS — N390 Urinary tract infection, site not specified: Secondary | ICD-10-CM | POA: Diagnosis not present

## 2023-01-19 DIAGNOSIS — I4729 Other ventricular tachycardia: Secondary | ICD-10-CM | POA: Diagnosis not present

## 2023-01-19 DIAGNOSIS — R339 Retention of urine, unspecified: Secondary | ICD-10-CM | POA: Diagnosis not present

## 2023-01-19 DIAGNOSIS — M170 Bilateral primary osteoarthritis of knee: Secondary | ICD-10-CM | POA: Diagnosis not present

## 2023-01-19 NOTE — Addendum Note (Signed)
Addended by: Hedda Slade on: 01/19/2023 04:00 PM   Modules accepted: Orders

## 2023-01-19 NOTE — Addendum Note (Signed)
Addended by: Hedda Slade on: 01/19/2023 04:16 PM   Modules accepted: Orders

## 2023-01-19 NOTE — H&P (View-Only) (Signed)
 Synopsis: Referred in May 2023 for lung nodule by Golding, John, MD  Subjective:   PATIENT ID: Micheal Baker GENDER: male DOB: 10/08/1945, MRN: [id number]  Chief Complaint  Patient presents with   Follow-up    F/up on CT scan    This is a 77-year-old gentleman past medical history of nonsustained VT, hypothyroidism, hypertension, PVCs.  Non-smoker his entire life.  He tried a few cigarettes when he was a teenager.  Never had any history of malignancy or solid organ tumor.  No family history of lung cancer.  Patient had a CT scan of the abdomen and pelvis completed 4 evaluation of possible kidney stone.  In the left lower lobe base of the lung was found to have a 5 mm peripheral lung nodule.  01/19/2023: Here today for follow-up after recent noncontrasted CT chest.  Patient has no history of malignancy we been following this pulmonary nodule in the lower lobe.  Follow-up CT imaging does reveal that the nodule has increased in size now 8 mm in size.  He also now has a little bit of a pleural tag concerning for potential underlying malignancy.  We have talked about this and reviewed images today in the office.    Past Medical History:  Diagnosis Date   Acute medial meniscus tear of left knee    Depression    Frequent PVCs    GERD (gastroesophageal reflux disease)    Hypertension    Hypothyroidism    NSVT (nonsustained ventricular tachycardia) (HCC)    Osteoarthritis    Thyroid disease      Family History  Problem Relation Age of Onset   Hypertension Mother    Hypertension Father    Heart attack Father    Diabetes Father    Asthma Daughter    Colon cancer Neg Hx    Stomach cancer Neg Hx      Past Surgical History:  Procedure Laterality Date   BIOPSY  12/01/2022   Procedure: BIOPSY;  Surgeon: Carver, Charles K, DO;  Location: [facility];  Service: Endoscopy;;   CATARACT EXTRACTION     left   COLONOSCOPY N/A 04/08/2016   Surgeon: Sandi L Fields, MD; one 6 mm polyp in the  cecum removed, one 4 mm polyp in the descending colon removed, nonbleeding internal hemorrhoids.  Pathology with hyperplastic polyps.  Recommended colonoscopy in 10 years.   COLONOSCOPY  12/01/2022   COLONOSCOPY WITH PROPOFOL N/A 12/01/2022   Procedure: COLONOSCOPY WITH PROPOFOL;  Surgeon: Carver, Charles K, DO;  Location: [facility];  Service: Endoscopy;  Laterality: N/A;  8:15 am, asa 3   endosocopy  12/01/2022   ESOPHAGOGASTRODUODENOSCOPY (EGD) WITH PROPOFOL N/A 12/01/2022   Procedure: ESOPHAGOGASTRODUODENOSCOPY (EGD) WITH PROPOFOL;  Surgeon: Carver, Charles K, DO;  Location: [facility];  Service: Endoscopy;  Laterality: N/A;   EYE SURGERY     left-scar tissue   groin surgery Right 2010   growth removed    KNEE ARTHROSCOPY     left   KNEE ARTHROSCOPY WITH LATERAL MENISECTOMY Right 03/11/2015   Procedure: RIGHT KNEE ARTHROSCOPY WITH MEDIALAND LATERAL MENISECTOMY;  Surgeon: Robert Wainer, MD;  Location: [facility];  Service: Orthopedics;  Laterality: Right;   KNEE ARTHROSCOPY WITH MEDIAL MENISECTOMY Left 06/27/2014   Procedure: LEFT KNEE ARTHROSCOPY WITH PARTIAL MEDIAL AND LATERAL MENISECTOMIES AND CHONDROPLASTY;  Surgeon: Robert A Wainer, MD;  Location: [facility];  Service: Orthopedics;  Laterality: Left;   KNEE ARTHROSCOPY WITH MEDIAL MENISECTOMY   Right 03/11/2015   Procedure: KNEE ARTHROSCOPY WITH MEDIAL MENISECTOMY;  Surgeon: Robert Wainer, MD;  Location: [facility];  Service: Orthopedics;  Laterality: Right;   LEFT HEART CATHETERIZATION WITH CORONARY ANGIOGRAM N/A 09/14/2011   Procedure: LEFT HEART CATHETERIZATION WITH CORONARY ANGIOGRAM;  Surgeon: Kenneth C. Hilty, MD;  Location: [facility];  Service: Cardiovascular;  Laterality: N/A;   POLYPECTOMY  04/08/2016   Procedure: POLYPECTOMY;  Surgeon: Sandi L Fields, MD;  Location: [facility];  Service: Endoscopy;;  colon    POLYPECTOMY  12/01/2022   POLYPECTOMY  12/01/2022   Procedure:  POLYPECTOMY;  Surgeon: Carver, Charles K, DO;  Location: [facility];  Service: Endoscopy;;   RETINAL DETACHMENT SURGERY Left 2002   Dr. 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  Lives alone in a one story home.  Has 2 children.  Retired from [employer].  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  Education: masters.     Social Determinants of Health   Financial Resource Strain: Low Risk  (01/09/2023)   Overall Financial Resource Strain (CARDIA)    Difficulty of Paying Living Expenses: Not very hard  Food Insecurity: No Food Insecurity (01/09/2023)   Hunger Vital Sign    Worried About Running Out of Food in the Last Year: Never true    Ran Out of Food in the Last Year: Never true  Transportation Needs: No Transportation Needs (01/09/2023)   PRAPARE - Transportation    Lack of Transportation (Medical): No    Lack of Transportation (Non-Medical): No  Physical Activity: Inactive (01/09/2023)   Exercise Vital Sign    Days of Exercise per Week: 0 days    Minutes of Exercise per Session: 0 min  Stress: Stress Concern Present (01/03/2023)   [employer] - Occupational Stress Questionnaire    Feeling of Stress :  Rather much  Social Connections: Not on file  Intimate Partner Violence: Not At Risk (12/25/2022)   Humiliation, Afraid, Rape, and Kick questionnaire    Fear of Current or Ex-Partner: No    Emotionally Abused: No    Physically Abused: No    Sexually Abused: No     Allergies  Allergen Reactions   Avelox [Moxifloxacin] Other (See Comments)    Caused tendonitis   Penicillins Other (See Comments)    Chills      Outpatient Medications Prior to Visit  Medication Sig Dispense Refill   acetaminophen (TYLENOL) 500 MG tablet Take 500-1,000 mg by mouth every 6 (six) hours as needed for mild pain, headache or moderate pain.       ALPHA LIPOIC ACID PO Take 1 capsule by mouth in the morning.     B Complex-C (B-COMPLEX WITH VITAMIN C) tablet Take 1 tablet by mouth in the morning.     calcium carbonate (TUMS - DOSED IN MG ELEMENTAL CALCIUM) 500 MG chewable tablet Chew 1 tablet (200 mg of elemental calcium total) by mouth 3 (three) times daily with meals. 90 tablet 4   diclofenac Sodium (VOLTAREN) 1 % GEL Apply 1 Application topically 4 (four) times daily as needed (pain.).     escitalopram (LEXAPRO) 20 MG tablet Take 20 mg by mouth daily.     finasteride (PROSCAR) 5 MG tablet Take 1 tablet (5 mg total) by mouth daily. 90 tablet 3   fluticasone (FLONASE) 50 MCG/ACT nasal spray Place 1 spray into both nostrils daily as needed for allergies.     levothyroxine (SYNTHROID, LEVOTHROID) 75 MCG tablet Take 75 mcg by mouth daily before breakfast.     pantoprazole (PROTONIX) 40 MG tablet Take 40 mg by mouth daily.     silodosin (RAPAFLO) 8 MG CAPS capsule Take 1 capsule (8 mg total) by mouth at bedtime. 30 capsule 11   No facility-administered medications prior to visit.    Review of Systems  Constitutional:  Negative for chills, fever, malaise/fatigue and weight loss.  HENT:  Negative for hearing loss, sore throat and tinnitus.   Eyes:  Negative for blurred vision and double vision.  Respiratory:   Negative for cough, hemoptysis, sputum production, shortness of breath, wheezing and stridor.   Cardiovascular:  Negative for chest pain, palpitations, orthopnea, leg swelling and PND.  Gastrointestinal:  Negative for abdominal pain, constipation, diarrhea, heartburn, nausea and vomiting.  Genitourinary:  Negative for dysuria, hematuria and urgency.  Musculoskeletal:  Negative for joint pain and myalgias.  Skin:  Negative for itching and rash.  Neurological:  Negative for dizziness, tingling, weakness and headaches.  Endo/Heme/Allergies:  Negative for environmental allergies. Does not bruise/bleed easily.  Psychiatric/Behavioral:  Negative for depression. The patient is not nervous/anxious and does not have insomnia.   All other systems reviewed and are negative.    Objective:  Physical Exam Vitals reviewed.  Constitutional:      General: He is not in acute distress.    Appearance: He is well-developed.  HENT:     Head: Normocephalic and atraumatic.  Eyes:     General: No scleral icterus.    Conjunctiva/sclera: Conjunctivae normal.     Pupils: Pupils are equal, round, and reactive to light.  Neck:     Vascular: No JVD.     Trachea: No tracheal deviation.  Cardiovascular:     Rate and Rhythm: Normal rate and regular rhythm.     Heart sounds: Normal heart sounds. No murmur heard. Pulmonary:     Effort: Pulmonary effort is normal. No tachypnea, accessory muscle usage or respiratory distress.     Breath sounds: No stridor. No wheezing, rhonchi or rales.  Abdominal:     General: There is no distension.     Palpations: Abdomen is soft.     Tenderness: There is no abdominal tenderness.  Musculoskeletal:        General: No tenderness.     Cervical back: Neck supple.  Lymphadenopathy:     Cervical: No cervical adenopathy.  Skin:    General: Skin is warm and dry.     Capillary Refill: Capillary refill takes less than 2 seconds.     Findings: No rash.  Neurological:     Mental    Status: He is alert and oriented to person, place, and time.  Psychiatric:        Behavior: Behavior normal.      Vitals:   01/19/23 1458  BP: 130/80  Pulse: 82  SpO2: 96%  Weight: 207 lb 6.4 oz (94.1 kg)  Height: 6' 5" (1.956 m)   96% on  RA BMI Readings from Last 3 Encounters:  01/19/23 24.59 kg/m  12/24/22 24.90 kg/m  12/08/22 24.90 kg/m   Wt Readings from Last 3 Encounters:  01/19/23 207 lb 6.4 oz (94.1 kg)  12/24/22 210 lb (95.3 kg)  12/08/22 210 lb (95.3 kg)     CBC    Component Value Date/Time   WBC 5.4 01/09/2023 0928   WBC 6.3 12/28/2022 0404   RBC 5.08 01/09/2023 0928   RBC 3.98 (L) 12/28/2022 0404   HGB 15.0 01/09/2023 0928   HCT 44.3 01/09/2023 0928   PLT 236 12/28/2022 0404   MCV 87 01/09/2023 0928   MCH 29.5 01/09/2023 0928   MCH 30.2 12/28/2022 0404   MCHC 33.9 01/09/2023 0928   MCHC 34.0 12/28/2022 0404   RDW 13.0 01/09/2023 0928   LYMPHSABS 0.9 01/09/2023 0928   MONOABS 0.6 12/28/2022 0404   EOSABS 0.2 01/09/2023 0928   BASOSABS 0.0 01/09/2023 0928     Chest Imaging: abdomen pelvis CT: Patient brought a CD with him to the office today for review. There was a 5 mm lower lobe nodule on the left side that was found incidentally. Also has a calcified nodule in the right lung. The patient's images have been independently reviewed by me.    Pulmonary Functions Testing Results:     No data to display          FeNO:   Pathology:   Echocardiogram:   Heart Catheterization:     Assessment & Plan:     ICD-10-CM   1. Lung nodule  R91.1 NM PET Image Initial (PI) Skull Base To Thigh (F-18 FDG)    Ambulatory referral to Pulmonology    Procedural/ Surgical Case Request: ROBOTIC ASSISTED NAVIGATIONAL BRONCHOSCOPY    2. Nonsmoker  Z78.9       Discussion:  This is a 76-year-old gentleman found with an incidental right lower lobe pulmonary nodule.  He is a non-smoker.  He was concern for restratification with low risk of  malignancy.  However with CT image follow-up at 1 year it showed enlargement of the nodule.  This is concerning for potential underlying primary bronchogenic carcinoma. He would like to consider biopsy before consideration for surgery.  Plan: We talked about all the various options today in the office to include CT-guided biopsy versus bronchoscopy versus direct referral for consideration of surgery.  Ugonna plan first with a robotic assisted navigation bronchoscopy tissue sampling to confirm diagnosis. Patient is agreeable to proceed with this.  We talked about the risk of bleeding and pneumothorax. Tentative bronchoscopy date will be on 02/07/2023. We will have a nuclear medicine pet imaging complete prior to this. Appreciate PCC's help with scheduling.    Current Outpatient Medications:    acetaminophen (TYLENOL) 500 MG tablet, Take 500-1,000 mg by mouth every 6 (six) hours as needed for mild pain, headache or moderate pain., Disp: , Rfl:    ALPHA LIPOIC ACID PO, Take 1 capsule by mouth in the morning., Disp: , Rfl:    B Complex-C (B-COMPLEX WITH VITAMIN C) tablet, Take 1 tablet by mouth in the morning., Disp: , Rfl:      calcium carbonate (TUMS - DOSED IN MG ELEMENTAL CALCIUM) 500 MG chewable tablet, Chew 1 tablet (200 mg of elemental calcium total) by mouth 3 (three) times daily with meals., Disp: 90 tablet, Rfl: 4   diclofenac Sodium (VOLTAREN) 1 % GEL, Apply 1 Application topically 4 (four) times daily as needed (pain.)., Disp: , Rfl:    escitalopram (LEXAPRO) 20 MG tablet, Take 20 mg by mouth daily., Disp: , Rfl:    finasteride (PROSCAR) 5 MG tablet, Take 1 tablet (5 mg total) by mouth daily., Disp: 90 tablet, Rfl: 3   fluticasone (FLONASE) 50 MCG/ACT nasal spray, Place 1 spray into both nostrils daily as needed for allergies., Disp: , Rfl:    levothyroxine (SYNTHROID, LEVOTHROID) 75 MCG tablet, Take 75 mcg by mouth daily before breakfast., Disp: , Rfl:    pantoprazole (PROTONIX) 40 MG  tablet, Take 40 mg by mouth daily., Disp: , Rfl:    silodosin (RAPAFLO) 8 MG CAPS capsule, Take 1 capsule (8 mg total) by mouth at bedtime., Disp: 30 capsule, Rfl: 11   I spent 42 minutes dedicated to the care of this patient on the date of this encounter to include pre-visit review of records, face-to-face time with the patient discussing conditions above, post visit ordering of testing, clinical documentation with the electronic health record, making appropriate referrals as documented, and communicating necessary findings to members of the patients care team.    Rayanne Padmanabhan L Adellyn Capek, DO [facility] 01/19/2023 3:30 PM    

## 2023-01-19 NOTE — Patient Instructions (Signed)
Thank you for visiting Dr. Tonia Brooms at [facility]. Today we recommend the following:  Orders Placed This Encounter  Procedures   Procedural/ Surgical Case Request: ROBOTIC ASSISTED NAVIGATIONAL BRONCHOSCOPY   NM PET Image Initial (PI) Skull Base To Thigh (F-18 FDG)   Ambulatory referral to Pulmonology   Bronchoscopy on 02/07/2023  Return in about 26 days (around 02/14/2023) for w/ sarah groce, NP, after Bronchoscopy.    Please do your part to reduce the spread of COVID-19.

## 2023-01-19 NOTE — Progress Notes (Signed)
Synopsis: Referred in May 2023 for lung nodule by [name], MD  Subjective:   PATIENT ID: Micheal Baker GENDER: male DOB: 06-20-1946, MRN: [id number]  Chief Complaint  Patient presents with   Follow-up    F/up on CT scan    This is a 77 year old gentleman past medical history of nonsustained VT, hypothyroidism, hypertension, PVCs.  Non-smoker his entire life.  He tried a few cigarettes when he was a teenager.  Never had any history of malignancy or solid organ tumor.  No family history of lung cancer.  Patient had a CT scan of the abdomen and pelvis completed 4 evaluation of possible kidney stone.  In the left lower lobe base of the lung was found to have a 5 mm peripheral lung nodule.  01/19/2023: Here today for follow-up after recent noncontrasted CT chest.  Patient has no history of malignancy we been following this pulmonary nodule in the lower lobe.  Follow-up CT imaging does reveal that the nodule has increased in size now 8 mm in size.  He also now has a little bit of a pleural tag concerning for potential underlying malignancy.  We have talked about this and reviewed images today in the office.    Past Medical History:  Diagnosis Date   Acute medial meniscus tear of left knee    Depression    Frequent PVCs    GERD (gastroesophageal reflux disease)    Hypertension    Hypothyroidism    NSVT (nonsustained ventricular tachycardia) (HCC)    Osteoarthritis    Thyroid disease      Family History  Problem Relation Age of Onset   Hypertension Mother    Hypertension Father    Heart attack Father    Diabetes Father    Asthma Daughter    Colon cancer Neg Hx    Stomach cancer Neg Hx      Past Surgical History:  Procedure Laterality Date   BIOPSY  12/01/2022   Procedure: BIOPSY;  Surgeon: Lanelle Bal, DO;  [facility];  Service: Endoscopy;;   CATARACT EXTRACTION     left   COLONOSCOPY N/A 04/08/2016   Surgeon: West Bali, MD; one 6 mm polyp in the  cecum removed, one 4 mm polyp in the descending colon removed, nonbleeding internal hemorrhoids.  Pathology with hyperplastic polyps.  Recommended colonoscopy in 10 years.   COLONOSCOPY  12/01/2022   COLONOSCOPY WITH PROPOFOL N/A 12/01/2022   Procedure: COLONOSCOPY WITH PROPOFOL;  Surgeon: Lanelle Bal, DO;  [facility];  Service: Endoscopy;  Laterality: N/A;  8:15 am, asa 3   endosocopy  12/01/2022   ESOPHAGOGASTRODUODENOSCOPY (EGD) WITH PROPOFOL N/A 12/01/2022   Procedure: ESOPHAGOGASTRODUODENOSCOPY (EGD) WITH PROPOFOL;  Surgeon: Lanelle Bal, DO;  [facility];  Service: Endoscopy;  Laterality: N/A;   EYE SURGERY     left-scar tissue   groin surgery Right 2010   growth removed    KNEE ARTHROSCOPY     left   KNEE ARTHROSCOPY WITH LATERAL MENISECTOMY Right 03/11/2015   Procedure: RIGHT KNEE ARTHROSCOPY WITH MEDIALAND LATERAL MENISECTOMY;  Surgeon: Salvatore Marvel, MD;  Location: [facility];  Service: Orthopedics;  Laterality: Right;   KNEE ARTHROSCOPY WITH MEDIAL MENISECTOMY Left 06/27/2014   Procedure: LEFT KNEE ARTHROSCOPY WITH PARTIAL MEDIAL AND LATERAL MENISECTOMIES AND CHONDROPLASTY;  Surgeon: Nilda Simmer, MD;  Location: [facility];  Service: Orthopedics;  Laterality: Left;   KNEE ARTHROSCOPY WITH MEDIAL MENISECTOMY  Right 03/11/2015   Procedure: KNEE ARTHROSCOPY WITH MEDIAL MENISECTOMY;  Surgeon: Salvatore Marvel, MD;  Location: [facility];  Service: Orthopedics;  Laterality: Right;   LEFT HEART CATHETERIZATION WITH CORONARY ANGIOGRAM N/A 09/14/2011   Procedure: LEFT HEART CATHETERIZATION WITH CORONARY ANGIOGRAM;  Surgeon: [name], MD;  Location: [facility] C[facility];  Service: Cardiovascular;  Laterality: N/A;   POLYPECTOMY  04/08/2016   Procedure: POLYPECTOMY;  Surgeon: West Bali, MD;  [facility];  Service: Endoscopy;;  colon    POLYPECTOMY  12/01/2022   POLYPECTOMY  12/01/2022   Procedure:  POLYPECTOMY;  Surgeon: Lanelle Bal, DO;  [facility];  Service: Endoscopy;;   RETINAL DETACHMENT SURGERY Left 2002   Dr. 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  Lives alone in a one story home.  Has 2 children.  Retired from [employer] system.  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  Education: Scientist, water quality.     Social Determinants of Health   Financial Resource Strain: Low Risk  (01/09/2023)   Overall Financial Resource Strain (CARDIA)    Difficulty of Paying Living Expenses: Not very hard  Food Insecurity: No Food Insecurity (01/09/2023)   Hunger Vital Sign    Worried About Running Out of Food in the Last Year: Never true    Ran Out of Food in the Last Year: Never true  Transportation Needs: No Transportation Needs (01/09/2023)   PRAPARE - Administrator, Civil Service (Medical): No    Lack of Transportation (Non-Medical): No  Physical Activity: Inactive (01/09/2023)   Exercise Vital Sign    Days of Exercise per Week: 0 days    Minutes of Exercise per Session: 0 min  Stress: Stress Concern Present (01/03/2023)   [employer] of Occupational Health - Occupational Stress Questionnaire    Feeling of Stress :  Rather much  Social Connections: Not on file  Intimate Partner Violence: Not At Risk (12/25/2022)   Humiliation, Afraid, Rape, and Kick questionnaire    Fear of Current or Ex-Partner: No    Emotionally Abused: No    Physically Abused: No    Sexually Abused: No     Allergies  Allergen Reactions   Avelox [Moxifloxacin] Other (See Comments)    Caused tendonitis   Penicillins Other (See Comments)    Chills      Outpatient Medications Prior to Visit  Medication Sig Dispense Refill   acetaminophen (TYLENOL) 500 MG tablet Take 500-1,000 mg by mouth every 6 (six) hours as needed for mild pain, headache or moderate pain.  ALPHA LIPOIC ACID PO Take 1 capsule by mouth in the morning.     B Complex-C (B-COMPLEX WITH VITAMIN C) tablet Take 1 tablet by mouth in the morning.     calcium carbonate (TUMS - DOSED IN MG ELEMENTAL CALCIUM) 500 MG chewable tablet Chew 1 tablet (200 mg of elemental calcium total) by mouth 3 (three) times daily with meals. 90 tablet 4   diclofenac Sodium (VOLTAREN) 1 % GEL Apply 1 Application topically 4 (four) times daily as needed (pain.).     escitalopram (LEXAPRO) 20 MG tablet Take 20 mg by mouth daily.     finasteride (PROSCAR) 5 MG tablet Take 1 tablet (5 mg total) by mouth daily. 90 tablet 3   fluticasone (FLONASE) 50 MCG/ACT nasal spray Place 1 spray into both nostrils daily as needed for allergies.     levothyroxine (SYNTHROID, LEVOTHROID) 75 MCG tablet Take 75 mcg by mouth daily before breakfast.     pantoprazole (PROTONIX) 40 MG tablet Take 40 mg by mouth daily.     silodosin (RAPAFLO) 8 MG CAPS capsule Take 1 capsule (8 mg total) by mouth at bedtime. 30 capsule 11   No facility-administered medications prior to visit.    Review of Systems  Constitutional:  Negative for chills, fever, malaise/fatigue and weight loss.  HENT:  Negative for hearing loss, sore throat and tinnitus.   Eyes:  Negative for blurred vision and double vision.  Respiratory:   Negative for cough, hemoptysis, sputum production, shortness of breath, wheezing and stridor.   Cardiovascular:  Negative for chest pain, palpitations, orthopnea, leg swelling and PND.  Gastrointestinal:  Negative for abdominal pain, constipation, diarrhea, heartburn, nausea and vomiting.  Genitourinary:  Negative for dysuria, hematuria and urgency.  Musculoskeletal:  Negative for joint pain and myalgias.  Skin:  Negative for itching and rash.  Neurological:  Negative for dizziness, tingling, weakness and headaches.  Endo/Heme/Allergies:  Negative for environmental allergies. Does not bruise/bleed easily.  Psychiatric/Behavioral:  Negative for depression. The patient is not nervous/anxious and does not have insomnia.   All other systems reviewed and are negative.    Objective:  Physical Exam Vitals reviewed.  Constitutional:      General: He is not in acute distress.    Appearance: He is well-developed.  HENT:     Head: Normocephalic and atraumatic.  Eyes:     General: No scleral icterus.    Conjunctiva/sclera: Conjunctivae normal.     Pupils: Pupils are equal, round, and reactive to light.  Neck:     Vascular: No JVD.     Trachea: No tracheal deviation.  Cardiovascular:     Rate and Rhythm: Normal rate and regular rhythm.     Heart sounds: Normal heart sounds. No murmur heard. Pulmonary:     Effort: Pulmonary effort is normal. No tachypnea, accessory muscle usage or respiratory distress.     Breath sounds: No stridor. No wheezing, rhonchi or rales.  Abdominal:     General: There is no distension.     Palpations: Abdomen is soft.     Tenderness: There is no abdominal tenderness.  Musculoskeletal:        General: No tenderness.     Cervical back: Neck supple.  Lymphadenopathy:     Cervical: No cervical adenopathy.  Skin:    General: Skin is warm and dry.     Capillary Refill: Capillary refill takes less than 2 seconds.     Findings: No rash.  Neurological:     Mental  Status: He is alert and oriented to person, place, and time.  Psychiatric:        Behavior: Behavior normal.      Vitals:   01/19/23 1458  BP: 130/80  Pulse: 82  SpO2: 96%  Weight: 207 lb 6.4 oz (94.1 kg)  Height: 6\' 5"  (1.956 m)   96% on  RA BMI Readings from Last 3 Encounters:  01/19/23 24.59 kg/m  12/24/22 24.90 kg/m  12/08/22 24.90 kg/m   Wt Readings from Last 3 Encounters:  01/19/23 207 lb 6.4 oz (94.1 kg)  12/24/22 210 lb (95.3 kg)  12/08/22 210 lb (95.3 kg)     CBC    Component Value Date/Time   WBC 5.4 01/09/2023 0928   WBC 6.3 12/28/2022 0404   RBC 5.08 01/09/2023 0928   RBC 3.98 (L) 12/28/2022 0404   HGB 15.0 01/09/2023 0928   HCT 44.3 01/09/2023 0928   PLT 236 12/28/2022 0404   MCV 87 01/09/2023 0928   MCH 29.5 01/09/2023 0928   MCH 30.2 12/28/2022 0404   MCHC 33.9 01/09/2023 0928   MCHC 34.0 12/28/2022 0404   RDW 13.0 01/09/2023 0928   LYMPHSABS 0.9 01/09/2023 0928   MONOABS 0.6 12/28/2022 0404   EOSABS 0.2 01/09/2023 0928   BASOSABS 0.0 01/09/2023 0928     Chest Imaging: abdomen pelvis CT: Patient brought a CD with him to the office today for review. There was a 5 mm lower lobe nodule on the left side that was found incidentally. Also has a calcified nodule in the right lung. The patient's images have been independently reviewed by me.    Pulmonary Functions Testing Results:     No data to display          FeNO:   Pathology:   Echocardiogram:   Heart Catheterization:     Assessment & Plan:     ICD-10-CM   1. Lung nodule  R91.1 NM PET Image Initial (PI) Skull Base To Thigh (F-18 FDG)    Ambulatory referral to Pulmonology    Procedural/ Surgical Case Request: ROBOTIC ASSISTED NAVIGATIONAL BRONCHOSCOPY    2. Nonsmoker  Z78.9       Discussion:  This is a 77 year old gentleman found with an incidental right lower lobe pulmonary nodule.  He is a non-smoker.  He was concern for restratification with low risk of  malignancy.  However with CT image follow-up at 1 year it showed enlargement of the nodule.  This is concerning for potential underlying primary bronchogenic carcinoma. He would like to consider biopsy before consideration for surgery.  Plan: We talked about all the various options today in the office to include CT-guided biopsy versus bronchoscopy versus direct referral for consideration of surgery.  Ugonna plan first with a robotic assisted navigation bronchoscopy tissue sampling to confirm diagnosis. Patient is agreeable to proceed with this.  We talked about the risk of bleeding and pneumothorax. Tentative bronchoscopy date will be on 02/07/2023. We will have a nuclear medicine pet imaging complete prior to this. Appreciate PCC's help with scheduling.    Current Outpatient Medications:    acetaminophen (TYLENOL) 500 MG tablet, Take 500-1,000 mg by mouth every 6 (six) hours as needed for mild pain, headache or moderate pain., Disp: , Rfl:    ALPHA LIPOIC ACID PO, Take 1 capsule by mouth in the morning., Disp: , Rfl:    B Complex-C (B-COMPLEX WITH VITAMIN C) tablet, Take 1 tablet by mouth in the morning., Disp: , Rfl:  calcium carbonate (TUMS - DOSED IN MG ELEMENTAL CALCIUM) 500 MG chewable tablet, Chew 1 tablet (200 mg of elemental calcium total) by mouth 3 (three) times daily with meals., Disp: 90 tablet, Rfl: 4   diclofenac Sodium (VOLTAREN) 1 % GEL, Apply 1 Application topically 4 (four) times daily as needed (pain.)., Disp: , Rfl:    escitalopram (LEXAPRO) 20 MG tablet, Take 20 mg by mouth daily., Disp: , Rfl:    finasteride (PROSCAR) 5 MG tablet, Take 1 tablet (5 mg total) by mouth daily., Disp: 90 tablet, Rfl: 3   fluticasone (FLONASE) 50 MCG/ACT nasal spray, Place 1 spray into both nostrils daily as needed for allergies., Disp: , Rfl:    levothyroxine (SYNTHROID, LEVOTHROID) 75 MCG tablet, Take 75 mcg by mouth daily before breakfast., Disp: , Rfl:    pantoprazole (PROTONIX) 40 MG  tablet, Take 40 mg by mouth daily., Disp: , Rfl:    silodosin (RAPAFLO) 8 MG CAPS capsule, Take 1 capsule (8 mg total) by mouth at bedtime., Disp: 30 capsule, Rfl: 11   I spent 42 minutes dedicated to the care of this patient on the date of this encounter to include pre-visit review of records, face-to-face time with the patient discussing conditions above, post visit ordering of testing, clinical documentation with the electronic health record, making appropriate referrals as documented, and communicating necessary findings to members of the patients care team.    Josephine Igo, DO [facility] 01/19/2023 3:30 PM

## 2023-01-20 LAB — URINE CULTURE: Organism ID, Bacteria: NO GROWTH

## 2023-01-23 DIAGNOSIS — A419 Sepsis, unspecified organism: Secondary | ICD-10-CM | POA: Diagnosis not present

## 2023-01-23 DIAGNOSIS — F32A Depression, unspecified: Secondary | ICD-10-CM | POA: Diagnosis not present

## 2023-01-23 DIAGNOSIS — I10 Essential (primary) hypertension: Secondary | ICD-10-CM | POA: Diagnosis not present

## 2023-01-23 DIAGNOSIS — M170 Bilateral primary osteoarthritis of knee: Secondary | ICD-10-CM | POA: Diagnosis not present

## 2023-01-23 DIAGNOSIS — F419 Anxiety disorder, unspecified: Secondary | ICD-10-CM | POA: Diagnosis not present

## 2023-01-23 DIAGNOSIS — R339 Retention of urine, unspecified: Secondary | ICD-10-CM | POA: Diagnosis not present

## 2023-01-23 DIAGNOSIS — I4729 Other ventricular tachycardia: Secondary | ICD-10-CM | POA: Diagnosis not present

## 2023-01-23 DIAGNOSIS — N390 Urinary tract infection, site not specified: Secondary | ICD-10-CM | POA: Diagnosis not present

## 2023-01-23 DIAGNOSIS — K219 Gastro-esophageal reflux disease without esophagitis: Secondary | ICD-10-CM | POA: Diagnosis not present

## 2023-01-25 ENCOUNTER — Telehealth: Payer: Self-pay

## 2023-01-25 ENCOUNTER — Ambulatory Visit: Payer: Self-pay | Admitting: *Deleted

## 2023-01-25 DIAGNOSIS — R339 Retention of urine, unspecified: Secondary | ICD-10-CM | POA: Diagnosis not present

## 2023-01-25 DIAGNOSIS — A419 Sepsis, unspecified organism: Secondary | ICD-10-CM | POA: Diagnosis not present

## 2023-01-25 DIAGNOSIS — I4729 Other ventricular tachycardia: Secondary | ICD-10-CM | POA: Diagnosis not present

## 2023-01-25 DIAGNOSIS — K219 Gastro-esophageal reflux disease without esophagitis: Secondary | ICD-10-CM | POA: Diagnosis not present

## 2023-01-25 DIAGNOSIS — M170 Bilateral primary osteoarthritis of knee: Secondary | ICD-10-CM | POA: Diagnosis not present

## 2023-01-25 DIAGNOSIS — F419 Anxiety disorder, unspecified: Secondary | ICD-10-CM | POA: Diagnosis not present

## 2023-01-25 DIAGNOSIS — F32A Depression, unspecified: Secondary | ICD-10-CM | POA: Diagnosis not present

## 2023-01-25 DIAGNOSIS — N390 Urinary tract infection, site not specified: Secondary | ICD-10-CM | POA: Diagnosis not present

## 2023-01-25 DIAGNOSIS — I10 Essential (primary) hypertension: Secondary | ICD-10-CM | POA: Diagnosis not present

## 2023-01-25 NOTE — Telephone Encounter (Signed)
Patient states he is having a biopsy on his lung on 05/28 and the pulmonologist had mentioned that the anesthesia could cause urinary retention.  Patient is asking if Dr. Ronne Binning recommends a medication to prevent this prior to his procedure.  Verbal from Dr. Ronne Binning that he does not recommend rx'ing anything at this time, the patient is already taking Rapaflo.  Dr. Ronne Binning gave verbal to inform patient if he has retention after his procedure to increase Rapaflo to BID, one in the am and one in the pm.  Patient aware of MD response and voiced understanding.

## 2023-01-25 NOTE — Telephone Encounter (Signed)
Patient called and states that he is having a biopsy of a spot on his lung.  Pulmonary said anesthesia can cause urinary issues, he is asking if you recommend a rx prior to the procedure?  Please advise

## 2023-01-26 ENCOUNTER — Encounter: Payer: Self-pay | Admitting: *Deleted

## 2023-01-26 NOTE — Patient Outreach (Signed)
Care Coordination   Follow Up Visit Note   01/25/2023 Name: Micheal Baker MRN: [id number] DOB: 1946/06/18  Micheal Baker is a 77 y.o. year old male who sees [name], MD for primary care. I spoke with  [name] by phone today.  What matters to the patients health and wellness today?  Having biopsy on lung nodule, preventing UTI due to potential urinary retention after anesthesia and catheter use    Goals Addressed             This Visit's Progress    Follow-up on Lung Nodule       Care Coordination Goals: Patient will keep all scheduled medical and imaging appointments Patient will follow-up with pulmonologist as recommended Patient will state understanding of current imaging results and upcoming procedure for a lung biopsy Patient will talk with [facility] re: any care coordination or resource needs [phone]      Increase Physical Activity & Strength   On track    Care Coordination Goals: Patient will continue to perform exercises provided by home health PT PT ends 01/25/23 Patient will increase physical activity level as tolerated with an ultimate goal of at least 150 minutes per week Patient will keep all medical appointments Patient will take medication as prescribed Patient will reach out to RN Care Coordinator [phone] with any resource or care coordination needs     Manage Blood Pressure   On track    Care Coordination Goals: Patient will take medications as directed and report any negative side effects to provider  Patient will use a pill box/organizer to help keep up with when to take medications Patient will monitor and record blood pressure 3 times per week and as needed and will call PCP or specialist with any readings outside of recommended range Patient will keep all recommended follow-up appointments with PCP and specialists (cardiology, nephrology, etc) Patient will take blood pressure log to PCP and specialty appointments for review Patient will  follow a low sodium/DASH diet  Patient will reach out to RN Care Coordinator [phone] with any care coordination or resource needs       Manage Urinary Retention and Avoid UTIs   On track    Care Coordination Goals: Patient will take medications as prescribed Patient will follow-up with urologist as instructed Patient will seek medical attention immediately for any urinary retention symptoms or UTI symptoms Patient will talk with urologist more about prostate surgery Patient will verbalize understanding of UTI prevention Patient will discuss concerns about urinary retention after upcoming procedure with anesthesia and about concern for development of UTI after catheter use during that procedure.  Patient has telephone call into Dr Dimas Millin office and [facility] sent staff message directly to Dr Ronne Binning about patient concerns [facility] will also ask pulmonologist performing procedure if a condom catheter is a possibility instead of an indwelling  Patient will reach out to RN Care Coordinator [phone] with any resource or care coordination needs        SDOH assessments and interventions completed:  Yes  SDOH Interventions Today    Flowsheet Row Most Recent Value  SDOH Interventions   Transportation Interventions Intervention Not Indicated  Financial Strain Interventions Intervention Not Indicated        Care Coordination Interventions:  Yes, provided  Interventions Today    Flowsheet Row Most Recent Value  Chronic Disease   Chronic disease during today's visit Hypertension (HTN), Other  [lung nodule, BPH]  General Interventions   General Interventions  Discussed/Reviewed General Interventions Discussed, General Interventions Reviewed, Labs, Durable Medical Equipment (DME), Doctor Visits, Communication with  [imaging results]  Doctor Visits Discussed/Reviewed Doctor Visits Discussed, Specialist, Doctor Visits Reviewed, PCP  [lung biopsy scheduled for next week]  Durable Medical  Equipment (DME) --  [blood pressure monitor]  PCP/Specialist Visits Compliance with follow-up visit  Communication with PCP/Specialists  [staff message to Dr Ronne Binning re: patient's concerns over recent UTI and sepsis s/p anesthesia resulting in urinary retention and foley catheter insertion. Asked if propyhylactic antibiotics would be approprite before/after upcoming procedure]  Exercise Interventions   Exercise Discussed/Reviewed Physical Activity  Physical Activity Discussed/Reviewed Physical Activity Discussed, Physical Activity Reviewed  [home health PT ends 01/25/23]  Education Interventions   Education Provided Provided Printed Education, Provided Web-based Education  Provided Verbal Education On When to see the doctor, Mental Health/Coping with Illness, Exercise, Medication, Other  [UTI prevention]  Mental Health Interventions   Mental Health Discussed/Reviewed Coping Strategies, Mental Health Discussed, Mental Health Reviewed  [potential cancer diagnosis. Bx pending. Risk for another UTI with anesthesia and catheter use]  Nutrition Interventions   Nutrition Discussed/Reviewed Fluid intake, Nutrition Reviewed, Nutrition Discussed  Pharmacy Interventions   Pharmacy Dicussed/Reviewed Pharmacy Topics Discussed, Pharmacy Topics Reviewed, Medications and their functions  Safety Interventions   Safety Discussed/Reviewed Safety Discussed, Safety Reviewed       Follow up plan: Follow up call scheduled for 02/02/23   5 Encounter Outcome:  Pt. Visit Completed   [name], BSN, RN-BC RN Care Coordinator [facility]  [facility] Direct Dial: [phone] Main #: [phone]

## 2023-02-01 ENCOUNTER — Other Ambulatory Visit: Payer: Self-pay

## 2023-02-01 ENCOUNTER — Encounter (HOSPITAL_COMMUNITY): Payer: Self-pay | Admitting: Pulmonary Disease

## 2023-02-01 DIAGNOSIS — F320 Major depressive disorder, single episode, mild: Secondary | ICD-10-CM | POA: Diagnosis not present

## 2023-02-01 DIAGNOSIS — Z6825 Body mass index (BMI) 25.0-25.9, adult: Secondary | ICD-10-CM | POA: Diagnosis not present

## 2023-02-01 NOTE — Progress Notes (Signed)
PCP - [name], MD  Cardiologist - denies  PPM/ICD - denies  EKG - 02/07/23 ECHO - 05/20/14 Cardiac Cath - 09/14/11  CPAP - yes - 5  Fasting Blood Sugar - n/a  Blood Thinner Instructions: n/a Patient was instructed: As of today, STOP taking any Aspirin (unless otherwise instructed by your surgeon) Aleve, Naproxen, Ibuprofen, Motrin, Advil, Goody's, BC's, all herbal medications, fish oil, and all vitamins.  ERAS Protcol - n/a  COVID TEST- yes - 02/03/23 per MD order  Anesthesia review: yes  Patient verbally denies any shortness of breath, fever, cough and chest pain during phone call   -------------  SDW INSTRUCTIONS given:  Your procedure is scheduled on Tuesday, May 28th, 2024.  Report to [facility] Main Entrance "A" at 06:45 A.M., and check in at the Admitting office.  Call this number if you have problems the morning of surgery:  [phone]   Remember:  Do not eat or drink after midnight the night before your surgery    Take these medicines the morning of surgery with A SIP OF WATER:  Lexapro, Proscar, Synthroid, Protonix,  PRN: Tylenol, Flonase, eye drops   The day of surgery:                     Do not wear jewelry,             Do not wear lotions, powders, colognes, or deodorant.            Men may shave face and neck.            Do not bring valuables to the hospital.            [facility] is not responsible for any belongings or valuables.  Do NOT Smoke (Tobacco/Vaping) 24 hours prior to your procedure If you use a CPAP at night, you may bring all equipment for your overnight stay.   Contacts, glasses, dentures or bridgework may not be worn into surgery.      For patients admitted to the hospital, discharge time will be determined by your treatment team.   Patients discharged the day of surgery will not be allowed to drive home, and someone needs to stay with them for 24 hours.    Special instructions:   [facility]- Preparing For  Surgery  Before surgery, you can play an important role. Because skin is not sterile, your skin needs to be as free of germs as possible. You can reduce the number of germs on your skin by washing with CHG (chlorahexidine gluconate) Soap before surgery.  CHG is an antiseptic cleaner which kills germs and bonds with the skin to continue killing germs even after washing.    Oral Hygiene is also important to reduce your risk of infection.  Remember - BRUSH YOUR TEETH THE MORNING OF SURGERY WITH YOUR REGULAR TOOTHPASTE  Please do not use if you have an allergy to CHG or antibacterial soaps. If your skin becomes reddened/irritated stop using the CHG.  Do not shave (including legs and underarms) for at least 48 hours prior to first CHG shower. It is OK to shave your face.  Please follow these instructions carefully.   Shower the NIGHT BEFORE SURGERY and the MORNING OF SURGERY with DIAL Soap.   Pat yourself dry with a CLEAN TOWEL.  Wear CLEAN PAJAMAS to bed the night before surgery  Place CLEAN SHEETS on your bed the night of your first shower and DO NOT SLEEP  WITH PETS.   Day of Surgery: Please shower morning of surgery  Wear Clean/Comfortable clothing the morning of surgery Do not apply any deodorants/lotions.   Remember to brush your teeth WITH YOUR REGULAR TOOTHPASTE.   Questions were answered. Patient verbalized understanding of instructions.

## 2023-02-02 ENCOUNTER — Ambulatory Visit: Payer: Self-pay | Admitting: *Deleted

## 2023-02-02 ENCOUNTER — Encounter: Payer: Self-pay | Admitting: *Deleted

## 2023-02-02 ENCOUNTER — Encounter (HOSPITAL_COMMUNITY)
Admission: RE | Admit: 2023-02-02 | Discharge: 2023-02-02 | Disposition: A | Discharge status: Home / Self Care | Payer: Medicare PPO | Source: Ambulatory Visit | Attending: Pulmonary Disease | Admitting: Pulmonary Disease

## 2023-02-02 DIAGNOSIS — R911 Solitary pulmonary nodule: Secondary | ICD-10-CM | POA: Diagnosis not present

## 2023-02-02 MED ORDER — FLUDEOXYGLUCOSE F - 18 (FDG) INJECTION
11.6100 | Freq: Once | INTRAVENOUS | Status: AC | PRN
  Administered 2023-02-02: 11.61 via INTRAVENOUS

## 2023-02-02 NOTE — Patient Outreach (Signed)
  Care Coordination   Follow Up Visit Note   02/02/2023 Name: Micheal Baker MRN: [id number] DOB: June 19, 1946  Micheal Baker is a 77 y.o. year old male who sees [name], MD for primary care. I spoke with  [name] by phone today.  What matters to the patients health and wellness today?  Decreasing risk for urinary retention and UTI development after upcoming procedure with anesthesia    Goals Addressed             This Visit's Progress    Manage Urinary Retention and Avoid UTIs   On track    Care Coordination Goals: Patient will take medications as prescribed Patient will follow-up with urologist as instructed Patient will seek medical attention immediately for any urinary retention symptoms or UTI symptoms Patient will talk with urologist more about prostate surgery Patient will verbalize understanding of UTI prevention Patient will discuss concerns about urinary retention after upcoming procedure with anesthesia and about concern for development of UTI after catheter use during that procedure.  Per Dr Ronne Binning, patient is to increase Rapaflo to BID 3 days before and 1 week after procedure with anesthesia to relax bladder, decrease urinary retention, and ultimately decrease risk for UTI Monitor blood pressure as it can cause a drop in BP Patient will reach out to RN Care Coordinator [phone] with any resource or care coordination needs        SDOH assessments and interventions completed:  Yes  SDOH Interventions Today    Flowsheet Row Most Recent Value  SDOH Interventions   Housing Interventions Intervention Not Indicated  Transportation Interventions Intervention Not Indicated        Care Coordination Interventions:  Yes, provided  Interventions Today    Flowsheet Row Most Recent Value  Chronic Disease   Chronic disease during today's visit Other  [BPH and hx of urinary retention s/p anesthesia]  General Interventions   General Interventions  Discussed/Reviewed General Interventions Discussed, General Interventions Reviewed, Doctor Visits, Durable Medical Equipment (DME)  Doctor Visits Discussed/Reviewed Doctor Visits Discussed, Doctor Visits Reviewed, PCP, Specialist  Durable Medical Equipment (DME) BP Cuff  PCP/Specialist Visits Compliance with follow-up visit  Communication with PCP/Specialists  [Dr McKenzie re: urinary retention with anesthesia. Dr recommended increasing rapaflo to BID 3 days prior to procedure and to continue for a week afterwards]  Education Interventions   Education Provided Provided Education  Provided Verbal Education On Medication, When to see the doctor, Mental Health/Coping with Illness  Pharmacy Interventions   Pharmacy Dicussed/Reviewed Pharmacy Topics Discussed, Medications and their functions, Pharmacy Topics Reviewed  [How rapaflo works]  Safety Interventions   Safety Discussed/Reviewed Safety Discussed, Safety Reviewed       Follow up plan: Follow up call scheduled for 02/03/23    Encounter Outcome:  Pt. Visit Completed   [name], BSN, RN-BC RN Care Coordinator [facility]  [facility] Direct Dial: [phone] Main #: [phone]

## 2023-02-03 ENCOUNTER — Other Ambulatory Visit: Payer: Medicare PPO

## 2023-02-03 ENCOUNTER — Ambulatory Visit: Payer: Self-pay | Admitting: *Deleted

## 2023-02-03 ENCOUNTER — Encounter: Payer: Self-pay | Admitting: *Deleted

## 2023-02-03 DIAGNOSIS — R911 Solitary pulmonary nodule: Secondary | ICD-10-CM | POA: Diagnosis not present

## 2023-02-05 LAB — NOVEL CORONAVIRUS, NAA: SARS-CoV-2, NAA: NOT DETECTED

## 2023-02-07 ENCOUNTER — Encounter (HOSPITAL_COMMUNITY)
Admission: RE | Disposition: A | Discharge status: Home / Self Care | Payer: Self-pay | Source: Home / Self Care | Attending: Pulmonary Disease

## 2023-02-07 ENCOUNTER — Ambulatory Visit (HOSPITAL_COMMUNITY): Payer: Medicare PPO | Admitting: Physician Assistant

## 2023-02-07 ENCOUNTER — Other Ambulatory Visit: Payer: Self-pay

## 2023-02-07 ENCOUNTER — Ambulatory Visit (HOSPITAL_COMMUNITY)
Admission: RE | Admit: 2023-02-07 | Discharge: 2023-02-07 | Disposition: A | Discharge status: Home / Self Care | Payer: Medicare PPO | Attending: Pulmonary Disease | Admitting: Pulmonary Disease

## 2023-02-07 ENCOUNTER — Ambulatory Visit (HOSPITAL_BASED_OUTPATIENT_CLINIC_OR_DEPARTMENT_OTHER): Payer: Medicare PPO | Admitting: Physician Assistant

## 2023-02-07 ENCOUNTER — Ambulatory Visit (HOSPITAL_COMMUNITY): Payer: Medicare PPO

## 2023-02-07 ENCOUNTER — Encounter (HOSPITAL_COMMUNITY): Payer: Self-pay | Admitting: Pulmonary Disease

## 2023-02-07 DIAGNOSIS — Z0389 Encounter for observation for other suspected diseases and conditions ruled out: Secondary | ICD-10-CM | POA: Diagnosis not present

## 2023-02-07 DIAGNOSIS — K219 Gastro-esophageal reflux disease without esophagitis: Secondary | ICD-10-CM | POA: Diagnosis not present

## 2023-02-07 DIAGNOSIS — Z79899 Other long term (current) drug therapy: Secondary | ICD-10-CM | POA: Insufficient documentation

## 2023-02-07 DIAGNOSIS — E039 Hypothyroidism, unspecified: Secondary | ICD-10-CM | POA: Diagnosis not present

## 2023-02-07 DIAGNOSIS — R911 Solitary pulmonary nodule: Secondary | ICD-10-CM | POA: Diagnosis not present

## 2023-02-07 DIAGNOSIS — C3432 Malignant neoplasm of lower lobe, left bronchus or lung: Secondary | ICD-10-CM | POA: Diagnosis not present

## 2023-02-07 DIAGNOSIS — F418 Other specified anxiety disorders: Secondary | ICD-10-CM

## 2023-02-07 DIAGNOSIS — I10 Essential (primary) hypertension: Secondary | ICD-10-CM

## 2023-02-07 HISTORY — DX: Sleep apnea, unspecified: G47.30

## 2023-02-07 HISTORY — DX: Personal history of urinary calculi: Z87.442

## 2023-02-07 HISTORY — PX: FIDUCIAL MARKER PLACEMENT: SHX6858

## 2023-02-07 HISTORY — PX: BRONCHIAL BIOPSY: SHX5109

## 2023-02-07 HISTORY — PX: BRONCHIAL NEEDLE ASPIRATION BIOPSY: SHX5106

## 2023-02-07 SURGERY — BRONCHOSCOPY, WITH BIOPSY USING ELECTROMAGNETIC NAVIGATION
Anesthesia: General | Laterality: Left

## 2023-02-07 MED ORDER — LIDOCAINE 2% (20 MG/ML) 5 ML SYRINGE
INTRAMUSCULAR | Status: DC | PRN
  Administered 2023-02-07: 60 mg via INTRAVENOUS
  Administered 2023-02-07: 40 mg via INTRAVENOUS

## 2023-02-07 MED ORDER — PROPOFOL 500 MG/50ML IV EMUL
INTRAVENOUS | Status: DC | PRN
  Administered 2023-02-07: 100 ug/kg/min via INTRAVENOUS

## 2023-02-07 MED ORDER — FENTANYL CITRATE (PF) 100 MCG/2ML IJ SOLN
INTRAMUSCULAR | Status: AC
  Filled 2023-02-07: qty 2

## 2023-02-07 MED ORDER — ONDANSETRON HCL 4 MG/2ML IJ SOLN
INTRAMUSCULAR | Status: DC | PRN
  Administered 2023-02-07: 4 mg via INTRAVENOUS

## 2023-02-07 MED ORDER — LACTATED RINGERS IV SOLN: INTRAVENOUS | Status: DC

## 2023-02-07 MED ORDER — DEXAMETHASONE SODIUM PHOSPHATE 10 MG/ML IJ SOLN
INTRAMUSCULAR | Status: DC | PRN
  Administered 2023-02-07: 10 mg via INTRAVENOUS

## 2023-02-07 MED ORDER — CHLORHEXIDINE GLUCONATE 0.12 % MT SOLN
OROMUCOSAL | Status: AC
  Administered 2023-02-07: 15 mL via OROMUCOSAL
  Filled 2023-02-07: qty 15

## 2023-02-07 MED ORDER — ROCURONIUM BROMIDE 10 MG/ML (PF) SYRINGE
PREFILLED_SYRINGE | INTRAVENOUS | Status: DC | PRN
  Administered 2023-02-07 (×2): 50 mg via INTRAVENOUS

## 2023-02-07 MED ORDER — PHENYLEPHRINE HCL-NACL 20-0.9 MG/250ML-% IV SOLN
INTRAVENOUS | Status: DC | PRN
  Administered 2023-02-07: 100 ug/min via INTRAVENOUS

## 2023-02-07 MED ORDER — CHLORHEXIDINE GLUCONATE 0.12 % MT SOLN: 15.0000 mL | Freq: Once | OROMUCOSAL | Status: AC

## 2023-02-07 MED ORDER — PROPOFOL 10 MG/ML IV BOLUS
INTRAVENOUS | Status: DC | PRN
  Administered 2023-02-07: 150 mg via INTRAVENOUS
  Administered 2023-02-07: 20 mg via INTRAVENOUS

## 2023-02-07 SURGICAL SUPPLY — 1 items: superlock fiducial IMPLANT

## 2023-02-07 NOTE — Transfer of Care (Signed)
Immediate Anesthesia Transfer of Care Note  Patient: Micheal Baker  Procedure(s) Performed: ROBOTIC ASSISTED NAVIGATIONAL BRONCHOSCOPY (Left) BRONCHIAL NEEDLE ASPIRATION BIOPSIES BRONCHIAL BIOPSIES FIDUCIAL MARKER PLACEMENT  Patient Location: PACU  Anesthesia Type:General  Level of Consciousness: awake, alert , and oriented  Airway & Oxygen Therapy: Patient Spontanous Breathing and Patient connected to nasal cannula oxygen  Post-op Assessment: Report given to RN and Post -op Vital signs reviewed and stable  Post vital signs: Reviewed and stable  Last Vitals:  Vitals Value Taken Time  BP 115/91 02/07/23 1100  Temp 36.4 C 02/07/23 1057  Pulse 61 02/07/23 1102  Resp 10 02/07/23 1102  SpO2 100 % 02/07/23 1102  Vitals shown include unvalidated device data.  Last Pain:  Vitals:   02/07/23 0736  TempSrc:   PainSc: 0-No pain      Patients Stated Pain Goal: 0 (02/07/23 0736)  Complications: No notable events documented.

## 2023-02-07 NOTE — Interval H&P Note (Signed)
History and Physical Interval Note:  02/07/2023 9:00 AM  Micheal Baker  has presented today for surgery, with the diagnosis of lung  nodule.  The various methods of treatment have been discussed with the patient and family. After consideration of risks, benefits and other options for treatment, the patient has consented to  Procedure(s): ROBOTIC ASSISTED NAVIGATIONAL BRONCHOSCOPY (Left) as a surgical intervention.  The patient's history has been reviewed, patient examined, no change in status, stable for surgery.  I have reviewed the patient's chart and labs.  Questions were answered to the patient's satisfaction.     Rachel Bo [name]

## 2023-02-07 NOTE — Discharge Instructions (Signed)

## 2023-02-07 NOTE — Op Note (Signed)
Video Bronchoscopy with Robotic Assisted Bronchoscopic Navigation   Date of Operation: 02/07/2023   Pre-op Diagnosis: lung nodule  Post-op Diagnosis: lung nodule   Surgeon: Josephine Igo, DO   Assistants: None   Anesthesia: General endotracheal anesthesia  Operation: Flexible video fiberoptic bronchoscopy with robotic assistance and biopsies.  Estimated Blood Loss: Minimal  Complications: None  Indications and History: Micheal Baker is a 77 y.o. male with history of lung nodule. The risks, benefits, complications, treatment options and expected outcomes were discussed with the patient.  The possibilities of pneumothorax, pneumonia, reaction to medication, pulmonary aspiration, perforation of a viscus, bleeding, failure to diagnose a condition and creating a complication requiring transfusion or operation were discussed with the patient who freely signed the consent.    Description of Procedure: The patient was seen in the Preoperative Area, was examined and was deemed appropriate to proceed.  The patient was taken to [facility] endoscopy room 3, identified as Micheal Baker and the procedure verified as Flexible Video Fiberoptic Bronchoscopy.  A Time Out was held and the above information confirmed.   Prior to the date of the procedure a high-resolution CT scan of the chest was performed. Utilizing ION software program a virtual tracheobronchial tree was generated to allow the creation of distinct navigation pathways to the patient's parenchymal abnormalities. After being taken to the operating room general anesthesia was initiated and the patient  was orally intubated. The video fiberoptic bronchoscope was introduced via the endotracheal tube and a general inspection was performed which showed normal right and left lung anatomy, aspiration of the bilateral mainstems was completed to remove any remaining secretions. Robotic catheter inserted into patient's endotracheal tube.   Target #1 LLL: The  distinct navigation pathways prepared prior to this procedure were then utilized to navigate to patient's lesion identified on CT scan. The robotic catheter was secured into place and the vision probe was withdrawn.  Lesion location was approximated using fluoroscopy and 3D CBCT for CT guided needle placement for peripheral targeting. Under fluoroscopic guidance transbronchial needle biopsies, and transbronchial forceps biopsies were performed to be sent for cytology and pathology. Following tissue sampling a single fiducial was placed within proximity of the lesion under fluorscopic guidance.  At the end of the procedure a general airway inspection was performed and there was no evidence of active bleeding. The bronchoscope was removed.  The patient tolerated the procedure well. There was no significant blood loss and there were no obvious complications. A post-procedural chest x-ray is pending.  Samples Target #1: 1. Transbronchial Wang needle biopsies from LLL 2. Transbronchial forceps biopsies from LLL  Plans:  The patient will be discharged from the PACU to home when recovered from anesthesia and after chest x-ray is reviewed. We will review the cytology, pathology and results with the patient when they become available. Outpatient followup will be with Josephine Igo, DO   Josephine Igo, DO [facility] 02/07/2023 10:53 AM

## 2023-02-07 NOTE — Research (Signed)
Title: A multi-center, prospective, single-arm, observational study to evaluate real-world outcomes for the shape-sensing Ion endoluminal system  Primary Outcome: Evaluate procedure characteristics and short and long-term patient outcomes following shape-sensing robotic-assisted bronchoscopy (ssRAB) utilizing the Ion Endoluminal System for lung lesion localization or biopsy.   Protocol # / Study Name: ISI-ION-003 Clinical Trials #: [id number] Sponsor: [employer] Principal Investigator: Dr. Elige Radon Icard  Key Features of Ion Endoluminal System (referred to as "Ion") Ion is the first FDA cleared bronchoscopy system that uses fiber optic shape sensing technology to inform on location within the airways. Its catheter/tool channel has a smaller outer diameter (3.5 mm) in comparison to conventional bronchoscopes, allowing it to navigate into the smaller airways of the periphery.     Key Inclusion Criteria Subject is 18 years or older at the time of the procedure Subject is a candidate for a planned, elective RAB lung lesion localization or biopsy procedure in which the Ion Endoluminal System is planned to be utilized.  Subject  able to understand and adhere to study requirements and provide informed consent.   Key Exclusion Criteria Subject is under the care of a Museum/gallery exhibitions officer and is unable to provide informed consent on their own accord.  Subject is participating in an interventional research study or research study with investigational agents with an unknown safety profile that would interfere with participation or the results of this study.  Male subjects who are pregnant or nursing at the time of the index Ion procedure, as determined by standard site practices. Subjects that are incarcerated or institutionalized under court order, or other vulnerable populations.    Previous Clinical Trials Since receiving FDA clearance in Feb 2019, Ion has been adopted  commercially by over 226 centers in the [place], and utilized in over 40,000 procedures.  The first in-human study enrolled 58 subjects with a mean lesion size of 14.8 mm and the overall diagnostic yield was 79.3%, with no adverse events. 17 (58.6%) lesions were reported to have a bronchus sign available on CT imaging.  A multi-center study published results in 2022, with 270 lesions biopsied in 241 patients using Ion. The mean largest cardinal lesion size was 18.86.54mm, and the mean airway generation count was 7.01.6. Asymptomatic pneumothorax occurred in 3.3% of subjects, and 0.8% experienced airway bleeding.   Another study provided preliminary results in 2022, with 87% sensitivity for malignancy, a diagnostic yield of 81%, and a mean lesion size of 16 mm. 75% of biopsy cases were bronchus-sign negative. 4% of subjects experienced pneumothoraces (including those requiring intervention), and 0.8% of subjects experienced airway bleeding requiring wedging or balloon tamponade.  A single-center study captured 131 consecutive procedures of pulmonary biopsy using Ion. The navigational success rate was 98.7%, with an overall diagnostic yield of 81.7%, an overall complication rate of 3%, and a pneumothorax rate of 1.5%.   [facility] [place] Clinical Research Coordinator note:   This visit for Subject Micheal Baker with DOB: 11-19-1945 on 02/07/2023 for the above protocol is Visit/Encounter # Pre-procedure, Intra-procedure and Post-procedure  and is for purpose of research.   The consent for this encounter is under:  Protocol Version 1.0 Investigator Brochure Version N/A Consent Version Revision A, dated 14Nov2023 and is currently IRB approved.   Micheal Baker expressed continued interest and consent in continuing as a study subject. Subject confirmed that there was no change in contact information (e.g. address, telephone, email). Subject thanked for participation in research and contribution to  science. In  this visit 02/07/2023 the subject will be evaluated by Principal Investigator named Dr. Tonia Brooms. This research coordinator has verified that the above investigator is up to date with his/her training logs.   The Subject was informed that the PI continues to have oversight of the subject's visits and course through relevant discussions, reviews, and also specifically of this visit by routing of this note to the PI.  The research study was discussed with the subject in the pre-operative room. The study was explained in detail including all the contents of the informed consent document. The subject was encouraged to ask questions. All questions were answered to their satisfaction. The IRB approved informed consent was signed, and a copy was given to the subject. After obtaining consent, the subject underwent scheduled procedure using the ion endoluminal system. Data collection was completed per protocol. Refer to paper source subject binder for further details.      Signed by  Verdene Lennert Clinical Research Coordinator / Sub-Investigator  [facility]  [place], [place] 10:45 AM 02/07/2023

## 2023-02-07 NOTE — Anesthesia Preprocedure Evaluation (Signed)
Anesthesia Evaluation  Patient identified by MRN, date of birth, ID band Patient awake    Reviewed: Allergy & Precautions, NPO status , Patient's Chart, lab work & pertinent test results  History of Anesthesia Complications Negative for: history of anesthetic complications  Airway Mallampati: III  TM Distance: >3 FB Neck ROM: Full    Dental  (+) Teeth Intact, Dental Advisory Given   Pulmonary neg shortness of breath, sleep apnea and Continuous Positive Airway Pressure Ventilation , neg COPD   breath sounds clear to auscultation       Cardiovascular hypertension, (-) angina (-) Past MI and (-) CHF (-) dysrhythmias  Rhythm:Regular  Left ventricle: The cavity size was normal. Wall thickness was    increased in a pattern of mild LVH. Systolic function was normal.    The estimated ejection fraction was in the range of 60% to 65%.    Wall motion was normal; there were no regional wall motion    abnormalities. Findings consistent with left ventricular    diastolic dysfunction.  - Mitral valve: There was trivial regurgitation.  - Left atrium: The atrium was at the upper limits of normal in    size.  - Right atrium: The atrium was mildly dilated. Central venous    pressure (est): 3 mm Hg.  - Atrial septum: No defect or patent foramen ovale was identified.  - Tricuspid valve: There was trivial regurgitation.  - Pulmonary arteries: PA peak pressure: 36 mm Hg (S).  - Pericardium, extracardiac: There was no pericardial effusion.     Neuro/Psych  PSYCHIATRIC DISORDERS Anxiety Depression    negative neurological ROS     GI/Hepatic Neg liver ROS,GERD  Medicated and Controlled,,  Endo/Other  Hypothyroidism    Renal/GU negative Renal ROS     Musculoskeletal  (+) Arthritis ,    Abdominal   Peds  Hematology negative hematology ROS (+) Lab Results      Component                Value               Date                      WBC                       5.4                 01/09/2023                HGB                      15.0                01/09/2023                HCT                      44.3                01/09/2023                MCV                      87                  01/09/2023  PLT                      236                 12/28/2022              Anesthesia Other Findings   Reproductive/Obstetrics                             Anesthesia Physical Anesthesia Plan  ASA: 2  Anesthesia Plan: General   Post-op Pain Management: Minimal or no pain anticipated   Induction: Intravenous  PONV Risk Score and Plan: 2 and Ondansetron, Propofol infusion and TIVA  Airway Management Planned: Oral ETT  Additional Equipment: None  Intra-op Plan:   Post-operative Plan: Extubation in OR  Informed Consent: I have reviewed the patients History and Physical, chart, labs and discussed the procedure including the risks, benefits and alternatives for the proposed anesthesia with the patient or authorized representative who has indicated his/her understanding and acceptance.     Dental advisory given  Plan Discussed with: CRNA  Anesthesia Plan Comments:        Anesthesia Quick Evaluation

## 2023-02-07 NOTE — Anesthesia Procedure Notes (Signed)
Procedure Name: Intubation Date/Time: 02/07/2023 9:39 AM  Performed by: Stanton Kidney, CRNAPre-anesthesia Checklist: Patient identified, Patient being monitored, Timeout performed, Emergency Drugs available and Suction available Patient Re-evaluated:Patient Re-evaluated prior to induction Oxygen Delivery Method: Circle system utilized Preoxygenation: Pre-oxygenation with 100% oxygen Induction Type: IV induction Ventilation: Mask ventilation without difficulty Laryngoscope Size: McGraph and 4 Grade View: Grade I Tube type: Oral Tube size: 8.5 mm Number of attempts: 1 Airway Equipment and Method: Stylet Placement Confirmation: ETT inserted through vocal cords under direct vision, positive ETCO2 and breath sounds checked- equal and bilateral Secured at: 24 cm Tube secured with: Tape Dental Injury: Teeth and Oropharynx as per pre-operative assessment

## 2023-02-08 ENCOUNTER — Other Ambulatory Visit: Payer: Self-pay

## 2023-02-08 ENCOUNTER — Emergency Department (HOSPITAL_COMMUNITY): Payer: Medicare PPO

## 2023-02-08 ENCOUNTER — Observation Stay (HOSPITAL_COMMUNITY)
Admission: EM | Admit: 2023-02-08 | Discharge: 2023-02-09 | Disposition: A | Discharge status: Home / Self Care | Payer: Medicare PPO | Attending: Family Medicine | Admitting: Family Medicine

## 2023-02-08 ENCOUNTER — Encounter (HOSPITAL_COMMUNITY): Payer: Self-pay | Admitting: Emergency Medicine

## 2023-02-08 DIAGNOSIS — R413 Other amnesia: Secondary | ICD-10-CM | POA: Diagnosis not present

## 2023-02-08 DIAGNOSIS — I10 Essential (primary) hypertension: Secondary | ICD-10-CM | POA: Diagnosis not present

## 2023-02-08 DIAGNOSIS — C3490 Malignant neoplasm of unspecified part of unspecified bronchus or lung: Secondary | ICD-10-CM | POA: Diagnosis not present

## 2023-02-08 DIAGNOSIS — E871 Hypo-osmolality and hyponatremia: Secondary | ICD-10-CM | POA: Diagnosis not present

## 2023-02-08 DIAGNOSIS — G454 Transient global amnesia: Principal | ICD-10-CM | POA: Insufficient documentation

## 2023-02-08 DIAGNOSIS — R4182 Altered mental status, unspecified: Secondary | ICD-10-CM | POA: Diagnosis not present

## 2023-02-08 DIAGNOSIS — E039 Hypothyroidism, unspecified: Secondary | ICD-10-CM | POA: Diagnosis not present

## 2023-02-08 DIAGNOSIS — Z79899 Other long term (current) drug therapy: Secondary | ICD-10-CM | POA: Diagnosis not present

## 2023-02-08 DIAGNOSIS — E876 Hypokalemia: Secondary | ICD-10-CM | POA: Diagnosis not present

## 2023-02-08 DIAGNOSIS — N400 Enlarged prostate without lower urinary tract symptoms: Secondary | ICD-10-CM | POA: Diagnosis present

## 2023-02-08 DIAGNOSIS — F419 Anxiety disorder, unspecified: Secondary | ICD-10-CM | POA: Diagnosis present

## 2023-02-08 LAB — COMPREHENSIVE METABOLIC PANEL
ALT: 33 U/L (ref 0–44)
AST: 32 U/L (ref 15–41)
Albumin: 4.1 g/dL (ref 3.5–5.0)
Alkaline Phosphatase: 52 U/L (ref 38–126)
Anion gap: 15 (ref 5–15)
BUN: 20 mg/dL (ref 8–23)
CO2: 19 mmol/L — ABNORMAL LOW (ref 22–32)
Calcium: 9.5 mg/dL (ref 8.9–10.3)
Chloride: 100 mmol/L (ref 98–111)
Creatinine, Ser: 0.96 mg/dL (ref 0.61–1.24)
GFR, Estimated: >60 mL/min (ref >60)
Glucose, Bld: 98 mg/dL (ref 70–99)
Potassium: 3.4 mmol/L — ABNORMAL LOW (ref 3.5–5.1)
Sodium: 134 mmol/L — ABNORMAL LOW (ref 135–145)
Total Bilirubin: 1.1 mg/dL (ref 0.3–1.2)
Total Protein: 7.2 g/dL (ref 6.5–8.1)

## 2023-02-08 LAB — CBC WITH DIFFERENTIAL/PLATELET
Abs Immature Granulocytes: 0.02 10*3/uL (ref 0.00–0.07)
Basophils Absolute: 0 10*3/uL (ref 0.0–0.1)
Basophils Relative: 0 %
Eosinophils Absolute: 0.1 10*3/uL (ref 0.0–0.5)
Eosinophils Relative: 1 %
HCT: 41.8 % (ref 39.0–52.0)
Hemoglobin: 14.5 g/dL (ref 13.0–17.0)
Immature Granulocytes: 0 %
Lymphocytes Relative: 18 %
Lymphs Abs: 1.3 10*3/uL (ref 0.7–4.0)
MCH: 30 pg (ref 26.0–34.0)
MCHC: 34.7 g/dL (ref 30.0–36.0)
MCV: 86.4 fL (ref 80.0–100.0)
Monocytes Absolute: 0.7 10*3/uL (ref 0.1–1.0)
Monocytes Relative: 9 %
Neutro Abs: 5.5 10*3/uL (ref 1.7–7.7)
Neutrophils Relative %: 72 %
Platelets: 255 10*3/uL (ref 150–400)
RBC: 4.84 MIL/uL (ref 4.22–5.81)
RDW: 13.2 % (ref 11.5–15.5)
WBC: 7.6 10*3/uL (ref 4.0–10.5)
nRBC: 0 % (ref 0.0–0.2)

## 2023-02-08 LAB — URINALYSIS, ROUTINE W REFLEX MICROSCOPIC
Bilirubin Urine: NEGATIVE
Glucose, UA: NEGATIVE mg/dL
Hgb urine dipstick: NEGATIVE
Ketones, ur: NEGATIVE mg/dL
Leukocytes,Ua: NEGATIVE
Nitrite: NEGATIVE
Protein, ur: NEGATIVE mg/dL
Specific Gravity, Urine: 1.009 (ref 1.005–1.030)
pH: 6 (ref 5.0–8.0)

## 2023-02-08 LAB — CK: Total CK: 314 U/L (ref 49–397)

## 2023-02-08 LAB — RAPID URINE DRUG SCREEN, HOSP PERFORMED
Amphetamines: NOT DETECTED
Barbiturates: NOT DETECTED
Benzodiazepines: NOT DETECTED
Cocaine: NOT DETECTED
Opiates: NOT DETECTED
Tetrahydrocannabinol: NOT DETECTED

## 2023-02-08 LAB — GLUCOSE, CAPILLARY: Glucose-Capillary: 84 mg/dL (ref 70–99)

## 2023-02-08 LAB — MAGNESIUM: Magnesium: 2.1 mg/dL (ref 1.7–2.4)

## 2023-02-08 MED ORDER — BUPROPION HCL ER (XL) 150 MG PO TB24
150.0000 mg | ORAL_TABLET | Freq: Every day | ORAL | Status: DC
  Administered 2023-02-08 – 2023-02-09 (×2): 150 mg via ORAL
  Filled 2023-02-08 (×2): qty 1

## 2023-02-08 MED ORDER — ACETAMINOPHEN 325 MG PO TABS
650.0000 mg | ORAL_TABLET | Freq: Four times a day (QID) | ORAL | Status: DC | PRN
  Administered 2023-02-08 – 2023-02-09 (×2): 650 mg via ORAL
  Filled 2023-02-08 (×2): qty 2

## 2023-02-08 MED ORDER — FINASTERIDE 5 MG PO TABS
5.0000 mg | ORAL_TABLET | Freq: Every day | ORAL | Status: DC
  Administered 2023-02-09: 5 mg via ORAL
  Filled 2023-02-08: qty 1

## 2023-02-08 MED ORDER — POLYVINYL ALCOHOL 1.4 % OP SOLN: 1.0000 [drp] | OPHTHALMIC | Status: DC | PRN

## 2023-02-08 MED ORDER — POTASSIUM CHLORIDE CRYS ER 20 MEQ PO TBCR
40.0000 meq | EXTENDED_RELEASE_TABLET | Freq: Once | ORAL | Status: AC
  Administered 2023-02-08: 40 meq via ORAL
  Filled 2023-02-08: qty 2

## 2023-02-08 MED ORDER — LEVOTHYROXINE SODIUM 75 MCG PO TABS
75.0000 ug | ORAL_TABLET | Freq: Every day | ORAL | Status: DC
  Administered 2023-02-09: 75 ug via ORAL
  Filled 2023-02-08: qty 1

## 2023-02-08 MED ORDER — TAMSULOSIN HCL 0.4 MG PO CAPS
0.4000 mg | ORAL_CAPSULE | Freq: Every day | ORAL | Status: DC
  Administered 2023-02-09: 0.4 mg via ORAL
  Filled 2023-02-08: qty 1

## 2023-02-08 MED ORDER — ONDANSETRON HCL 4 MG/2ML IJ SOLN: 4.0000 mg | Freq: Four times a day (QID) | INTRAMUSCULAR | Status: DC | PRN

## 2023-02-08 MED ORDER — ESCITALOPRAM OXALATE 10 MG PO TABS
20.0000 mg | ORAL_TABLET | Freq: Every day | ORAL | Status: DC
  Administered 2023-02-09: 20 mg via ORAL
  Filled 2023-02-08: qty 2

## 2023-02-08 MED ORDER — ONDANSETRON HCL 4 MG PO TABS: 4.0000 mg | ORAL_TABLET | Freq: Four times a day (QID) | ORAL | Status: DC | PRN

## 2023-02-08 MED ORDER — PANTOPRAZOLE SODIUM 40 MG PO TBEC
40.0000 mg | DELAYED_RELEASE_TABLET | Freq: Every day | ORAL | Status: DC
  Administered 2023-02-08 – 2023-02-09 (×2): 40 mg via ORAL
  Filled 2023-02-08 (×2): qty 1

## 2023-02-08 MED ORDER — ACETAMINOPHEN 650 MG RE SUPP: 650.0000 mg | Freq: Four times a day (QID) | RECTAL | Status: DC | PRN

## 2023-02-08 MED ORDER — POLYETHYLENE GLYCOL 3350 17 G PO PACK: 17.0000 g | PACK | Freq: Every day | ORAL | Status: DC | PRN

## 2023-02-08 MED ORDER — PROPYLENE GLYCOL 0.6 % OP SOLN: 1.0000 [drp] | OPHTHALMIC | Status: DC | PRN

## 2023-02-08 MED ORDER — ENOXAPARIN SODIUM 40 MG/0.4ML IJ SOSY
40.0000 mg | PREFILLED_SYRINGE | INTRAMUSCULAR | Status: DC
  Administered 2023-02-08: 40 mg via SUBCUTANEOUS
  Filled 2023-02-08: qty 0.4

## 2023-02-08 NOTE — ED Notes (Addendum)
Pt's daughter is now on her way,Lives in [place].

## 2023-02-08 NOTE — Assessment & Plan Note (Addendum)
Resume finasteride

## 2023-02-08 NOTE — H&P (Signed)
History and Physical    Micheal Baker:[id number] DOB: 1946/06/20 DOA: 02/08/2023  PCP: [name], MD   Patient coming from: Home  I have personally briefly reviewed patient's old medical records in [facility]  Chief Complaint: Memory Loss  HPI: Micheal Baker is a 77 y.o. male with medical history significant for BPH, HTN, OSA.  Patient drove himself to the ED.  On arrival to the ED he did not remember the events of today, or why he came here, he does not remember getting in his car or why he drove to the ED, does not remember having breakfast.  His daughter called talked to him on the phone at about 9:30 AM in the morning, he does not remember this conversation.  He does remember getting a bronchoscopy yesterday at [facility].  At the time of my evaluation, he is remembering a few things like his medical history of retinal detachment, but still has no recollection of the above mentioned events.  Patient lives alone. Reports some mild frontal headache.  No history of seizures no history of strokes. No weakness of his extremities.   ED Course: Temperature 97.6.  Heart rate 54-59.  Respirate rate 16-18.  Blood pressure systolic 140s to 811B.  O2 sats greater than 95% on room air. Brain MRI-no acute intracranial process. Neurology was consulted, possible transient global amnesia, possible witnessed seizure.  Recommended EEG.  If further events obtain brain MRI with contrast for completeness.  Review of Systems: As per HPI all other systems reviewed and negative.  Past Medical History:  Diagnosis Date   Acute medial meniscus tear of left knee    Depression    Frequent PVCs    GERD (gastroesophageal reflux disease)    History of kidney stones    Hypertension    Hypothyroidism    NSVT (nonsustained ventricular tachycardia) (HCC)    Osteoarthritis    Sleep apnea    Thyroid disease     Past Surgical History:  Procedure Laterality Date   BIOPSY  12/01/2022    Procedure: BIOPSY;  Surgeon: Lanelle Bal, DO;  [facility];  Service: Endoscopy;;   CATARACT EXTRACTION     left   COLONOSCOPY N/A 04/08/2016   Surgeon: West Bali, MD; one 6 mm polyp in the cecum removed, one 4 mm polyp in the descending colon removed, nonbleeding internal hemorrhoids.  Pathology with hyperplastic polyps.  Recommended colonoscopy in 10 years.   COLONOSCOPY  12/01/2022   COLONOSCOPY WITH PROPOFOL N/A 12/01/2022   Procedure: COLONOSCOPY WITH PROPOFOL;  Surgeon: Lanelle Bal, DO;  [facility];  Service: Endoscopy;  Laterality: N/A;  8:15 am, asa 3   endosocopy  12/01/2022   ESOPHAGOGASTRODUODENOSCOPY (EGD) WITH PROPOFOL N/A 12/01/2022   Procedure: ESOPHAGOGASTRODUODENOSCOPY (EGD) WITH PROPOFOL;  Surgeon: Lanelle Bal, DO;  [facility];  Service: Endoscopy;  Laterality: N/A;   EYE SURGERY     left-scar tissue   groin surgery Right 2010   growth removed    KNEE ARTHROSCOPY     left   KNEE ARTHROSCOPY WITH LATERAL MENISECTOMY Right 03/11/2015   Procedure: RIGHT KNEE ARTHROSCOPY WITH MEDIALAND LATERAL MENISECTOMY;  Surgeon: Salvatore Marvel, MD;  Location: [facility];  Service: Orthopedics;  Laterality: Right;   KNEE ARTHROSCOPY WITH MEDIAL MENISECTOMY Left 06/27/2014   Procedure: LEFT KNEE ARTHROSCOPY WITH PARTIAL MEDIAL AND LATERAL MENISECTOMIES AND CHONDROPLASTY;  Surgeon: Nilda Simmer, MD;  Location: [facility];  Service: Orthopedics;  Laterality: Left;   KNEE ARTHROSCOPY WITH MEDIAL MENISECTOMY Right 03/11/2015   Procedure: KNEE ARTHROSCOPY WITH MEDIAL MENISECTOMY;  Surgeon: Salvatore Marvel, MD;  Location: [facility];  Service: Orthopedics;  Laterality: Right;   LEFT HEART CATHETERIZATION WITH CORONARY ANGIOGRAM N/A 09/14/2011   Procedure: LEFT HEART CATHETERIZATION WITH CORONARY ANGIOGRAM;  Surgeon: [name], MD;  Location: [facility];  Service: Cardiovascular;   Laterality: N/A;   POLYPECTOMY  04/08/2016   Procedure: POLYPECTOMY;  Surgeon: West Bali, MD;  [facility];  Service: Endoscopy;;  colon    POLYPECTOMY  12/01/2022   POLYPECTOMY  12/01/2022   Procedure: POLYPECTOMY;  Surgeon: Lanelle Bal, DO;  [facility];  Service: Endoscopy;;   RETINAL DETACHMENT SURGERY Left 2002   Dr. Hermina Barters     reports that he has never smoked. He has never used smokeless tobacco. He reports current alcohol use. He reports that he does not use drugs.  Allergies  Allergen Reactions   Avelox [Moxifloxacin] Other (See Comments)    Caused tendonitis   Penicillins Other (See Comments)    Chills     Family History  Problem Relation Age of Onset   Hypertension Mother    Hypertension Father    Heart attack Father    Diabetes Father    Asthma Daughter    Colon cancer Neg Hx    Stomach cancer Neg Hx    Prior to Admission medications   Medication Sig Start Date End Date Taking? Authorizing Provider  acetaminophen (TYLENOL) 500 MG tablet Take 500-1,000 mg by mouth every 6 (six) hours as needed for mild pain, headache or moderate pain.    [provider]  ALPHA LIPOIC ACID PO Take 1 capsule by mouth in the morning.    [provider]  B Complex-C (B-COMPLEX WITH VITAMIN C) tablet Take 1 tablet by mouth in the morning.    [provider]  calcium carbonate (TUMS - DOSED IN MG ELEMENTAL CALCIUM) 500 MG chewable tablet Chew 1 tablet (200 mg of elemental calcium total) by mouth 3 (three) times daily with meals. 12/28/22   Shon Hale, MD  diclofenac Sodium (VOLTAREN) 1 % GEL Apply 1 Application topically 4 (four) times daily as needed (knee pain).    [provider]  doxylamine, Sleep, (UNISOM) 25 MG tablet Take 25 mg by mouth at bedtime as needed for sleep.    [provider]  escitalopram (LEXAPRO) 20 MG tablet Take 20 mg by mouth daily.    [provider]  finasteride  (PROSCAR) 5 MG tablet Take 1 tablet (5 mg total) by mouth daily. 12/28/22   Shon Hale, MD  fluticasone (FLONASE) 50 MCG/ACT nasal spray Place 1 spray into both nostrils daily as needed for allergies. 09/20/22   [provider]  levothyroxine (SYNTHROID, LEVOTHROID) 75 MCG tablet Take 75 mcg by mouth daily before breakfast. 03/27/15   [provider]  pantoprazole (PROTONIX) 40 MG tablet Take 40 mg by mouth daily. 09/20/22   [provider]  Propylene Glycol (SYSTANE COMPLETE) 0.6 % SOLN Place 1 drop into both eyes as needed (dry eyes).    [provider]  silodosin (RAPAFLO) 8 MG CAPS capsule Take 1 capsule (8 mg total) by mouth at bedtime. 12/28/22   Shon Hale, MD    Physical Exam: Vitals:   02/08/23 1213 02/08/23 1214 02/08/23 1245 02/08/23 1521  BP: (!) 163/109  (!) 179/92 Marland Kitchen)  149/97  Pulse: (!) 58  (!) 59 (!) 57  Resp: 18  (!) 8 16  Temp: 97.6 F (36.4 C)     SpO2: 95%  98% 97%  Weight:  95.3 kg    Height:  6\' 5"  (1.956 m)      Constitutional: NAD, calm, comfortable Vitals:   02/08/23 1213 02/08/23 1214 02/08/23 1245 02/08/23 1521  BP: (!) 163/109  (!) 179/92 (!) 149/97  Pulse: (!) 58  (!) 59 (!) 57  Resp: 18  (!) 8 16  Temp: 97.6 F (36.4 C)     SpO2: 95%  98% 97%  Weight:  95.3 kg    Height:  6\' 5"  (1.956 m)     Eyes: PERRL, lids and conjunctivae normal ENMT: Mucous membranes are moist.  Neck: normal, supple, no masses, no thyromegaly Respiratory: clear to auscultation bilaterally, no wheezing, no crackles. Normal respiratory effort. No accessory muscle use.  Cardiovascular: Regular rate and rhythm, no murmurs / rubs / gallops. No extremity edema.  Extremities warm Abdomen: no tenderness, no masses palpated. No hepatosplenomegaly. Bowel sounds positive.  Musculoskeletal: no clubbing / cyanosis. No joint deformity upper and lower extremities. Skin: no rashes, lesions, ulcers. No induration Neurologic:  Neurological:     Mental  Status: he is alert.     GCS: GCS eye subscore is 4. GCS verbal subscore is 5. GCS motor subscore is 6.     Comments: Mental Status:  Alert, oriented, thought content appropriate, able to give a coherent history. Speech fluent without evidence of aphasia. Able to follow 2 step commands without difficulty.  Cranial Nerves:  II:  Peripheral visual fields grossly normal, pupils equal, round, reactive to light III,IV, VI: ptosis not present, extra-ocular motions intact bilaterally  V,VII: At rest, he has slight flattening of left nasolabial fold, not quite as obvious when he smiles, patient is unaware if this is normal for him or if this is new. VIII: hearing grossly normal to voice  XII: midline tongue extension without fassiculations Motor:  Normal tone.  5/5 strength bilateral lower and upper extremities  sensory: Sensation intact to light touch in all extremities.   CV: distal pulses palpable throughout    Psychiatric: Normal judgment and insight. Alert and oriented x 3. Normal mood.   Labs on Admission: I have personally reviewed following labs and imaging studies  CBC: Recent Labs  Lab 02/08/23 1247  WBC 7.6  NEUTROABS 5.5  HGB 14.5  HCT 41.8  MCV 86.4  PLT 255   Basic Metabolic Panel: Recent Labs  Lab 02/08/23 1247  NA 134*  K 3.4*  CL 100  CO2 19*  GLUCOSE 98  BUN 20  CREATININE 0.96  CALCIUM 9.5  MG 2.1   Liver Function Tests: Recent Labs  Lab 02/08/23 1247  AST 32  ALT 33  ALKPHOS 52  BILITOT 1.1  PROT 7.2  ALBUMIN 4.1   Cardiac Enzymes: Recent Labs  Lab 02/08/23 1247  CKTOTAL 314   Urine analysis:    Component Value Date/Time   COLORURINE YELLOW 02/08/2023 1220   APPEARANCEUR HAZY (A) 02/08/2023 1220   APPEARANCEUR Clear 01/18/2023 1125   LABSPEC 1.009 02/08/2023 1220   PHURINE 6.0 02/08/2023 1220   GLUCOSEU NEGATIVE 02/08/2023 1220   HGBUR NEGATIVE 02/08/2023 1220   BILIRUBINUR NEGATIVE 02/08/2023 1220   BILIRUBINUR Negative 01/18/2023  1125   KETONESUR NEGATIVE 02/08/2023 1220   PROTEINUR NEGATIVE 02/08/2023 1220   UROBILINOGEN 0.2 08/08/2011 0800   NITRITE NEGATIVE 02/08/2023 1220  LEUKOCYTESUR NEGATIVE 02/08/2023 1220    Radiological Exams on Admission: MR BRAIN WO CONTRAST  Result Date: 02/08/2023 CLINICAL DATA:  Altered mental status EXAM: MRI HEAD WITHOUT CONTRAST TECHNIQUE: Multiplanar, multiecho pulse sequences of the brain and surrounding structures were obtained without intravenous contrast. COMPARISON:  MR Head 05/12/21 FINDINGS: Brain: Negative for an acute infarct. No hydrocephalus. No acute hemorrhage. No extra-axial fluid collection. Sequela of mild chronic microvascular ischemic change. Vascular: Normal flow voids. Skull and upper cervical spine: Normal marrow signal. Sinuses/Orbits: Middle ear or mastoid effusion. Mild mucosal thickening bilateral ethmoid sinuses in the left maxillary sinus. Left lens replacement. Orbits are otherwise unremarkable. Other: None. IMPRESSION: No acute intracranial process. Electronically Signed   By: Lorenza Cambridge M.D.   On: 02/08/2023 13:46   DG Chest Port 1 View  Result Date: 02/07/2023 CLINICAL DATA:  Status post bronchoscopy EXAM: PORTABLE CHEST 1 VIEW COMPARISON:  None Available. FINDINGS: Low volume AP portable examination. Mild cardiomegaly. Biopsy marking clip projects over the left lung base. Both lungs are clear. The visualized skeletal structures are unremarkable. IMPRESSION: 1. Mild cardiomegaly without acute abnormality of the lungs in low volume AP portable examination. 2.  Biopsy marking clip projects over the left lung base. Electronically Signed   By: Jearld Lesch M.D.   On: 02/07/2023 11:22   DG C-ARM BRONCHOSCOPY  Result Date: 02/07/2023 C-ARM BRONCHOSCOPY: Fluoroscopy was utilized by the requesting physician.  No radiographic interpretation.    EKG: Independently reviewed.  EKG yesterday shows sinus rhythm, rate 65, QTc 472.  Assessment/Plan Principal  Problem:   Amnesia Active Problems:   BPH (benign prostatic hyperplasia)   Anxiety and depression  Assessment and Plan: * Amnesia Differentials include transient global amnesia, possible unwitnessed seizure. On exam slight flattening of left nasolabial fold, normal acuity, but MRI brain negative for stroke.   - Patient underwent general endotracheal anesthesia yesterday 5/28 for flexible video fiberoptic bronchoscopy with robotic assistance on biopsies.  Tolerated procedure well had no complications.  Per notes- He underwent endotracheal intubation received Ondansetron, propofol infusion, TIVA (?? total Intravenous anesthesia) -Teleneurology was consulted, recommended MRI brain with contrast for completeness, if further events. Check Magnesium, CK TSH, B12, thiamine, HIV, RPR, EKG, Troponin for reversible causes screening EEG, Routine Teleconsult with neurology tomorrow. -I reviewed adverse reactions of propofol on up-to-date, I do not see any reactions related to amnesia  Essential hypertension Systolic 140s to 782N .  Not on medication.  Anxiety and depression Resume bupropion, Lexapro  BPH (benign prostatic hyperplasia) Resume  finasteride   DVT prophylaxis: Lovenox Code Status: FULL code Family Communication: None at bedside Disposition Plan: ~ 1-2 days Consults called: neurology Admission status:  Obs tele   Author: Onnie Boer, MD 02/08/2023 8:23 PM  For on call review [email or web address].

## 2023-02-08 NOTE — Assessment & Plan Note (Addendum)
Systolic 140s to 161W .  Not on medication.

## 2023-02-08 NOTE — Progress Notes (Signed)
Sarah, Path pending from bronch. But he will need a referral to urology for evaluation of the prostate. I would check his PSA level as well.   Thanks,  BLI  Josephine Igo, DO [facility] 02/08/2023 12:07 PM

## 2023-02-08 NOTE — ED Triage Notes (Signed)
Pt via POV unsure of why he came here. He is very confused and has no memory of why he got in his car this morning. He does not remember having a procedure yesterday (bronchoscopy at [facility] with biopsies) and does not remember having a pulmonologist. Pt is unsure of his age and is disoriented to time. Dr Estell Harpin informed and examined  pt in triage; due to unknown LKW, no code stroke activation per Dr. Estell Harpin. Awaiting orders. Pt BP noted to be 163/109 in triage.

## 2023-02-08 NOTE — Progress Notes (Signed)
Patient arrived to room 341 around 1800. He is a&o x4. Educated patient about unit and call bell. Call bell within reach.

## 2023-02-08 NOTE — Plan of Care (Signed)
77 y.o. s/p biopsy of lung nodule yesterday, normal at 9 AM in conversation w/ daughter, drove to ED arriving at 12:16 PM. Gradually returning to baseline   Basic Metabolic Panel: Recent Labs  Lab 02/08/23 1247  NA 134*  K 3.4*  CL 100  CO2 19*  GLUCOSE 98  BUN 20  CREATININE 0.96  CALCIUM 9.5    CBC: Recent Labs  Lab 02/08/23 1247  WBC 7.6  NEUTROABS 5.5  HGB 14.5  HCT 41.8  MCV 86.4  PLT 255    Coagulation Studies: No results for input(s): "LABPROT", "INR" in the last 72 hours.   MRI brain w/o unremarkable   Impression Possible TGA, possible unwitnessed seizure  Initial recs If any further events would get MRI brain w/ contrast for completeness Check Magnesium, CK TSH, B12, thiamine, HIV, RPR, EKG, Troponin for reversible causes screening EEG, Routine Teleconsult with neurology tomorrow, please place consult order per AMION schedule

## 2023-02-08 NOTE — Anesthesia Postprocedure Evaluation (Signed)
Anesthesia Post Note  Patient: Micheal Baker  Procedure(s) Performed: ROBOTIC ASSISTED NAVIGATIONAL BRONCHOSCOPY (Left) BRONCHIAL NEEDLE ASPIRATION BIOPSIES BRONCHIAL BIOPSIES FIDUCIAL MARKER PLACEMENT     Patient location during evaluation: PACU Anesthesia Type: General Level of consciousness: awake and alert Pain management: pain level controlled Vital Signs Assessment: post-procedure vital signs reviewed and stable Respiratory status: spontaneous breathing, nonlabored ventilation, respiratory function stable, patient connected to nasal cannula oxygen and aerosol facemask Cardiovascular status: blood pressure returned to baseline and stable Postop Assessment: no apparent nausea or vomiting Anesthetic complications: no   No notable events documented.  Last Vitals:  Vitals:   02/07/23 1115 02/07/23 1130  BP: 135/77 129/82  Pulse: (!) 55 (!) 56  Resp: 12 14  Temp:  36.4 C  SpO2: 100% 98%    Last Pain:  Vitals:   02/07/23 1130  TempSrc:   PainSc: 0-No pain                 Arista Kettlewell

## 2023-02-08 NOTE — ED Notes (Signed)
Placed call to pt's daughter to inform of pt location

## 2023-02-08 NOTE — Assessment & Plan Note (Addendum)
Differentials include transient global amnesia, possible unwitnessed seizure. On exam slight flattening of left nasolabial fold, normal acuity, but MRI brain negative for stroke.   - Patient underwent general endotracheal anesthesia yesterday 5/28 for flexible video fiberoptic bronchoscopy with robotic assistance on biopsies.  Tolerated procedure well had no complications.  Per notes- He underwent endotracheal intubation received Ondansetron, propofol infusion, TIVA (?? total Intravenous anesthesia) -Teleneurology was consulted, recommended MRI brain with contrast for completeness, if further events. Check Magnesium, CK TSH, B12, thiamine, HIV, RPR, EKG, Troponin for reversible causes screening EEG, Routine Teleconsult with neurology tomorrow. -I reviewed adverse reactions of propofol on up-to-date, I do not see any reactions related to amnesia

## 2023-02-08 NOTE — Assessment & Plan Note (Signed)
Resume bupropion, Lexapro

## 2023-02-08 NOTE — ED Provider Notes (Signed)
[place] EMERGENCY DEPARTMENT AT [facility] Provider Note   CSN: [id number] Arrival date & time: 02/08/23  1207     History  Chief Complaint  Patient presents with   Altered Mental Status    Micheal Baker is a 77 y.o. male.   Altered Mental Status Presenting symptoms: confusion   Associated symptoms: headaches   Associated symptoms: no abdominal pain, no fever, no nausea, no rash, no vomiting and no weakness        Micheal Baker is a 77 y.o. male past medical history of hypertension, thyroid disease, history of kidney stones and lung nodule (underwent biopsy of lung nodule yesterday) who presents to the Emergency Department complaining of difficulty with memory and recall.  Last known well is unknown.  States he does not remember having the procedure yesterday, does not recall how he got here or waking up this morning.  Complains of diffuse frontal headache.  Denies chest pain, nausea, vomiting or shortness of breath.  On further history, patient's daughter who is at bedside states that she spoke with her father this morning at 29 and states he was coherent.  Home Medications Prior to Admission medications   Medication Sig Start Date End Date Taking? Authorizing Provider  acetaminophen (TYLENOL) 500 MG tablet Take 500-1,000 mg by mouth every 6 (six) hours as needed for mild pain, headache or moderate pain.    [provider]  ALPHA LIPOIC ACID PO Take 1 capsule by mouth in the morning.    [provider]  B Complex-C (B-COMPLEX WITH VITAMIN C) tablet Take 1 tablet by mouth in the morning.    [provider]  calcium carbonate (TUMS - DOSED IN MG ELEMENTAL CALCIUM) 500 MG chewable tablet Chew 1 tablet (200 mg of elemental calcium total) by mouth 3 (three) times daily with meals. 12/28/22   Shon Hale, MD  diclofenac Sodium (VOLTAREN) 1 % GEL Apply 1 Application topically 4 (four) times daily as needed (knee pain).    [provider]  doxylamine, Sleep, (UNISOM) 25 MG tablet Take 25 mg by mouth at bedtime as needed for sleep.    [provider]  escitalopram (LEXAPRO) 20 MG tablet Take 20 mg by mouth daily.    [provider]  finasteride (PROSCAR) 5 MG tablet Take 1 tablet (5 mg total) by mouth daily. 12/28/22   Shon Hale, MD  fluticasone (FLONASE) 50 MCG/ACT nasal spray Place 1 spray into both nostrils daily as needed for allergies. 09/20/22   [provider]  levothyroxine (SYNTHROID, LEVOTHROID) 75 MCG tablet Take 75 mcg by mouth daily before breakfast. 03/27/15   [provider]  pantoprazole (PROTONIX) 40 MG tablet Take 40 mg by mouth daily. 09/20/22   [provider]  Propylene Glycol (SYSTANE COMPLETE) 0.6 % SOLN Place 1 drop into both eyes as needed (dry eyes).    [provider]  silodosin (RAPAFLO) 8 MG CAPS capsule Take 1 capsule (8 mg total) by mouth at bedtime. 12/28/22   Shon Hale, MD      Allergies    Avelox [moxifloxacin] and Penicillins    Review of Systems   Review of Systems  Constitutional:  Negative for appetite change, chills and fever.  Respiratory:  Negative for cough and shortness of breath.   Cardiovascular:  Negative for chest pain.  Gastrointestinal:  Negative for abdominal pain, nausea and vomiting.  Musculoskeletal:  Negative for back pain, neck pain and neck stiffness.  Skin:  Negative for rash.  Neurological:  Positive for headaches. Negative for dizziness, syncope, facial asymmetry, speech difficulty, weakness and numbness.  Psychiatric/Behavioral:  Positive for confusion.     Physical Exam Updated Vital Signs BP (!) 149/97   Pulse (!) 57   Temp 97.6 F (36.4 C)   Resp 16   Ht 6\' 5"  (1.956 m)   Wt 95.3 kg   SpO2 97%   BMI 24.90 kg/m  Physical Exam Vitals and nursing note reviewed.  Constitutional:      General: He is not in acute distress.    Appearance: Normal appearance. He is not ill-appearing  or toxic-appearing.  HENT:     Head: Atraumatic.     Mouth/Throat:     Mouth: Mucous membranes are moist.  Eyes:     Extraocular Movements: Extraocular movements intact.     Conjunctiva/sclera: Conjunctivae normal.     Pupils: Pupils are equal, round, and reactive to light.  Cardiovascular:     Rate and Rhythm: Normal rate and regular rhythm.     Pulses: Normal pulses.  Pulmonary:     Breath sounds: Normal breath sounds.  Chest:     Chest wall: No tenderness.  Abdominal:     Palpations: Abdomen is soft.     Tenderness: There is no abdominal tenderness.  Musculoskeletal:        General: Normal range of motion.     Cervical back: Normal range of motion. No rigidity.     Right lower leg: No edema.     Left lower leg: No edema.  Lymphadenopathy:     Cervical: No cervical adenopathy.  Skin:    General: Skin is warm.     Capillary Refill: Capillary refill takes less than 2 seconds.  Neurological:     General: No focal deficit present.     Mental Status: He is alert and oriented to person, place, and time.     GCS: GCS eye subscore is 4. GCS verbal subscore is 5. GCS motor subscore is 6.     Cranial Nerves: Cranial nerves 2-12 are intact.     Sensory: Sensation is intact.     Motor: Motor function is intact. No weakness, tremor, abnormal muscle tone or pronator drift.     Coordination: Finger-Nose-Finger Test normal.     Comments: CN II through XII intact.  Speech clear.  No facial droop.  Normal finger-nose testing, no pronator drift. mentating well but cannot recall events from yesterday or this morning.  He does recall speaking with triage nurse on arrival here     ED Results / Procedures / Treatments   Labs (all labs ordered are listed, but only abnormal results are displayed) Labs Reviewed  COMPREHENSIVE METABOLIC PANEL - Abnormal; Notable for the following components:      Result Value   Sodium 134 (*)    Potassium 3.4 (*)    CO2 19 (*)    All other components within  normal limits  URINALYSIS, ROUTINE W REFLEX MICROSCOPIC - Abnormal; Notable for the following components:   APPearance HAZY (*)    All other components within normal limits  CBC WITH DIFFERENTIAL/PLATELET  RAPID URINE DRUG SCREEN, HOSP PERFORMED  MAGNESIUM  CK    EKG None  Radiology MR BRAIN WO CONTRAST  Result Date: 02/08/2023 CLINICAL DATA:  Altered mental status EXAM: MRI HEAD WITHOUT CONTRAST TECHNIQUE: Multiplanar, multiecho pulse sequences of the brain and surrounding structures were obtained without intravenous contrast. COMPARISON:  MR Head 05/12/21 FINDINGS:  Brain: Negative for an acute infarct. No hydrocephalus. No acute hemorrhage. No extra-axial fluid collection. Sequela of mild chronic microvascular ischemic change. Vascular: Normal flow voids. Skull and upper cervical spine: Normal marrow signal. Sinuses/Orbits: Middle ear or mastoid effusion. Mild mucosal thickening bilateral ethmoid sinuses in the left maxillary sinus. Left lens replacement. Orbits are otherwise unremarkable. Other: None. IMPRESSION: No acute intracranial process. Electronically Signed   By: Lorenza Cambridge M.D.   On: 02/08/2023 13:46   DG Chest Port 1 View  Result Date: 02/07/2023 CLINICAL DATA:  Status post bronchoscopy EXAM: PORTABLE CHEST 1 VIEW COMPARISON:  None Available. FINDINGS: Low volume AP portable examination. Mild cardiomegaly. Biopsy marking clip projects over the left lung base. Both lungs are clear. The visualized skeletal structures are unremarkable. IMPRESSION: 1. Mild cardiomegaly without acute abnormality of the lungs in low volume AP portable examination. 2.  Biopsy marking clip projects over the left lung base. Electronically Signed   By: Jearld Lesch M.D.   On: 02/07/2023 11:22   DG C-ARM BRONCHOSCOPY  Result Date: 02/07/2023 C-ARM BRONCHOSCOPY: Fluoroscopy was utilized by the requesting physician.  No radiographic interpretation.    Procedures Procedures    Medications Ordered in  ED Medications - No data to display  ED Course/ Medical Decision Making/ A&P                             Medical Decision Making Patient here for difficulty with memory and recall.  Underwent lung biopsy yesterday.  Cannot recall events of this morning.  Daughter who is it now at bedside states she spoke with her father at 32 this morning and he was coherent and was speaking at his baseline.  Patient does not recall this conversation or how he arrived at the ER.  Differential would include but not limited to transient global amnesia, stroke, TIA, infection, sepsis, side effect of anesthesia  Amount and/or Complexity of Data Reviewed Labs: ordered.    Details: Labs interpreted by me, no leukocytosis, hemoglobin unremarkable.  Chemistries without significant derangement magnesium unremarkable, UDS negative, urinalysis without evidence of infection Radiology: ordered.    Details: MRI brain without acute findings ECG/medicine tests:     Details: Patient had EKG yesterday that shows sinus rhythm with premature supraventricular complex Discussion of management or test interpretation with external provider(s): Consulted with neurology, Dr. Iver Nestle who recommended patient be admitted here for EEG tomorrow and video neuro consult for tomorrow as well.  Also consulted with [facility] hospitalist, Dr. Mariea Clonts who agrees to admit  Risk Decision regarding hospitalization.           Final Clinical Impression(s) / ED Diagnoses Final diagnoses:  Amnesia    Rx / DC Orders ED Discharge Orders     None         Rosey Bath 02/08/23 1839    Bethann Berkshire, MD 02/12/23 1300

## 2023-02-09 ENCOUNTER — Observation Stay (HOSPITAL_COMMUNITY)
Admit: 2023-02-09 | Discharge: 2023-02-09 | Disposition: A | Discharge status: Home / Self Care | Payer: Medicare PPO | Attending: Family Medicine | Admitting: Family Medicine

## 2023-02-09 DIAGNOSIS — R413 Other amnesia: Secondary | ICD-10-CM | POA: Diagnosis not present

## 2023-02-09 DIAGNOSIS — R4182 Altered mental status, unspecified: Secondary | ICD-10-CM | POA: Diagnosis not present

## 2023-02-09 DIAGNOSIS — R569 Unspecified convulsions: Secondary | ICD-10-CM

## 2023-02-09 DIAGNOSIS — G454 Transient global amnesia: Secondary | ICD-10-CM | POA: Diagnosis not present

## 2023-02-09 LAB — RPR: RPR Ser Ql: NONREACTIVE

## 2023-02-09 LAB — CYTOLOGY - NON PAP

## 2023-02-09 LAB — HIV ANTIBODY (ROUTINE TESTING W REFLEX): HIV Screen 4th Generation wRfx: NONREACTIVE

## 2023-02-09 LAB — TSH: TSH: 5.294 u[IU]/mL — ABNORMAL HIGH (ref 0.350–4.500)

## 2023-02-09 LAB — VITAMIN B12: Vitamin B-12: 423 pg/mL (ref 180–914)

## 2023-02-09 MED ORDER — LEVOTHYROXINE SODIUM 88 MCG PO TABS: 88.0000 ug | ORAL_TABLET | Freq: Every day | ORAL | 2 refills | Status: AC

## 2023-02-09 MED ORDER — SALINE SPRAY 0.65 % NA SOLN
1.0000 | NASAL | Status: DC | PRN
  Administered 2023-02-09: 1 via NASAL
  Filled 2023-02-09: qty 44

## 2023-02-09 NOTE — Procedures (Signed)
Patient Name: ADIL CIOCCA  MRN: [id number]  Epilepsy Attending: Charlsie Quest  Referring Physician/Provider: Onnie Boer, MD  Date: 02/09/2023 Duration: 23.07 mins  Patient history: 77 year old male with transient alteration of awareness.  EEG to evaluate for seizure.  Level of alertness: Awake, asleep  AEDs during EEG study: None  Technical aspects: This EEG study was done with scalp electrodes positioned according to the 10-20 International system of electrode placement. Electrical activity was reviewed with band pass filter of 1-70Hz , sensitivity of 7 uV/mm, display speed of 71mm/sec with a 60Hz  notched filter applied as appropriate. EEG data were recorded continuously and digitally stored.  Video monitoring was available and reviewed as appropriate.  Description: The posterior dominant rhythm consists of 9 Hz activity of moderate voltage (25-35 uV) seen predominantly in posterior head regions, symmetric and reactive to eye opening and eye closing. Sleep was characterized by vertex waves, sleep spindles (12 to 14 Hz), maximal frontocentral region.  Hyperventilation and photic stimulation were not performed.     IMPRESSION: This study is within normal limits. No seizures or epileptiform discharges were seen throughout the recording.  A normal interictal EEG does not exclude the diagnosis of epilepsy.  Acire Tang Annabelle Harman

## 2023-02-09 NOTE — Consult Note (Signed)
I connected with  Micheal Baker on 02/09/23 by a video enabled telemedicine application and verified that I am speaking with the correct person using two identifiers.   I discussed the limitations of evaluation and management by telemedicine. The patient expressed understanding and agreed to proceed.  Location of patient: [facility] Location of physician: [facility]  Neurology Consultation Reason for Consult: Transient alteration of awareness Referring Physician: Dr Kennis Carina  CC: Transient alteration of awareness  History is obtained from: Patient, chart review  HPI: Micheal Baker is a 77 y.o. male with past medical history of hypothyroidism, hypertension, PVCs, depression who presented with altered mental status.  Patient states he remembers nothing from yesterday morning but memory did gradually come back yesterday evening.  Denies any previous similar symptoms.  Denies any staring, tonic-clonic seizure-like activity.  Denies focal weakness, numbness, vision changes.  Denies any new medications.  Denies any recent infections.  ROS: All other systems reviewed and negative except as noted in the HPI.   Past Medical History:  Diagnosis Date   Acute medial meniscus tear of left knee    Depression    Frequent PVCs    GERD (gastroesophageal reflux disease)    History of kidney stones    Hypertension    Hypothyroidism    NSVT (nonsustained ventricular tachycardia) (HCC)    Osteoarthritis    Sleep apnea    Thyroid disease      Family History  Problem Relation Age of Onset   Hypertension Mother    Hypertension Father    Heart attack Father    Diabetes Father    Asthma Daughter    Colon cancer Neg Hx    Stomach cancer Neg Hx     Social History:  reports that he has never smoked. He has never used smokeless tobacco. He reports current alcohol use. He reports that he does not use drugs.  Medications Prior to Admission  Medication Sig Dispense Refill  Last Dose   acetaminophen (TYLENOL) 500 MG tablet Take 500-1,000 mg by mouth every 6 (six) hours as needed for mild pain, headache or moderate pain.   UNKNOWN   ALPHA LIPOIC ACID PO Take 1 capsule by mouth in the morning.   02/08/2023   B Complex-C (B-COMPLEX WITH VITAMIN C) tablet Take 1 tablet by mouth in the morning.   02/08/2023   buPROPion (WELLBUTRIN XL) 150 MG 24 hr tablet Take 150 mg by mouth daily.   02/07/2023   calcium carbonate (TUMS - DOSED IN MG ELEMENTAL CALCIUM) 500 MG chewable tablet Chew 1 tablet (200 mg of elemental calcium total) by mouth 3 (three) times daily with meals. 90 tablet 4 02/07/2023   diclofenac Sodium (VOLTAREN) 1 % GEL Apply 1 Application topically 4 (four) times daily as needed (knee pain).   UNKNOWN   doxylamine, Sleep, (UNISOM) 25 MG tablet Take 25 mg by mouth at bedtime as needed for sleep.   Past Week   escitalopram (LEXAPRO) 20 MG tablet Take 20 mg by mouth daily.   02/08/2023   finasteride (PROSCAR) 5 MG tablet Take 1 tablet (5 mg total) by mouth daily. 90 tablet 3 02/08/2023   fluticasone (FLONASE) 50 MCG/ACT nasal spray Place 1 spray into both nostrils daily as needed for allergies.   UNKNOWN   levothyroxine (SYNTHROID, LEVOTHROID) 75 MCG tablet Take 75 mcg by mouth daily before breakfast.   02/08/2023   pantoprazole (PROTONIX) 40 MG tablet Take 40 mg by mouth daily.  02/07/2023   Propylene Glycol (SYSTANE COMPLETE) 0.6 % SOLN Place 1 drop into both eyes as needed (dry eyes).   UNKNOWN   silodosin (RAPAFLO) 8 MG CAPS capsule Take 1 capsule (8 mg total) by mouth at bedtime. 30 capsule 11 02/08/2023   sulfamethoxazole-trimethoprim (BACTRIM DS) 800-160 MG tablet Take 1 tablet by mouth once. (Patient not taking: Reported on 02/08/2023)   Completed Course      Exam: Current vital signs: BP 127/78 (BP Location: Right Arm)   Pulse 64   Temp 97.8 F (36.6 C) (Oral)   Resp 18   Ht 6\' 5"  (1.956 m)   Wt 92.6 kg   SpO2 99%   BMI 24.21 kg/m  Vital signs in last 24  hours: Temp:  [97.6 F (36.4 C)-98.4 F (36.9 C)] 97.8 F (36.6 C) (05/30 1415) Pulse Rate:  [53-64] 64 (05/30 1415) Resp:  [16-20] 18 (05/30 1415) BP: (127-165)/(78-97) 127/78 (05/30 1415) SpO2:  [96 %-99 %] 99 % (05/30 1415) Weight:  [92.6 kg] 92.6 kg (05/29 1721)   Physical Exam  Constitutional: Appears well-developed and well-nourished.  Psych: Affect appropriate to situation Neuro: AOx3, no aphasia, cranial nerves II to XII grossly intact, antigravity strength without drift in all 4 extremities, sensory intact to light touch, FTN intact bilaterally   I have reviewed labs in epic and the results pertinent to this consultation are: CBC:  Recent Labs  Lab 02/08/23 1247  WBC 7.6  NEUTROABS 5.5  HGB 14.5  HCT 41.8  MCV 86.4  PLT 255    Basic Metabolic Panel:  Lab Results  Component Value Date   NA 134 (L) 02/08/2023   K 3.4 (L) 02/08/2023   CO2 19 (L) 02/08/2023   GLUCOSE 98 02/08/2023   BUN 20 02/08/2023   CREATININE 0.96 02/08/2023   CALCIUM 9.5 02/08/2023   GFRNONAA >60 02/08/2023   GFRAA >60 10/29/2017   Lipid Panel: No results found for: "LDLCALC" HgbA1c: No results found for: "HGBA1C" Urine Drug Screen:     Component Value Date/Time   LABOPIA NONE DETECTED 02/08/2023 1220   COCAINSCRNUR NONE DETECTED 02/08/2023 1220   LABBENZ NONE DETECTED 02/08/2023 1220   AMPHETMU NONE DETECTED 02/08/2023 1220   THCU NONE DETECTED 02/08/2023 1220   LABBARB NONE DETECTED 02/08/2023 1220    Alcohol Level No results found for: "ETH"   I have reviewed the images obtained:  MRI Brain without contrast 02/08/2023: No acute abnormality  ASSESSMENT/PLAN: 77 year old male presented with transient alteration of awareness which has since resolved.  Transient global amnesia -MRI brain did not show any acute abnormality - Routine EEG did not show any ictal-interictal abnormality -Toxic metabolic workup within normal limits except mildly elevated TSH ( 5.2) and borderline  B12 ( 423), thiamine pending.  CK normal, UDS normal, no UTI, no leukocytosis, sodium 134 potassium 3.4  Recommendations: -Does not need any additional workup from neurology standpoint. -Discussed diagnosis with patient -Recommend follow-up with neurology in 4 weeks (order placed) -Discussed plan with Dr. Mariea Clonts via secure chat  Thank you for allowing [place] to participate in the care of this patient. If you have any further questions, please contact  me or neurohospitalist.   Lindie Spruce Epilepsy [facility] neurohospitalist     .[email or web address]

## 2023-02-09 NOTE — Discharge Instructions (Addendum)
1)Please keep your appointment with Urologist Dr. Ronne Binning on 02/10/2023 to discuss the prostate concerns and possible adjustment of your prostate medications 2)Please keep your outpatient appointment with neurologist at [facility] in about 4 weeks from now as scheduled 3) please follow-up with primary care physician as previously scheduled in 3 to 4 weeks for reevaluation---- 4) your levothyroxine/thyroid medicine has been adjusted up slightly-- repeat TSH/thyroid test in about 6 to 8 weeks advised

## 2023-02-09 NOTE — TOC CM/SW Note (Signed)
Transition of Care [facility]) - Inpatient Brief Assessment   Patient Details  Name: Micheal Baker MRN: [id number] Date of Birth: July 12, 1946  Transition of [facility]) CM/SW Contact:    Elliot Gault, LCSW Phone Number: 02/09/2023, 3:15 PM   Clinical Narrative:  Transition of Care Department [facility]) has reviewed patient and no TOC needs have been identified at this time. We will continue to monitor patient advancement through interdisciplinary progression rounds. If new patient transition needs arise, please place a TOC consult.  Transition of Care Asessment: Insurance and Status: Insurance coverage has been reviewed Patient has primary care physician: Yes Home environment has been reviewed: from home alone Prior level of function:: independent Prior/Current Home Services: No current home services Social Determinants of Health Reivew: SDOH reviewed no interventions necessary Readmission risk has been reviewed: Yes Transition of care needs: no transition of care needs at this time

## 2023-02-09 NOTE — Patient Outreach (Signed)
  Care Coordination   Follow Up Visit Note   02/03/23 Name: Micheal Baker MRN: [id number] DOB: [birth year]  Micheal Baker is a 77 y.o. year old male who sees Micheal Found, MD for primary care. I spoke with  Micheal Baker by phone today.  What matters to the patients health and wellness today?  PET scan results    Goals Addressed             This Visit's Progress    Follow-up on Lung Nodule   On track    Care Coordination Goals: Patient will keep all scheduled medical and imaging appointments Patient will follow-up with pulmonologist as recommended Patient will state understanding of current imaging results and upcoming procedure for a lung biopsy Awaiting PET scan results. Patient would like to review them with someone prior to seeing the results on My Chart Patient will talk with [facility] re: any care coordination or resource needs [phone]         SDOH assessments and interventions completed:  No     Care Coordination Interventions:  Yes, provided  Interventions Today    Flowsheet Row Most Recent Value  Chronic Disease   Chronic disease during today's visit Other  [Lung nodule]  General Interventions   General Interventions Discussed/Reviewed General Interventions Discussed, General Interventions Reviewed  [reviewed chart for PET scan results. Results are not available at this time. May not be available to discuss until after biopsy]  Doctor Visits Discussed/Reviewed Specialist, Doctor Visits Discussed, Doctor Visits Reviewed  Education Interventions   Provided Verbal Education On When to see the doctor, Mental Health/Coping with Illness  Mental Health Interventions   Mental Health Discussed/Reviewed Mental Health Discussed, Mental Health Reviewed, Coping Strategies, Anxiety       Follow up plan: Follow up call scheduled for 03/02/23    Encounter Outcome:  Pt. Visit Completed   Micheal Baker, BSN, RN-BC RN Care Coordinator [facility]  [facility]  Network Direct Dial: [phone] Main #: [phone]

## 2023-02-09 NOTE — Care Management Obs Status (Signed)
MEDICARE OBSERVATION STATUS NOTIFICATION   Patient Details  Name: Micheal Baker MRN: [id number] Date of Birth: 09-30-1945   Medicare Observation Status Notification Given:  Yes    Corey Harold 02/09/2023, 3:29 PM

## 2023-02-09 NOTE — Progress Notes (Signed)
EEG complete - results pending 

## 2023-02-09 NOTE — Progress Notes (Signed)
Patient discharged home with instructions given on medications and follow up visits,verbalized understanding .Prescriptions sent to Pharmacy of choice documented on AVS.IV discontinued,catheter intact. Accompanied by staff to an awaiting vehicle.

## 2023-02-09 NOTE — Discharge Summary (Signed)
Micheal Baker, is a 77 y.o. male  DOB 14-Oct-1945  MRN [id number].  Admission date:  02/08/2023  Admitting Physician  Onnie Boer, MD  Discharge Date:  02/09/2023   Primary MD  [name], MD  Recommendations for primary care physician for things to follow:   1)Please keep your appointment with Urologist Dr. Ronne Binning on 02/10/2023 to discuss the prostate concerns and possible adjustment of your prostate medications 2)Please keep your outpatient appointment with neurologist at [facility] in about 4 weeks from now as scheduled 3) please follow-up with primary care physician as previously scheduled in 3 to 4 weeks for reevaluation---- 4) your levothyroxine/thyroid medicine has been adjusted up slightly-- repeat TSH/thyroid test in about 6 to 8 weeks advised  Admission Diagnosis  Amnesia [R41.3]   Discharge Diagnosis  Amnesia [R41.3]    Principal Problem:   Amnesia Active Problems:   BPH (benign prostatic hyperplasia)   Anxiety and depression      Past Medical History:  Diagnosis Date   Acute medial meniscus tear of left knee    Depression    Frequent PVCs    GERD (gastroesophageal reflux disease)    History of kidney stones    Hypertension    Hypothyroidism    NSVT (nonsustained ventricular tachycardia) (HCC)    Osteoarthritis    Sleep apnea    Thyroid disease     Past Surgical History:  Procedure Laterality Date   BIOPSY  12/01/2022   Procedure: BIOPSY;  Surgeon: Lanelle Bal, DO;  [facility];  Service: Endoscopy;;   CATARACT EXTRACTION     left   COLONOSCOPY N/A 04/08/2016   Surgeon: West Bali, MD; one 6 mm polyp in the cecum removed, one 4 mm polyp in the descending colon removed, nonbleeding internal hemorrhoids.  Pathology with hyperplastic polyps.  Recommended colonoscopy in 10 years.   COLONOSCOPY  12/01/2022   COLONOSCOPY WITH  PROPOFOL N/A 12/01/2022   Procedure: COLONOSCOPY WITH PROPOFOL;  Surgeon: Lanelle Bal, DO;  [facility];  Service: Endoscopy;  Laterality: N/A;  8:15 am, asa 3   endosocopy  12/01/2022   ESOPHAGOGASTRODUODENOSCOPY (EGD) WITH PROPOFOL N/A 12/01/2022   Procedure: ESOPHAGOGASTRODUODENOSCOPY (EGD) WITH PROPOFOL;  Surgeon: Lanelle Bal, DO;  [facility];  Service: Endoscopy;  Laterality: N/A;   EYE SURGERY     left-scar tissue   groin surgery Right 2010   growth removed    KNEE ARTHROSCOPY     left   KNEE ARTHROSCOPY WITH LATERAL MENISECTOMY Right 03/11/2015   Procedure: RIGHT KNEE ARTHROSCOPY WITH MEDIALAND LATERAL MENISECTOMY;  Surgeon: Salvatore Marvel, MD;  Location: [facility];  Service: Orthopedics;  Laterality: Right;   KNEE ARTHROSCOPY WITH MEDIAL MENISECTOMY Left 06/27/2014   Procedure: LEFT KNEE ARTHROSCOPY WITH PARTIAL MEDIAL AND LATERAL MENISECTOMIES AND CHONDROPLASTY;  Surgeon: Nilda Simmer, MD;  Location: [facility];  Service: Orthopedics;  Laterality: Left;   KNEE ARTHROSCOPY WITH MEDIAL MENISECTOMY Right 03/11/2015  Procedure: KNEE ARTHROSCOPY WITH MEDIAL MENISECTOMY;  Surgeon: Salvatore Marvel, MD;  Location: [facility];  Service: Orthopedics;  Laterality: Right;   LEFT HEART CATHETERIZATION WITH CORONARY ANGIOGRAM N/A 09/14/2011   Procedure: LEFT HEART CATHETERIZATION WITH CORONARY ANGIOGRAM;  Surgeon: [name], MD;  Location: [facility];  Service: Cardiovascular;  Laterality: N/A;   POLYPECTOMY  04/08/2016   Procedure: POLYPECTOMY;  Surgeon: West Bali, MD;  [facility];  Service: Endoscopy;;  colon    POLYPECTOMY  12/01/2022   POLYPECTOMY  12/01/2022   Procedure: POLYPECTOMY;  Surgeon: Lanelle Bal, DO;  [facility];  Service: Endoscopy;;   RETINAL DETACHMENT SURGERY Left 2002   Dr. Hermina Barters       HPI  from the history and physical done on the day of  admission:   Chief Complaint: Memory Loss   HPI: [name] is a 77 y.o. male with medical history significant for BPH, HTN, OSA.  Patient drove himself to the ED.  On arrival to the ED he did not remember the events of today, or why he came here, he does not remember getting in his car or why he drove to the ED, does not remember having breakfast.  His daughter called talked to him on the phone at about 9:30 AM in the morning, he does not remember this conversation.  He does remember getting a bronchoscopy yesterday at [facility].  At the time of my evaluation, he is remembering a few things like his medical history of retinal detachment, but still has no recollection of the above mentioned events.  Patient lives alone. Reports some mild frontal headache.  No history of seizures no history of strokes. No weakness of his extremities.    ED Course: Temperature 97.6.  Heart rate 54-59.  Respirate rate 16-18.  Blood pressure systolic 140s to 161W.  O2 sats greater than 95% on room air. Brain MRI-no acute intracranial process. Neurology was consulted, possible transient global amnesia, possible witnessed seizure.  Recommended EEG.  If further events obtain brain MRI with contrast for completeness.   Review of Systems: As per HPI all other systems reviewed and negative.   Hospital Course:     Assessment and Plan: Transient global amnesia -Neurology consult and input appreciated -MRI brain and routine EEG without acute findings - Patient underwent general endotracheal anesthesia  02/07/23 for flexible video fiberoptic bronchoscopy with robotic assistance on biopsies.  Tolerated procedure well had no complications.  Per procedure notes- He underwent endotracheal intubation received Ondansetron, propofol infusion, TIVA (?? total Intravenous anesthesia) ---Toxic metabolic workup within normal limits except mildly elevated TSH ( 5.2) and borderline B12 ( 423), thiamine pending.  CK normal, UDS  normal, no UTI, no leukocytosis, sodium 134 potassium 3.4  -No lingering neurodeficits or concerns at this time  -Elevated BP -No BP meds PTA -BP stable at this time  Anxiety and depression C/n bupropion, Lexapro  BPH (benign prostatic hyperplasia) -Continue PTA Rapaflo and Proscar  Mild hyponatremia/mild hypokalemia-  replaced and hydrated  Non-small cell lung cancer- as per Bronchoscopic biopsy from 02/07/2023- -patient states that he already has follow-up appointment/remote visit with Dr. Tonia Brooms for Tuesday, 02/14/2023 -Patient will need outpatient follow-up with oncology and pulmonology   Discharge Condition: stable  Follow UP   Follow-up Information     Micki Riley, MD. Call in 1 month(s).   Specialties: Neurology, Radiology Contact information: [address] [place] [address] [phone]  Consults obtained -neurology  Diet and Activity recommendation:  As advised  Discharge Instructions    Discharge Instructions     Ambulatory referral to Neurology   Complete by: As directed    An appointment is requested in approximately: 4 weeks   Call MD for:  difficulty breathing, headache or visual disturbances   Complete by: As directed    Call MD for:  persistant dizziness or light-headedness   Complete by: As directed    Call MD for:  persistant nausea and vomiting   Complete by: As directed    Call MD for:  temperature >100.4   Complete by: As directed    Diet - low sodium heart healthy   Complete by: As directed    Discharge instructions   Complete by: As directed    1)Please keep your appointment with Urologist Dr. Ronne Binning on 02/10/2023 to discuss the prostate concerns and possible adjustment of your prostate medications 2)Please keep your outpatient appointment with neurologist at [facility] in about 4 weeks from now as scheduled 3) please follow-up with primary care physician as previously  scheduled in 3 to 4 weeks for reevaluation---- 4) your levothyroxine/thyroid medicine has been adjusted up slightly-- repeat TSH/thyroid test in about 6 to 8 weeks advised   Increase activity slowly   Complete by: As directed        Discharge Medications     Allergies as of 02/09/2023       Reactions   Avelox [moxifloxacin] Other (See Comments)   Caused tendonitis   Penicillins Other (See Comments)   Chills        Medication List     STOP taking these medications    sulfamethoxazole-trimethoprim 800-160 MG tablet Commonly known as: BACTRIM DS       TAKE these medications    acetaminophen 500 MG tablet Commonly known as: TYLENOL Take 500-1,000 mg by mouth every 6 (six) hours as needed for mild pain, headache or moderate pain.   ALPHA LIPOIC ACID PO Take 1 capsule by mouth in the morning.   B-complex with vitamin C tablet Take 1 tablet by mouth in the morning.   buPROPion 150 MG 24 hr tablet Commonly known as: WELLBUTRIN XL Take 150 mg by mouth daily.   calcium carbonate 500 MG chewable tablet Commonly known as: TUMS - dosed in mg elemental calcium Chew 1 tablet (200 mg of elemental calcium total) by mouth 3 (three) times daily with meals.   doxylamine (Sleep) 25 MG tablet Commonly known as: UNISOM Take 25 mg by mouth at bedtime as needed for sleep.   escitalopram 20 MG tablet Commonly known as: LEXAPRO Take 20 mg by mouth daily.   finasteride 5 MG tablet Commonly known as: PROSCAR Take 1 tablet (5 mg total) by mouth daily.   fluticasone 50 MCG/ACT nasal spray Commonly known as: FLONASE Place 1 spray into both nostrils daily as needed for allergies.   levothyroxine 88 MCG tablet Commonly known as: Synthroid Take 1 tablet (88 mcg total) by mouth daily before breakfast. What changed:  medication strength how much to take   pantoprazole 40 MG tablet Commonly known as: PROTONIX Take 40 mg by mouth daily.   silodosin 8 MG Caps capsule Commonly  known as: RAPAFLO Take 1 capsule (8 mg total) by mouth at bedtime.   Systane Complete 0.6 % Soln Generic drug: Propylene Glycol Place 1 drop into both eyes as needed (dry eyes).   Voltaren 1 % Gel Generic drug: diclofenac  Sodium Apply 1 Application topically 4 (four) times daily as needed (knee pain).        Major procedures and Radiology Reports - PLEASE review detailed and final reports for all details, in brief -   EEG adult  Result Date: 02/09/2023 Charlsie Quest, MD     02/09/2023  2:31 PM Patient Name: HARDEEP CHESS MRN: [id number] Epilepsy Attending: Charlsie Quest Referring Physician/Provider: Onnie Boer, MD Date: 02/09/2023 Duration: 23.07 mins Patient history: 77 year old male with transient alteration of awareness.  EEG to evaluate for seizure. Level of alertness: Awake, asleep AEDs during EEG study: None Technical aspects: This EEG study was done with scalp electrodes positioned according to the 10-20 International system of electrode placement. Electrical activity was reviewed with band pass filter of 1-70Hz , sensitivity of 7 uV/mm, display speed of 68mm/sec with a 60Hz  notched filter applied as appropriate. EEG data were recorded continuously and digitally stored.  Video monitoring was available and reviewed as appropriate. Description: The posterior dominant rhythm consists of 9 Hz activity of moderate voltage (25-35 uV) seen predominantly in posterior head regions, symmetric and reactive to eye opening and eye closing. Sleep was characterized by vertex waves, sleep spindles (12 to 14 Hz), maximal frontocentral region.  Hyperventilation and photic stimulation were not performed.   IMPRESSION: This study is within normal limits. No seizures or epileptiform discharges were seen throughout the recording. A normal interictal EEG does not exclude the diagnosis of epilepsy. Charlsie Quest   MR BRAIN WO CONTRAST  Result Date: 02/08/2023 CLINICAL DATA:  Altered mental  status EXAM: MRI HEAD WITHOUT CONTRAST TECHNIQUE: Multiplanar, multiecho pulse sequences of the brain and surrounding structures were obtained without intravenous contrast. COMPARISON:  MR Head 05/12/21 FINDINGS: Brain: Negative for an acute infarct. No hydrocephalus. No acute hemorrhage. No extra-axial fluid collection. Sequela of mild chronic microvascular ischemic change. Vascular: Normal flow voids. Skull and upper cervical spine: Normal marrow signal. Sinuses/Orbits: Middle ear or mastoid effusion. Mild mucosal thickening bilateral ethmoid sinuses in the left maxillary sinus. Left lens replacement. Orbits are otherwise unremarkable. Other: None. IMPRESSION: No acute intracranial process. Electronically Signed   By: Lorenza Cambridge M.D.   On: 02/08/2023 13:46   NM PET Image Initial (PI) Skull Base To Thigh (F-18 FDG)  Result Date: 02/08/2023 CLINICAL DATA:  Initial treatment strategy for lung cancer. 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 EXAM: NUCLEAR MEDICINE PET SKULL BASE TO THIGH TECHNIQUE: 11.61 mCi F-18 FDG was injected intravenously. Full-ring PET imaging was performed from the skull base to thigh after the radiotracer. CT data was obtained and used for attenuation correction and anatomic localization. Fasting blood glucose: 100 mg/dl COMPARISON:  Chest CT 14/78/2956.  Abdominal CT 01/30/2022. FINDINGS: Mediastinal blood pool activity: SUV max 2.2 NECK: No hypermetabolic cervical lymph nodes are identified.Fairly symmetric activity within the lymphoid tissue of Waldeyer's ring is within physiologic limits.No suspicious activity identified within the pharyngeal mucosal space. Incidental CT findings: none CHEST: There are no hypermetabolic mediastinal, hilar or axillary lymph nodes. The enlarging left lower lobe pulmonary nodule is mildly hypermetabolic for size (SUV max 2.2). No other hypermetabolic pulmonary activity or suspicious nodularity. Incidental CT findings: Multiple  calcified mediastinal and hilar lymph nodes are again noted consistent with prior granulomatous disease. Atherosclerosis of the aorta and coronary arteries. There is a small hiatal hernia. Mild centrilobular emphysema with scattered calcified pulmonary granulomas. ABDOMEN/PELVIS: There is no hypermetabolic activity within the liver, adrenal glands, spleen or pancreas. There  is no hypermetabolic nodal activity in the abdomen or pelvis. The prostate gland is moderately enlarged and demonstrates prominent focal hypermetabolic activity peripherally in the right lobe (SUV max 8.8). Incidental CT findings: Scattered calcified granulomas, a stable right hepatic cyst and atherosclerosis of the aorta. SKELETON: There is no hypermetabolic activity to suggest osseous metastatic disease. Incidental CT findings: none IMPRESSION: 1. The enlarging left lower lobe pulmonary nodule is mildly hypermetabolic for size, suspicious for bronchogenic carcinoma. Correlate with pending biopsy results. 2. No evidence of metastatic disease. 3. Sequela of prior granulomatous disease. 4. Prostatomegaly with prominent focal hypermetabolic activity peripherally in the right lobe. This could reflect focal prostatitis or prostate malignancy. Correlate with serum PSA levels. 5. Aortic Atherosclerosis (ICD10-I70.0) and Emphysema (ICD10-J43.9). Electronically Signed   By: Carey Bullocks M.D.   On: 02/08/2023 11:38   DG Chest Port 1 View  Result Date: 02/07/2023 CLINICAL DATA:  Status post bronchoscopy EXAM: PORTABLE CHEST 1 VIEW COMPARISON:  None Available. FINDINGS: Low volume AP portable examination. Mild cardiomegaly. Biopsy marking clip projects over the left lung base. Both lungs are clear. The visualized skeletal structures are unremarkable. IMPRESSION: 1. Mild cardiomegaly without acute abnormality of the lungs in low volume AP portable examination. 2.  Biopsy marking clip projects over the left lung base. Electronically Signed   By: Jearld Lesch M.D.   On: 02/07/2023 11:22   DG C-ARM BRONCHOSCOPY  Result Date: 02/07/2023 C-ARM BRONCHOSCOPY: Fluoroscopy was utilized by the requesting physician.  No radiographic interpretation.   CT Super D Chest Wo Contrast  Result Date: 01/15/2023 CLINICAL DATA:  Follow-up pulmonary nodules EXAM: CT CHEST WITHOUT CONTRAST TECHNIQUE: Multidetector CT imaging of the chest was performed using thin slice collimation for electromagnetic bronchoscopy planning purposes, without intravenous contrast. RADIATION DOSE REDUCTION: This exam was performed according to the departmental dose-optimization program which includes automated exposure control, adjustment of the mA and/or kV according to patient size and/or use of iterative reconstruction technique. COMPARISON:  Partial comparison to CT abdomen/pelvis dated 01/30/2022 FINDINGS: Cardiovascular: The heart is normal in size. No pericardial effusion. No evidence of thoracic aortic aneurysm. Mild atherosclerotic calcifications of the aortic arch. Moderate three-vessel coronary atherosclerosis. Mediastinum/Nodes: Calcified mediastinal and bilateral perihilar nodes, benign. Visualized thyroid is unremarkable. Lungs/Pleura: Biapical pleural-parenchymal scarring. Mild centrilobular emphysematous changes, upper lung predominant. Scattered calcified granulomata in the lungs bilaterally, benign. 9 x 8 mm irregular noncalcified nodule in the posterior left lower lobe (series 4/image 19), previously 6 mm, suspicious for early primary bronchogenic neoplasm. Associated pleural retraction. No pleural effusion or pneumothorax. Upper Abdomen: Visualized upper abdomen is notable for calcified splenic granulomata, mild vascular calcifications, and a posterior right hepatic cyst (incompletely visualized). Musculoskeletal: Mild degenerative changes of the mid/lower thoracic spine. IMPRESSION: 9 x 8 mm irregular noncalcified nodule in the posterior left lower lobe, progressive with  suspected pleural involvement, suspicious for early primary bronchogenic neoplasm. Discussion at multidisciplinary tumor board is suggested. Consider PET-CT as clinically warranted. Aortic Atherosclerosis (ICD10-I70.0) and Emphysema (ICD10-J43.9). Electronically Signed   By: Charline Bills M.D.   On: 01/15/2023 02:08    Micro Results   Recent Results (from the past 240 hour(s))  Novel Coronavirus, NAA ([facility])     Status: None   Collection Time: 02/03/23 10:19 AM   Specimen: Nasopharyngeal(NP) swabs in vial transport medium   Nasopharynge  Previous  Result Value Ref Range Status   SARS-CoV-2, NAA Not Detected Not Detected Final    Comment: This nucleic acid amplification test was developed and  its performance characteristics determined by [employer]. Nucleic acid amplification tests include RT-PCR and TMA. This test has not been FDA cleared or approved. This test has been authorized by FDA under an Emergency Use Authorization (EUA). This test is only authorized for the duration of time the declaration that circumstances exist justifying the authorization of the emergency use of in vitro diagnostic tests for detection of SARS-CoV-2 virus and/or diagnosis of COVID-19 infection under section 564(b)(1) of the Act, 21 U.S.C. 914NWG-9(F) (1), unless the authorization is terminated or revoked sooner. When diagnostic testing is negative, the possibility of a false negative result should be considered in the context of a patient's recent exposures and the presence of clinical signs and symptoms consistent with COVID-19. An individual without symptoms of COVID-19 and who is not shedding SARS-CoV-2 virus wo uld expect to have a negative (not detected) result in this assay.     Today   Subjective    Orinda Kenner today has no new complaints - No-further neuro concerns   Patient has been seen and examined prior to discharge   Objective   Blood pressure 127/78, pulse 64,  temperature 97.8 F (36.6 C), temperature source Oral, resp. rate 18, height 6\' 5"  (1.956 m), weight 92.6 kg, SpO2 99 %.  No intake or output data in the 24 hours ending 02/09/23 1612  Exam Gen:- Awake Alert, no acute distress  HEENT:- [place].AT, No sclera icterus Neck-Supple Neck,No JVD,.  Lungs-  CTAB , good air movement bilaterally CV- S1, S2 normal, regular Abd-  +ve B.Sounds, Abd Soft, No tenderness,    Extremity/Skin:- No  edema,   good pulses Psych-affect is appropriate, oriented x3, no memory or cognitive concerns Neuro-no new focal deficits, no tremors    Data Review   CBC w Diff:  Lab Results  Component Value Date   WBC 7.6 02/08/2023   HGB 14.5 02/08/2023   HGB 15.0 01/09/2023   HCT 41.8 02/08/2023   HCT 44.3 01/09/2023   PLT 255 02/08/2023   LYMPHOPCT 18 02/08/2023   MONOPCT 9 02/08/2023   EOSPCT 1 02/08/2023   BASOPCT 0 02/08/2023    CMP:  Lab Results  Component Value Date   NA 134 (L) 02/08/2023   NA 140 01/09/2023   K 3.4 (L) 02/08/2023   CL 100 02/08/2023   CO2 19 (L) 02/08/2023   BUN 20 02/08/2023   BUN 14 01/09/2023   CREATININE 0.96 02/08/2023   CREATININE 1.04 01/30/2015   PROT 7.2 02/08/2023   ALBUMIN 4.1 02/08/2023   BILITOT 1.1 02/08/2023   ALKPHOS 52 02/08/2023   AST 32 02/08/2023   ALT 33 02/08/2023  .  Total Discharge time is about 33 minutes  Shon Hale M.D on 02/09/2023 at 4:12 PM  Go to [email or web address] -  for contact info  [facility] - Office  [phone]

## 2023-02-10 ENCOUNTER — Encounter: Payer: Self-pay | Admitting: Urology

## 2023-02-10 ENCOUNTER — Ambulatory Visit: Payer: Medicare PPO | Admitting: Urology

## 2023-02-10 VITALS — BP 143/79 | HR 87

## 2023-02-10 DIAGNOSIS — N401 Enlarged prostate with lower urinary tract symptoms: Secondary | ICD-10-CM

## 2023-02-10 DIAGNOSIS — R339 Retention of urine, unspecified: Secondary | ICD-10-CM

## 2023-02-10 DIAGNOSIS — N138 Other obstructive and reflux uropathy: Secondary | ICD-10-CM

## 2023-02-10 LAB — URINALYSIS, ROUTINE W REFLEX MICROSCOPIC
Bilirubin, UA: NEGATIVE
Glucose, UA: NEGATIVE
Nitrite, UA: NEGATIVE
RBC, UA: NEGATIVE
Specific Gravity, UA: 1.01 (ref 1.005–1.030)
Urobilinogen, Ur: 2 mg/dL — ABNORMAL HIGH (ref 0.2–1.0)
pH, UA: 7 (ref 5.0–7.5)

## 2023-02-10 LAB — MICROSCOPIC EXAMINATION: Bacteria, UA: NONE SEEN

## 2023-02-10 LAB — BLADDER SCAN AMB NON-IMAGING

## 2023-02-10 MED ORDER — SILODOSIN 8 MG PO CAPS
8.0000 mg | ORAL_CAPSULE | Freq: Two times a day (BID) | ORAL | 11 refills | Status: DC
Start: 2023-02-10 — End: 2023-04-07

## 2023-02-10 NOTE — Patient Instructions (Signed)

## 2023-02-10 NOTE — Progress Notes (Signed)
02/10/2023 11:48 AM   Micheal Baker [birth year] [id number]  Referring provider: [name], MD [address] [place],  [place] [address]  Chief Complaint  Patient presents with   Urinary Retention    HPI: Micheal Baker is a 77yo here for followup for BPH with urinary retention. PVR 10cc. IPSS 22 QOL 5 on rapaflo 8mg  daily. He was admitted for amnesia. Urine stream strong. He has intermittent straining to urinate. No he nocturia 3-4x. He has urinary hesitancy.    PMH: Past Medical History:  Diagnosis Date   Acute medial meniscus tear of left knee    Depression    Frequent PVCs    GERD (gastroesophageal reflux disease)    History of kidney stones    Hypertension    Hypothyroidism    NSVT (nonsustained ventricular tachycardia) (HCC)    Osteoarthritis    Sleep apnea    Thyroid disease     Surgical History: Past Surgical History:  Procedure Laterality Date   BIOPSY  12/01/2022   Procedure: BIOPSY;  Surgeon: Lanelle Bal, DO;  [facility];  Service: Endoscopy;;   CATARACT EXTRACTION     left   COLONOSCOPY N/A 04/08/2016   Surgeon: West Bali, MD; one 6 mm polyp in the cecum removed, one 4 mm polyp in the descending colon removed, nonbleeding internal hemorrhoids.  Pathology with hyperplastic polyps.  Recommended colonoscopy in 10 years.   COLONOSCOPY  12/01/2022   COLONOSCOPY WITH PROPOFOL N/A 12/01/2022   Procedure: COLONOSCOPY WITH PROPOFOL;  Surgeon: Lanelle Bal, DO;  [facility]: [facility];  Service: Endoscopy;  Laterality: N/A;  8:15 am, asa 3   endosocopy  12/01/2022   ESOPHAGOGASTRODUODENOSCOPY (EGD) WITH PROPOFOL N/A 12/01/2022   Procedure: ESOPHAGOGASTRODUODENOSCOPY (EGD) WITH PROPOFOL;  Surgeon: Lanelle Bal, DO;  [facility];  Service: Endoscopy;  Laterality: N/A;   EYE SURGERY     left-scar tissue   groin surgery Right 2010   growth removed    KNEE ARTHROSCOPY     left   KNEE ARTHROSCOPY WITH LATERAL MENISECTOMY  Right 03/11/2015   Procedure: RIGHT KNEE ARTHROSCOPY WITH MEDIALAND LATERAL MENISECTOMY;  Surgeon: Salvatore Marvel, MD;  Location: [facility];  Service: Orthopedics;  Laterality: Right;   KNEE ARTHROSCOPY WITH MEDIAL MENISECTOMY Left 06/27/2014   Procedure: LEFT KNEE ARTHROSCOPY WITH PARTIAL MEDIAL AND LATERAL MENISECTOMIES AND CHONDROPLASTY;  Surgeon: Nilda Simmer, MD;  Location: [facility];  Service: Orthopedics;  Laterality: Left;   KNEE ARTHROSCOPY WITH MEDIAL MENISECTOMY Right 03/11/2015   Procedure: KNEE ARTHROSCOPY WITH MEDIAL MENISECTOMY;  Surgeon: Salvatore Marvel, MD;  Location: [facility];  Service: Orthopedics;  Laterality: Right;   LEFT HEART CATHETERIZATION WITH CORONARY ANGIOGRAM N/A 09/14/2011   Procedure: LEFT HEART CATHETERIZATION WITH CORONARY ANGIOGRAM;  Surgeon: [name], MD;  Location: [facility];  Service: Cardiovascular;  Laterality: N/A;   POLYPECTOMY  04/08/2016   Procedure: POLYPECTOMY;  Surgeon: West Bali, MD;  [facility];  Service: Endoscopy;;  colon    POLYPECTOMY  12/01/2022   POLYPECTOMY  12/01/2022   Procedure: POLYPECTOMY;  Surgeon: Lanelle Bal, DO;  [facility]: [facility];  Service: Endoscopy;;   RETINAL DETACHMENT SURGERY Left 2002   Dr. Hermina Barters    Home Medications:  Allergies as of 02/10/2023       Reactions   Avelox [moxifloxacin] Other (See Comments)   Caused tendonitis   Penicillins Other (See Comments)  Chills        Medication List        Accurate as of Feb 10, 2023 11:48 AM. If you have any questions, ask your nurse or doctor.          acetaminophen 500 MG tablet Commonly known as: TYLENOL Take 500-1,000 mg by mouth every 6 (six) hours as needed for mild pain, headache or moderate pain.   ALPHA LIPOIC ACID PO Take 1 capsule by mouth in the morning.   B-complex with vitamin C tablet Take 1 tablet by mouth in the morning.   buPROPion 150 MG 24 hr  tablet Commonly known as: WELLBUTRIN XL Take 150 mg by mouth daily.   calcium carbonate 500 MG chewable tablet Commonly known as: TUMS - dosed in mg elemental calcium Chew 1 tablet (200 mg of elemental calcium total) by mouth 3 (three) times daily with meals.   doxylamine (Sleep) 25 MG tablet Commonly known as: UNISOM Take 25 mg by mouth at bedtime as needed for sleep.   escitalopram 20 MG tablet Commonly known as: LEXAPRO Take 20 mg by mouth daily.   finasteride 5 MG tablet Commonly known as: PROSCAR Take 1 tablet (5 mg total) by mouth daily.   fluticasone 50 MCG/ACT nasal spray Commonly known as: FLONASE Place 1 spray into both nostrils daily as needed for allergies.   levothyroxine 88 MCG tablet Commonly known as: Synthroid Take 1 tablet (88 mcg total) by mouth daily before breakfast.   pantoprazole 40 MG tablet Commonly known as: PROTONIX Take 40 mg by mouth daily.   silodosin 8 MG Caps capsule Commonly known as: RAPAFLO Take 1 capsule (8 mg total) by mouth at bedtime.   Systane Complete 0.6 % Soln Generic drug: Propylene Glycol Place 1 drop into both eyes as needed (dry eyes).   Voltaren 1 % Gel Generic drug: diclofenac Sodium Apply 1 Application topically 4 (four) times daily as needed (knee pain).        Allergies:  Allergies  Allergen Reactions   Avelox [Moxifloxacin] Other (See Comments)    Caused tendonitis   Penicillins Other (See Comments)    Chills     Family History: Family History  Problem Relation Age of Onset   Hypertension Mother    Hypertension Father    Heart attack Father    Diabetes Father    Asthma Daughter    Colon cancer Neg Hx    Stomach cancer Neg Hx     Social History:  reports that he has never smoked. He has never used smokeless tobacco. He reports current alcohol use. He reports that he does not use drugs.  ROS: All other review of systems were reviewed and are negative except what is noted above in  HPI  Physical Exam: BP (!) 143/79   Pulse 87   Constitutional:  Alert and oriented, No acute distress. HEENT: [place] AT, moist mucus membranes.  Trachea midline, no masses. Cardiovascular: No clubbing, cyanosis, or edema. Respiratory: Normal respiratory effort, no increased work of breathing. GI: Abdomen is soft, nontender, nondistended, no abdominal masses GU: No CVA tenderness.  Lymph: No cervical or inguinal lymphadenopathy. Skin: No rashes, bruises or suspicious lesions. Neurologic: Grossly intact, no focal deficits, moving all 4 extremities. Psychiatric: Normal mood and affect.  Laboratory Data: Lab Results  Component Value Date   WBC 7.6 02/08/2023   HGB 14.5 02/08/2023   HCT 41.8 02/08/2023   MCV 86.4 02/08/2023   PLT 255 02/08/2023    Lab  Results  Component Value Date   CREATININE 0.96 02/08/2023    No results found for: "PSA"  No results found for: "TESTOSTERONE"  No results found for: "HGBA1C"  Urinalysis    Component Value Date/Time   COLORURINE YELLOW 02/08/2023 1220   APPEARANCEUR HAZY (A) 02/08/2023 1220   APPEARANCEUR Clear 01/18/2023 1125   LABSPEC 1.009 02/08/2023 1220   PHURINE 6.0 02/08/2023 1220   GLUCOSEU NEGATIVE 02/08/2023 1220   HGBUR NEGATIVE 02/08/2023 1220   BILIRUBINUR NEGATIVE 02/08/2023 1220   BILIRUBINUR Negative 01/18/2023 1125   KETONESUR NEGATIVE 02/08/2023 1220   PROTEINUR NEGATIVE 02/08/2023 1220   UROBILINOGEN 0.2 08/08/2011 0800   NITRITE NEGATIVE 02/08/2023 1220   LEUKOCYTESUR NEGATIVE 02/08/2023 1220    Lab Results  Component Value Date   LABMICR See below: 01/18/2023   WBCUA 0-5 01/18/2023   LABEPIT 0-10 01/18/2023   MUCUS Present 02/15/2022   BACTERIA Few (A) 01/18/2023    Pertinent Imaging:  No results found for this or any previous visit.  No results found for this or any previous visit.  No results found for this or any previous visit.  No results found for this or any previous visit.  No results found  for this or any previous visit.  No valid procedures specified. No results found for this or any previous visit.  Results for orders placed during the hospital encounter of 01/30/22  CT Renal Stone Study  Narrative CLINICAL DATA:  Flank pain.  EXAM: CT ABDOMEN AND PELVIS WITHOUT CONTRAST  TECHNIQUE: Multidetector CT imaging of the abdomen and pelvis was performed following the standard protocol without IV contrast.  RADIATION DOSE REDUCTION: This exam was performed according to the departmental dose-optimization program which includes automated exposure control, adjustment of the mA and/or kV according to patient size and/or use of iterative reconstruction technique.  COMPARISON:  CT abdomen and pelvis 01/18/2007  FINDINGS: Lower chest: There is a new 6 mm nodule in the left lung base. Lung bases are otherwise clear. There are calcified granulomas in the lung bases.  Hepatobiliary: Calcified granulomas are present. There is a 3 cm cyst in the inferior right lobe of the liver which has slightly increased in size compared to the prior study. Gallbladder and bile ducts are within normal limits.  Pancreas: Unremarkable. No pancreatic ductal dilatation or surrounding inflammatory changes.  Spleen: Calcified granulomas are present. Otherwise within normal limits.  Adrenals/Urinary Tract: Foley catheter is seen within the bladder. No urinary tract calculus or hydronephrosis. Adrenal glands within normal limits.  Stomach/Bowel: Stomach is within normal limits. Appendix appears normal. No evidence of bowel wall thickening, distention, or inflammatory changes. Appendix is not seen. Small hiatal hernia and duodenal diverticulum again noted.  Vascular/Lymphatic: Aortic atherosclerosis. No enlarged abdominal or pelvic lymph nodes.  Reproductive: Prostate gland is enlarged and lobulated with nodular protrusion into the bladder base. Prostate measures 7.0 x 7.3 by 5.9 cm and  has significantly increased in size.  Other: No ascites.  Small fat containing inguinal hernias.  Musculoskeletal: No acute or significant osseous findings.  IMPRESSION: 1. No evidence for urinary tract calculus or hydronephrosis. 2. Foley catheter in the bladder. 3. Marked enlargement of the prostate gland with nodular protrusion into the bladder base, new from 2008. Recommend clinical correlation and follow-up to exclude neoplasm. 4. Hepatic cyst. 5. Old granulomatous disease. 6. 6 mm left solid pulmonary nodule. Recommend a non-contrast Chest CT at 6-12 months. If patient is high risk for malignancy, recommend an additional non-contrast Chest CT  at 18-24 months; if patient is low risk for malignancy a non-contrast Chest CT at 18-24 months is optional. These guidelines do not apply to immunocompromised patients and patients with cancer. Follow up in patients with significant comorbidities as clinically warranted. For lung cancer screening, adhere to Lung-RADS guidelines. Reference: Radiology. 2017; 284(1):228-43.   Electronically Signed By: Darliss Cheney M.D. On: 01/30/2022 16:15   Assessment & Plan:    1. Benign prostatic hyperplasia with urinary obstruction -Increase rapaflo to 8mg  BID - Urinalysis, Routine w reflex microscopic  2. Urinary retention -rapaflo 8mg  BID - BLADDER SCAN AMB NON-IMAGING   No follow-ups on file.  Wilkie Aye, MD  [facility] [place]

## 2023-02-12 ENCOUNTER — Encounter (HOSPITAL_COMMUNITY): Payer: Self-pay | Admitting: Pulmonary Disease

## 2023-02-13 LAB — VITAMIN B1: Vitamin B1 (Thiamine): 147.8 nmol/L (ref 66.5–200.0)

## 2023-02-14 ENCOUNTER — Telehealth (INDEPENDENT_AMBULATORY_CARE_PROVIDER_SITE_OTHER): Payer: Medicare PPO | Admitting: Acute Care

## 2023-02-14 ENCOUNTER — Encounter: Payer: Self-pay | Admitting: Acute Care

## 2023-02-14 DIAGNOSIS — C3490 Malignant neoplasm of unspecified part of unspecified bronchus or lung: Secondary | ICD-10-CM | POA: Diagnosis not present

## 2023-02-14 NOTE — Patient Instructions (Addendum)
It is good to see you today. 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  There was no indication of metastatic disease. There was notation of increased metabolic activity in the prostate. Please follow up with your urologist, and let him know your PET scan showed increased metabolic activity in the prostate gland. Recommendation is for PSA level.  I have referred you to radiation oncology and thoracic surgery to be evaluated for treatment options  You will get calls to get these scheduled.  I have also ordered PFT's  This is pulmonary function tests. You will get a call to get these scheduled.  Sips of water instead of throat clearing Sugar Free Coca-Cola or Werther's originals for throat soothing. Delsym Cough syrup 5 cc's every 12 hours Non-sedating antihistamine of your choice daily ( Zyrtec, Allegra, Xyzol, Claritin ( Generic ok)  Follow up as needed.  Please contact office for sooner follow up if symptoms do not improve or worsen or seek emergency care

## 2023-02-14 NOTE — Progress Notes (Signed)
Results reviewed with patient today in the office  Thanks,  BLI  Josephine Igo, DO [facility] 02/14/2023 4:00 PM

## 2023-02-14 NOTE — Progress Notes (Signed)
Virtual Visit via Video Note  I connected with Micheal Baker on 02/14/23 at 10:00 AM EDT by a video enabled telemedicine application and verified that I am speaking with the correct person using two identifiers.  Location: Patient:  At work Provider: [address], [place], [place], [address]    I discussed the limitations of evaluation and management by telemedicine and the availability of in person appointments. The patient expressed understanding and agreed to proceed.  History of Present Illness: 77 year old gentleman past medical history of nonsustained VT, hypothyroidism, hypertension, PVCs. Non-smoker his entire life. He tried a few cigarettes when he was a teenager. Never had any history of malignancy or solid organ tumor. No family history of lung cancer. Patient had a CT scan of the abdomen and pelvis completed 4 evaluation of possible kidney stone. In the left lower lobe base of the lung was found to have a 5 mm peripheral lung nodule. He was referred to Dr. Tonia Brooms 01/2023 for evaluation of this finding  Follow-up CT imaging does reveal that the nodule has increased in size now 8 mm in size. He also now has a little bit of a pleural tag concerning for potential underlying malignancy. He underwent Flexible video fiberoptic bronchoscopy with robotic assistance and biopsies on 02/07/2023. He states he has donbe well after the procedure. No bleeding , fever, discolored secretions . He does have a sore throat and cough. He is not doing anything for the cough.  We have discussed his biopsy results. They are positive for non-small cell lung cancer. We will refer to surgery and radiation for options as options for treatment . Pt. Is in agreement with this plan.  Patient has had ongoing elevated prostate antigen and multiple issues with sepsis after foley catheter insertions requiring hospitalization.He is following up with urology and  getting PSA's every 6 months. I have asked him to follow up  with Urology and get PSA now.    Observations/Objective: LUNG, LLL, FINE NEEDLE ASPIRATION:  - Malignant cells present, consistent with non-small cell carcinoma,   PET scan 02/02/2023  The enlarging left lower lobe pulmonary nodule is mildly hypermetabolic for size, suspicious for bronchogenic carcinoma. Correlate with pending biopsy results. 2. No evidence of metastatic disease. 3. Sequela of prior granulomatous disease. 4. Prostatomegaly with prominent focal hypermetabolic activity peripherally in the right lobe. This could reflect focal prostatitis or prostate malignancy. Correlate with serum PSA levels. 5. Aortic Atherosclerosis (ICD10-I70.0) and Emphysema (ICD10-J43.9).  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  There was no indication of metastatic disease. There was notation of increased metabolic activity in the prostate. Please follow up with your urologist, and let him know your PET scan showed increased metabolic activity in the prostate gland. Recommendation is for PSA level.  I have referred you to radiation oncology and thoracic surgery to be evaluated for treatment options  You will get calls to get these scheduled.  I have also ordered PFT's  This is pulmonary function tests. You will get a call to get these scheduled.  Sips of water instead of throat clearing Sugar Free Coca-Cola or Werther's originals for throat soothing. Delsym Cough syrup 5 cc's every 12 hours Non-sedating antihistamine of your choice daily ( Zyrtec, Allegra, Xyzol, Claritin ( Generic ok)  MR Brain was  negative any metastatic disease Follow up as needed.  Please contact office for sooner follow up if symptoms do not improve or worsen or seek emergency care    Follow Up Instructions: Follow up with referrals made  today for best options for treatment. Follow up with urology for PSA. Call if you need [place].    I discussed the assessment and treatment plan with the patient. The patient was provided an opportunity to ask questions and all were answered. The patient agreed with the plan and demonstrated an understanding of the instructions.   The patient was advised to call back or seek an in-person evaluation if the symptoms worsen or if the condition fails to improve as anticipated.  I spent 35 minutes dedicated to the care of this patient on the date of this encounter to include pre-visit review of records, face-to-face video visit  time with the patient discussing conditions above, post visit ordering of testing, clinical documentation with the electronic health record, making appropriate referrals as documented, and communicating necessary information to the patient's healthcare team.    Bevelyn Ngo, NP

## 2023-02-15 DIAGNOSIS — F440 Dissociative amnesia: Secondary | ICD-10-CM | POA: Diagnosis not present

## 2023-02-15 DIAGNOSIS — Z6825 Body mass index (BMI) 25.0-25.9, adult: Secondary | ICD-10-CM | POA: Diagnosis not present

## 2023-02-15 DIAGNOSIS — C3490 Malignant neoplasm of unspecified part of unspecified bronchus or lung: Secondary | ICD-10-CM | POA: Diagnosis not present

## 2023-02-15 DIAGNOSIS — E039 Hypothyroidism, unspecified: Secondary | ICD-10-CM | POA: Diagnosis not present

## 2023-02-16 ENCOUNTER — Other Ambulatory Visit: Payer: Self-pay

## 2023-02-16 NOTE — Progress Notes (Signed)
The proposed treatment discussed in conference is for discussion purpose only and is not a binding recommendation.  The patients have not been physically examined, or presented with their treatment options.  Therefore, final treatment plans cannot be decided.  

## 2023-02-20 ENCOUNTER — Encounter: Payer: Self-pay | Admitting: Gastroenterology

## 2023-02-20 ENCOUNTER — Ambulatory Visit (INDEPENDENT_AMBULATORY_CARE_PROVIDER_SITE_OTHER): Payer: Medicare PPO | Admitting: Gastroenterology

## 2023-02-20 ENCOUNTER — Encounter: Payer: Medicare PPO | Admitting: Licensed Clinical Social Worker

## 2023-02-20 VITALS — BP 132/88 | HR 73 | Temp 98.6°F | Ht 77.0 in | Wt 209.8 lb

## 2023-02-20 DIAGNOSIS — K5900 Constipation, unspecified: Secondary | ICD-10-CM | POA: Diagnosis not present

## 2023-02-20 DIAGNOSIS — K2270 Barrett's esophagus without dysplasia: Secondary | ICD-10-CM | POA: Diagnosis not present

## 2023-02-20 DIAGNOSIS — K219 Gastro-esophageal reflux disease without esophagitis: Secondary | ICD-10-CM | POA: Diagnosis not present

## 2023-02-20 MED ORDER — PANTOPRAZOLE SODIUM 40 MG PO TBEC: 40.0000 mg | DELAYED_RELEASE_TABLET | Freq: Every day | ORAL | 3 refills | Status: DC

## 2023-02-20 NOTE — Progress Notes (Signed)
Thoracic Location of Tumor / Histology: Left Lower Lobe Lung  Patient was seen for evaluation of a kidney stone.  Imaging obtained and noted a 5 mm peripheral lung nodule.  MRI Brain 02/08/2023: No acute intracranial process   PET 02/02/2023: The enlarging left lower lobe pulmonary nodule is mildly hypermetabolic for size, suspicious for bronchogenic carcinoma.  No evidence of metastatic disease.  Bronchoscopy 02/07/2023:   CT Super D Chest 01/11/2023: 9 x 8 mm irregular noncalcified nodule in the posterior left lower lobe, progressive with suspected pleural involvement, suspicious for early primary bronchogenic neoplasm.    Biopsies of Left Lower Lobe Lung 02/07/2023    Tobacco/Marijuana/Snuff/ETOH use: Non smoker  Past/Anticipated interventions by pulmonary, if any: Kandice Robinsons 02/14/2023 -Follow-up CT imaging does reveal that the nodule has increased in size now 8 mm in size.  -We have discussed his biopsy results. They are positive for non-small cell lung cancer.  -We will refer to surgery and radiation for options for treatment.   Past/Anticipated interventions by cardiothoracic surgery, if any:  Dr. Cliffton Asters 03/10/2023   Past/Anticipated interventions by medical oncology, if any:   Signs/Symptoms Weight changes, if any: None Respiratory complaints, if any: He denies SOB.  He reports towards the evenings he feels more tired. Hemoptysis, if any: He reports occasional non productive cough.  Denies hemoptysis. Pain issues, if any: Denies chest pain, pressure or tightness.   SAFETY ISSUES: Prior radiation? No Pacemaker/ICD?  No Possible current pregnancy? N/a Is the patient on methotrexate? No  Current Complaints / other details:

## 2023-02-20 NOTE — Progress Notes (Signed)
GI Office Note    Referring Provider: [name], MD Primary Care Physician:  [name], MD Primary Gastroenterologist: Hennie Duos. Marletta Lor, DO  Date:  02/20/2023  ID:  Micheal Baker, DOB 1946-03-15, MRN [id number]   Chief Complaint   Chief Complaint  Patient presents with   Follow-up    Follow up. No problems   History of Present Illness  Micheal Baker is a 77 y.o. male with a history of PVCs, NSVT, HTN, hypothyroidism, BPH, and depression presenting today for follow-up of abdominal pain, and change in bowel habits post procedures.  Colonoscopy in July 2017: -6 mm cecal polyp -4 mm polyp in the descending colon -Nonbleeding internal hemorrhoids -Path: Hyperplastic polyps -Repeat in 10 years  Last office visit 10/27/2022 performed virtually.  He reported generalized upper abdominal pain that started after Christmas and worsens after eating.  Unable to identify any specific food trigger.  Denied any dysphagia.  Recently started on pantoprazole 40 mg daily which helps significantly but still with some pain in the mornings with coffee.  Stopped taking meloxicam several months prior but recently started ibuprofen for mouth pain.  Occasional alcohol use.  No tobacco use.  Also reported intermittent constipation that is chronic.  Complaint of hernia in the groin area and told to start taking stool softeners which states these cause him to have more gas and does not help significantly.  Incomplete bowel movements.  Scheduled for EGD and colon anoscopy.  Continue PPI daily.  Start MiraLAX daily.  Increase fiber in diet.  EGD 12/01/2022: -2 cm hiatal hernia -Mild Schatzki's ring -Esophageal mucosal changes consistent with short segment Barrett's esophagus s/p biopsy -Gastritis s/p biopsy -Path: GE junction biopsy positive for Barrett's esophagus, gastric biopsy negative for H. Pylori -Advised PPI twice daily  Colonoscopy 12/01/2022: -Nonbleeding internal hemorrhoids -4 mm polyp in the  ascending colon -2 polyps in the sigmoid colon -Path: Tubular adenomas -No repeat colonoscopy due to age  Today: GERD/Gastritis/upper abdominal pain - Took meloxicam Saturday and has been fine. Taking pantoprazole once daily.   Constipation - At times he goes a few days without a BM and then will take Miralax. May have a small BM at night prior to bed. For the last 8 days he has been going daily for the most part. Has added mixed nuts back into his diet and feels as though that is helping. Drinking plenty of water. Has to strain some for Bms but not often. No melena or BRBPR. Taking miralax here and there.   States he ended up needing a catheter about a week after his procedures but then developed an infection with UTI and was given antibiotics. The 2nd night of his hospital stay he had to have another catheter and as given stuff for constipation in the hospital. He states he also   Had a left lung nodule biopsy last Tuesday and that has come back as cancer and due to see oncologist and to see a surgeon. Was found on routine check up - the nodule increased in size.   Gets his PSA checked every 6 months and is following with Dr. Ronne Binning with urology.   Current Outpatient Medications  Medication Sig Dispense Refill   acetaminophen (TYLENOL) 500 MG tablet Take 500-1,000 mg by mouth every 6 (six) hours as needed for mild pain, headache or moderate pain.     ALPHA LIPOIC ACID PO Take 1 capsule by mouth in the morning.     B Complex-C (B-COMPLEX WITH  VITAMIN C) tablet Take 1 tablet by mouth in the morning.     buPROPion (WELLBUTRIN XL) 150 MG 24 hr tablet Take 150 mg by mouth daily.     calcium carbonate (TUMS - DOSED IN MG ELEMENTAL CALCIUM) 500 MG chewable tablet Chew 1 tablet (200 mg of elemental calcium total) by mouth 3 (three) times daily with meals. 90 tablet 4   diclofenac Sodium (VOLTAREN) 1 % GEL Apply 1 Application topically 4 (four) times daily as needed (knee pain).     doxylamine,  Sleep, (UNISOM) 25 MG tablet Take 25 mg by mouth at bedtime as needed for sleep.     escitalopram (LEXAPRO) 20 MG tablet Take 20 mg by mouth daily.     finasteride (PROSCAR) 5 MG tablet Take 1 tablet (5 mg total) by mouth daily. 90 tablet 3   fluticasone (FLONASE) 50 MCG/ACT nasal spray Place 1 spray into both nostrils daily as needed for allergies.     levothyroxine (SYNTHROID) 88 MCG tablet Take 1 tablet (88 mcg total) by mouth daily before breakfast. 30 tablet 2   pantoprazole (PROTONIX) 40 MG tablet Take 40 mg by mouth daily.     Propylene Glycol (SYSTANE COMPLETE) 0.6 % SOLN Place 1 drop into both eyes as needed (dry eyes).     silodosin (RAPAFLO) 8 MG CAPS capsule Take 1 capsule (8 mg total) by mouth 2 (two) times daily. 60 capsule 11   No current facility-administered medications for this visit.    Past Medical History:  Diagnosis Date   Acute medial meniscus tear of left knee    Depression    Frequent PVCs    GERD (gastroesophageal reflux disease)    History of kidney stones    Hypertension    Hypothyroidism    NSVT (nonsustained ventricular tachycardia) (HCC)    Osteoarthritis    Sleep apnea    Thyroid disease     Past Surgical History:  Procedure Laterality Date   BIOPSY  12/01/2022   Procedure: BIOPSY;  Surgeon: Lanelle Bal, DO;  [facility];  Service: Endoscopy;;   BRONCHIAL BIOPSY  02/07/2023   Procedure: BRONCHIAL BIOPSIES;  Surgeon: Josephine Igo, DO;  Location: [facility];  Service: Pulmonary;;   BRONCHIAL NEEDLE ASPIRATION BIOPSY  02/07/2023   Procedure: BRONCHIAL NEEDLE ASPIRATION BIOPSIES;  Surgeon: Josephine Igo, DO;  Location: [facility];  Service: Pulmonary;;   CATARACT EXTRACTION     left   COLONOSCOPY N/A 04/08/2016   Surgeon: West Bali, MD; one 6 mm polyp in the cecum removed, one 4 mm polyp in the descending colon removed, nonbleeding internal hemorrhoids.  Pathology with hyperplastic polyps.  Recommended colonoscopy in 10  years.   COLONOSCOPY  12/01/2022   COLONOSCOPY WITH PROPOFOL N/A 12/01/2022   Procedure: COLONOSCOPY WITH PROPOFOL;  Surgeon: Lanelle Bal, DO;  [facility]: [facility];  Service: Endoscopy;  Laterality: N/A;  8:15 am, asa 3   endosocopy  12/01/2022   ESOPHAGOGASTRODUODENOSCOPY (EGD) WITH PROPOFOL N/A 12/01/2022   Procedure: ESOPHAGOGASTRODUODENOSCOPY (EGD) WITH PROPOFOL;  Surgeon: Lanelle Bal, DO;  [facility]: [facility];  Service: Endoscopy;  Laterality: N/A;   EYE SURGERY     left-scar tissue   FIDUCIAL MARKER PLACEMENT  02/07/2023   Procedure: FIDUCIAL MARKER PLACEMENT;  Surgeon: Josephine Igo, DO;  Location: [facility];  Service: Pulmonary;;   groin surgery Right 2010   growth removed    KNEE ARTHROSCOPY     left   KNEE  ARTHROSCOPY WITH LATERAL MENISECTOMY Right 03/11/2015   Procedure: RIGHT KNEE ARTHROSCOPY WITH MEDIALAND LATERAL MENISECTOMY;  Surgeon: Salvatore Marvel, MD;  Location: [facility];  Service: Orthopedics;  Laterality: Right;   KNEE ARTHROSCOPY WITH MEDIAL MENISECTOMY Left 06/27/2014   Procedure: LEFT KNEE ARTHROSCOPY WITH PARTIAL MEDIAL AND LATERAL MENISECTOMIES AND CHONDROPLASTY;  Surgeon: Nilda Simmer, MD;  Location: [facility];  Service: Orthopedics;  Laterality: Left;   KNEE ARTHROSCOPY WITH MEDIAL MENISECTOMY Right 03/11/2015   Procedure: KNEE ARTHROSCOPY WITH MEDIAL MENISECTOMY;  Surgeon: Salvatore Marvel, MD;  Location:  SURGERY CENTER;  Service: Orthopedics;  Laterality: Right;   LEFT HEART CATHETERIZATION WITH CORONARY ANGIOGRAM N/A 09/14/2011   Procedure: LEFT HEART CATHETERIZATION WITH CORONARY ANGIOGRAM;  Surgeon: [name], MD;  Location[facility];  Service: Cardiovascular;  Laterality: N/A;   POLYPECTOMY  04/08/2016   Procedure: POLYPECTOMY;  Surgeon: West Bali, MD;  [facility];  Service: Endoscopy;;  colon    POLYPECTOMY  12/01/2022   POLYPECTOMY  12/01/2022   Procedure:  POLYPECTOMY;  Surgeon: Lanelle Bal, DO;  [facility];  Service: Endoscopy;;   RETINAL DETACHMENT SURGERY Left 2002   Dr. Hermina Barters    Family History  Problem Relation Age of Onset   Hypertension Mother    Hypertension Father    Heart attack Father    Diabetes Father    Asthma Daughter    Colon cancer Neg Hx    Stomach cancer Neg Hx     Allergies as of 02/20/2023 - Review Complete 02/20/2023  Allergen Reaction Noted   Avelox [moxifloxacin] Other (See Comments) 08/10/2017   Penicillins Other (See Comments) 08/08/2011    Social History   Socioeconomic History   Marital status: Divorced    Spouse name: Not on file   Number of children: 2   Years of education: Masters   Highest education level: Not on file  Occupational History   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  Lives alone in a one story home.  Has 2 children.  Retired from [employer] system.  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  Education: Scientist, water quality.     Social Determinants of Health   Financial Resource Strain: Low Risk  (01/25/2023)   Overall Financial Resource Strain (CARDIA)    Difficulty of Paying Living Expenses: Not very hard  Food Insecurity: No Food Insecurity (02/08/2023)   Hunger Vital Sign    Worried About Running Out of Food in the Last Year: Never true    Ran Out of Food in the Last Year: Never true  Transportation Needs: No Transportation Needs (02/08/2023)    PRAPARE - Administrator, Civil Service (Medical): No    Lack of Transportation (Non-Medical): No  Physical Activity: Inactive (01/09/2023)   Exercise Vital Sign    Days of Exercise  per Week: 0 days    Minutes of Exercise per Session: 0 min  Stress: Stress Concern Present (01/03/2023)   [employer] of Occupational Health - Occupational Stress Questionnaire    Feeling of Stress : Rather much  Social Connections: Not on file    Review of Systems   Gen: Denies fever, chills, anorexia. Denies fatigue, weakness, weight loss.  CV: Denies chest pain, palpitations, syncope, peripheral edema, and claudication. Resp: Denies dyspnea at rest, cough, wheezing, coughing up blood, and pleurisy. GI: See HPI Derm: Denies rash, itching, dry skin Psych: Denies depression, anxiety, memory loss, confusion. No homicidal or suicidal ideation.  Heme: Denies bruising, bleeding, and enlarged lymph nodes.  Physical Exam   BP 132/88 (BP Location: Right Arm, Patient Position: Sitting, Cuff Size: Normal)   Pulse 73   Temp 98.6 F (37 C) (Temporal)   Ht 6\' 5"  (1.956 m)   Wt 209 lb 12.8 oz (95.2 kg)   SpO2 99%   BMI 24.88 kg/m   General:   Alert and oriented. No distress noted. Pleasant and cooperative.  Head:  Normocephalic and atraumatic. Eyes:  Conjuctiva clear without scleral icterus. Mouth:  Oral mucosa pink and moist. Good dentition. No lesions. Abdomen:  +BS, soft, non-tender and non-distended. No rebound or guarding. No HSM or masses noted. Rectal: deferred Msk:  Symmetrical without gross deformities. Normal posture. Extremities:  Without edema. Neurologic:  Alert and  oriented x4 Psych:  Alert and cooperative. Normal mood and affect.  Assessment  Micheal Baker is a 77 y.o. male with a history of PVCs, NSVT, HTN, hypothyroidism, BPH, and depression presenting today for follow-up of abdominal pain, and change in bowel habits post procedures.  Barrett's esophagus/GERD: Denies any  upper abdominal pain.  Taking pantoprazole 40 mg once daily.  Denies any significant breakthrough symptoms.  Has cut back on meloxicam and NSAID use.  Has only used meloxicam rarely as needed for joint pain.  Recent EGD with short segment Barrett's esophagus confirmed by biopsy and mild gastritis, negative for H. pylori.  Also with presence of mild Schatzki's ring however he continues to deny any dysphagia.  PPI refilled today and will continue indefinitely given this is helped his upper abdominal pain in the presence of Barrett's.  Constipation: Since his colonoscopy he struggled intermittently with constipation however is only taking MiraLAX once or twice and has had good relief of bowel movements thereafter.  He reports for the last 1-2 weeks he has had a daily normal bowel movement.  Occasionally does have to strain but not on a regular basis.  Advised to continue to use MiraLAX as needed and then if he has incomplete emptying he should take this daily for short amount of time.  Advised to continue fiber rich diet.  Of note patient recently diagnosed with lung cancer via biopsy of a left lung nodule and will soon be undergoing radiation with radiation oncology and will be following up with with a surgeon regarding this.  Given his treatment he would likely benefit from staying on PPI therapy.  PLAN   Miralax 17g daily as needed.  Pantoprazole 40mg  once daily. Refilled today.  Avoid NSAIDs Fiber rich diet Follow up in 1 year or sooner if needed.     Brooke Bonito, MSN, FNP-BC, AGACNP-BC [facility]

## 2023-02-20 NOTE — Patient Instructions (Signed)
Continue MiraLAX 17 g (1 capful) daily as needed for constipation.  If you began to feel constipated again I would recommend you take this daily for a couple of weeks to get back on track.  Continue to follow a fiber rich diet.  Continue to avoid NSAIDs is much as possible.  I have refilled your pantoprazole for you today.  90-day supply sent in.  You will continue this daily given the Barrett's esophagus found on your most recent upper endoscopy.  We will plan to follow-up in 1 year, sooner if needed.  Please feel free to reach out if you have any questions or concerns in the meantime.  It was a pleasure to see you today. I want to create trusting relationships with patients. If you receive a survey regarding your visit,  I greatly appreciate you taking time to fill this out on paper or through your MyChart. I value your feedback.  Brooke Bonito, MSN, FNP-BC, AGACNP-BC [facility]

## 2023-02-21 ENCOUNTER — Encounter: Payer: Self-pay | Admitting: Radiation Oncology

## 2023-02-21 ENCOUNTER — Ambulatory Visit
Admission: RE | Admit: 2023-02-21 | Discharge: 2023-02-21 | Disposition: A | Discharge status: Home / Self Care | Payer: Medicare PPO | Source: Ambulatory Visit | Attending: Radiation Oncology | Admitting: Radiation Oncology

## 2023-02-21 ENCOUNTER — Other Ambulatory Visit: Payer: Self-pay

## 2023-02-21 VITALS — BP 117/84 | HR 82 | Temp 97.8°F | Resp 18 | Ht 77.0 in | Wt 212.5 lb

## 2023-02-21 DIAGNOSIS — J432 Centrilobular emphysema: Secondary | ICD-10-CM | POA: Diagnosis not present

## 2023-02-21 DIAGNOSIS — G4730 Sleep apnea, unspecified: Secondary | ICD-10-CM | POA: Insufficient documentation

## 2023-02-21 DIAGNOSIS — I700 Atherosclerosis of aorta: Secondary | ICD-10-CM | POA: Diagnosis not present

## 2023-02-21 DIAGNOSIS — K2100 Gastro-esophageal reflux disease with esophagitis, without bleeding: Secondary | ICD-10-CM | POA: Diagnosis not present

## 2023-02-21 DIAGNOSIS — K449 Diaphragmatic hernia without obstruction or gangrene: Secondary | ICD-10-CM | POA: Diagnosis not present

## 2023-02-21 DIAGNOSIS — E039 Hypothyroidism, unspecified: Secondary | ICD-10-CM | POA: Diagnosis not present

## 2023-02-21 DIAGNOSIS — Z79899 Other long term (current) drug therapy: Secondary | ICD-10-CM | POA: Diagnosis not present

## 2023-02-21 DIAGNOSIS — C3432 Malignant neoplasm of lower lobe, left bronchus or lung: Secondary | ICD-10-CM | POA: Insufficient documentation

## 2023-02-21 DIAGNOSIS — J8410 Pulmonary fibrosis, unspecified: Secondary | ICD-10-CM | POA: Diagnosis not present

## 2023-02-21 DIAGNOSIS — M1990 Unspecified osteoarthritis, unspecified site: Secondary | ICD-10-CM | POA: Insufficient documentation

## 2023-02-21 DIAGNOSIS — Z79890 Hormone replacement therapy (postmenopausal): Secondary | ICD-10-CM | POA: Insufficient documentation

## 2023-02-21 DIAGNOSIS — I10 Essential (primary) hypertension: Secondary | ICD-10-CM | POA: Diagnosis not present

## 2023-02-21 DIAGNOSIS — Z87442 Personal history of urinary calculi: Secondary | ICD-10-CM | POA: Insufficient documentation

## 2023-02-21 DIAGNOSIS — C9110 Chronic lymphocytic leukemia of B-cell type not having achieved remission: Secondary | ICD-10-CM | POA: Insufficient documentation

## 2023-02-21 HISTORY — DX: Malignant neoplasm of unspecified part of unspecified bronchus or lung: C34.90

## 2023-02-21 NOTE — Progress Notes (Signed)
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   HISTORY OF PRESENT ILLNESS: Micheal Baker is a 77 y.o. male seen at the request of Dr. Tonia Brooms. Patient originally presented for kidney stone evaluation on 01/31/23. CT of the abdomen and pelvis incidentally showed a new 6 mm nodule in the left lung base. Follow up CT on 01/11/23 revealed 9 x 8 mm irregular noncalcified nodule in the posterior, left lower lobe suspicious for progressive disease. Patient proceeded with a bronchoscopy and biopsy under the care of Dr. Tonia Brooms on 02/07/23. Surgical pathology of the LLL revealed malignant cells, consistent with non-small cell carcinoma. There was insufficient material for further characterization, but adenocarcinoma is favored.   PET on 02/02/23 showed a hypermetabolic, biopsy confirmed, left lower lobe pulmonary nodule, but no evidence of metastatic disease; sequela of prior granulomatous disease, prostatomegaly with hypermetabolic activity in the right lobe; aortic atherosclerosis; and emphysema. MRI of the brain on 02/08/23 was negative for any metastatic disease.   Patient was kindly referred to [place] today to discuss radiation therapy. Patient is scheduled to meet with Dr. Cliffton Asters on 03/10/23 to discuss surgery.    PREVIOUS RADIATION THERAPY: No   PAST MEDICAL HISTORY:  Past Medical History:  Diagnosis Date   Acute medial meniscus tear of left knee    Depression    Frequent PVCs    GERD (gastroesophageal reflux disease)    History of kidney stones    Hypertension    Hypothyroidism    Lung cancer (HCC)    NSVT (nonsustained ventricular tachycardia) (HCC)    Osteoarthritis    Sleep apnea     Thyroid disease        PAST SURGICAL HISTORY: Past Surgical History:  Procedure Laterality Date   BIOPSY  12/01/2022   Procedure: BIOPSY;  Surgeon: Lanelle Bal, DO;  Location: [facility];  Service: Endoscopy;;   BRONCHIAL BIOPSY  02/07/2023   Procedure: BRONCHIAL BIOPSIES;  Surgeon: Josephine Igo, DO;  Location: [facility];  Service: Pulmonary;;   BRONCHIAL NEEDLE ASPIRATION BIOPSY  02/07/2023   Procedure: BRONCHIAL NEEDLE ASPIRATION BIOPSIES;  Surgeon: Josephine Igo, DO;  Location: [facility];  Service: Pulmonary;;   CATARACT EXTRACTION     left   COLONOSCOPY N/A 04/08/2016   Surgeon: West Bali, MD; one 6 mm polyp in the cecum removed, one 4 mm polyp in the descending colon removed, nonbleeding internal hemorrhoids.  Pathology with hyperplastic polyps.  Recommended colonoscopy in 10 years.   COLONOSCOPY  12/01/2022   COLONOSCOPY WITH PROPOFOL N/A 12/01/2022   Procedure: COLONOSCOPY WITH PROPOFOL;  Surgeon: Lanelle Bal, DO;  Location: [facility];  Service: Endoscopy;  Laterality: N/A;  8:15 am, asa 3   endosocopy  12/01/2022   ESOPHAGOGASTRODUODENOSCOPY (EGD) WITH PROPOFOL N/A 12/01/2022   Procedure: ESOPHAGOGASTRODUODENOSCOPY (EGD) WITH PROPOFOL;  Surgeon: Lanelle Bal, DO;  Location: [facility];  Service: Endoscopy;  Laterality: N/A;   EYE SURGERY     left-scar tissue   FIDUCIAL MARKER PLACEMENT  02/07/2023   Procedure: FIDUCIAL MARKER PLACEMENT;  Surgeon: Josephine Igo, DO;  Location: [facility];  Service:  Pulmonary;;   groin surgery Right 2010   growth removed    KNEE ARTHROSCOPY     left   KNEE ARTHROSCOPY WITH LATERAL MENISECTOMY Right 03/11/2015   Procedure: RIGHT KNEE ARTHROSCOPY WITH MEDIALAND LATERAL MENISECTOMY;  Surgeon: Salvatore Marvel, MD;  Location: [facility];  Service: Orthopedics;  Laterality: Right;   KNEE ARTHROSCOPY WITH MEDIAL MENISECTOMY Left 06/27/2014   Procedure: LEFT KNEE ARTHROSCOPY WITH PARTIAL MEDIAL  AND LATERAL MENISECTOMIES AND CHONDROPLASTY;  Surgeon: Nilda Simmer, MD;  Location: [facility];  Service: Orthopedics;  Laterality: Left;   KNEE ARTHROSCOPY WITH MEDIAL MENISECTOMY Right 03/11/2015   Procedure: KNEE ARTHROSCOPY WITH MEDIAL MENISECTOMY;  Surgeon: Salvatore Marvel, MD;  Location: [facility];  Service: Orthopedics;  Laterality: Right;   LEFT HEART CATHETERIZATION WITH CORONARY ANGIOGRAM N/A 09/14/2011   Procedure: LEFT HEART CATHETERIZATION WITH CORONARY ANGIOGRAM;  Surgeon: [name], MD;  Location: [facility];  Service: Cardiovascular;  Laterality: N/A;   POLYPECTOMY  04/08/2016   Procedure: POLYPECTOMY;  Surgeon: West Bali, MD;  Location: [facility];  Service: Endoscopy;;  colon    POLYPECTOMY  12/01/2022   POLYPECTOMY  12/01/2022   Procedure: POLYPECTOMY;  Surgeon: Lanelle Bal, DO;  Location: [facility];  Service: Endoscopy;;   RETINAL DETACHMENT SURGERY Left 2002   Dr. Hermina Barters     FAMILY HISTORY:  Family History  Problem Relation Age of Onset   Hypertension Mother    Hypertension Father    Heart attack Father    Diabetes Father    Asthma Daughter    Colon cancer Neg Hx    Stomach cancer Neg Hx      SOCIAL HISTORY:  reports that he has never smoked. He has never used smokeless tobacco. He reports current alcohol use. He reports that he does not use drugs.   ALLERGIES: Avelox [moxifloxacin] and Penicillins   MEDICATIONS:  Current Outpatient Medications  Medication Sig Dispense Refill   acetaminophen (TYLENOL) 500 MG tablet Take 500-1,000 mg by mouth every 6 (six) hours as needed for mild pain, headache or moderate pain.     ALPHA LIPOIC ACID PO Take 1 capsule by mouth in the morning.     B Complex-C (B-COMPLEX WITH VITAMIN C) tablet Take 1 tablet by mouth in the morning.     buPROPion (WELLBUTRIN XL) 150 MG 24 hr tablet Take 150 mg by mouth daily.     calcium carbonate (TUMS - DOSED IN MG ELEMENTAL  CALCIUM) 500 MG chewable tablet Chew 1 tablet (200 mg of elemental calcium total) by mouth 3 (three) times daily with meals. 90 tablet 4   diclofenac Sodium (VOLTAREN) 1 % GEL Apply 1 Application topically 4 (four) times daily as needed (knee pain).     doxylamine, Sleep, (UNISOM) 25 MG tablet Take 25 mg by mouth at bedtime as needed for sleep.     escitalopram (LEXAPRO) 20 MG tablet Take 20 mg by mouth daily.     finasteride (PROSCAR) 5 MG tablet Take 1 tablet (5 mg total) by mouth daily. 90 tablet 3   fluticasone (FLONASE) 50 MCG/ACT nasal spray Place 1 spray into both nostrils daily as needed for allergies.     levothyroxine (SYNTHROID) 88 MCG tablet Take 1 tablet (88 mcg total) by mouth daily before breakfast. 30 tablet 2   pantoprazole (PROTONIX) 40 MG tablet Take 1 tablet (40 mg total) by mouth daily. 90 tablet 3   Propylene Glycol (SYSTANE COMPLETE)  0.6 % SOLN Place 1 drop into both eyes as needed (dry eyes).     silodosin (RAPAFLO) 8 MG CAPS capsule Take 1 capsule (8 mg total) by mouth 2 (two) times daily. 60 capsule 11   No current facility-administered medications for this encounter.     REVIEW OF SYSTEMS: On review of systems, the patient reports that he is doing well overall. He denies any shortness of breath, any chest pain, shortness of breath, cough, fevers, chills, night sweats, unintended weight changes. He denies any new musculoskeletal or joint aches or pains.     PHYSICAL EXAM:  Wt Readings from Last 3 Encounters:  02/21/23 212 lb 8 oz (96.4 kg)  02/20/23 209 lb 12.8 oz (95.2 kg)  02/08/23 204 lb 2.3 oz (92.6 kg)   Temp Readings from Last 3 Encounters:  02/21/23 97.8 F (36.6 C) (Temporal)  02/20/23 98.6 F (37 C) (Temporal)  02/09/23 97.8 F (36.6 C) (Oral)   BP Readings from Last 3 Encounters:  02/21/23 117/84  02/20/23 132/88  02/10/23 (!) 143/79   Pulse Readings from Last 3 Encounters:  02/21/23 82  02/20/23 73  02/10/23 87   Pain Assessment Pain  Score: 0-No pain/10  In general this is a well appearing male in no acute distress. He's alert and oriented x4 and appropriate throughout the examination. Cardiopulmonary assessment is negative for acute distress and he exhibits normal effort.     ECOG = 0  0 - Asymptomatic (Fully active, able to carry on all predisease activities without restriction)  1 - Symptomatic but completely ambulatory (Restricted in physically strenuous activity but ambulatory and able to carry out work of a light or sedentary nature. For example, light housework, office work)  2 - Symptomatic, <50% in bed during the day (Ambulatory and capable of all self care but unable to carry out any work activities. Up and about more than 50% of waking hours)  3 - Symptomatic, >50% in bed, but not bedbound (Capable of only limited self-care, confined to bed or chair 50% or more of waking hours)  4 - Bedbound (Completely disabled. Cannot carry on any self-care. Totally confined to bed or chair)  5 - Death   Santiago Glad MM, Creech RH, Tormey DC, et al. [phone]). "Toxicity and response criteria of the [facility]". Am. Evlyn Clines. Oncol. 5 (6): 649-55    LABORATORY DATA:  Lab Results  Component Value Date   WBC 7.6 02/08/2023   HGB 14.5 02/08/2023   HCT 41.8 02/08/2023   MCV 86.4 02/08/2023   PLT 255 02/08/2023   Lab Results  Component Value Date   NA 134 (L) 02/08/2023   K 3.4 (L) 02/08/2023   CL 100 02/08/2023   CO2 19 (L) 02/08/2023   Lab Results  Component Value Date   ALT 33 02/08/2023   AST 32 02/08/2023   ALKPHOS 52 02/08/2023   BILITOT 1.1 02/08/2023      RADIOGRAPHY: EEG adult  Result Date: 02/09/2023 Charlsie Quest, MD     02/09/2023  2:31 PM Patient Name: RILLEY WAITE MRN: [id number] Epilepsy Attending: Charlsie Quest Referring Physician/Provider: Onnie Boer, MD Date: 02/09/2023 Duration: 23.07 mins Patient history: 77 year old male with transient alteration of  awareness.  EEG to evaluate for seizure. Level of alertness: Awake, asleep AEDs during EEG study: None Technical aspects: This EEG study was done with scalp electrodes positioned according to the 10-20 International system of electrode placement. Electrical activity was reviewed with band  pass filter of 1-70Hz , sensitivity of 7 uV/mm, display speed of 31mm/sec with a 60Hz  notched filter applied as appropriate. EEG data were recorded continuously and digitally stored.  Video monitoring was available and reviewed as appropriate. Description: The posterior dominant rhythm consists of 9 Hz activity of moderate voltage (25-35 uV) seen predominantly in posterior head regions, symmetric and reactive to eye opening and eye closing. Sleep was characterized by vertex waves, sleep spindles (12 to 14 Hz), maximal frontocentral region.  Hyperventilation and photic stimulation were not performed.   IMPRESSION: This study is within normal limits. No seizures or epileptiform discharges were seen throughout the recording. A normal interictal EEG does not exclude the diagnosis of epilepsy. Charlsie Quest   MR BRAIN WO CONTRAST  Result Date: 02/08/2023 CLINICAL DATA:  Altered mental status EXAM: MRI HEAD WITHOUT CONTRAST TECHNIQUE: Multiplanar, multiecho pulse sequences of the brain and surrounding structures were obtained without intravenous contrast. COMPARISON:  MR Head 05/12/21 FINDINGS: Brain: Negative for an acute infarct. No hydrocephalus. No acute hemorrhage. No extra-axial fluid collection. Sequela of mild chronic microvascular ischemic change. Vascular: Normal flow voids. Skull and upper cervical spine: Normal marrow signal. Sinuses/Orbits: Middle ear or mastoid effusion. Mild mucosal thickening bilateral ethmoid sinuses in the left maxillary sinus. Left lens replacement. Orbits are otherwise unremarkable. Other: None. IMPRESSION: No acute intracranial process. Electronically Signed   By: Lorenza Cambridge M.D.   On:  02/08/2023 13:46   NM PET Image Initial (PI) Skull Base To Thigh (F-18 FDG)  Result Date: 02/08/2023 CLINICAL DATA:  Initial treatment strategy for lung cancer. 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 EXAM: NUCLEAR MEDICINE PET SKULL BASE TO THIGH TECHNIQUE: 11.61 mCi F-18 FDG was injected intravenously. Full-ring PET imaging was performed from the skull base to thigh after the radiotracer. CT data was obtained and used for attenuation correction and anatomic localization. Fasting blood glucose: 100 mg/dl COMPARISON:  Chest CT 98/07/9146.  Abdominal CT 01/30/2022. FINDINGS: Mediastinal blood pool activity: SUV max 2.2 NECK: No hypermetabolic cervical lymph nodes are identified.Fairly symmetric activity within the lymphoid tissue of Waldeyer's ring is within physiologic limits.No suspicious activity identified within the pharyngeal mucosal space. Incidental CT findings: none CHEST: There are no hypermetabolic mediastinal, hilar or axillary lymph nodes. The enlarging left lower lobe pulmonary nodule is mildly hypermetabolic for size (SUV max 2.2). No other hypermetabolic pulmonary activity or suspicious nodularity. Incidental CT findings: Multiple calcified mediastinal and hilar lymph nodes are again noted consistent with prior granulomatous disease. Atherosclerosis of the aorta and coronary arteries. There is a small hiatal hernia. Mild centrilobular emphysema with scattered calcified pulmonary granulomas. ABDOMEN/PELVIS: There is no hypermetabolic activity within the liver, adrenal glands, spleen or pancreas. There is no hypermetabolic nodal activity in the abdomen or pelvis. The prostate gland is moderately enlarged and demonstrates prominent focal hypermetabolic activity peripherally in the right lobe (SUV max 8.8). Incidental CT findings: Scattered calcified granulomas, a stable right hepatic cyst and atherosclerosis of the aorta. SKELETON: There is no hypermetabolic activity to  suggest osseous metastatic disease. Incidental CT findings: none IMPRESSION: 1. The enlarging left lower lobe pulmonary nodule is mildly hypermetabolic for size, suspicious for bronchogenic carcinoma. Correlate with pending biopsy results. 2. No evidence of metastatic disease. 3. Sequela of prior granulomatous disease. 4. Prostatomegaly with prominent focal hypermetabolic activity peripherally in the right lobe. This could reflect focal prostatitis or prostate malignancy. Correlate with serum PSA levels. 5. Aortic Atherosclerosis (ICD10-I70.0) and Emphysema (ICD10-J43.9). Electronically Signed  By: Carey Bullocks M.D.   On: 02/08/2023 11:38   DG Chest Port 1 View  Result Date: 02/07/2023 CLINICAL DATA:  Status post bronchoscopy EXAM: PORTABLE CHEST 1 VIEW COMPARISON:  None Available. FINDINGS: Low volume AP portable examination. Mild cardiomegaly. Biopsy marking clip projects over the left lung base. Both lungs are clear. The visualized skeletal structures are unremarkable. IMPRESSION: 1. Mild cardiomegaly without acute abnormality of the lungs in low volume AP portable examination. 2.  Biopsy marking clip projects over the left lung base. Electronically Signed   By: Jearld Lesch M.D.   On: 02/07/2023 11:22   DG C-ARM BRONCHOSCOPY  Result Date: 02/07/2023 C-ARM BRONCHOSCOPY: Fluoroscopy was utilized by the requesting physician.  No radiographic interpretation.       IMPRESSION/PLAN: 1. Stage 1A1 (T1a, N0, M0) adenocarcinoma of the LLL  It was a pleasure meeting the patient today. Today we reviewed the pathology and the nature of early stage adenocarcinoma of the lung. Patient is scheduled to meet with Dr. Cliffton Asters for a surgery consult on 03/10/23. If he is a not a candidate for resection, Dr. Mitzi Hansen agrees that 3-5 fractions of stereotactic body radiation therapy (SBRT) is a good treatment option. We discussed the role of SBRT in treatment of non-small cell lung cancer. We reviewed the benefits,  risks, and possible side effects. Side effects include, but are not limited to, fatigue, skin irritation, esophagitis, discomfort to the area treated, or lung scarring. Patient expressed understanding of the treatment which is of curative intent. All questions were answered to patient's satisfaction.   We will wait for the patient's surgical consult and his final decision regarding treatment.    In a visit lasting 60 minutes, greater than 50% of the time was spent face to face discussing the patient's condition, in preparation for the discussion, and coordinating the patient's care.   The above documentation reflects my direct findings during this shared patient visit. Please see the separate note by Dr. Mitzi Hansen on this date for the remainder of the patient's plan of care.    Joyice Faster, PA-C    **Disclaimer: This note was dictated with voice recognition software. Similar sounding words can inadvertently be transcribed and this note may contain transcription errors which may not have been corrected upon publication of note.**

## 2023-02-24 DIAGNOSIS — M62830 Muscle spasm of back: Secondary | ICD-10-CM | POA: Diagnosis not present

## 2023-02-24 DIAGNOSIS — M9902 Segmental and somatic dysfunction of thoracic region: Secondary | ICD-10-CM | POA: Diagnosis not present

## 2023-02-24 DIAGNOSIS — M25561 Pain in right knee: Secondary | ICD-10-CM | POA: Diagnosis not present

## 2023-02-24 DIAGNOSIS — M9905 Segmental and somatic dysfunction of pelvic region: Secondary | ICD-10-CM | POA: Diagnosis not present

## 2023-02-24 DIAGNOSIS — M9903 Segmental and somatic dysfunction of lumbar region: Secondary | ICD-10-CM | POA: Diagnosis not present

## 2023-02-27 ENCOUNTER — Other Ambulatory Visit: Payer: Self-pay

## 2023-02-27 ENCOUNTER — Emergency Department (HOSPITAL_COMMUNITY): Payer: Medicare PPO

## 2023-02-27 ENCOUNTER — Emergency Department (HOSPITAL_COMMUNITY)
Admission: EM | Admit: 2023-02-27 | Discharge: 2023-02-27 | Disposition: A | Discharge status: Home / Self Care | Payer: Medicare PPO | Attending: Emergency Medicine | Admitting: Emergency Medicine

## 2023-02-27 ENCOUNTER — Encounter (HOSPITAL_COMMUNITY): Payer: Self-pay | Admitting: Emergency Medicine

## 2023-02-27 DIAGNOSIS — R0789 Other chest pain: Secondary | ICD-10-CM | POA: Diagnosis not present

## 2023-02-27 DIAGNOSIS — Z85118 Personal history of other malignant neoplasm of bronchus and lung: Secondary | ICD-10-CM | POA: Diagnosis not present

## 2023-02-27 DIAGNOSIS — Z79890 Hormone replacement therapy (postmenopausal): Secondary | ICD-10-CM | POA: Insufficient documentation

## 2023-02-27 DIAGNOSIS — E039 Hypothyroidism, unspecified: Secondary | ICD-10-CM | POA: Insufficient documentation

## 2023-02-27 DIAGNOSIS — I159 Secondary hypertension, unspecified: Secondary | ICD-10-CM | POA: Diagnosis not present

## 2023-02-27 DIAGNOSIS — R0902 Hypoxemia: Secondary | ICD-10-CM | POA: Diagnosis not present

## 2023-02-27 DIAGNOSIS — R079 Chest pain, unspecified: Secondary | ICD-10-CM | POA: Diagnosis not present

## 2023-02-27 DIAGNOSIS — R0602 Shortness of breath: Secondary | ICD-10-CM | POA: Diagnosis not present

## 2023-02-27 DIAGNOSIS — I10 Essential (primary) hypertension: Secondary | ICD-10-CM | POA: Diagnosis not present

## 2023-02-27 DIAGNOSIS — R531 Weakness: Secondary | ICD-10-CM | POA: Diagnosis not present

## 2023-02-27 LAB — CBC WITH DIFFERENTIAL/PLATELET
Abs Immature Granulocytes: 0.01 10*3/uL (ref 0.00–0.07)
Basophils Absolute: 0 10*3/uL (ref 0.0–0.1)
Basophils Relative: 0 %
Eosinophils Absolute: 0.2 10*3/uL (ref 0.0–0.5)
Eosinophils Relative: 3 %
HCT: 41.5 % (ref 39.0–52.0)
Hemoglobin: 14.1 g/dL (ref 13.0–17.0)
Immature Granulocytes: 0 %
Lymphocytes Relative: 17 %
Lymphs Abs: 0.8 10*3/uL (ref 0.7–4.0)
MCH: 29.9 pg (ref 26.0–34.0)
MCHC: 34 g/dL (ref 30.0–36.0)
MCV: 87.9 fL (ref 80.0–100.0)
Monocytes Absolute: 0.5 10*3/uL (ref 0.1–1.0)
Monocytes Relative: 11 %
Neutro Abs: 3.4 10*3/uL (ref 1.7–7.7)
Neutrophils Relative %: 69 %
Platelets: 195 10*3/uL (ref 150–400)
RBC: 4.72 MIL/uL (ref 4.22–5.81)
RDW: 12.7 % (ref 11.5–15.5)
WBC: 4.9 10*3/uL (ref 4.0–10.5)
nRBC: 0 % (ref 0.0–0.2)

## 2023-02-27 LAB — BASIC METABOLIC PANEL
Anion gap: 7 (ref 5–15)
BUN: 17 mg/dL (ref 8–23)
CO2: 24 mmol/L (ref 22–32)
Calcium: 9 mg/dL (ref 8.9–10.3)
Chloride: 104 mmol/L (ref 98–111)
Creatinine, Ser: 0.82 mg/dL (ref 0.61–1.24)
GFR, Estimated: >60 mL/min (ref >60)
Glucose, Bld: 94 mg/dL (ref 70–99)
Potassium: 3.8 mmol/L (ref 3.5–5.1)
Sodium: 135 mmol/L (ref 135–145)

## 2023-02-27 LAB — TROPONIN I (HIGH SENSITIVITY)
Troponin I (High Sensitivity): 12 ng/L (ref <18)
Troponin I (High Sensitivity): 15 ng/L (ref <18)

## 2023-02-27 NOTE — Discharge Instructions (Addendum)
Today your heart markers and ecg were stable. Please follow up with cardiology team provided in the next two weeks for chest pain. They will likely recommend a stress test to be completed out patient. Your chest xray was also stable. No infection or fluid. Labs were stable. Please return to ER if chest pain worsens. You did have high blood pressure and that can cause chest pain. Please follow with your primary care provider for recheck and initiation of blood pressure medications if this dose not improve. Decrease salt intake.

## 2023-02-27 NOTE — ED Provider Notes (Signed)
[place] EMERGENCY DEPARTMENT AT [facility] Provider Note   CSN: [id number] Arrival date & time: 02/27/23  1426     History  Chief Complaint  Patient presents with   Chest Pain    Micheal Baker is a 77 y.o. male.  Patient is a 77 year old male with past medical history of hypothyroidism, nonsustained V. tach, frequent PVCs, and lung cancer presenting for complaints of chest pain.  Patient admits to left-sided chest pain that began 1 hour ago while resting.  No radiation.  No associated diaphoresis or nausea.  Chest pain was described as sharp then subsided to tightness after sublingual nitroglycerin and aspirin by EMS.  Patient states he has been feeling tired over the last 2 days.  Admits to cough, rhinorrhea, and congestion that started this morning.  Denies fevers or chills.  Patient has a pertinent history for lung cancer.  Denies any shortness of breath.  No tachycardia on arrival.  Denies any lower extremity swelling, calf tenderness, or history of PE/DVT.  No blood thinners.  The history is provided by the patient. No language interpreter was used.  Chest Pain Associated symptoms: cough   Associated symptoms: no abdominal pain, no back pain, no fever, no palpitations, no shortness of breath and no vomiting        Home Medications Prior to Admission medications   Medication Sig Start Date End Date Taking? Authorizing Provider  acetaminophen (TYLENOL) 500 MG tablet Take 500-1,000 mg by mouth every 6 (six) hours as needed for mild pain, headache or moderate pain.    [provider]  ALPHA LIPOIC ACID PO Take 1 capsule by mouth in the morning.    [provider]  B Complex-C (B-COMPLEX WITH VITAMIN C) tablet Take 1 tablet by mouth in the morning.    [provider]  buPROPion (WELLBUTRIN XL) 150 MG 24 hr tablet Take 150 mg by mouth daily. 02/01/23   [provider]  calcium carbonate (TUMS - DOSED IN MG ELEMENTAL CALCIUM) 500 MG  chewable tablet Chew 1 tablet (200 mg of elemental calcium total) by mouth 3 (three) times daily with meals. 12/28/22   Shon Hale, MD  diclofenac Sodium (VOLTAREN) 1 % GEL Apply 1 Application topically 4 (four) times daily as needed (knee pain).    [provider]  doxylamine, Sleep, (UNISOM) 25 MG tablet Take 25 mg by mouth at bedtime as needed for sleep.    [provider]  escitalopram (LEXAPRO) 20 MG tablet Take 20 mg by mouth daily.    [provider]  finasteride (PROSCAR) 5 MG tablet Take 1 tablet (5 mg total) by mouth daily. 12/28/22   Shon Hale, MD  fluticasone (FLONASE) 50 MCG/ACT nasal spray Place 1 spray into both nostrils daily as needed for allergies. 09/20/22   [provider]  levothyroxine (SYNTHROID) 88 MCG tablet Take 1 tablet (88 mcg total) by mouth daily before breakfast. 02/09/23 02/09/24  Shon Hale, MD  pantoprazole (PROTONIX) 40 MG tablet Take 1 tablet (40 mg total) by mouth daily. 02/20/23   Aida Raider, NP  Propylene Glycol (SYSTANE COMPLETE) 0.6 % SOLN Place 1 drop into both eyes as needed (dry eyes).    [provider]  silodosin (RAPAFLO) 8 MG CAPS capsule Take 1 capsule (8 mg total) by mouth 2 (two) times daily. 02/10/23   McKenzie, Mardene Celeste, MD      Allergies    Avelox [moxifloxacin] and Penicillins    Review of Systems  Review of Systems  Constitutional:  Negative for chills and fever.  HENT:  Positive for congestion and rhinorrhea. Negative for ear pain and sore throat.   Eyes:  Negative for pain and visual disturbance.  Respiratory:  Positive for cough. Negative for shortness of breath.   Cardiovascular:  Positive for chest pain. Negative for palpitations.  Gastrointestinal:  Negative for abdominal pain and vomiting.  Genitourinary:  Negative for dysuria and hematuria.  Musculoskeletal:  Negative for arthralgias and back pain.  Skin:  Negative for color change and rash.  Neurological:   Negative for seizures and syncope.  All other systems reviewed and are negative.   Physical Exam Updated Vital Signs BP (!) 160/93   Pulse 68   Temp 98 F (36.7 C) (Oral)   Resp 13   SpO2 96%  Physical Exam Vitals and nursing note reviewed.  Constitutional:      General: He is not in acute distress.    Appearance: He is well-developed.  HENT:     Head: Normocephalic and atraumatic.  Eyes:     Conjunctiva/sclera: Conjunctivae normal.  Cardiovascular:     Rate and Rhythm: Normal rate and regular rhythm.     Heart sounds: No murmur heard. Pulmonary:     Effort: Pulmonary effort is normal. No respiratory distress.     Breath sounds: Normal breath sounds.  Abdominal:     Palpations: Abdomen is soft.     Tenderness: There is no abdominal tenderness.  Musculoskeletal:        General: No swelling.     Cervical back: Neck supple.  Skin:    General: Skin is warm and dry.     Capillary Refill: Capillary refill takes less than 2 seconds.  Neurological:     Mental Status: He is alert.  Psychiatric:        Mood and Affect: Mood normal.     ED Results / Procedures / Treatments   Labs (all labs ordered are listed, but only abnormal results are displayed) Labs Reviewed  CBC WITH DIFFERENTIAL/PLATELET  BASIC METABOLIC PANEL  TROPONIN I (HIGH SENSITIVITY)  TROPONIN I (HIGH SENSITIVITY)    EKG EKG Interpretation  Date/Time:  Monday February 27 2023 15:23:18 EDT Ventricular Rate:  59 PR Interval:  159 QRS Duration: 95 QT Interval:  456 QTC Calculation: 452 R Axis:   -20 Text Interpretation: Sinus rhythm LVH with secondary repolarization abnormality Anterior Q waves, possibly due to LVH Confirmed by Edwin Dada ([id number]) on 02/27/2023 3:28:00 PM  Radiology DG Chest Portable 1 View  Result Date: 02/27/2023 CLINICAL DATA:  Chest pain, shortness of breath, weakness EXAM: PORTABLE CHEST 1 VIEW COMPARISON:  02/07/2023 FINDINGS: Stable findings of old granulomatous disease. Thoracic  spondylosis. Cardiac and mediastinal margins appear normal. No blunting of the costophrenic angles. Small fiducial at the left lung base. IMPRESSION: 1. No acute findings. 2. Old granulomatous disease. 3. Thoracic spondylosis. Electronically Signed   By: Gaylyn Rong M.D.   On: 02/27/2023 16:38    Procedures Procedures    Medications Ordered in ED Medications - No data to display  ED Course/ Medical Decision Making/ A&P                             Medical Decision Making Amount and/or Complexity of Data Reviewed Labs: ordered. Radiology: ordered.   76:11 PM 77 year old male with past medical history of hypothyroidism, nonsustained V. tach, frequent PVCs, and lung cancer presenting for  complaints of chest pain.  Aspirin and sublingual nitroglycerin given in route.  On exam patient is alert and oriented x 3, no acute distress, afebrile, stable vital signs.  EKG as interpreted by myself demonstrates sinus rhythm with a rate of 59 bpm.  Evidence of LVH.  No ST segment elevation or depression.  No abnormal T wave inversions.  Stable intervals.  Patient has equal bilateral breath sounds with no adventitious lung sounds.  Chart review demonstrates patient had echocardiogram in 2015 demonstrating ejection fraction of 60 to 65%, LVH, and mild diastolic dysfunction.  The patient's chest pain is not suggestive of pulmonary embolus, cardiac ischemia, aortic dissection, pericarditis, myocarditis, pulmonary embolism, pneumothorax, pneumonia, Zoster, or esophageal perforation, or other serious etiology.  Historically not abrupt in onset, tearing or ripping, pulses symmetric. EKG nonspecific for ischemia/infarction. No dysrhythmias, brugada, WPW, prolonged QT noted. [CXR reviewed and WNL] [Troponin negative x2. CXR reviewed. Labs without demonstration of acute pathology unless otherwise noted above. Low HEART Score.  We discussed possible pe due to hx of chest pain and cancer. Pt has no sob, no  tachycardia, no lower ext swelling, etc. I offered CTPE scan given risk and declined.   Given the extremely low risk of these diagnoses further testing and evaluation for these possibilities does not appear to be indicated at this time. Patient in no distress and overall condition improved here in the ED. Detailed discussions were had with the patient regarding current findings, and need for close f/u with PCP or on call doctor. The patient has been instructed to return immediately if the symptoms worsen in any way for re-evaluation. Patient verbalized understanding and is in agreement with current care plan. All questions answered prior to discharge.          Final Clinical Impression(s) / ED Diagnoses Final diagnoses:  Secondary hypertension  Chest pain, unspecified type    Rx / DC Orders ED Discharge Orders     None         Franne Forts, DO 02/27/23 1917

## 2023-02-27 NOTE — ED Triage Notes (Signed)
Pt reports chest pain that started 1 hour ago. Pt received 1 nitroglycerin, 324 ASA en route.

## 2023-03-01 ENCOUNTER — Ambulatory Visit: Payer: Medicare PPO | Attending: Internal Medicine | Admitting: Internal Medicine

## 2023-03-01 ENCOUNTER — Ambulatory Visit: Payer: Self-pay | Admitting: Licensed Clinical Social Worker

## 2023-03-01 ENCOUNTER — Encounter: Payer: Self-pay | Admitting: Internal Medicine

## 2023-03-01 VITALS — BP 122/74 | HR 74 | Ht 77.0 in | Wt 210.8 lb

## 2023-03-01 DIAGNOSIS — I493 Ventricular premature depolarization: Secondary | ICD-10-CM | POA: Diagnosis not present

## 2023-03-01 DIAGNOSIS — R0789 Other chest pain: Secondary | ICD-10-CM | POA: Diagnosis not present

## 2023-03-01 NOTE — Patient Instructions (Signed)
Medication Instructions:  Your physician recommends that you continue on your current medications as directed. Please refer to the Current Medication list given to you today.    Labwork: None  Testing/Procedures: None  Follow-Up: Your physician recommends that you schedule a follow-up appointment in: As needed.   Any Other Special Instructions Will Be Listed Below (If Applicable).     If you need a refill on your cardiac medications before your next appointment, please call your pharmacy.   

## 2023-03-01 NOTE — Patient Instructions (Signed)
Visit Information  Thank you for taking time to visit with me today. Please don't hesitate to contact me if I can be of assistance to you.   Following are the goals we discussed today:   Goals Addressed             This Visit's Progress    Patient Stated he has decreased energy.       Interventions:  Spoke with client about client needs Discussed program support LCSW and client spoke of [facility]. Client wants to talk with PCP about referral to oncologist. Client wants a second opinion on client's medical needs Provided counseling support for client Discussed family support. Client has support of his daughter Discussed transport needs. Client drives himself to and from appointments and to complete errands Discussed sleeping issues of client. Discussed medication procurement Discussed client ambulation. He likes to walk for exercise. Reviewed mood of client. He takes Lexapro as prescribed. He wants to have accurate information about his condition before deciding on a treatment plan Discussed client rights regarding medical care for client Encouraged Clim to call LCSW as needed for SW support at [phone].          Our next appointment is by telephone on 04/25/23 at 2:30 PM  Please call the care guide team at [phone] if you need to cancel or reschedule your appointment.   If you are experiencing a Mental Health or Behavioral Health Crisis or need someone to talk to, please go to [facility] [address] [place] [phone])   The patient verbalized understanding of instructions, educational materials, and care plan provided today and DECLINED offer to receive copy of patient instructions, educational materials, and care plan.   The patient has been provided with contact information for the care management team and has been advised to call with any health related questions or concerns.   Kelton Pillar.Bern Fare  MSW, LCSW Licensed Visual merchandiser [facility] Care Management [phone]

## 2023-03-01 NOTE — Patient Outreach (Signed)
  Care Coordination   Follow Up Visit Note   03/01/2023 Name: Micheal Baker MRN: [id number] DOB: [birth year]  Micheal Baker is a 77 y.o. year old male who sees Micheal Found, MD for primary care. I spoke with  Micheal Baker by phone today.  What matters to the patients health and wellness today? Patient stated he has decreased energy    Goals Addressed             This Visit's Progress    Patient Stated he has decreased energy.       Interventions:  Spoke with client about client needs Discussed program support LCSW and client spoke of [facility]. Client wants to talk with PCP about referral to oncologist. Client wants a second opinion on client's medical needs Provided counseling support for client Discussed family support. Client has support of his daughter Discussed transport needs. Client drives himself to and from appointments and to complete errands Discussed sleeping issues of client. Discussed medication procurement Discussed client ambulation. He likes to walk for exercise. Reviewed mood of client. He takes Lexapro as prescribed. He wants to have accurate information about his condition before deciding on a treatment plan Discussed client rights regarding medical care for client Encouraged Micheal Baker to call LCSW as needed for SW support at [phone].          SDOH assessments and interventions completed:  Yes  SDOH Interventions Today    Flowsheet Row Most Recent Value  SDOH Interventions   Depression Interventions/Treatment  Medication, Counseling  Physical Activity Interventions Other (Comments)  [has decreased energy. likes to walk occasionally]  Stress Interventions Provide Counseling  [has stress in managing medical needs]        Care Coordination Interventions:  Yes, provided   Interventions Today    Flowsheet Row Most Recent Value  Chronic Disease   Chronic disease during today's visit Other  [spoke with client about client needs]   General Interventions   General Interventions Discussed/Reviewed General Interventions Discussed, Community Resources  [reviewed program resources]  Exercise Interventions   Exercise Discussed/Reviewed Exercise Reviewed  [likes to walk for exercise, when he is able to do so]  Education Interventions   Education Provided Provided Education  Provided Engineer, [employer] On [employer]  [place] and LCSW spoke of [employer].  Client wants to talk with PCP about referral to oncologist.  He wants second opinion on his medical condition]  Mental Health Interventions   Mental Health Discussed/Reviewed Coping Strategies  [discussed mood status. He wants to have accurate information before deciding on a treatment plan]  Pharmacy Interventions   Pharmacy Dicussed/Reviewed Pharmacy Topics Discussed  Safety Interventions   Safety Discussed/Reviewed Fall Risk        Follow up plan: Follow up call scheduled for 04/25/23 at 2:30 PM     Encounter Outcome:  Pt. Visit Completed   Kelton Pillar.Lleyton Byers MSW, LCSW Licensed Visual merchandiser [facility] Care Management [phone]

## 2023-03-02 ENCOUNTER — Telehealth: Payer: Self-pay | Admitting: Acute Care

## 2023-03-02 NOTE — Telephone Encounter (Signed)
Front staff has scheduled first available PFT on 8/7. Unsure if the hospital can get patient in sooner.

## 2023-03-02 NOTE — Progress Notes (Signed)
Cardiology Office Note  Date: 03/02/2023   ID: Coel, Lintz 1945/12/30, MRN [id number]  PCP:  [name], MD  Cardiologist:  Marjo Bicker, MD Electrophysiologist:  None   Reason for Office Visit: Chest pain evaluation at the request of Dr. Phillips Odor   History of Present Illness: Micheal Baker is a 77 y.o. male known to have hypothyroidism, new diagnosis of lung cancer, OSA, history of frequent PVCs was referred to cardiology clinic for evaluation of chest pain.  Patient was initially referred to cardiology clinic in 2015 for evaluation of PVCs, frequent. He was then started on metoprolol low-dose which was discontinued later on due to bradycardia.  LHC from 2013 for atypical chest pain showed angiographically normal epicardial coronary arteries. Echocardiogram from 2015 showed normal LVEF and no valvular heart disease. Patient was recently diagnosed with left lower lobe lung cancer (non-small cell lung cancer) in 02/2023. He also has prostatomegaly with prominent focal hypermetabolic activity on the PET scan, prostatitis versus prostate malignancy, will follow-up with urology for serial PSA levels.  Patient started to have sporadic episodes of left-sided chest pain x few months, symptoms lasting for 1 hour, occurs at rest (never with exertion).  This prompted ER visit on 02/27/2023. EKG showed NSR and no ischemia.  Troponins were within normal limits.  Past Medical History:  Diagnosis Date   Acute medial meniscus tear of left knee    Depression    Frequent PVCs    GERD (gastroesophageal reflux disease)    History of kidney stones    Hypertension    Hypothyroidism    Lung cancer (HCC)    NSVT (nonsustained ventricular tachycardia) (HCC)    Osteoarthritis    Sleep apnea    Thyroid disease     Past Surgical History:  Procedure Laterality Date   BIOPSY  12/01/2022   Procedure: BIOPSY;  Surgeon: Lanelle Bal, DO;  [facility];  Service: Endoscopy;;    BRONCHIAL BIOPSY  02/07/2023   Procedure: BRONCHIAL BIOPSIES;  Surgeon: Josephine Igo, DO;  Location: [facility];  Service: Pulmonary;;   BRONCHIAL NEEDLE ASPIRATION BIOPSY  02/07/2023   Procedure: BRONCHIAL NEEDLE ASPIRATION BIOPSIES;  Surgeon: Josephine Igo, DO;  Location: [facility];  Service: Pulmonary;;   CATARACT EXTRACTION     left   COLONOSCOPY N/A 04/08/2016   Surgeon: West Bali, MD; one 6 mm polyp in the cecum removed, one 4 mm polyp in the descending colon removed, nonbleeding internal hemorrhoids.  Pathology with hyperplastic polyps.  Recommended colonoscopy in 10 years.   COLONOSCOPY  12/01/2022   COLONOSCOPY WITH PROPOFOL N/A 12/01/2022   Procedure: COLONOSCOPY WITH PROPOFOL;  Surgeon: Lanelle Bal, DO;  Location: [facility];  Service: Endoscopy;  Laterality: N/A;  8:15 am, asa 3   endosocopy  12/01/2022   ESOPHAGOGASTRODUODENOSCOPY (EGD) WITH PROPOFOL N/A 12/01/2022   Procedure: ESOPHAGOGASTRODUODENOSCOPY (EGD) WITH PROPOFOL;  Surgeon: Lanelle Bal, DO;  [facility]: [facility];  Service: Endoscopy;  Laterality: N/A;   EYE SURGERY     left-scar tissue   FIDUCIAL MARKER PLACEMENT  02/07/2023   Procedure: FIDUCIAL MARKER PLACEMENT;  Surgeon: Josephine Igo, DO;  Location: [facility];  Service: Pulmonary;;   groin surgery Right 2010   growth removed    KNEE ARTHROSCOPY     left   KNEE ARTHROSCOPY WITH LATERAL MENISECTOMY Right 03/11/2015   Procedure: RIGHT KNEE ARTHROSCOPY WITH MEDIALAND LATERAL MENISECTOMY;  Surgeon: Salvatore Marvel, MD;  Location:  [facility];  Service: Orthopedics;  Laterality: Right;   KNEE ARTHROSCOPY WITH MEDIAL MENISECTOMY Left 06/27/2014   Procedure: LEFT KNEE ARTHROSCOPY WITH PARTIAL MEDIAL AND LATERAL MENISECTOMIES AND CHONDROPLASTY;  Surgeon: Nilda Simmer, MD;  Location: [facility];  Service: Orthopedics;  Laterality: Left;   KNEE ARTHROSCOPY WITH MEDIAL MENISECTOMY Right 03/11/2015    Procedure: KNEE ARTHROSCOPY WITH MEDIAL MENISECTOMY;  Surgeon: Salvatore Marvel, MD;  Location: [facility];  Service: Orthopedics;  Laterality: Right;   LEFT HEART CATHETERIZATION WITH CORONARY ANGIOGRAM N/A 09/14/2011   Procedure: LEFT HEART CATHETERIZATION WITH CORONARY ANGIOGRAM;  Surgeon: [name], MD;  Location: [facility] C[facility] LAB;  Service: Cardiovascular;  Laterality: N/A;   POLYPECTOMY  04/08/2016   Procedure: POLYPECTOMY;  Surgeon: West Bali, MD;  [facility];  Service: Endoscopy;;  colon    POLYPECTOMY  12/01/2022   POLYPECTOMY  12/01/2022   Procedure: POLYPECTOMY;  Surgeon: Lanelle Bal, DO;  [facility];  Service: Endoscopy;;   RETINAL DETACHMENT SURGERY Left 2002   Dr. Hermina Barters    Current Outpatient Medications  Medication Sig Dispense Refill   acetaminophen (TYLENOL) 500 MG tablet Take 500-1,000 mg by mouth every 6 (six) hours as needed for mild pain, headache or moderate pain.     ALPHA LIPOIC ACID PO Take 1 capsule by mouth in the morning.     B Complex-C (B-COMPLEX WITH VITAMIN C) tablet Take 1 tablet by mouth in the morning.     buPROPion (WELLBUTRIN XL) 150 MG 24 hr tablet Take 150 mg by mouth daily.     calcium carbonate (TUMS - DOSED IN MG ELEMENTAL CALCIUM) 500 MG chewable tablet Chew 1 tablet (200 mg of elemental calcium total) by mouth 3 (three) times daily with meals. 90 tablet 4   diclofenac Sodium (VOLTAREN) 1 % GEL Apply 1 Application topically 4 (four) times daily as needed (knee pain).     doxylamine, Sleep, (UNISOM) 25 MG tablet Take 25 mg by mouth at bedtime as needed for sleep.     escitalopram (LEXAPRO) 20 MG tablet Take 20 mg by mouth daily.     finasteride (PROSCAR) 5 MG tablet Take 1 tablet (5 mg total) by mouth daily. 90 tablet 3   fluticasone (FLONASE) 50 MCG/ACT nasal spray Place 1 spray into both nostrils daily as needed for allergies.     levothyroxine (SYNTHROID) 88 MCG tablet Take 1 tablet (88 mcg  total) by mouth daily before breakfast. 30 tablet 2   pantoprazole (PROTONIX) 40 MG tablet Take 1 tablet (40 mg total) by mouth daily. 90 tablet 3   Propylene Glycol (SYSTANE COMPLETE) 0.6 % SOLN Place 1 drop into both eyes as needed (dry eyes).     silodosin (RAPAFLO) 8 MG CAPS capsule Take 1 capsule (8 mg total) by mouth 2 (two) times daily. 60 capsule 11   No current facility-administered medications for this visit.   Allergies:  Avelox [moxifloxacin] and Penicillins   Social History: The patient  reports that he has never smoked. He has never used smokeless tobacco. He reports current alcohol use. He reports that he does not use drugs.   Family History: The patient's family history includes Asthma in his daughter; Diabetes in his father; Heart attack in his father; Hypertension in his father and mother.   ROS:  Please see the history of present illness. Otherwise, complete review of systems is positive for none  All other systems are reviewed and  negative.   Physical Exam: VS:  BP 122/74   Pulse 74   Ht 6\' 5"  (1.956 m)   Wt 210 lb 12.8 oz (95.6 kg)   SpO2 96%   BMI 25.00 kg/m , BMI Body mass index is 25 kg/m.  Wt Readings from Last 3 Encounters:  03/01/23 210 lb 12.8 oz (95.6 kg)  02/21/23 212 lb 8 oz (96.4 kg)  02/20/23 209 lb 12.8 oz (95.2 kg)    General: Patient appears comfortable at rest. HEENT: Conjunctiva and lids normal, oropharynx clear with moist mucosa. Neck: Supple, no elevated JVP or carotid bruits, no thyromegaly. Lungs: Clear to auscultation, nonlabored breathing at rest. Cardiac: Regular rate and rhythm, no S3 or significant systolic murmur, no pericardial rub. Abdomen: Soft, nontender, no hepatomegaly, bowel sounds present, no guarding or rebound. Extremities: No pitting edema, distal pulses 2+. Skin: Warm and dry. Musculoskeletal: No kyphosis. Neuropsychiatric: Alert and oriented x3, affect grossly appropriate.  Recent Labwork: 02/08/2023: ALT 33; AST  32; Magnesium 2.1 02/09/2023: TSH 5.294 02/27/2023: BUN 17; Creatinine, Ser 0.82; Hemoglobin 14.1; Platelets 195; Potassium 3.8; Sodium 135  No results found for: "CHOL", "TRIG", "HDL", "CHOLHDL", "VLDL", "LDLCALC", "LDLDIRECT"   Assessment and Plan:  # Noncardiac chest pain -Sporadic episodes of left-sided chest pain, occurs mainly at rest and never with exertion. He was recently diagnosed with left lower lobe lung cancer which I believe might be the etiology for his pain. LHC from 2013 showed angiographically normal epicardial coronary arteries. Echocardiogram from 2015 showed normal LVEF and no valvular heart disease.  No indication of cardiac stress testing at this time.  # History PVCs -Patient was initially referred to cardiology clinic in 2015 for history of PVCs on the monitor during the procedure.  He was placed on metoprolol low-dose but had to be discontinued later on due to bradycardia.  EKG today showed NSR and no PVCs.  Patient also denies any palpitations.  No further testing needed at this time.   I have spent a total of 30 minutes with patient reviewing chart, EKGs, labs and examining patient as well as establishing an assessment and plan that was discussed with the patient.  > 50% of time was spent in direct patient care.    Medication Adjustments/Labs and Tests Ordered: Current medicines are reviewed at length with the patient today.  Concerns regarding medicines are outlined above.   Tests Ordered: No orders of the defined types were placed in this encounter.   Medication Changes: No orders of the defined types were placed in this encounter.   Disposition:  Follow up prn  Signed Anadia Helmes Verne Spurr, MD, 03/02/2023 10:15 AM    [facility] at [facility] [address] [place], [place], [place] [address]

## 2023-03-02 NOTE — Telephone Encounter (Signed)
Patient called to inform Maralyn Sago that his cardiologist suggested he contact her regarding a pain he had Monday which he thought he was having a heart attack.  The said it was due to the test he had previously and he should contact the pulmonary doctor. Please advise and call patient to discuss further.  CB# [phone]

## 2023-03-02 NOTE — Telephone Encounter (Signed)
Called and spoke to patient. Informed patient of the recommendations per Kandice Robinsons, NP. Patient states the discomfort is much improved but will still seek emergency care if pain becomes worse. Patient aware he will be contacted soon to get PFTs scheduled. Will also keep encounter in pool to follow up with patient's appointment with thoracic.    Front, please schedule PFTs as soon as able. Schedule has blocked spots. Thanks.

## 2023-03-02 NOTE — Telephone Encounter (Signed)
Patient was scheduled in a cancellation tomorrow at 2pm

## 2023-03-03 ENCOUNTER — Ambulatory Visit (INDEPENDENT_AMBULATORY_CARE_PROVIDER_SITE_OTHER): Payer: Medicare PPO | Admitting: Pulmonary Disease

## 2023-03-03 DIAGNOSIS — C3490 Malignant neoplasm of unspecified part of unspecified bronchus or lung: Secondary | ICD-10-CM

## 2023-03-03 LAB — PULMONARY FUNCTION TEST
DL/VA % pred: 117 %
DL/VA: 4.52 ml/min/mmHg/L
DLCO cor % pred: 99 %
DLCO cor: 30.4 ml/min/mmHg
DLCO unc % pred: 99 %
DLCO unc: 30.4 ml/min/mmHg
FEF 25-75 Post: 3.48 L/sec
FEF 25-75 Pre: 1.97 L/sec
FEF2575-%Change-Post: 76 %
FEF2575-%Pred-Post: 121 %
FEF2575-%Pred-Pre: 68 %
FEV1-%Change-Post: 32 %
FEV1-%Pred-Post: 82 %
FEV1-%Pred-Pre: 62 %
FEV1-Post: 3.27 L
FEV1-Pre: 2.48 L
FEV1FVC-%Change-Post: 28 %
FEV1FVC-%Pred-Pre: 85 %
FEV6-%Change-Post: 2 %
FEV6-%Pred-Post: 79 %
FEV6-%Pred-Pre: 77 %
FEV6-Post: 4.08 L
FEV6-Pre: 3.97 L
FEV6FVC-%Change-Post: 0 %
FEV6FVC-%Pred-Post: 105 %
FEV6FVC-%Pred-Pre: 105 %
FVC-%Change-Post: 2 %
FVC-%Pred-Post: 75 %
FVC-%Pred-Pre: 73 %
FVC-Post: 4.1 L
FVC-Pre: 3.99 L
Post FEV1/FVC ratio: 80 %
Post FEV6/FVC ratio: 100 %
Pre FEV1/FVC ratio: 62 %
Pre FEV6/FVC Ratio: 99 %
RV % pred: 111 %
RV: 3.3 L
TLC % pred: 87 %
TLC: 7.42 L

## 2023-03-03 NOTE — Progress Notes (Signed)
Full PFT performed today. °

## 2023-03-03 NOTE — Patient Instructions (Signed)
Full PFT performed today. °

## 2023-03-06 DIAGNOSIS — E663 Overweight: Secondary | ICD-10-CM | POA: Diagnosis not present

## 2023-03-06 DIAGNOSIS — E559 Vitamin D deficiency, unspecified: Secondary | ICD-10-CM | POA: Diagnosis not present

## 2023-03-06 DIAGNOSIS — C3490 Malignant neoplasm of unspecified part of unspecified bronchus or lung: Secondary | ICD-10-CM | POA: Diagnosis not present

## 2023-03-06 DIAGNOSIS — Z6825 Body mass index (BMI) 25.0-25.9, adult: Secondary | ICD-10-CM | POA: Diagnosis not present

## 2023-03-06 NOTE — H&P (View-Only) (Signed)
[address]       [place] [address]             [phone]                    KENDELL BERDING [facility] Medical Record #[id number] Date of Birth: 1945-10-28  Referring: Bevelyn Ngo, NP Primary Care: [name], MD Primary Cardiologist: Marjo Bicker, MD  Chief Complaint:    Chief Complaint  Patient presents with   Lung Cancer    Bronch 5/23, PET 5/23, Chest 5/1, MR Brain 5/29, PFT 6/21    History of Present Illness:    RYKER Baker 77 y.o. 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  He is a lifelong non-smoker, and this was found incidentally.  It initially was watched, and on surveillance scan, there was measurable growth.    He does complain of some fatigue that occurs at the end of the day.  He remains active, and is able to walk 30 minutes without any significant symptoms.  He denies any neurologic problems, or weight changes.       Past Medical History:  Diagnosis Date   Acute medial meniscus tear of left knee    Depression    Frequent PVCs    GERD (gastroesophageal reflux disease)    History of kidney stones    Hypertension    Hypothyroidism    Lung cancer (HCC)    NSVT (nonsustained ventricular tachycardia) (HCC)    Osteoarthritis    Sleep apnea    Thyroid disease     Past Surgical History:  Procedure Laterality Date   BIOPSY  12/01/2022   Procedure: BIOPSY;  Surgeon: Lanelle Bal, DO;  [facility];  Service: Endoscopy;;   BRONCHIAL BIOPSY  02/07/2023   Procedure: BRONCHIAL BIOPSIES;  Surgeon: Josephine Igo, DO;  Location: [facility];  Service: Pulmonary;;   BRONCHIAL NEEDLE ASPIRATION BIOPSY  02/07/2023   Procedure: BRONCHIAL NEEDLE ASPIRATION BIOPSIES;  Surgeon: Josephine Igo, DO;  Location: [facility];  Service: Pulmonary;;   CATARACT EXTRACTION     left   COLONOSCOPY N/A 04/08/2016   Surgeon: West Bali, MD; one 6 mm polyp in the cecum removed, one 4  mm polyp in the descending colon removed, nonbleeding internal hemorrhoids.  Pathology with hyperplastic polyps.  Recommended colonoscopy in 10 years.   COLONOSCOPY  12/01/2022   COLONOSCOPY WITH PROPOFOL N/A 12/01/2022   Procedure: COLONOSCOPY WITH PROPOFOL;  Surgeon: Lanelle Bal, DO;  [facility];  Service: Endoscopy;  Laterality: N/A;  8:15 am, asa 3   endosocopy  12/01/2022   ESOPHAGOGASTRODUODENOSCOPY (EGD) WITH PROPOFOL N/A 12/01/2022   Procedure: ESOPHAGOGASTRODUODENOSCOPY (EGD) WITH PROPOFOL;  Surgeon: Lanelle Bal, DO;  [facility]: [facility];  Service: Endoscopy;  Laterality: N/A;   EYE SURGERY     left-scar tissue   FIDUCIAL MARKER PLACEMENT  02/07/2023   Procedure: FIDUCIAL MARKER PLACEMENT;  Surgeon: Josephine Igo, DO;  Location: [facility];  Service: Pulmonary;;   groin surgery Right 2010   growth removed    KNEE ARTHROSCOPY     left   KNEE ARTHROSCOPY WITH LATERAL MENISECTOMY Right 03/11/2015   Procedure: RIGHT KNEE ARTHROSCOPY WITH MEDIALAND LATERAL MENISECTOMY;  Surgeon: Salvatore Marvel, MD;  Location: [facility];  Service: Orthopedics;  Laterality: Right;   KNEE ARTHROSCOPY WITH MEDIAL MENISECTOMY Left 06/27/2014  Procedure: LEFT KNEE ARTHROSCOPY WITH PARTIAL MEDIAL AND LATERAL MENISECTOMIES AND CHONDROPLASTY;  Surgeon: Nilda Simmer, MD;  Location: [facility];  Service: Orthopedics;  Laterality: Left;   KNEE ARTHROSCOPY WITH MEDIAL MENISECTOMY Right 03/11/2015   Procedure: KNEE ARTHROSCOPY WITH MEDIAL MENISECTOMY;  Surgeon: Salvatore Marvel, MD;  Location: [facility];  Service: Orthopedics;  Laterality: Right;   LEFT HEART CATHETERIZATION WITH CORONARY ANGIOGRAM N/A 09/14/2011   Procedure: LEFT HEART CATHETERIZATION WITH CORONARY ANGIOGRAM;  Surgeon: [name], MD;  Location: [facility];  Service: Cardiovascular;  Laterality: N/A;   POLYPECTOMY  04/08/2016   Procedure: POLYPECTOMY;  Surgeon: West Bali, MD;  [facility];  Service: Endoscopy;;  colon    POLYPECTOMY  12/01/2022   POLYPECTOMY  12/01/2022   Procedure: POLYPECTOMY;  Surgeon: Lanelle Bal, DO;  [facility];  Service: Endoscopy;;   RETINAL DETACHMENT SURGERY Left 2002   Dr. Hermina Barters    Family History  Problem Relation Age of Onset   Hypertension Mother    Hypertension Father    Heart attack Father    Diabetes Father    Asthma Daughter    Colon cancer Neg Hx    Stomach cancer Neg Hx      Social History   Tobacco Use  Smoking Status Never  Smokeless Tobacco Never  Tobacco Comments   06/10/14 1968- Tried it once and didn't like it- AJ    Social History   Substance and Sexual Activity  Alcohol Use Yes   Alcohol/week: 0.0 standard drinks of alcohol   Comment: occasionally     Allergies  Allergen Reactions   Avelox [Moxifloxacin] Other (See Comments)    Caused tendonitis   Penicillins Other (See Comments)    Chills     Current Outpatient Medications  Medication Sig Dispense Refill   acetaminophen (TYLENOL) 500 MG tablet Take 500-1,000 mg by mouth every 6 (six) hours as needed for mild pain, headache or moderate pain.     ALPHA LIPOIC ACID PO Take 1 capsule by mouth in the morning.     B Complex-C (B-COMPLEX WITH VITAMIN C) tablet Take 1 tablet by mouth in the morning.     buPROPion (WELLBUTRIN XL) 150 MG 24 hr tablet Take 150 mg by mouth daily.     calcium carbonate (TUMS - DOSED IN MG ELEMENTAL CALCIUM) 500 MG chewable tablet Chew 1 tablet (200 mg of elemental calcium total) by mouth 3 (three) times daily with meals. 90 tablet 4   diclofenac Sodium (VOLTAREN) 1 % GEL Apply 1 Application topically 4 (four) times daily as needed (knee pain).     doxylamine, Sleep, (UNISOM) 25 MG tablet Take 25 mg by mouth at bedtime as needed for sleep.     escitalopram (LEXAPRO) 20 MG tablet Take 20 mg by mouth daily.     finasteride (PROSCAR) 5 MG tablet Take 1 tablet (5 mg total) by  mouth daily. 90 tablet 3   fluticasone (FLONASE) 50 MCG/ACT nasal spray Place 1 spray into both nostrils daily as needed for allergies.     levothyroxine (SYNTHROID) 88 MCG tablet Take 1 tablet (88 mcg total) by mouth daily before breakfast. 30 tablet 2   pantoprazole (PROTONIX) 40 MG tablet Take 1 tablet (40 mg total) by mouth daily. 90 tablet 3   Propylene Glycol (SYSTANE COMPLETE) 0.6 % SOLN Place 1 drop into both eyes as needed (dry eyes).     silodosin (RAPAFLO) 8 MG  CAPS capsule Take 1 capsule (8 mg total) by mouth 2 (two) times daily. 60 capsule 11   No current facility-administered medications for this visit.    Review of Systems  Constitutional:  Positive for malaise/fatigue. Negative for weight loss.  Respiratory:  Positive for shortness of breath.   Cardiovascular:  Positive for chest pain.  Neurological: Negative.      PHYSICAL EXAMINATION: BP 135/89   Pulse 75   Resp 18   Ht 6\' 5"  (1.956 m)   Wt 211 lb (95.7 kg)   SpO2 98% Comment: RA  BMI 25.02 kg/m  Physical Exam Constitutional:      General: He is not in acute distress.    Appearance: Normal appearance. He is normal weight. He is not ill-appearing.  HENT:     Head: Normocephalic and atraumatic.  Eyes:     Extraocular Movements: Extraocular movements intact.  Cardiovascular:     Rate and Rhythm: Normal rate.  Pulmonary:     Effort: Pulmonary effort is normal. No respiratory distress.  Abdominal:     General: Abdomen is flat. There is no distension.  Musculoskeletal:        General: Normal range of motion.     Cervical back: Normal range of motion.  Skin:    General: Skin is warm and dry.  Neurological:     General: No focal deficit present.     Mental Status: He is alert and oriented to person, place, and time.     Diagnostic Studies & Laboratory data:     Recent Radiology Findings:   DG Chest Portable 1 View  Result Date: 02/27/2023 CLINICAL DATA:  Chest pain, shortness of breath, weakness  EXAM: PORTABLE CHEST 1 VIEW COMPARISON:  02/07/2023 FINDINGS: Stable findings of old granulomatous disease. Thoracic spondylosis. Cardiac and mediastinal margins appear normal. No blunting of the costophrenic angles. Small fiducial at the left lung base. IMPRESSION: 1. No acute findings. 2. Old granulomatous disease. 3. Thoracic spondylosis. Electronically Signed   By: Gaylyn Rong M.D.   On: 02/27/2023 16:38   EEG adult  Result Date: 02/09/2023 Charlsie Quest, MD     02/09/2023  2:31 PM Patient Name: Micheal Baker MRN: [id number] Epilepsy Attending: Charlsie Quest Referring Physician/Provider: Onnie Boer, MD Date: 02/09/2023 Duration: 23.07 mins Patient history: 77 year old male with transient alteration of awareness.  EEG to evaluate for seizure. Level of alertness: Awake, asleep AEDs during EEG study: None Technical aspects: This EEG study was done with scalp electrodes positioned according to the 10-20 International system of electrode placement. Electrical activity was reviewed with band pass filter of 1-70Hz , sensitivity of 7 uV/mm, display speed of 5mm/sec with a 60Hz  notched filter applied as appropriate. EEG data were recorded continuously and digitally stored.  Video monitoring was available and reviewed as appropriate. Description: The posterior dominant rhythm consists of 9 Hz activity of moderate voltage (25-35 uV) seen predominantly in posterior head regions, symmetric and reactive to eye opening and eye closing. Sleep was characterized by vertex waves, sleep spindles (12 to 14 Hz), maximal frontocentral region.  Hyperventilation and photic stimulation were not performed.   IMPRESSION: This study is within normal limits. No seizures or epileptiform discharges were seen throughout the recording. A normal interictal EEG does not exclude the diagnosis of epilepsy. Charlsie Quest   MR BRAIN WO CONTRAST  Result Date: 02/08/2023 CLINICAL DATA:  Altered mental status EXAM: MRI  HEAD WITHOUT CONTRAST TECHNIQUE: Multiplanar, multiecho pulse sequences of the brain  and surrounding structures were obtained without intravenous contrast. COMPARISON:  MR Head 05/12/21 FINDINGS: Brain: Negative for an acute infarct. No hydrocephalus. No acute hemorrhage. No extra-axial fluid collection. Sequela of mild chronic microvascular ischemic change. Vascular: Normal flow voids. Skull and upper cervical spine: Normal marrow signal. Sinuses/Orbits: Middle ear or mastoid effusion. Mild mucosal thickening bilateral ethmoid sinuses in the left maxillary sinus. Left lens replacement. Orbits are otherwise unremarkable. Other: None. IMPRESSION: No acute intracranial process. Electronically Signed   By: Lorenza Cambridge M.D.   On: 02/08/2023 13:46   DG Chest Port 1 View  Result Date: 02/07/2023 CLINICAL DATA:  Status post bronchoscopy EXAM: PORTABLE CHEST 1 VIEW COMPARISON:  None Available. FINDINGS: Low volume AP portable examination. Mild cardiomegaly. Biopsy marking clip projects over the left lung base. Both lungs are clear. The visualized skeletal structures are unremarkable. IMPRESSION: 1. Mild cardiomegaly without acute abnormality of the lungs in low volume AP portable examination. 2.  Biopsy marking clip projects over the left lung base. Electronically Signed   By: Jearld Lesch M.D.   On: 02/07/2023 11:22   DG C-ARM BRONCHOSCOPY  Result Date: 02/07/2023 C-ARM BRONCHOSCOPY: Fluoroscopy was utilized by the requesting physician.  No radiographic interpretation.       I have independently reviewed the above radiology studies  and reviewed the findings with the patient.   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  Atypical chest pain has already been worked up, but I will order a stress test.  We discussed the risks and benefits for a L LRATS, LLLectomy.  He is agreeable to proceed.     I  spent 40 minutes with  the patient face to face in counseling and coordination of care.    Corliss Skains 03/08/2023 12:23 PM

## 2023-03-06 NOTE — Progress Notes (Signed)
[address]       [place] [address]             [id number]                    [name] [facility] Medical Record #[id number] Date of Birth: 03/27/1946  Referring: Bevelyn Ngo, NP Primary Care: [name], MD Primary Cardiologist: Marjo Bicker, MD  Chief Complaint:    Chief Complaint  Patient presents with   Lung Cancer    Bronch 5/23, PET 5/23, Chest 5/1, MR Brain 5/29, PFT 6/21    History of Present Illness:    Micheal Baker 77 y.o. 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  He is a lifelong non-smoker, and this was found incidentally.  It initially was watched, and on surveillance scan, there was measurable growth.    He does complain of some fatigue that occurs at the end of the day.  He remains active, and is able to walk 30 minutes without any significant symptoms.  He denies any neurologic problems, or weight changes.       Past Medical History:  Diagnosis Date   Acute medial meniscus tear of left knee    Depression    Frequent PVCs    GERD (gastroesophageal reflux disease)    History of kidney stones    Hypertension    Hypothyroidism    Lung cancer (HCC)    NSVT (nonsustained ventricular tachycardia) (HCC)    Osteoarthritis    Sleep apnea    Thyroid disease     Past Surgical History:  Procedure Laterality Date   BIOPSY  12/01/2022   Procedure: BIOPSY;  Surgeon: Lanelle Bal, DO;  [facility];  Service: Endoscopy;;   BRONCHIAL BIOPSY  02/07/2023   Procedure: BRONCHIAL BIOPSIES;  Surgeon: Josephine Igo, DO;  [facility]: [facility];  Service: Pulmonary;;   BRONCHIAL NEEDLE ASPIRATION BIOPSY  02/07/2023   Procedure: BRONCHIAL NEEDLE ASPIRATION BIOPSIES;  Surgeon: Josephine Igo, DO;  Location: [facility];  Service: Pulmonary;;   CATARACT EXTRACTION     left   COLONOSCOPY N/A 04/08/2016   Surgeon: West Bali, MD; one 6 mm polyp in the cecum removed, one 4  mm polyp in the descending colon removed, nonbleeding internal hemorrhoids.  Pathology with hyperplastic polyps.  Recommended colonoscopy in 10 years.   COLONOSCOPY  12/01/2022   COLONOSCOPY WITH PROPOFOL N/A 12/01/2022   Procedure: COLONOSCOPY WITH PROPOFOL;  Surgeon: Lanelle Bal, DO;  [facility];  Service: Endoscopy;  Laterality: N/A;  8:15 am, asa 3   endosocopy  12/01/2022   ESOPHAGOGASTRODUODENOSCOPY (EGD) WITH PROPOFOL N/A 12/01/2022   Procedure: ESOPHAGOGASTRODUODENOSCOPY (EGD) WITH PROPOFOL;  Surgeon: Lanelle Bal, DO;  [facility];  Service: Endoscopy;  Laterality: N/A;   EYE SURGERY     left-scar tissue   FIDUCIAL MARKER PLACEMENT  02/07/2023   Procedure: FIDUCIAL MARKER PLACEMENT;  Surgeon: Josephine Igo, DO;  Location: [facility];  Service: Pulmonary;;   groin surgery Right 2010   growth removed    KNEE ARTHROSCOPY     left   KNEE ARTHROSCOPY WITH LATERAL MENISECTOMY Right 03/11/2015   Procedure: RIGHT KNEE ARTHROSCOPY WITH MEDIALAND LATERAL MENISECTOMY;  Surgeon: Salvatore Marvel, MD;  Location: [facility];  Service: Orthopedics;  Laterality: Right;   KNEE ARTHROSCOPY WITH MEDIAL MENISECTOMY Left 06/27/2014  Procedure: LEFT KNEE ARTHROSCOPY WITH PARTIAL MEDIAL AND LATERAL MENISECTOMIES AND CHONDROPLASTY;  Surgeon: Nilda Simmer, MD;  Location: [facility];  Service: Orthopedics;  Laterality: Left;   KNEE ARTHROSCOPY WITH MEDIAL MENISECTOMY Right 03/11/2015   Procedure: KNEE ARTHROSCOPY WITH MEDIAL MENISECTOMY;  Surgeon: Salvatore Marvel, MD;  Location: [facility];  Service: Orthopedics;  Laterality: Right;   LEFT HEART CATHETERIZATION WITH CORONARY ANGIOGRAM N/A 09/14/2011   Procedure: LEFT HEART CATHETERIZATION WITH CORONARY ANGIOGRAM;  Surgeon: [name], MD;  Location: [facility] CATH LAB;  Service: Cardiovascular;  Laterality: N/A;   POLYPECTOMY  04/08/2016   Procedure: POLYPECTOMY;  Surgeon: West Bali, MD;  [facility];  Service: Endoscopy;;  colon    POLYPECTOMY  12/01/2022   POLYPECTOMY  12/01/2022   Procedure: POLYPECTOMY;  Surgeon: Lanelle Bal, DO;  [facility];  Service: Endoscopy;;   RETINAL DETACHMENT SURGERY Left 2002   Dr. Hermina Barters    Family History  Problem Relation Age of Onset   Hypertension Mother    Hypertension Father    Heart attack Father    Diabetes Father    Asthma Daughter    Colon cancer Neg Hx    Stomach cancer Neg Hx      Social History   Tobacco Use  Smoking Status Never  Smokeless Tobacco Never  Tobacco Comments   06/10/14 1968- Tried it once and didn't like it- AJ    Social History   Substance and Sexual Activity  Alcohol Use Yes   Alcohol/week: 0.0 standard drinks of alcohol   Comment: occasionally     Allergies  Allergen Reactions   Avelox [Moxifloxacin] Other (See Comments)    Caused tendonitis   Penicillins Other (See Comments)    Chills     Current Outpatient Medications  Medication Sig Dispense Refill   acetaminophen (TYLENOL) 500 MG tablet Take 500-1,000 mg by mouth every 6 (six) hours as needed for mild pain, headache or moderate pain.     ALPHA LIPOIC ACID PO Take 1 capsule by mouth in the morning.     B Complex-C (B-COMPLEX WITH VITAMIN C) tablet Take 1 tablet by mouth in the morning.     buPROPion (WELLBUTRIN XL) 150 MG 24 hr tablet Take 150 mg by mouth daily.     calcium carbonate (TUMS - DOSED IN MG ELEMENTAL CALCIUM) 500 MG chewable tablet Chew 1 tablet (200 mg of elemental calcium total) by mouth 3 (three) times daily with meals. 90 tablet 4   diclofenac Sodium (VOLTAREN) 1 % GEL Apply 1 Application topically 4 (four) times daily as needed (knee pain).     doxylamine, Sleep, (UNISOM) 25 MG tablet Take 25 mg by mouth at bedtime as needed for sleep.     escitalopram (LEXAPRO) 20 MG tablet Take 20 mg by mouth daily.     finasteride (PROSCAR) 5 MG tablet Take 1 tablet (5 mg total) by  mouth daily. 90 tablet 3   fluticasone (FLONASE) 50 MCG/ACT nasal spray Place 1 spray into both nostrils daily as needed for allergies.     levothyroxine (SYNTHROID) 88 MCG tablet Take 1 tablet (88 mcg total) by mouth daily before breakfast. 30 tablet 2   pantoprazole (PROTONIX) 40 MG tablet Take 1 tablet (40 mg total) by mouth daily. 90 tablet 3   Propylene Glycol (SYSTANE COMPLETE) 0.6 % SOLN Place 1 drop into both eyes as needed (dry eyes).     silodosin (RAPAFLO) 8 MG  CAPS capsule Take 1 capsule (8 mg total) by mouth 2 (two) times daily. 60 capsule 11   No current facility-administered medications for this visit.    Review of Systems  Constitutional:  Positive for malaise/fatigue. Negative for weight loss.  Respiratory:  Positive for shortness of breath.   Cardiovascular:  Positive for chest pain.  Neurological: Negative.      PHYSICAL EXAMINATION: BP 135/89   Pulse 75   Resp 18   Ht 6\' 5"  (1.956 m)   Wt 211 lb (95.7 kg)   SpO2 98% Comment: RA  BMI 25.02 kg/m  Physical Exam Constitutional:      General: He is not in acute distress.    Appearance: Normal appearance. He is normal weight. He is not ill-appearing.  HENT:     Head: Normocephalic and atraumatic.  Eyes:     Extraocular Movements: Extraocular movements intact.  Cardiovascular:     Rate and Rhythm: Normal rate.  Pulmonary:     Effort: Pulmonary effort is normal. No respiratory distress.  Abdominal:     General: Abdomen is flat. There is no distension.  Musculoskeletal:        General: Normal range of motion.     Cervical back: Normal range of motion.  Skin:    General: Skin is warm and dry.  Neurological:     General: No focal deficit present.     Mental Status: He is alert and oriented to person, place, and time.     Diagnostic Studies & Laboratory data:     Recent Radiology Findings:   DG Chest Portable 1 View  Result Date: 02/27/2023 CLINICAL DATA:  Chest pain, shortness of breath, weakness  EXAM: PORTABLE CHEST 1 VIEW COMPARISON:  02/07/2023 FINDINGS: Stable findings of old granulomatous disease. Thoracic spondylosis. Cardiac and mediastinal margins appear normal. No blunting of the costophrenic angles. Small fiducial at the left lung base. IMPRESSION: 1. No acute findings. 2. Old granulomatous disease. 3. Thoracic spondylosis. Electronically Signed   By: Gaylyn Rong M.D.   On: 02/27/2023 16:38   EEG adult  Result Date: 02/09/2023 Charlsie Quest, MD     02/09/2023  2:31 PM Patient Name: Micheal Baker MRN: [id number] Epilepsy Attending: Charlsie Quest Referring Physician/Provider: Onnie Boer, MD Date: 02/09/2023 Duration: 23.07 mins Patient history: 77 year old male with transient alteration of awareness.  EEG to evaluate for seizure. Level of alertness: Awake, asleep AEDs during EEG study: None Technical aspects: This EEG study was done with scalp electrodes positioned according to the 10-20 International system of electrode placement. Electrical activity was reviewed with band pass filter of 1-70Hz , sensitivity of 7 uV/mm, display speed of 36mm/sec with a 60Hz  notched filter applied as appropriate. EEG data were recorded continuously and digitally stored.  Video monitoring was available and reviewed as appropriate. Description: The posterior dominant rhythm consists of 9 Hz activity of moderate voltage (25-35 uV) seen predominantly in posterior head regions, symmetric and reactive to eye opening and eye closing. Sleep was characterized by vertex waves, sleep spindles (12 to 14 Hz), maximal frontocentral region.  Hyperventilation and photic stimulation were not performed.   IMPRESSION: This study is within normal limits. No seizures or epileptiform discharges were seen throughout the recording. A normal interictal EEG does not exclude the diagnosis of epilepsy. Charlsie Quest   MR BRAIN WO CONTRAST  Result Date: 02/08/2023 CLINICAL DATA:  Altered mental status EXAM: MRI  HEAD WITHOUT CONTRAST TECHNIQUE: Multiplanar, multiecho pulse sequences of the brain  and surrounding structures were obtained without intravenous contrast. COMPARISON:  MR Head 05/12/21 FINDINGS: Brain: Negative for an acute infarct. No hydrocephalus. No acute hemorrhage. No extra-axial fluid collection. Sequela of mild chronic microvascular ischemic change. Vascular: Normal flow voids. Skull and upper cervical spine: Normal marrow signal. Sinuses/Orbits: Middle ear or mastoid effusion. Mild mucosal thickening bilateral ethmoid sinuses in the left maxillary sinus. Left lens replacement. Orbits are otherwise unremarkable. Other: None. IMPRESSION: No acute intracranial process. Electronically Signed   By: Lorenza Cambridge M.D.   On: 02/08/2023 13:46   DG Chest Port 1 View  Result Date: 02/07/2023 CLINICAL DATA:  Status post bronchoscopy EXAM: PORTABLE CHEST 1 VIEW COMPARISON:  None Available. FINDINGS: Low volume AP portable examination. Mild cardiomegaly. Biopsy marking clip projects over the left lung base. Both lungs are clear. The visualized skeletal structures are unremarkable. IMPRESSION: 1. Mild cardiomegaly without acute abnormality of the lungs in low volume AP portable examination. 2.  Biopsy marking clip projects over the left lung base. Electronically Signed   By: Jearld Lesch M.D.   On: 02/07/2023 11:22   DG C-ARM BRONCHOSCOPY  Result Date: 02/07/2023 C-ARM BRONCHOSCOPY: Fluoroscopy was utilized by the requesting physician.  No radiographic interpretation.       I have independently reviewed the above radiology studies  and reviewed the findings with the patient.   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  Atypical chest pain has already been worked up, but I will order a stress test.  We discussed the risks and benefits for a L LRATS, LLLectomy.  He is agreeable to proceed.     I  spent 40 minutes with  the patient face to face in counseling and coordination of care.    Corliss Skains 03/08/2023 12:23 PM

## 2023-03-08 ENCOUNTER — Other Ambulatory Visit: Payer: Self-pay | Admitting: *Deleted

## 2023-03-08 ENCOUNTER — Encounter: Payer: Self-pay | Admitting: *Deleted

## 2023-03-08 ENCOUNTER — Other Ambulatory Visit: Payer: Self-pay | Admitting: Thoracic Surgery (Cardiothoracic Vascular Surgery)

## 2023-03-08 ENCOUNTER — Institutional Professional Consult (permissible substitution): Payer: Medicare PPO | Admitting: Thoracic Surgery (Cardiothoracic Vascular Surgery)

## 2023-03-08 ENCOUNTER — Encounter (HOSPITAL_COMMUNITY): Payer: Self-pay | Admitting: Thoracic Surgery (Cardiothoracic Vascular Surgery)

## 2023-03-08 VITALS — BP 135/89 | HR 75 | Resp 18 | Ht 77.0 in | Wt 211.0 lb

## 2023-03-08 DIAGNOSIS — R911 Solitary pulmonary nodule: Secondary | ICD-10-CM

## 2023-03-09 NOTE — Telephone Encounter (Signed)
Spoke to patient and he was given detailed instructions about his test on 03/10/23 at 10:00.

## 2023-03-10 ENCOUNTER — Encounter: Payer: Medicare PPO | Admitting: Thoracic Surgery (Cardiothoracic Vascular Surgery)

## 2023-03-10 ENCOUNTER — Ambulatory Visit (HOSPITAL_COMMUNITY): Payer: Medicare PPO | Attending: Thoracic Surgery (Cardiothoracic Vascular Surgery)

## 2023-03-10 DIAGNOSIS — R911 Solitary pulmonary nodule: Secondary | ICD-10-CM | POA: Diagnosis not present

## 2023-03-10 DIAGNOSIS — Z01810 Encounter for preprocedural cardiovascular examination: Secondary | ICD-10-CM | POA: Diagnosis not present

## 2023-03-10 LAB — MYOCARDIAL PERFUSION IMAGING
LV dias vol: 141 mL (ref 62–150)
LV sys vol: 71 mL
Nuc Stress EF: 50 %
Peak HR: 86 {beats}/min
Rest HR: 58 {beats}/min
Rest Nuclear Isotope Dose: 11 mCi
SDS: 2
SRS: 0
SSS: 2
ST Depression (mm): 0 mm
Stress Nuclear Isotope Dose: 31.8 mCi
TID: 1.05

## 2023-03-10 MED ORDER — TECHNETIUM TC 99M TETROFOSMIN IV KIT
11.0000 | PACK | Freq: Once | INTRAVENOUS | Status: AC | PRN
  Administered 2023-03-10: 11 via INTRAVENOUS

## 2023-03-10 MED ORDER — TECHNETIUM TC 99M TETROFOSMIN IV KIT
31.8000 | PACK | Freq: Once | INTRAVENOUS | Status: AC | PRN
  Administered 2023-03-10: 31.8 via INTRAVENOUS

## 2023-03-10 MED ORDER — REGADENOSON 0.4 MG/5ML IV SOLN
0.4000 mg | Freq: Once | INTRAVENOUS | Status: AC
Start: 2023-03-10 — End: 2023-03-10
  Administered 2023-03-10: 0.4 mg via INTRAVENOUS

## 2023-03-17 NOTE — Progress Notes (Deleted)
PCP - Bertram Denver, NP Cardiologist - denies  PPM/ICD - denies   Chest x-ray - 03/17/23 EKG - 02/20/23 Stress Test - denies ECHO - denies Cardiac Cath - denies  Sleep Study - denies   DM- denies  ASA/Blood Thinner Instructions: n/a   ERAS Protcol - no, NPO   COVID TEST- n/a (verified no COVID test needed with Kai Levins, RN)   Anesthesia review: yes, pt had open wound at PAT. He said he dropped a piece of furniture when helping his grandmother move on Wednesday and the corner of the furniture punctured the chest mass. He said it was very painful and drained a "pussy, blood-tinged drainage." Pictures of the wound were taken and uploaded. Allyssa notified and she will show this to Dr. Laneta Simmers  Patient denies shortness of breath, fever, cough and chest pain at PAT appointment   All instructions explained to the patient, with a verbal understanding of the material. Patient agrees to go over the instructions while at home for a better understanding.  The opportunity to ask questions was provided.

## 2023-03-17 NOTE — Pre-Procedure Instructions (Signed)
Surgical Instructions    Your procedure is scheduled on Wednesday, July 10th.  Report to [facility] Main Entrance "A" at 09:35 A.M., then check in with the Admitting office.  Call this number if you have problems the morning of surgery:  [phone]  If you have any questions prior to your surgery date call [phone]: Open Monday-Friday 8am-4pm If you experience any cold or flu symptoms such as cough, fever, chills, shortness of breath, etc. between now and your scheduled surgery, please notify us at the above number.     Remember:  Do not eat or drink after midnight the night before your surgery     Take these medicines the morning of surgery with A SIP OF WATER  buPROPion (WELLBUTRIN XL)  escitalopram (LEXAPRO)  finasteride (PROSCAR)  levothyroxine (SYNTHROID)  pantoprazole (PROTONIX)  silodosin (RAPAFLO)    If needed: acetaminophen (TYLENOL)  fluticasone (FLONASE)  Propylene Glycol (SYSTANE COMPLETE) eye drops   As of today, STOP taking any Aspirin (unless otherwise instructed by your surgeon) Aleve, Naproxen, Ibuprofen, Motrin, Advil, diclofenac Sodium (VOLTAREN) gel, Goody's, BC's, all herbal medications, fish oil, and all vitamins.                     Do NOT Smoke (Tobacco/Vaping) for 24 hours prior to your procedure.  If you use a CPAP at night, you may bring your mask/headgear for your overnight stay.   Contacts, glasses, piercing's, hearing aid's, dentures or partials may not be worn into surgery, please bring cases for these belongings.    For patients admitted to the hospital, discharge time will be determined by your treatment team.   Patients discharged the day of surgery will not be allowed to drive home, and someone needs to stay with them for 24 hours.  SURGICAL WAITING ROOM VISITATION Patients having surgery or a procedure may have no more than 2 support people in the waiting area - these visitors may rotate.   Children under the age of 36 must have  an adult with them who is not the patient. If the patient needs to stay at the hospital during part of their recovery, the visitor guidelines for inpatient rooms apply. Pre-op nurse will coordinate an appropriate time for 1 support person to accompany patient in pre-op.  This support person may not rotate.   Please refer to the [facility] website for the visitor guidelines for Inpatients (after your surgery is over and you are in a regular room).    Special instructions:   [place]- Preparing For Surgery  Before surgery, you can play an important role. Because skin is not sterile, your skin needs to be as free of germs as possible. You can reduce the number of germs on your skin by washing with CHG (chlorahexidine gluconate) Soap before surgery.  CHG is an antiseptic cleaner which kills germs and bonds with the skin to continue killing germs even after washing.    Oral Hygiene is also important to reduce your risk of infection.  Remember - BRUSH YOUR TEETH THE MORNING OF SURGERY WITH YOUR REGULAR TOOTHPASTE  Please do not use if you have an allergy to CHG or antibacterial soaps. If your skin becomes reddened/irritated stop using the CHG.  Do not shave (including legs and underarms) for at least 48 hours prior to first CHG shower. It is OK to shave your face.  Please follow these instructions carefully.   Shower the NIGHT BEFORE SURGERY and the MORNING OF SURGERY  If  you chose to wash your hair, wash your hair first as usual with your normal shampoo.  After you shampoo, rinse your hair and body thoroughly to remove the shampoo.  Use CHG Soap as you would any other liquid soap. You can apply CHG directly to the skin and wash gently with a scrungie or a clean washcloth.   Apply the CHG Soap to your body ONLY FROM THE NECK DOWN.  Do not use on open wounds or open sores. Avoid contact with your eyes, ears, mouth and genitals (private parts). Wash Face and genitals (private parts)  with your  normal soap.   Wash thoroughly, paying special attention to the area where your surgery will be performed.  Thoroughly rinse your body with warm water from the neck down.  DO NOT shower/wash with your normal soap after using and rinsing off the CHG Soap.  Pat yourself dry with a CLEAN TOWEL.  Wear CLEAN PAJAMAS to bed the night before surgery  Place CLEAN SHEETS on your bed the night before your surgery  DO NOT SLEEP WITH PETS.   Day of Surgery: Take a shower with CHG soap. Do not wear jewelry or makeup Do not wear lotions, powders, perfumes/colognes, or deodorant. Do not shave 48 hours prior to surgery.  Men may shave face and neck. Do not bring valuables to the hospital.  [facility] is not responsible for any belongings or valuables. Do not wear nail polish, gel polish, artificial nails, or any other type of covering on natural nails (fingers and toes) If you have artificial nails or gel coating that need to be removed by a nail salon, please have this removed prior to surgery. Artificial nails or gel coating may interfere with anesthesia's ability to adequately monitor your vital signs. Wear Clean/Comfortable clothing the morning of surgery Remember to brush your teeth WITH YOUR REGULAR TOOTHPASTE.   Please read over the following fact sheets that you were given.    If you received a COVID test during your pre-op visit  it is requested that you wear a mask when out in public, stay away from anyone that may not be feeling well and notify your surgeon if you develop symptoms. If you have been in contact with anyone that has tested positive in the last 10 days please notify you surgeon.

## 2023-03-20 ENCOUNTER — Encounter (HOSPITAL_COMMUNITY)
Admission: RE | Admit: 2023-03-20 | Discharge: 2023-03-20 | Disposition: A | Discharge status: Home / Self Care | Payer: Medicare PPO | Source: Ambulatory Visit | Attending: Thoracic Surgery (Cardiothoracic Vascular Surgery) | Admitting: Thoracic Surgery (Cardiothoracic Vascular Surgery)

## 2023-03-20 ENCOUNTER — Encounter (HOSPITAL_COMMUNITY): Payer: Self-pay

## 2023-03-20 ENCOUNTER — Ambulatory Visit (HOSPITAL_COMMUNITY)
Admission: RE | Admit: 2023-03-20 | Discharge: 2023-03-20 | Disposition: A | Discharge status: Home / Self Care | Payer: Medicare PPO | Source: Ambulatory Visit | Attending: Thoracic Surgery (Cardiothoracic Vascular Surgery) | Admitting: Thoracic Surgery (Cardiothoracic Vascular Surgery)

## 2023-03-20 ENCOUNTER — Other Ambulatory Visit: Payer: Self-pay

## 2023-03-20 VITALS — BP 146/82 | HR 71 | Temp 98.2°F | Resp 17 | Ht 77.0 in | Wt 214.0 lb

## 2023-03-20 DIAGNOSIS — Z85118 Personal history of other malignant neoplasm of bronchus and lung: Secondary | ICD-10-CM | POA: Diagnosis not present

## 2023-03-20 DIAGNOSIS — Z1152 Encounter for screening for COVID-19: Secondary | ICD-10-CM | POA: Insufficient documentation

## 2023-03-20 DIAGNOSIS — Z79890 Hormone replacement therapy (postmenopausal): Secondary | ICD-10-CM | POA: Diagnosis not present

## 2023-03-20 DIAGNOSIS — Z48813 Encounter for surgical aftercare following surgery on the respiratory system: Secondary | ICD-10-CM | POA: Diagnosis not present

## 2023-03-20 DIAGNOSIS — F418 Other specified anxiety disorders: Secondary | ICD-10-CM | POA: Diagnosis not present

## 2023-03-20 DIAGNOSIS — C3490 Malignant neoplasm of unspecified part of unspecified bronchus or lung: Secondary | ICD-10-CM | POA: Diagnosis not present

## 2023-03-20 DIAGNOSIS — K5900 Constipation, unspecified: Secondary | ICD-10-CM | POA: Diagnosis present

## 2023-03-20 DIAGNOSIS — T797XXA Traumatic subcutaneous emphysema, initial encounter: Secondary | ICD-10-CM | POA: Diagnosis not present

## 2023-03-20 DIAGNOSIS — Z825 Family history of asthma and other chronic lower respiratory diseases: Secondary | ICD-10-CM | POA: Diagnosis not present

## 2023-03-20 DIAGNOSIS — R911 Solitary pulmonary nodule: Secondary | ICD-10-CM | POA: Insufficient documentation

## 2023-03-20 DIAGNOSIS — I10 Essential (primary) hypertension: Secondary | ICD-10-CM | POA: Diagnosis not present

## 2023-03-20 DIAGNOSIS — K219 Gastro-esophageal reflux disease without esophagitis: Secondary | ICD-10-CM | POA: Diagnosis present

## 2023-03-20 DIAGNOSIS — Z833 Family history of diabetes mellitus: Secondary | ICD-10-CM | POA: Diagnosis not present

## 2023-03-20 DIAGNOSIS — J9811 Atelectasis: Secondary | ICD-10-CM | POA: Diagnosis not present

## 2023-03-20 DIAGNOSIS — Z01818 Encounter for other preprocedural examination: Secondary | ICD-10-CM | POA: Insufficient documentation

## 2023-03-20 DIAGNOSIS — Z880 Allergy status to penicillin: Secondary | ICD-10-CM | POA: Diagnosis not present

## 2023-03-20 DIAGNOSIS — J90 Pleural effusion, not elsewhere classified: Secondary | ICD-10-CM | POA: Diagnosis not present

## 2023-03-20 DIAGNOSIS — I4891 Unspecified atrial fibrillation: Secondary | ICD-10-CM | POA: Diagnosis not present

## 2023-03-20 DIAGNOSIS — Z9889 Other specified postprocedural states: Secondary | ICD-10-CM | POA: Diagnosis not present

## 2023-03-20 DIAGNOSIS — Z8249 Family history of ischemic heart disease and other diseases of the circulatory system: Secondary | ICD-10-CM | POA: Diagnosis not present

## 2023-03-20 DIAGNOSIS — R0989 Other specified symptoms and signs involving the circulatory and respiratory systems: Secondary | ICD-10-CM | POA: Diagnosis not present

## 2023-03-20 DIAGNOSIS — Z79899 Other long term (current) drug therapy: Secondary | ICD-10-CM | POA: Diagnosis not present

## 2023-03-20 DIAGNOSIS — J982 Interstitial emphysema: Secondary | ICD-10-CM | POA: Diagnosis not present

## 2023-03-20 DIAGNOSIS — N400 Enlarged prostate without lower urinary tract symptoms: Secondary | ICD-10-CM | POA: Diagnosis not present

## 2023-03-20 DIAGNOSIS — Z4682 Encounter for fitting and adjustment of non-vascular catheter: Secondary | ICD-10-CM | POA: Diagnosis not present

## 2023-03-20 DIAGNOSIS — E039 Hypothyroidism, unspecified: Secondary | ICD-10-CM | POA: Diagnosis not present

## 2023-03-20 DIAGNOSIS — C3432 Malignant neoplasm of lower lobe, left bronchus or lung: Secondary | ICD-10-CM | POA: Diagnosis not present

## 2023-03-20 DIAGNOSIS — J95811 Postprocedural pneumothorax: Secondary | ICD-10-CM | POA: Diagnosis not present

## 2023-03-20 DIAGNOSIS — Z888 Allergy status to other drugs, medicaments and biological substances status: Secondary | ICD-10-CM | POA: Diagnosis not present

## 2023-03-20 DIAGNOSIS — J939 Pneumothorax, unspecified: Secondary | ICD-10-CM | POA: Diagnosis not present

## 2023-03-20 DIAGNOSIS — I440 Atrioventricular block, first degree: Secondary | ICD-10-CM | POA: Diagnosis present

## 2023-03-20 HISTORY — DX: Personal history of other diseases of the digestive system: Z87.19

## 2023-03-20 LAB — URINALYSIS, ROUTINE W REFLEX MICROSCOPIC
Bilirubin Urine: NEGATIVE
Glucose, UA: NEGATIVE mg/dL
Hgb urine dipstick: NEGATIVE
Ketones, ur: NEGATIVE mg/dL
Leukocytes,Ua: NEGATIVE
Nitrite: NEGATIVE
Protein, ur: NEGATIVE mg/dL
Specific Gravity, Urine: 1.006 (ref 1.005–1.030)
pH: 6 (ref 5.0–8.0)

## 2023-03-20 LAB — PROTIME-INR
INR: 1 (ref 0.8–1.2)
Prothrombin Time: 13.7 seconds (ref 11.4–15.2)

## 2023-03-20 LAB — CBC
HCT: 44.2 % (ref 39.0–52.0)
Hemoglobin: 14.8 g/dL (ref 13.0–17.0)
MCH: 29.9 pg (ref 26.0–34.0)
MCHC: 33.5 g/dL (ref 30.0–36.0)
MCV: 89.3 fL (ref 80.0–100.0)
Platelets: 229 10*3/uL (ref 150–400)
RBC: 4.95 MIL/uL (ref 4.22–5.81)
RDW: 12.7 % (ref 11.5–15.5)
WBC: 6.8 10*3/uL (ref 4.0–10.5)
nRBC: 0 % (ref 0.0–0.2)

## 2023-03-20 LAB — COMPREHENSIVE METABOLIC PANEL
ALT: 24 U/L (ref 0–44)
AST: 27 U/L (ref 15–41)
Albumin: 4 g/dL (ref 3.5–5.0)
Alkaline Phosphatase: 67 U/L (ref 38–126)
Anion gap: 8 (ref 5–15)
BUN: 19 mg/dL (ref 8–23)
CO2: 24 mmol/L (ref 22–32)
Calcium: 9.5 mg/dL (ref 8.9–10.3)
Chloride: 103 mmol/L (ref 98–111)
Creatinine, Ser: 0.94 mg/dL (ref 0.61–1.24)
GFR, Estimated: >60 mL/min (ref >60)
Glucose, Bld: 101 mg/dL — ABNORMAL HIGH (ref 70–99)
Potassium: 4.1 mmol/L (ref 3.5–5.1)
Sodium: 135 mmol/L (ref 135–145)
Total Bilirubin: 0.9 mg/dL (ref 0.3–1.2)
Total Protein: 7 g/dL (ref 6.5–8.1)

## 2023-03-20 LAB — TYPE AND SCREEN
ABO/RH(D): O POS
Antibody Screen: NEGATIVE

## 2023-03-20 LAB — APTT: aPTT: 26 seconds (ref 24–36)

## 2023-03-20 LAB — SARS CORONAVIRUS 2 (TAT 6-24 HRS): SARS Coronavirus 2: NEGATIVE

## 2023-03-20 LAB — SURGICAL PCR SCREEN
MRSA, PCR: NEGATIVE
Staphylococcus aureus: NEGATIVE

## 2023-03-20 NOTE — Progress Notes (Signed)
PCP - [name] Cardiologist - Dr. Marjo Bicker  PPM/ICD - Denis Device Orders - n/a Rep Notified - n/a  Chest x-ray - Pending 03-20-19 EKG - 02-27-2023 Stress Test - 03-10-2023 ECHO - 05-20-14 Cardiac Cath - 09-14-11  Sleep Study - 07-17-15  CPAP -  pressure at 5 per patient. Patient reports he "does not use his machine every night"      Blood Thinner Instructions:Denis Aspirin Instructions:N/A  ERAS Protcol -NPO PRE-SURGERY   COVID TEST- Pending 03-20-2023   Anesthesia review: Yes, recent chest pain Ed visit 09-28-22 denies any further complaints  Patient denies shortness of breath, fever, cough and chest pain at PAT appointment   All instructions explained to the patient, with a verbal understanding of the material. Patient agrees to go over the instructions while at home for a better understanding. The opportunity to ask questions was provided.

## 2023-03-22 ENCOUNTER — Encounter (HOSPITAL_COMMUNITY): Payer: Self-pay | Admitting: Thoracic Surgery (Cardiothoracic Vascular Surgery)

## 2023-03-22 ENCOUNTER — Other Ambulatory Visit: Payer: Self-pay

## 2023-03-22 ENCOUNTER — Inpatient Hospital Stay (HOSPITAL_COMMUNITY)
Admission: RE | Admit: 2023-03-22 | Discharge: 2023-03-26 | DRG: 164 | Disposition: A | Discharge status: Home / Self Care | Payer: Medicare PPO | Attending: Thoracic Surgery (Cardiothoracic Vascular Surgery) | Admitting: Thoracic Surgery (Cardiothoracic Vascular Surgery)

## 2023-03-22 ENCOUNTER — Inpatient Hospital Stay (HOSPITAL_COMMUNITY): Payer: Medicare PPO | Admitting: Anesthesiology

## 2023-03-22 ENCOUNTER — Encounter (HOSPITAL_COMMUNITY)
Admission: RE | Disposition: A | Discharge status: Home / Self Care | Payer: Self-pay | Source: Home / Self Care | Attending: Thoracic Surgery (Cardiothoracic Vascular Surgery)

## 2023-03-22 ENCOUNTER — Inpatient Hospital Stay (HOSPITAL_COMMUNITY): Payer: Medicare PPO

## 2023-03-22 ENCOUNTER — Inpatient Hospital Stay (HOSPITAL_COMMUNITY): Payer: Medicare PPO | Admitting: Physician Assistant

## 2023-03-22 DIAGNOSIS — I440 Atrioventricular block, first degree: Secondary | ICD-10-CM | POA: Diagnosis present

## 2023-03-22 DIAGNOSIS — Z880 Allergy status to penicillin: Secondary | ICD-10-CM

## 2023-03-22 DIAGNOSIS — E039 Hypothyroidism, unspecified: Secondary | ICD-10-CM

## 2023-03-22 DIAGNOSIS — J90 Pleural effusion, not elsewhere classified: Secondary | ICD-10-CM | POA: Diagnosis not present

## 2023-03-22 DIAGNOSIS — K219 Gastro-esophageal reflux disease without esophagitis: Secondary | ICD-10-CM | POA: Diagnosis present

## 2023-03-22 DIAGNOSIS — Z888 Allergy status to other drugs, medicaments and biological substances status: Secondary | ICD-10-CM | POA: Diagnosis not present

## 2023-03-22 DIAGNOSIS — Z85118 Personal history of other malignant neoplasm of bronchus and lung: Secondary | ICD-10-CM

## 2023-03-22 DIAGNOSIS — J982 Interstitial emphysema: Secondary | ICD-10-CM | POA: Diagnosis not present

## 2023-03-22 DIAGNOSIS — Z79890 Hormone replacement therapy (postmenopausal): Secondary | ICD-10-CM | POA: Diagnosis not present

## 2023-03-22 DIAGNOSIS — Z833 Family history of diabetes mellitus: Secondary | ICD-10-CM | POA: Diagnosis not present

## 2023-03-22 DIAGNOSIS — N400 Enlarged prostate without lower urinary tract symptoms: Secondary | ICD-10-CM | POA: Diagnosis present

## 2023-03-22 DIAGNOSIS — F418 Other specified anxiety disorders: Secondary | ICD-10-CM

## 2023-03-22 DIAGNOSIS — C3490 Malignant neoplasm of unspecified part of unspecified bronchus or lung: Secondary | ICD-10-CM | POA: Diagnosis not present

## 2023-03-22 DIAGNOSIS — Z79899 Other long term (current) drug therapy: Secondary | ICD-10-CM

## 2023-03-22 DIAGNOSIS — Z902 Acquired absence of lung [part of]: Principal | ICD-10-CM

## 2023-03-22 DIAGNOSIS — C3432 Malignant neoplasm of lower lobe, left bronchus or lung: Principal | ICD-10-CM | POA: Diagnosis present

## 2023-03-22 DIAGNOSIS — Z1152 Encounter for screening for COVID-19: Secondary | ICD-10-CM

## 2023-03-22 DIAGNOSIS — I10 Essential (primary) hypertension: Secondary | ICD-10-CM | POA: Diagnosis present

## 2023-03-22 DIAGNOSIS — Z825 Family history of asthma and other chronic lower respiratory diseases: Secondary | ICD-10-CM | POA: Diagnosis not present

## 2023-03-22 DIAGNOSIS — K5900 Constipation, unspecified: Secondary | ICD-10-CM | POA: Diagnosis present

## 2023-03-22 DIAGNOSIS — R911 Solitary pulmonary nodule: Secondary | ICD-10-CM

## 2023-03-22 DIAGNOSIS — Z8249 Family history of ischemic heart disease and other diseases of the circulatory system: Secondary | ICD-10-CM

## 2023-03-22 DIAGNOSIS — J95811 Postprocedural pneumothorax: Secondary | ICD-10-CM | POA: Diagnosis not present

## 2023-03-22 DIAGNOSIS — I4891 Unspecified atrial fibrillation: Secondary | ICD-10-CM | POA: Diagnosis not present

## 2023-03-22 HISTORY — PX: LYMPH NODE DISSECTION: SHX5087

## 2023-03-22 LAB — ABO/RH: ABO/RH(D): O POS

## 2023-03-22 SURGERY — LOBECTOMY, LUNG, ROBOT-ASSISTED, USING VATS
Anesthesia: General | Site: Chest | Laterality: Left

## 2023-03-22 MED ORDER — LUNG SURGERY BOOK
Freq: Once | Status: AC
  Filled 2023-03-22: qty 1

## 2023-03-22 MED ORDER — EPHEDRINE SULFATE-NACL 50-0.9 MG/10ML-% IV SOSY
PREFILLED_SYRINGE | INTRAVENOUS | Status: DC | PRN
  Administered 2023-03-22: 5 mg via INTRAVENOUS

## 2023-03-22 MED ORDER — LUNG SURGERY BOOK
Freq: Once | Status: DC
  Filled 2023-03-22: qty 1

## 2023-03-22 MED ORDER — CHLORHEXIDINE GLUCONATE 0.12 % MT SOLN
15.0000 mL | Freq: Once | OROMUCOSAL | Status: AC
  Administered 2023-03-22: 15 mL via OROMUCOSAL
  Filled 2023-03-22: qty 15

## 2023-03-22 MED ORDER — FINASTERIDE 5 MG PO TABS
5.0000 mg | ORAL_TABLET | Freq: Every day | ORAL | Status: DC
  Administered 2023-03-23 – 2023-03-26 (×4): 5 mg via ORAL
  Filled 2023-03-22 (×4): qty 1

## 2023-03-22 MED ORDER — BISACODYL 5 MG PO TBEC
10.0000 mg | DELAYED_RELEASE_TABLET | Freq: Every day | ORAL | Status: DC
  Administered 2023-03-22 – 2023-03-25 (×3): 10 mg via ORAL
  Filled 2023-03-22 (×5): qty 2

## 2023-03-22 MED ORDER — VANCOMYCIN HCL IN DEXTROSE 1-5 GM/200ML-% IV SOLN
1000.0000 mg | Freq: Two times a day (BID) | INTRAVENOUS | Status: AC
  Administered 2023-03-22: 1000 mg via INTRAVENOUS
  Filled 2023-03-22: qty 200

## 2023-03-22 MED ORDER — VANCOMYCIN HCL IN DEXTROSE 1-5 GM/200ML-% IV SOLN
1000.0000 mg | INTRAVENOUS | Status: AC
  Administered 2023-03-22: 1000 mg via INTRAVENOUS
  Filled 2023-03-22: qty 200

## 2023-03-22 MED ORDER — SODIUM CHLORIDE 0.9 % IV SOLN: INTRAVENOUS | Status: DC | PRN

## 2023-03-22 MED ORDER — ESCITALOPRAM OXALATE 10 MG PO TABS
20.0000 mg | ORAL_TABLET | Freq: Every day | ORAL | Status: DC
  Administered 2023-03-23 – 2023-03-26 (×4): 20 mg via ORAL
  Filled 2023-03-22 (×4): qty 2

## 2023-03-22 MED ORDER — FENTANYL CITRATE (PF) 250 MCG/5ML IJ SOLN
INTRAMUSCULAR | Status: DC | PRN
  Administered 2023-03-22 (×4): 50 ug via INTRAVENOUS

## 2023-03-22 MED ORDER — TAMSULOSIN HCL 0.4 MG PO CAPS
0.4000 mg | ORAL_CAPSULE | Freq: Every day | ORAL | Status: DC
  Administered 2023-03-22: 0.4 mg via ORAL
  Filled 2023-03-22: qty 1

## 2023-03-22 MED ORDER — SENNOSIDES-DOCUSATE SODIUM 8.6-50 MG PO TABS
1.0000 | ORAL_TABLET | Freq: Every day | ORAL | Status: DC
  Administered 2023-03-22 – 2023-03-25 (×4): 1 via ORAL
  Filled 2023-03-22 (×4): qty 1

## 2023-03-22 MED ORDER — TAMSULOSIN HCL 0.4 MG PO CAPS: 0.4000 mg | ORAL_CAPSULE | Freq: Every day | ORAL | Status: DC

## 2023-03-22 MED ORDER — ACETAMINOPHEN 160 MG/5ML PO SOLN: 1000.0000 mg | Freq: Four times a day (QID) | ORAL | Status: DC

## 2023-03-22 MED ORDER — BUPIVACAINE HCL (PF) 0.5 % IJ SOLN
INTRAMUSCULAR | Status: AC
  Filled 2023-03-22: qty 30

## 2023-03-22 MED ORDER — MORPHINE SULFATE (PF) 2 MG/ML IV SOLN: 2.0000 mg | INTRAVENOUS | Status: DC | PRN

## 2023-03-22 MED ORDER — LACTATED RINGERS IV SOLN: INTRAVENOUS | Status: DC | PRN

## 2023-03-22 MED ORDER — BUPIVACAINE LIPOSOME 1.3 % IJ SUSP
INTRAMUSCULAR | Status: AC
  Filled 2023-03-22: qty 20

## 2023-03-22 MED ORDER — HYDROMORPHONE HCL 1 MG/ML IJ SOLN
INTRAMUSCULAR | Status: AC
  Filled 2023-03-22: qty 1

## 2023-03-22 MED ORDER — BUPROPION HCL ER (XL) 150 MG PO TB24
150.0000 mg | ORAL_TABLET | Freq: Every day | ORAL | Status: DC
  Administered 2023-03-23 – 2023-03-26 (×4): 150 mg via ORAL
  Filled 2023-03-22 (×4): qty 1

## 2023-03-22 MED ORDER — GLYCOPYRROLATE PF 0.2 MG/ML IJ SOSY
PREFILLED_SYRINGE | INTRAMUSCULAR | Status: AC
  Filled 2023-03-22: qty 1

## 2023-03-22 MED ORDER — ALBUMIN HUMAN 5 % IV SOLN: INTRAVENOUS | Status: DC | PRN

## 2023-03-22 MED ORDER — ONDANSETRON HCL 4 MG/2ML IJ SOLN
INTRAMUSCULAR | Status: DC | PRN
  Administered 2023-03-22: 4 mg via INTRAVENOUS

## 2023-03-22 MED ORDER — PHENYLEPHRINE HCL-NACL 20-0.9 MG/250ML-% IV SOLN
INTRAVENOUS | Status: DC | PRN
  Administered 2023-03-22: 20 ug/min via INTRAVENOUS

## 2023-03-22 MED ORDER — ONDANSETRON HCL 4 MG/2ML IJ SOLN: 4.0000 mg | Freq: Once | INTRAMUSCULAR | Status: DC | PRN

## 2023-03-22 MED ORDER — SUGAMMADEX SODIUM 200 MG/2ML IV SOLN
INTRAVENOUS | Status: DC | PRN
  Administered 2023-03-22: 200 mg via INTRAVENOUS

## 2023-03-22 MED ORDER — OXYCODONE HCL 5 MG PO TABS
5.0000 mg | ORAL_TABLET | ORAL | Status: DC | PRN
  Administered 2023-03-22 – 2023-03-25 (×6): 10 mg via ORAL
  Filled 2023-03-22 (×6): qty 2

## 2023-03-22 MED ORDER — FENTANYL CITRATE (PF) 250 MCG/5ML IJ SOLN
INTRAMUSCULAR | Status: AC
  Filled 2023-03-22: qty 5

## 2023-03-22 MED ORDER — ORAL CARE MOUTH RINSE: 15.0000 mL | Freq: Once | OROMUCOSAL | Status: AC

## 2023-03-22 MED ORDER — SODIUM CHLORIDE FLUSH 0.9 % IV SOLN
INTRAVENOUS | Status: DC | PRN
  Administered 2023-03-22: 100 mL

## 2023-03-22 MED ORDER — OXYCODONE HCL 5 MG PO TABS
5.0000 mg | ORAL_TABLET | Freq: Once | ORAL | Status: AC | PRN
  Administered 2023-03-22: 5 mg via ORAL

## 2023-03-22 MED ORDER — LEVOTHYROXINE SODIUM 88 MCG PO TABS
88.0000 ug | ORAL_TABLET | Freq: Every day | ORAL | Status: DC
  Administered 2023-03-23 – 2023-03-26 (×4): 88 ug via ORAL
  Filled 2023-03-22 (×4): qty 1

## 2023-03-22 MED ORDER — ENOXAPARIN SODIUM 40 MG/0.4ML IJ SOSY: 40.0000 mg | PREFILLED_SYRINGE | Freq: Every day | INTRAMUSCULAR | Status: DC

## 2023-03-22 MED ORDER — DEXAMETHASONE SODIUM PHOSPHATE 10 MG/ML IJ SOLN
INTRAMUSCULAR | Status: DC | PRN
  Administered 2023-03-22: 10 mg via INTRAVENOUS

## 2023-03-22 MED ORDER — GLYCOPYRROLATE PF 0.2 MG/ML IJ SOSY
PREFILLED_SYRINGE | INTRAMUSCULAR | Status: DC | PRN
  Administered 2023-03-22: .2 mg via INTRAVENOUS

## 2023-03-22 MED ORDER — ROCURONIUM BROMIDE 10 MG/ML (PF) SYRINGE
PREFILLED_SYRINGE | INTRAVENOUS | Status: DC | PRN
  Administered 2023-03-22: 20 mg via INTRAVENOUS
  Administered 2023-03-22: 100 mg via INTRAVENOUS
  Administered 2023-03-22: 10 mg via INTRAVENOUS

## 2023-03-22 MED ORDER — PHENYLEPHRINE 80 MCG/ML (10ML) SYRINGE FOR IV PUSH (FOR BLOOD PRESSURE SUPPORT)
PREFILLED_SYRINGE | INTRAVENOUS | Status: DC | PRN
  Administered 2023-03-22 (×3): 80 ug via INTRAVENOUS

## 2023-03-22 MED ORDER — 0.9 % SODIUM CHLORIDE (POUR BTL) OPTIME
TOPICAL | Status: DC | PRN
  Administered 2023-03-22: 1000 mL

## 2023-03-22 MED ORDER — LIDOCAINE 2% (20 MG/ML) 5 ML SYRINGE
INTRAMUSCULAR | Status: DC | PRN
  Administered 2023-03-22: 100 mg via INTRAVENOUS

## 2023-03-22 MED ORDER — PANTOPRAZOLE SODIUM 40 MG PO TBEC
40.0000 mg | DELAYED_RELEASE_TABLET | Freq: Every day | ORAL | Status: DC
  Administered 2023-03-23 – 2023-03-26 (×4): 40 mg via ORAL
  Filled 2023-03-22 (×4): qty 1

## 2023-03-22 MED ORDER — PROPOFOL 10 MG/ML IV BOLUS
INTRAVENOUS | Status: AC
  Filled 2023-03-22: qty 20

## 2023-03-22 MED ORDER — PROPOFOL 10 MG/ML IV BOLUS
INTRAVENOUS | Status: DC | PRN
  Administered 2023-03-22: 200 mg via INTRAVENOUS

## 2023-03-22 MED ORDER — ACETAMINOPHEN 500 MG PO TABS
1000.0000 mg | ORAL_TABLET | Freq: Four times a day (QID) | ORAL | Status: DC
  Administered 2023-03-22 – 2023-03-26 (×16): 1000 mg via ORAL
  Filled 2023-03-22 (×15): qty 2

## 2023-03-22 MED ORDER — ONDANSETRON HCL 4 MG/2ML IJ SOLN: 4.0000 mg | Freq: Four times a day (QID) | INTRAMUSCULAR | Status: DC | PRN

## 2023-03-22 MED ORDER — HYDROMORPHONE HCL 1 MG/ML IJ SOLN
0.2500 mg | INTRAMUSCULAR | Status: DC | PRN
  Administered 2023-03-22 (×2): 0.5 mg via INTRAVENOUS

## 2023-03-22 MED ORDER — TRAMADOL HCL 50 MG PO TABS: 50.0000 mg | ORAL_TABLET | Freq: Four times a day (QID) | ORAL | Status: DC | PRN

## 2023-03-22 MED ORDER — LACTATED RINGERS IV SOLN: INTRAVENOUS | Status: DC

## 2023-03-22 MED ORDER — KETOROLAC TROMETHAMINE 15 MG/ML IJ SOLN
15.0000 mg | Freq: Four times a day (QID) | INTRAMUSCULAR | Status: AC
  Administered 2023-03-22 – 2023-03-24 (×8): 15 mg via INTRAVENOUS
  Filled 2023-03-22 (×8): qty 1

## 2023-03-22 MED ORDER — OXYCODONE HCL 5 MG/5ML PO SOLN: 5.0000 mg | Freq: Once | ORAL | Status: AC | PRN

## 2023-03-22 MED ORDER — OXYCODONE HCL 5 MG PO TABS
ORAL_TABLET | ORAL | Status: AC
  Filled 2023-03-22: qty 1

## 2023-03-22 SURGICAL SUPPLY — 94 items
ADH SKN CLS APL DERMABOND .7 (GAUZE/BANDAGES/DRESSINGS) ×1
APL PRP STRL LF DISP 70% ISPRP (MISCELLANEOUS) ×1
BAG SPEC RTRVL C1550 15 (MISCELLANEOUS) ×1
BLADE CLIPPER SURG (BLADE) ×1 IMPLANT
BLADE SURG 11 STRL SS (BLADE) ×1 IMPLANT
CANISTER SUCT 3000ML PPV (MISCELLANEOUS) ×2 IMPLANT
CANNULA REDUCER 12-8 DVNC XI (CANNULA) ×2 IMPLANT
CATH THORACIC 28FR (CATHETERS) ×1 IMPLANT
CHLORAPREP W/TINT 26 (MISCELLANEOUS) ×1 IMPLANT
CLIP TI MEDIUM 6 (CLIP) IMPLANT
CNTNR URN SCR LID CUP LEK RST (MISCELLANEOUS) ×5 IMPLANT
CONN ST 1/4X3/8 BEN (MISCELLANEOUS) IMPLANT
CONT SPEC 4OZ STRL OR WHT (MISCELLANEOUS) ×5
DEFOGGER SCOPE WARMER CLEARIFY (MISCELLANEOUS) ×1 IMPLANT
DERMABOND ADVANCED .7 DNX12 (GAUZE/BANDAGES/DRESSINGS) ×1 IMPLANT
DRAIN CHANNEL 28F RND 3/8 FF (WOUND CARE) IMPLANT
DRAPE ARM DVNC X/XI (DISPOSABLE) ×4 IMPLANT
DRAPE COLUMN DVNC XI (DISPOSABLE) ×1 IMPLANT
DRAPE CV SPLIT W-CLR ANES SCRN (DRAPES) ×1 IMPLANT
DRAPE ORTHO SPLIT 77X108 STRL (DRAPES) ×1
DRAPE SURG ORHT 6 SPLT 77X108 (DRAPES) ×1 IMPLANT
ELECT BLADE 6.5 EXT (BLADE) IMPLANT
ELECT REM PT RETURN 9FT ADLT (ELECTROSURGICAL) ×1
ELECTRODE REM PT RTRN 9FT ADLT (ELECTROSURGICAL) ×1 IMPLANT
FORCEPS BPLR LNG DVNC XI (INSTRUMENTS) IMPLANT
FORCEPS CADIERE DVNC XI (FORCEP) IMPLANT
GAUZE KITTNER 4X8 (MISCELLANEOUS) ×1 IMPLANT
GAUZE SPONGE 4X4 12PLY STRL (GAUZE/BANDAGES/DRESSINGS) ×1 IMPLANT
GLOVE BIO SURGEON STRL SZ7.5 (GLOVE) ×2 IMPLANT
GLOVE SURG POLYISO LF SZ8 (GLOVE) ×1 IMPLANT
GOWN STRL REUS W/ TWL LRG LVL3 (GOWN DISPOSABLE) ×2 IMPLANT
GOWN STRL REUS W/ TWL XL LVL3 (GOWN DISPOSABLE) ×2 IMPLANT
GOWN STRL REUS W/TWL 2XL LVL3 (GOWN DISPOSABLE) ×1 IMPLANT
GOWN STRL REUS W/TWL LRG LVL3 (GOWN DISPOSABLE) ×2
GOWN STRL REUS W/TWL XL LVL3 (GOWN DISPOSABLE) ×2
GRASPER TIP-UP FEN DVNC XI (INSTRUMENTS) IMPLANT
HEMOSTAT SURGICEL 2X14 (HEMOSTASIS) ×1 IMPLANT
IRRIGATION STRYKERFLOW (MISCELLANEOUS) IMPLANT
IRRIGATOR STRYKERFLOW (MISCELLANEOUS)
KIT BASIN OR (CUSTOM PROCEDURE TRAY) ×1 IMPLANT
KIT TURNOVER KIT B (KITS) ×1 IMPLANT
NDL 22X1.5 STRL (OR ONLY) (MISCELLANEOUS) ×1 IMPLANT
NEEDLE 22X1.5 STRL (OR ONLY) (MISCELLANEOUS) ×1 IMPLANT
NS IRRIG 1000ML POUR BTL (IV SOLUTION) ×3 IMPLANT
PACK CHEST (CUSTOM PROCEDURE TRAY) ×1 IMPLANT
PAD ARMBOARD 7.5X6 YLW CONV (MISCELLANEOUS) ×5 IMPLANT
RELOAD STAPLE 45 2.5 WHT DVNC (STAPLE) IMPLANT
RELOAD STAPLE 45 3.5 BLU DVNC (STAPLE) IMPLANT
RELOAD STAPLE 45 4.3 GRN DVNC (STAPLE) IMPLANT
RELOAD STAPLER 2.5X45 WHT DVNC (STAPLE) ×3 IMPLANT
RELOAD STAPLER 3.5X45 BLU DVNC (STAPLE) ×4 IMPLANT
RELOAD STAPLER 4.3X45 GRN DVNC (STAPLE) ×1 IMPLANT
SCISSORS LAP 5X35 DISP (ENDOMECHANICALS) IMPLANT
SEAL UNIV 5-12 XI (MISCELLANEOUS) ×4 IMPLANT
SEALANT PROGEL (MISCELLANEOUS) IMPLANT
SEALER LIGASURE MARYLAND 30 (ELECTROSURGICAL) IMPLANT
SET TRI-LUMEN FLTR TB AIRSEAL (TUBING) ×1 IMPLANT
SOL ELECTROSURG ANTI STICK (MISCELLANEOUS)
SOLUTION ELECTROSURG ANTI STCK (MISCELLANEOUS) IMPLANT
SPONGE INTESTINAL PEANUT (DISPOSABLE) IMPLANT
SPONGE TONSIL 1 RF SGL (DISPOSABLE) IMPLANT
STAPLER 45 SUREFORM CVD DVNC (STAPLE) IMPLANT
STAPLER RELOAD 2.5X45 WHT DVNC (STAPLE) ×3
STAPLER RELOAD 3.5X45 BLU DVNC (STAPLE) ×4
STAPLER RELOAD 4.3X45 GRN DVNC (STAPLE) ×1
STOPCOCK 4 WAY LG BORE MALE ST (IV SETS) ×1 IMPLANT
SUT MNCRL AB 3-0 PS2 18 (SUTURE) IMPLANT
SUT MON AB 2-0 CT1 36 (SUTURE) IMPLANT
SUT PDS AB 1 CTX 36 (SUTURE) IMPLANT
SUT PROLENE 4 0 RB 1 (SUTURE)
SUT PROLENE 4-0 RB1 .5 CRCL 36 (SUTURE) IMPLANT
SUT SILK 1 MH (SUTURE) ×1 IMPLANT
SUT SILK 1 TIES 10X30 (SUTURE) IMPLANT
SUT SILK 2 0 SH (SUTURE) IMPLANT
SUT SILK 2 0SH CR/8 30 (SUTURE) IMPLANT
SUT VIC AB 1 CTX 36 (SUTURE)
SUT VIC AB 1 CTX36XBRD ANBCTR (SUTURE) IMPLANT
SUT VIC AB 2-0 CT1 27 (SUTURE) ×1
SUT VIC AB 2-0 CT1 TAPERPNT 27 (SUTURE) ×1 IMPLANT
SUT VIC AB 3-0 SH 27 (SUTURE) ×2
SUT VIC AB 3-0 SH 27X BRD (SUTURE) ×2 IMPLANT
SUT VICRYL 0 TIES 12 18 (SUTURE) ×1 IMPLANT
SUT VICRYL 0 UR6 27IN ABS (SUTURE) ×2 IMPLANT
SUT VICRYL 2 TP 1 (SUTURE) IMPLANT
SYR 10ML LL (SYRINGE) ×1 IMPLANT
SYR 20ML LL LF (SYRINGE) ×1 IMPLANT
SYR 50ML LL SCALE MARK (SYRINGE) ×1 IMPLANT
SYSTEM RETRIEVAL ANCHOR 15 (MISCELLANEOUS) IMPLANT
SYSTEM SAHARA CHEST DRAIN ATS (WOUND CARE) ×1 IMPLANT
TAPE CLOTH 4X10 WHT NS (GAUZE/BANDAGES/DRESSINGS) ×1 IMPLANT
TIP APPLICATOR SPRAY EXTEND 16 (VASCULAR PRODUCTS) IMPLANT
TOWEL GREEN STERILE (TOWEL DISPOSABLE) ×1 IMPLANT
TRAY FOLEY MTR SLVR 16FR STAT (SET/KITS/TRAYS/PACK) ×1 IMPLANT
TUBING EXTENTION W/L.L. (IV SETS) ×1 IMPLANT

## 2023-03-22 NOTE — Anesthesia Procedure Notes (Addendum)
Procedure Name: Intubation Date/Time: 03/22/2023 12:26 PM  Performed by: Alease Medina, CRNAPre-anesthesia Checklist: Patient identified, Emergency Drugs available, Suction available, Patient being monitored and Timeout performed Patient Re-evaluated:Patient Re-evaluated prior to induction Oxygen Delivery Method: Circle system utilized Preoxygenation: Pre-oxygenation with 100% oxygen Induction Type: IV induction Ventilation: Mask ventilation without difficulty Laryngoscope Size: Glidescope and 4 Grade View: Grade I Endobronchial tube: Double lumen EBT, EBT position confirmed by auscultation and EBT position confirmed by fiberoptic bronchoscope and 39 Fr Number of attempts: 2 Airway Equipment and Method: Stylet and Video-laryngoscopy Placement Confirmation: positive ETCO2, ETT inserted through vocal cords under direct vision, CO2 detector and breath sounds checked- equal and bilateral Secured at: 31 cm Tube secured with: Tape Dental Injury: Teeth and Oropharynx as per pre-operative assessment  Comments: By Morrie Sheldon, SRNA

## 2023-03-22 NOTE — Anesthesia Procedure Notes (Signed)
Arterial Line Insertion Start/End7/06/2023 10:40 AM Performed by: Drema Pry, CRNA, CRNA  Patient location: Pre-op. Preanesthetic checklist: IV checked, risks and benefits discussed, monitors and equipment checked and pre-op evaluation Lidocaine 1% used for infiltration Right, radial was placed Hand hygiene performed  and maximum sterile barriers used   Attempts: 3 Procedure performed using ultrasound guided technique. Ultrasound Notes:needle tip was noted to be adjacent to the nerve/plexus identified and no ultrasound evidence of intravascular and/or intraneural injection Following insertion, dressing applied and Biopatch. Post procedure assessment: normal  Patient tolerated the procedure well with no immediate complications.

## 2023-03-22 NOTE — Hospital Course (Addendum)
Referring: Bevelyn Ngo, NP Primary Care: [name], MD Primary Cardiologist: Marjo Bicker, MD       Bronch 5/23, PET 5/23, Chest 5/1, MR Brain 5/29, PFT 6/21      History of Present Illness:    Micheal Baker 77 y.o. 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  He is a lifelong non-smoker, and this was found incidentally.  It initially was watched, and on surveillance scan, there was measurable growth.     He does complain of some fatigue that occurs at the end of the day.  He remains active, and is able to walk 30 minutes without any significant symptoms.  He denies any neurologic problems, or weight changes.     T1aN0M0 Stage Ia. Atypical chest pain has already been worked up. We also obtained a myocardial perfusion study that showed no evidence of ischemia or infarct.  We discussed the risks and benefits for a L LRATS, LLLectomy. He is agreeable to proceed.   Hospital Course: Micheal Baker was admitted for elective surgery on 03/22/2023 and taken to the operative room where robotic assisted left lower lobectomy was carried out by Dr. Cliffton Asters.  In addition, hilar and mediastinal lymph node sampling was conducted along with Exparel intercostal nerve block.  Following the procedure, he was extubated in the operating room and recovered in the postanesthesia care unit in stable condition.

## 2023-03-22 NOTE — Brief Op Note (Signed)
03/22/2023  2:09 PM  PATIENT:  Micheal Baker  77 y.o. male  PRE-OPERATIVE DIAGNOSIS:  LUNG CANCER  POST-OPERATIVE DIAGNOSIS:  LUNG CANCER  PROCEDURE: XI ROBOTIC ASSISTED THORACOSCOPY-LEFT LOWER LOBECTOMY  LYMPH NODE DISSECTION  LEFT INTERCOSTAL NERVE BLOCK  SURGEON:  Lightfoot, Eliezer Lofts, MD - Primary  PHYSICIAN ASSISTANTS: ZIMMERMAN /  Tremaine Earwood  ASSISTANTS:  Ferne Coe, RN, Scrub Person   ANESTHESIA:   general  EBL:  BLOOD ADMINISTERED:none  DRAINS: 28FR left pleural Blake drain  LOCAL MEDICATIONS USED:  Exparel  SPECIMEN:  Left lower lung lobe, multiple lymph nodes  DISPOSITION OF SPECIMEN:  PATHOLOGY  COUNTS:  Correct  DICTATION: .Dragon Dictation  PLAN OF CARE: Admit to inpatient   PATIENT DISPOSITION:  PACU - hemodynamically stable.   Delay start of Pharmacological VTE agent (>24hrs) due to surgical blood loss or risk of bleeding: yes

## 2023-03-22 NOTE — Interval H&P Note (Signed)
History and Physical Interval Note:  03/22/2023 11:09 AM  Micheal Baker  has presented today for surgery, with the diagnosis of LUNG CANCER.  The various methods of treatment have been discussed with the patient and family. After consideration of risks, benefits and other options for treatment, the patient has consented to  Procedure(s): XI ROBOTIC ASSISTED THORACOSCOPY-LEFT LOWER LOBECTOMY (Left) as a surgical intervention.  The patient's history has been reviewed, patient examined, no change in status, stable for surgery.  I have reviewed the patient's chart and labs.  Questions were answered to the patient's satisfaction.     Wasil Wolke Keane Scrape

## 2023-03-22 NOTE — Op Note (Signed)
      [address]       [place] [address]             [phone]        03/22/2023  Patient:  [name] Pre-Op Dx: Left Lower Lobe NSCLC   Post-op Dx:  same Procedure: - Robotic assisted left video thoracoscopy - left lower lobectomy - Mediastinal lymph node sampling - Intercostal nerve block  Surgeon and Role:      * Lazaria Schaben, Eliezer Lofts, MD - Primary  Assistant: Jacques Earthly, PA-C  An experienced assistant was required given the complexity of this surgery and the standard of surgical care. The assistant was needed for exposure, dissection, suctioning, retraction of delicate tissues and sutures, instrument exchange and for overall help during this procedure.    Anesthesia  general EBL:  50 ml Blood Administration: none Specimen:  left lower lobe, hilar and mediastinal nodes  Drains: 28 F argyle chest tube in left chest Counts: correct   Indications: 77yo male with LLL NSCLC. T1aN0M0 Stage Ia. Atypical chest pain has already been worked up, but I will order a stress test. We discussed the risks and benefits for a L LRATS, LLLectomy. He is agreeable to proceed.   Findings: Normal anatomy  Operative Technique: After the risks, benefits and alternatives were thoroughly discussed, the patient was brought to the operative theatre.  Anesthesia was induced, and the patient was then placed in a lateral decubitus position and was prepped and draped in normal sterile fashion.  An appropriate surgical pause was performed, and pre-operative antibiotics were dosed accordingly.  We began by placing our 4 robotic ports in the the 7th intercostal space targeting the hilum of the lung.  A 12mm assistant port was placed in the 9th intercostal space in the anterior axillary line.  The robot was then docked and all instruments were passed under direct visualization.    The lung was then retracted superiorly, and the inferior pulmonary ligament was divided.  The hilum was  mobilized anteriorly and posteriorly.  We identified the lower lobe pulmonary vein, and after careful isolation, it was divided with a vascular stapler.  We next moved to the pulmonary artery.  The artery was then divided with a vascular load stapler.  The bronchus to the lower lobe was then isolated.  After a test clamp, with good ventilation of the remaining lung, the bronchus was then divided.  The fissure was completed, and the specimen was passed into an endocatch bag.  It was removed from the anterior access site.    Lymph nodes were then sampled at hilum and mediastinum.  The chest was irrigated, and an air leak test was performed.  An intercostal nerve block was performed under direct visualization.  A 28 F chest tube was then placed, and we watch the remaining lobes re-expand.  The skin and soft tissue were closed with absorbable suture    The patient tolerated the procedure without any immediate complications, and was transferred to the PACU in stable condition.  Zymeir Salminen Keane Scrape

## 2023-03-22 NOTE — Transfer of Care (Signed)
Immediate Anesthesia Transfer of Care Note  Patient: Micheal Baker  Procedure(s) Performed: XI ROBOTIC ASSISTED THORACOSCOPY-LEFT LOWER LOBECTOMY (Left: Chest) LYMPH NODE DISSECTION (Left: Chest)  Patient Location: PACU  Anesthesia Type:General  Level of Consciousness: awake, alert , and oriented  Airway & Oxygen Therapy: Patient Spontanous Breathing and Patient connected to face mask oxygen  Post-op Assessment: Report given to RN and Post -op Vital signs reviewed and stable  Post vital signs: Reviewed and stable  Last Vitals:  Vitals Value Taken Time  BP 105/81 03/22/23 1500  Temp    Pulse 87 03/22/23 1501  Resp 16 03/22/23 1501  SpO2 98 % 03/22/23 1501  Vitals shown include unvalidated device data.  Last Pain:  Vitals:   03/22/23 0941  TempSrc:   PainSc: 0-No pain      Patients Stated Pain Goal: 0 (03/22/23 0941)  Complications: No notable events documented.

## 2023-03-22 NOTE — Anesthesia Preprocedure Evaluation (Addendum)
Anesthesia Evaluation  Patient identified by MRN, date of birth, ID band Patient awake    Reviewed: Allergy & Precautions, H&P , NPO status , Patient's Chart, lab work & pertinent test results  Airway Mallampati: III  TM Distance: <3 FB Neck ROM: Full    Dental no notable dental hx.    Pulmonary sleep apnea    Pulmonary exam normal breath sounds clear to auscultation       Cardiovascular hypertension, Normal cardiovascular exam Rhythm:Regular Rate:Normal     Neuro/Psych negative neurological ROS  negative psych ROS   GI/Hepatic Neg liver ROS,GERD  ,,  Endo/Other  Hypothyroidism    Renal/GU negative Renal ROS  negative genitourinary   Musculoskeletal negative musculoskeletal ROS (+)    Abdominal   Peds negative pediatric ROS (+)  Hematology negative hematology ROS (+)   Anesthesia Other Findings   Reproductive/Obstetrics negative OB ROS                             Anesthesia Physical Anesthesia Plan  ASA: 3  Anesthesia Plan: General   Post-op Pain Management: Ofirmev IV (intra-op)*   Induction: Intravenous  PONV Risk Score and Plan: 2 and Ondansetron, Dexamethasone and Treatment may vary due to age or medical condition  Airway Management Planned: Double Lumen EBT  Additional Equipment: ClearSight  Intra-op Plan:   Post-operative Plan: Extubation in OR  Informed Consent: I have reviewed the patients History and Physical, chart, labs and discussed the procedure including the risks, benefits and alternatives for the proposed anesthesia with the patient or authorized representative who has indicated his/her understanding and acceptance.     Dental advisory given  Plan Discussed with: CRNA and Surgeon  Anesthesia Plan Comments:        Anesthesia Quick Evaluation

## 2023-03-23 ENCOUNTER — Encounter (HOSPITAL_COMMUNITY): Payer: Self-pay | Admitting: Thoracic Surgery (Cardiothoracic Vascular Surgery)

## 2023-03-23 ENCOUNTER — Inpatient Hospital Stay (HOSPITAL_COMMUNITY): Payer: Medicare PPO

## 2023-03-23 LAB — CBC
HCT: 37.5 % — ABNORMAL LOW (ref 39.0–52.0)
Hemoglobin: 12.6 g/dL — ABNORMAL LOW (ref 13.0–17.0)
MCH: 29.2 pg (ref 26.0–34.0)
MCHC: 33.6 g/dL (ref 30.0–36.0)
MCV: 87 fL (ref 80.0–100.0)
Platelets: 200 10*3/uL (ref 150–400)
RBC: 4.31 MIL/uL (ref 4.22–5.81)
RDW: 12.5 % (ref 11.5–15.5)
WBC: 7.9 10*3/uL (ref 4.0–10.5)
nRBC: 0 % (ref 0.0–0.2)

## 2023-03-23 LAB — BASIC METABOLIC PANEL
Anion gap: 7 (ref 5–15)
BUN: 14 mg/dL (ref 8–23)
CO2: 25 mmol/L (ref 22–32)
Calcium: 8.4 mg/dL — ABNORMAL LOW (ref 8.9–10.3)
Chloride: 101 mmol/L (ref 98–111)
Creatinine, Ser: 1.06 mg/dL (ref 0.61–1.24)
GFR, Estimated: >60 mL/min (ref >60)
Glucose, Bld: 130 mg/dL — ABNORMAL HIGH (ref 70–99)
Potassium: 4.2 mmol/L (ref 3.5–5.1)
Sodium: 133 mmol/L — ABNORMAL LOW (ref 135–145)

## 2023-03-23 MED ORDER — SILODOSIN 8 MG PO CAPS
8.0000 mg | ORAL_CAPSULE | Freq: Two times a day (BID) | ORAL | Status: DC
  Administered 2023-03-23 – 2023-03-26 (×7): 8 mg via ORAL
  Filled 2023-03-23 (×9): qty 1

## 2023-03-23 MED ORDER — NON FORMULARY
8.0000 mg | Freq: Two times a day (BID) | Status: DC
Start: 2023-03-23 — End: 2023-03-23

## 2023-03-23 MED ORDER — SILODOSIN 8 MG PO CAPS: 8.0000 mg | ORAL_CAPSULE | Freq: Two times a day (BID) | ORAL | Status: DC

## 2023-03-23 MED ORDER — ENOXAPARIN SODIUM 40 MG/0.4ML IJ SOSY
40.0000 mg | PREFILLED_SYRINGE | Freq: Every day | INTRAMUSCULAR | Status: DC
  Administered 2023-03-23 – 2023-03-26 (×4): 40 mg via SUBCUTANEOUS
  Filled 2023-03-23 (×4): qty 0.4

## 2023-03-23 NOTE — Anesthesia Postprocedure Evaluation (Signed)
Anesthesia Post Note  Patient: Micheal Baker  Procedure(s) Performed: XI ROBOTIC ASSISTED THORACOSCOPY-LEFT LOWER LOBECTOMY (Left: Chest) LYMPH NODE DISSECTION (Left: Chest)     Patient location during evaluation: PACU Anesthesia Type: General Level of consciousness: awake and alert Pain management: pain level controlled Vital Signs Assessment: post-procedure vital signs reviewed and stable Respiratory status: spontaneous breathing, nonlabored ventilation, respiratory function stable and patient connected to nasal cannula oxygen Cardiovascular status: blood pressure returned to baseline and stable Postop Assessment: no apparent nausea or vomiting Anesthetic complications: no  No notable events documented.  Last Vitals:  Vitals:   03/23/23 1530 03/23/23 1930  BP: 129/77 131/87  Pulse: 70 69  Resp: 18 15  Temp: 36.7 C 36.6 C  SpO2: 93% 97%    Last Pain:  Vitals:   03/23/23 1930  TempSrc: Oral  PainSc: 6                  Kamaal Cast S

## 2023-03-23 NOTE — Plan of Care (Signed)

## 2023-03-23 NOTE — TOC CM/SW Note (Signed)
Transition of Care [facility]) - Inpatient Brief Assessment   Patient Details  Name: Micheal Baker MRN: [id number] Date of Birth: 1945-09-24  Transition of Care [facility]) CM/SW Contact:    Harriet Masson, RN Phone Number: 03/23/2023, 2:19 PM   Clinical Narrative:  1 Day Post-Op Procedure(s) (LRB): XI ROBOTIC ASSISTED THORACOSCOPY-LEFT LOWER LOBECTOMY. TOC following.  Transition of Care Asessment: Insurance and Status: Insurance coverage has been reviewed Patient has primary care physician: Yes Home environment has been reviewed: safe to discharge home when medically stable Prior level of function:: fine at home Prior/Current Home Services: No current home services Social Determinants of Health Reivew: SDOH reviewed no interventions necessary Readmission risk has been reviewed: Yes Transition of care needs: no transition of care needs at this time

## 2023-03-23 NOTE — Progress Notes (Signed)
Mobility Specialist Progress Note:   03/23/23 1104  Mobility  Activity Ambulated with assistance in hallway  Level of Assistance Contact guard assist, steadying assist  Assistive Device Front wheel walker  Distance Ambulated (ft) 250 ft  Activity Response Tolerated well  Mobility Referral Yes  $Mobility charge 1 Mobility  Mobility Specialist Start Time (ACUTE ONLY) 1037  Mobility Specialist Stop Time (ACUTE ONLY) 1058  Mobility Specialist Time Calculation (min) (ACUTE ONLY) 21 min    During Mobility: 79 HR ,  94% SpO2 Post Mobility: 73 HR , 96% SpO2  Pt received in bed, agreeable to mobility. Independent for bed mobility. CG during ambulation. Pt denied any feelings of pain or discomfort during session. Asymptomatic throughout. Pt returned to bed with call bell near and all needs met.   Leory Plowman  Mobility Specialist Please contact via [employer] office at [phone]

## 2023-03-23 NOTE — Discharge Summary (Addendum)
Physician Discharge Summary  Patient ID: Micheal Baker MRN: [id number] DOB/AGE: 1946-01-10 77 y.o.  Admit date: 03/22/2023 Discharge date: 03/26/2023  Admission Diagnoses:  Non-small cell lung cancer History of hypertension History of hypothyroidism History of benign prostatic hyperplasia  Discharge Diagnoses:   Non-small cell lung cancer Post op atrial fibrillation History of hypertension History of hypothyroidism History of benign prostatic hyperplasia S/P lobectomy of lung   Discharged Condition: Stable  History of Present Illness:    Micheal Baker 77 y.o. 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  He is a lifelong non-smoker, and this was found incidentally.  It initially was watched, and on surveillance scan, there was measurable growth.     He does complain of some fatigue that occurs at the end of the day.  He remains active, and is able to walk 30 minutes without any significant symptoms.  He denies any neurologic problems, or weight changes.     T1aN0M0 Stage Ia. Atypical chest pain has already been worked up. We also obtained a myocardial perfusion study that showed no evidence of ischemia or infarct.  We discussed the risks and benefits for a L LRATS, LLLectomy. He is agreeable to proceed.   Hospital Course: Micheal Baker was admitted for elective surgery on 03/22/2023 and taken to the operative room where robotic assisted left lower lobectomy was carried out by Dr. Cliffton Asters.  In addition, hilar and mediastinal lymph node sampling was conducted along with Exparel intercostal nerve block.  Following the procedure, he was extubated in the operating room and recovered in the postanesthesia care unit in stable condition.  Respiratory status remained stable.  Diet and activity were advanced routinely and well-tolerated.  He had no significant air leak.  The chest tube was left on waterseal chest x-ray was satisfactory with an expected left  apical space.  Chest tube was removed on postop day 2.  He was ambulating well on room air with good oxygenation. He went into a fib with RVR on Friday 07/12 and was put on an Amiodarone drip. He converted to sinus rhythm and was transitioned to oral Amiodarone. He was not a beta blocker and with HR mostly in the 60's per Dr. Cliffton Asters, was not started on one. He has maintained sinus rhythm. Wounds are clean, dry, healing without signs of infection. He has been tolerating a diet and has had a bowel movement. Because he is on Lexapro, as discussed with Dr. Cliffton Asters, will continue with Amiodarone 200 mg daily for one week (secondary to prolongation of QT with drug interaction). Chest x ray has remained stable. As discussed with Dr. Cliffton Asters, he is stable for discharge today.  Consults: None  Significant Diagnostic Studies:   Narrative & Impression  CLINICAL DATA:  Pneumothorax follow-up.   EXAM: CHEST - 2 VIEW   COMPARISON:  Chest x-ray from yesterday.   FINDINGS: The heart size and mediastinal contours are within normal limits. Normal pulmonary vascularity. Unchanged left basilar atelectasis and trace left pleural effusion. No pneumothorax. No acute osseous abnormality. Unchanged subcutaneous emphysema in the left-lateral chest wall.   IMPRESSION: 1. Unchanged left basilar atelectasis and trace left pleural effusion. No pneumothorax.     Electronically Signed   By: Obie Dredge M.D.   On: 03/26/2023 08:29   CLINICAL DATA:  [id number] S/P lobectomy of lung [id number]   EXAM: PORTABLE CHEST 1 VIEW   COMPARISON:  03/22/2023.   FINDINGS: Low lung volume. Trace left apical  pneumothorax again seen, essentially similar to the prior study. Left pleural drainage catheter noted.   Bilateral lungs are otherwise grossly clear. Stable calcified mediastinal and left hilar lymph nodes. Bilateral costophrenic angles are also clear.   Normal cardio-mediastinal silhouette.   No acute  osseous abnormalities.   Small amount of surgical emphysema noted along the left lower lateral chest wall. The soft tissues are otherwise within normal limits.   IMPRESSION: 1. Trace left apical pneumothorax, essentially similar to the prior study. Left pleural drainage catheter in place.     Electronically Signed   By: Jules Schick M.D.   On: 03/23/2023 08:50  Surgical Pathology:   FINAL MICROSCOPIC DIAGNOSIS:  A. LYMPH NODE, LEVEL 9, EXCISION:      One lymph node, with necrosis and calcifications, negative for metastatic carcinoma (0/1).  B. LYMPH NODE, HILAR, EXCISION:      One lymph node, with necrosis and calcifications, negative for metastatic carcinoma (0/1).  C. LYMPH NODE, HILAR #2, EXCISION:      One lymph node, with necrosis and calcifications, negative for metastatic carcinoma (0/1).  D. LYMPH NODE, HILAR #3, EXCISION:      One lymph node, negative for metastatic carcinoma (0/1).  E. LYMPH NODE, HILAR #4, EXCISION:      One lymph node, negative for metastatic carcinoma (0/1).  F. LYMPH NODE, LEVEL 6, EXCISION:      One lymph node, with necrosis and calcifications, negative for metastatic carcinoma (0/1).  G. LUNG, LEFT LOWER LOBE, LOBECTOMY:      Invasive adenocarcinoma, acinar (80%) and solid (20%) patterns.      Tumor size: 1.0 x 0.8 x 0.7 cm.      Bronchial margin and vascular margin are negative for carcinoma.      Focal pleural involvement.      Spreading through airway identified.      Background pulmonary parenchyma with foci of necrotic nodules with calcifications.      One lymph node, with necrosis and calcifications, negative for metastatic carcinoma (0/1).      See oncology table. TNM Code: pT1a, pN0   Treatments: Surgery   03/22/2023   Patient:  Micheal Baker Pre-Op Dx: Left Lower Lobe NSCLC   Post-op Dx:  same Procedure: - Robotic assisted left video thoracoscopy - left lower lobectomy - Mediastinal lymph node sampling -  Intercostal nerve block   Surgeon and Role:      * Lightfoot, Eliezer Lofts, MD - Primary   Assistant: Jacques Earthly, PA-C   Anesthesia  general EBL:  50 ml Blood Administration: none Specimen:  left lower lobe, hilar and mediastinal nodes   Drains: 28 F argyle chest tube in left chest Counts: correct     Indications: 77yo male with LLL NSCLC. T1aN0M0 Stage Ia. Atypical chest pain has already been worked up, but I will order a stress test. We discussed the risks and benefits for a L LRATS, LLLectomy. He is agreeable to proceed.    Findings: Normal anatomy  Discharge Exam: Blood pressure (!) 145/93, pulse 65, temperature 98.2 F (36.8 C), temperature source Oral, resp. rate 11, height 6\' 5"  (1.956 m), weight 96.9 kg, SpO2 95%.  General appearance: alert, cooperative, and no distress Neurologic: intact Heart: Normal sinus rhythm Lungs: Normal work of breathing, oxygen saturation 97% on room air. Wound: Port incisions are all dry and intact  Disposition: Discharged to home in stable condition   Allergies as of 03/26/2023       Reactions  Avelox [moxifloxacin] Other (See Comments)   Caused tendonitis   Penicillins Other (See Comments)   Chills        Medication List     TAKE these medications    acetaminophen 500 MG tablet Commonly known as: TYLENOL Take 500-1,000 mg by mouth every 6 (six) hours as needed for mild pain, headache or moderate pain.   ALPHA LIPOIC ACID PO Take 1 capsule by mouth in the morning.   amiodarone 200 MG tablet Commonly known as: PACERONE Take 1 tablet (200 mg total) by mouth daily for 7 days.   B-complex with vitamin C tablet Take 1 tablet by mouth in the morning.   buPROPion 150 MG 24 hr tablet Commonly known as: WELLBUTRIN XL Take 150 mg by mouth daily.   calcium carbonate 500 MG chewable tablet Commonly known as: TUMS - dosed in mg elemental calcium Chew 1 tablet (200 mg of elemental calcium total) by mouth 3 (three) times  daily with meals. What changed:  when to take this reasons to take this   doxylamine (Sleep) 25 MG tablet Commonly known as: UNISOM Take 25 mg by mouth at bedtime as needed for sleep.   escitalopram 20 MG tablet Commonly known as: LEXAPRO Take 20 mg by mouth daily.   finasteride 5 MG tablet Commonly known as: PROSCAR Take 1 tablet (5 mg total) by mouth daily.   fluticasone 50 MCG/ACT nasal spray Commonly known as: FLONASE Place 1 spray into both nostrils daily as needed for allergies.   levothyroxine 88 MCG tablet Commonly known as: Synthroid Take 1 tablet (88 mcg total) by mouth daily before breakfast.   pantoprazole 40 MG tablet Commonly known as: PROTONIX Take 1 tablet (40 mg total) by mouth daily.   silodosin 8 MG Caps capsule Commonly known as: RAPAFLO Take 1 capsule (8 mg total) by mouth 2 (two) times daily.   Systane Complete 0.6 % Soln Generic drug: Propylene Glycol Place 1 drop into both eyes as needed (dry eyes).   traMADol 50 MG tablet Commonly known as: ULTRAM Take 1 tablet (50 mg total) by mouth every 6 (six) hours as needed for up to 7 days (mild pain).   Voltaren 1 % Gel Generic drug: diclofenac Sodium Apply 1 Application topically 4 (four) times daily as needed (knee pain).        Follow-up Information     Corliss Skains, MD. Go on 03/31/2023.   Specialty: Cardiothoracic Surgery Why: Your appointment is at 10:20am. Contact information: [address] [place] [address] [phone]                 Signed: Elenore Rota 03/26/2023, 10:23 AM

## 2023-03-23 NOTE — Discharge Instructions (Addendum)
Robot-Assisted Thoracic Surgery, Care After The following information offers guidance on how to care for yourself after your procedure. Your health care provider may also give you more specific instructions. If you have problems or questions, contact your health care provider. What can I expect after the procedure? After the procedure, it is common to have: Some pain and aches in the area of your surgical incisions. Pain when breathing in (inhaling) and coughing. Tiredness (fatigue). Trouble sleeping. Constipation. Follow these instructions at home: Medicines Take over-the-counter and prescription medicines only as told by your health care provider. If you were prescribed an antibiotic medicine, take it as told by your health care provider. Do not stop taking the antibiotic even if you start to feel better. Talk with your health care provider about safe and effective ways to manage pain after your procedure. Pain management should fit your specific health needs. Take pain medicine before pain becomes severe. Relieving and controlling your pain will make breathing easier for you. Ask your health care provider if the medicine prescribed to you requires you to avoid driving or using machinery. Eating and drinking Follow instructions from your health care provider about eating or drinking restrictions. These will vary depending on what procedure you had. Your health care provider may recommend: A liquid diet or soft diet for the first few days. Meals that are smaller and more frequent. A diet of fruits, vegetables, whole grains, and low-fat proteins. Limiting foods that are high in fat and processed sugar, including fried or sweet foods. Incision care Follow instructions from your health care provider about how to take care of your incisions. Make sure you: Wash your hands with soap and water for at least 20 seconds before and after you change your bandage (dressing). If soap and water are not  available, use hand sanitizer. Change your dressing as told by your health care provider. Leave stitches (sutures), skin glue, or adhesive strips in place. These skin closures may need to stay in place for 2 weeks or longer. If adhesive strip edges start to loosen and curl up, you may trim the loose edges. Do not remove adhesive strips completely unless your health care provider tells you to do that. Check your incision area every day for signs of infection. Check for: Redness, swelling, or more pain. Fluid or blood. Warmth. Pus or a bad smell. Activity Return to your normal activities as told by your health care provider. Ask your health care provider what activities are safe for you. Ask your health care provider when it is safe for you to drive. Do not lift anything that is heavier than 10 lb (4.5 kg), or the limit that you are told, until your health care provider says that it is safe. Rest as told by your health care provider. Avoid sitting for a long time without moving. Get up to take short walks every 1-2 hours. This is important to improve blood flow and breathing. Ask for help if you feel weak or unsteady. Do exercises as told by your health care provider. Pneumonia prevention  Do deep breathing exercises and cough regularly as directed. This helps clear mucus and opens your lungs. Doing this helps prevent lung infection (pneumonia). If you were given an incentive spirometer, use it as told. An incentive spirometer is a tool that measures how well you are filling your lungs with each breath. Coughing may hurt less if you try to support your chest. This is called splinting. Try one of these when you   cough: Hold a pillow against your chest. Place the palms of both hands on top of your incision area. Do not use any products that contain nicotine or tobacco. These products include cigarettes, chewing tobacco, and vaping devices, such as e-cigarettes. If you need help quitting, ask your  health care provider. Avoid secondhand smoke. General instructions If you have a drainage tube: Follow instructions from your health care provider about how to take care of it. Do not travel by airplane after your tube is removed until your health care provider tells you it is safe. You may need to take these actions to prevent or treat constipation: Drink enough fluid to keep your urine pale yellow. Take over-the-counter or prescription medicines. Eat foods that are high in fiber, such as beans, whole grains, and fresh fruits and vegetables. Limit foods that are high in fat and processed sugars, such as fried or sweet foods. Keep all follow-up visits. This is important. Contact a health care provider if: You have redness, swelling, or more pain around an incision. You have fluid or blood coming from an incision. An incision feels warm to the touch. You have pus or a bad smell coming from an incision. You have a fever. You cannot eat or drink without vomiting. Your pain medicine is not controlling your pain. Get help right away if: You have chest pain. Your heart is beating quickly. You have trouble breathing. You have trouble speaking. You are confused. You feel weak or dizzy, or you faint. These symptoms may represent a serious problem that is an emergency. Do not wait to see if the symptoms will go away. Get medical help right away. Call your local emergency services (911 in the [place] Do not drive yourself to the hospital. Summary Talk with your health care provider about safe and effective ways to manage pain after your procedure. Pain management should fit your specific health needs. Return to your normal activities as told by your health care provider. Ask your health care provider what activities are safe for you. Do deep breathing exercises and cough regularly as directed. This helps to clear mucus and prevent pneumonia. If it hurts to cough, ease pain by holding a pillow  against your chest or by placing the palms of both hands over your incisions. This information is not intended to replace advice given to you by your health care provider. Make sure you discuss any questions you have with your health care provider. Document Revised: 05/22/2020 Document Reviewed: 05/22/2020 Elsevier Patient Education  2024 [employer] Inc[employer] 

## 2023-03-23 NOTE — Progress Notes (Addendum)
      [address]       [place] [address]             [phone]      1 Day Post-Op Procedure(s) (LRB): XI ROBOTIC ASSISTED THORACOSCOPY-LEFT LOWER LOBECTOMY (Left) LYMPH NODE DISSECTION (Left) Subjective: Sitting up in bed eating breakfast.  No nausea.  Says pain is well-controlled.  He has taken a walk.  Objective: Vital signs in last 24 hours: Temp:  [97.3 F (36.3 C)-98.3 F (36.8 C)] 97.7 F (36.5 C) (07/11 0426) Pulse Rate:  [67-95] 72 (07/11 0426) Cardiac Rhythm: Normal sinus rhythm (07/10 1904) Resp:  [10-21] 14 (07/11 0426) BP: (105-141)/(66-92) 116/74 (07/11 0426) SpO2:  [91 %-99 %] 96 % (07/11 0426) Arterial Line BP: (133-148)/(64-68) 143/65 (07/10 1600) Weight:  [96.9 kg-97.1 kg] 96.9 kg (07/10 1650)  Hemodynamic parameters for last 24 hours:    Intake/Output from previous day: 07/10 0701 - 07/11 0700 In: 3700 [P.O.:600; I.V.:2650; IV Piggyback:450] Out: 1520 [Urine:1300; Blood:30; Chest Tube:190] Intake/Output this shift: No intake/output data recorded.  General appearance: alert, cooperative, and no distress Neurologic: intact Heart: Normal sinus rhythm Lungs: Normal work of breathing, oxygen saturation 98% on room air.  Chest tube drainage about 200 mL since surgery.  The chest x-ray shows an expected left apical space.  Chest tube in good position.  Had a few air bubbles with initial cough but I do not see an air leak after that. Wound: Port incisions are all dry and intact  Lab Results: Recent Labs    03/20/23 1337 03/23/23 0037  WBC 6.8 7.9  HGB 14.8 12.6*  HCT 44.2 37.5*  PLT 229 200   BMET:  Recent Labs    03/20/23 1337 03/23/23 0037  NA 135 133*  K 4.1 4.2  CL 103 101  CO2 24 25  GLUCOSE 101* 130*  BUN 19 14  CREATININE 0.94 1.06  CALCIUM 9.5 8.4*    PT/INR:  Recent Labs    03/20/23 1337  LABPROT 13.7  INR 1.0   ABG    Component Value Date/Time   TCO2 23 06/27/2014 0642   CBG (last 3)  No results  for input(s): "GLUCAP" in the last 72 hours.  Assessment/Plan: S/P Procedure(s) (LRB): XI ROBOTIC ASSISTED THORACOSCOPY-LEFT LOWER LOBECTOMY (Left) LYMPH NODE DISSECTION (Left)  -Postop day 1 robotic assisted left lower lobectomy for biopsy-proven non-small cell lung cancer.  Stable respiratory status, on room air.  Pain well-controlled with current regimen.  Very small air leak seen only with initial cough.  And discussed possible chest tube removal later with Dr. Cliffton Asters. Mobilize.  -History of hypothyroidism: Levothyroxine resumed  -History of BPH- on Proscar and Flomax    LOS: 1 day    Leary Roca, PA-C 03/23/2023  Good tidaling IS, ambulation today  Kyng Matlock Keane Scrape

## 2023-03-24 ENCOUNTER — Other Ambulatory Visit: Payer: Self-pay

## 2023-03-24 ENCOUNTER — Inpatient Hospital Stay (HOSPITAL_COMMUNITY): Payer: Medicare PPO

## 2023-03-24 LAB — COMPREHENSIVE METABOLIC PANEL
ALT: 21 U/L (ref 0–44)
AST: 25 U/L (ref 15–41)
Albumin: 3.4 g/dL — ABNORMAL LOW (ref 3.5–5.0)
Alkaline Phosphatase: 45 U/L (ref 38–126)
Anion gap: 14 (ref 5–15)
BUN: 16 mg/dL (ref 8–23)
CO2: 25 mmol/L (ref 22–32)
Calcium: 8.7 mg/dL — ABNORMAL LOW (ref 8.9–10.3)
Chloride: 99 mmol/L (ref 98–111)
Creatinine, Ser: 0.99 mg/dL (ref 0.61–1.24)
GFR, Estimated: >60 mL/min (ref >60)
Glucose, Bld: 107 mg/dL — ABNORMAL HIGH (ref 70–99)
Potassium: 4.3 mmol/L (ref 3.5–5.1)
Sodium: 138 mmol/L (ref 135–145)
Total Bilirubin: 0.9 mg/dL (ref 0.3–1.2)
Total Protein: 5.9 g/dL — ABNORMAL LOW (ref 6.5–8.1)

## 2023-03-24 LAB — MAGNESIUM: Magnesium: 2 mg/dL (ref 1.7–2.4)

## 2023-03-24 LAB — CBC
HCT: 36.8 % — ABNORMAL LOW (ref 39.0–52.0)
Hemoglobin: 12.4 g/dL — ABNORMAL LOW (ref 13.0–17.0)
MCH: 29.3 pg (ref 26.0–34.0)
MCHC: 33.7 g/dL (ref 30.0–36.0)
MCV: 87 fL (ref 80.0–100.0)
Platelets: 183 10*3/uL (ref 150–400)
RBC: 4.23 MIL/uL (ref 4.22–5.81)
RDW: 12.6 % (ref 11.5–15.5)
WBC: 5.6 10*3/uL (ref 4.0–10.5)
nRBC: 0 % (ref 0.0–0.2)

## 2023-03-24 MED ORDER — TRAMADOL HCL 50 MG PO TABS: 50.0000 mg | ORAL_TABLET | Freq: Four times a day (QID) | ORAL | 0 refills | Status: DC | PRN

## 2023-03-24 MED ORDER — AMIODARONE HCL IN DEXTROSE 360-4.14 MG/200ML-% IV SOLN
60.0000 mg/h | INTRAVENOUS | Status: DC
  Administered 2023-03-24 (×2): 60 mg/h via INTRAVENOUS
  Filled 2023-03-24 (×2): qty 200

## 2023-03-24 MED ORDER — AMIODARONE HCL IN DEXTROSE 360-4.14 MG/200ML-% IV SOLN
30.0000 mg/h | INTRAVENOUS | Status: DC
  Administered 2023-03-24 – 2023-03-25 (×3): 30 mg/h via INTRAVENOUS
  Filled 2023-03-24 (×2): qty 200

## 2023-03-24 MED ORDER — AMIODARONE LOAD VIA INFUSION
150.0000 mg | Freq: Once | INTRAVENOUS | Status: AC
  Administered 2023-03-24: 150 mg via INTRAVENOUS
  Filled 2023-03-24: qty 83.34

## 2023-03-24 NOTE — Progress Notes (Signed)
Pt converted to NSR @ 18:58. V/S stable. EKG done to confirmed. Dr Cliffton Asters informed. Will continue Amiodarone gtt as per Dr Cliffton Asters.

## 2023-03-24 NOTE — Plan of Care (Signed)

## 2023-03-24 NOTE — Progress Notes (Addendum)
Mobility Specialist Progress Note:   03/24/23 1115  Mobility  Activity Ambulated with assistance in hallway  Level of Assistance Contact guard assist, steadying assist  Assistive Device Front wheel walker  Distance Ambulated (ft) 250 ft  Activity Response Tolerated well  Mobility Referral Yes  $Mobility charge 1 Mobility  Mobility Specialist Start Time (ACUTE ONLY) 1030  Mobility Specialist Stop Time (ACUTE ONLY) 1045  Mobility Specialist Time Calculation (min) (ACUTE ONLY) 15 min    During Mobility: 120-134 HR  Post Mobility: 113 HR , 98% SpO2  Pt received in bed, agreeable to mobility. C/o slight unsteadiness in balance during ambulation, otherwise asymptomatic throughout. Pt returned to bed with call bell near and all needs met. Family present.  Leory Plowman  Mobility Specialist Please contact via [employer] office at [phone]

## 2023-03-24 NOTE — Progress Notes (Addendum)
*  ADDENDUM 09:01am:  Mr. Moreau developed atrial fibrillation with RVR in 120's after rounds this morning. He is tolerating this well with stable BP.  K+ 4.3.  Will treat with IV amiodarone loading, check Mg++ level. Cancel discharge plans.   Gaynelle Arabian, PA-C        [address]       [place] [address]             [phone]      2 Days Post-Op Procedure(s) (LRB): XI ROBOTIC ASSISTED THORACOSCOPY-LEFT LOWER LOBECTOMY (Left) LYMPH NODE DISSECTION (Left) Subjective: Sitting up in bed, ate full breakfast.  No nausea.  Says pain is well-controlled.    Objective: Vital signs in last 24 hours: Temp:  [97.6 F (36.4 C)-99 F (37.2 C)] 97.7 F (36.5 C) (07/12 0522) Pulse Rate:  [64-73] 69 (07/11 1930) Cardiac Rhythm: Normal sinus rhythm (07/11 2004) Resp:  [13-18] 15 (07/11 1930) BP: (116-142)/(72-98) 142/98 (07/11 2310) SpO2:  [93 %-97 %] 97 % (07/11 1930)     Intake/Output from previous day: 07/11 0701 - 07/12 0700 In: 720 [P.O.:720] Out: 4010 [Urine:3800; Chest Tube:210] Intake/Output this shift: No intake/output data recorded.  General appearance: alert, cooperative, and no distress Neurologic: intact Heart: Normal sinus rhythm Lungs: Normal work of breathing, oxygen saturation 97% on room air.  Chest tube drainage about 200 mL past 24h.  The chest x-ray shows an expected left apical space.  Chest tube in good position.  No air leak. Wound: Port incisions are all dry and intact  Lab Results: Recent Labs    03/23/23 0037 03/24/23 0021  WBC 7.9 5.6  HGB 12.6* 12.4*  HCT 37.5* 36.8*  PLT 200 183   BMET:  Recent Labs    03/23/23 0037 03/24/23 0021  NA 133* 138  K 4.2 4.3  CL 101 99  CO2 25 25  GLUCOSE 130* 107*  BUN 14 16  CREATININE 1.06 0.99  CALCIUM 8.4* 8.7*    PT/INR:  No results for input(s): "LABPROT", "INR" in the last 72 hours.  ABG    Component Value Date/Time   TCO2 23 06/27/2014 0642   CBG (last 3)  No results  for input(s): "GLUCAP" in the last 72 hours.  Assessment/Plan: S/P Procedure(s) (LRB): XI ROBOTIC ASSISTED THORACOSCOPY-LEFT LOWER LOBECTOMY (Left) LYMPH NODE DISSECTION (Left)  -Postop day 2 robotic assisted left lower lobectomy for biopsy-proven non-small cell lung cancer.  Stable respiratory status, on room air.  Pain well-controlled reasonable. No air leak.  Will remove the chest tube, plan for discharge later today.   -History of hypothyroidism: Levothyroxine resumed  -History of BPH- voiding post Foley removal.    LOS: 2 days    Leary Roca, PA-C 03/24/2023

## 2023-03-24 NOTE — Care Management Important Message (Signed)
Important Message  Patient Details  Name: Micheal Baker MRN: [id number] Date of Birth: 1946/04/13   Medicare Important Message Given:  Yes     Renie Ora 03/24/2023, 2:28 PM

## 2023-03-25 ENCOUNTER — Inpatient Hospital Stay (HOSPITAL_COMMUNITY): Payer: Medicare PPO

## 2023-03-25 LAB — BASIC METABOLIC PANEL
Anion gap: 6 (ref 5–15)
BUN: 16 mg/dL (ref 8–23)
CO2: 27 mmol/L (ref 22–32)
Calcium: 8.5 mg/dL — ABNORMAL LOW (ref 8.9–10.3)
Chloride: 102 mmol/L (ref 98–111)
Creatinine, Ser: 1.05 mg/dL (ref 0.61–1.24)
GFR, Estimated: >60 mL/min (ref >60)
Glucose, Bld: 103 mg/dL — ABNORMAL HIGH (ref 70–99)
Potassium: 3.7 mmol/L (ref 3.5–5.1)
Sodium: 135 mmol/L (ref 135–145)

## 2023-03-25 MED ORDER — POTASSIUM CHLORIDE CRYS ER 20 MEQ PO TBCR
40.0000 meq | EXTENDED_RELEASE_TABLET | Freq: Once | ORAL | Status: AC
  Administered 2023-03-25: 40 meq via ORAL
  Filled 2023-03-25: qty 2

## 2023-03-25 MED ORDER — POLYETHYLENE GLYCOL 3350 17 G PO PACK
17.0000 g | PACK | Freq: Every day | ORAL | Status: DC
  Administered 2023-03-25: 17 g via ORAL
  Filled 2023-03-25 (×2): qty 1

## 2023-03-25 MED ORDER — AMIODARONE HCL 200 MG PO TABS
400.0000 mg | ORAL_TABLET | Freq: Two times a day (BID) | ORAL | Status: DC
  Administered 2023-03-25 – 2023-03-26 (×3): 400 mg via ORAL
  Filled 2023-03-25 (×3): qty 2

## 2023-03-25 NOTE — Progress Notes (Addendum)
      [address]       [place] [address]             [phone]       3 Days Post-Op Procedure(s) (LRB): XI ROBOTIC ASSISTED THORACOSCOPY-LEFT LOWER LOBECTOMY (Left) LYMPH NODE DISSECTION (Left)  Subjective: Patient has not had bowel movement in several days and is requesting Miralax.  Objective: Vital signs in last 24 hours: Temp:  [97.7 F (36.5 C)-98.7 F (37.1 C)] 97.7 F (36.5 C) (07/13 0723) Pulse Rate:  [65-124] 67 (07/13 0330) Cardiac Rhythm: Normal sinus rhythm (07/13 0707) Resp:  [14-20] 18 (07/13 0330) BP: (111-147)/(71-96) 128/94 (07/13 0723) SpO2:  [89 %-96 %] 96 % (07/13 0723)     Intake/Output from previous day: 07/12 0701 - 07/13 0700 In: 1161.6 [P.O.:720; I.V.:441.6] Out: 850 [Urine:700; Chest Tube:150]   Physical Exam:  Cardiovascular: RRR Pulmonary: Clear to auscultation on right coarse on left Abdomen: Soft, non tender, bowel sounds present. Extremities: No lower extremity edema. Wounds: Clean and dry.  No erythema or signs of infection.   Lab Results: CBC: Recent Labs    03/23/23 0037 03/24/23 0021  WBC 7.9 5.6  HGB 12.6* 12.4*  HCT 37.5* 36.8*  PLT 200 183   BMET:  Recent Labs    03/24/23 0021 03/25/23 0014  NA 138 135  K 4.3 3.7  CL 99 102  CO2 25 27  GLUCOSE 107* 103*  BUN 16 16  CREATININE 0.99 1.05  CALCIUM 8.7* 8.5*    PT/INR: No results for input(s): "LABPROT", "INR" in the last 72 hours. ABG:  INR: Will add last result for INR, ABG once components are confirmed Will add last 4 CBG results once components are confirmed  Assessment/Plan:  1. CV - He went into a fib with RVR 07/12. On Amiodarone drip. Per nursing note, he converted just before 7 pm last night. SR, first degree heart block this am. Transition to oral Amiodarone later today. 2.  Pulmonary - On room air. CXR this am shows very tiny left apical pneumothorax and some subcutaneous emphysema left lateral chest wall. Encourage  incentive spirometer. Final pathology: TNM Code:pT1a, pN0 (Dr. Cliffton Asters to discuss with patient). 3. If maintains SR, discharge in am 4. Supplement potassium 5. Miralax for constipation, which has already been ordered. He does not want Lactulose as had a "blow out" at another facility  Donielle M ZimmermanPA-C 03/25/2023,9:53 AM  Agree with above Back in sinus Dispo planning  Jailin Manocchio O Melanie Openshaw

## 2023-03-25 NOTE — Plan of Care (Signed)

## 2023-03-26 ENCOUNTER — Inpatient Hospital Stay (HOSPITAL_COMMUNITY): Payer: Medicare PPO

## 2023-03-26 MED ORDER — TRAMADOL HCL 50 MG PO TABS: 50.0000 mg | ORAL_TABLET | Freq: Four times a day (QID) | ORAL | 0 refills | Status: AC | PRN

## 2023-03-26 MED ORDER — AMIODARONE HCL 200 MG PO TABS: 200.0000 mg | ORAL_TABLET | Freq: Every day | ORAL | 0 refills | Status: DC

## 2023-03-26 NOTE — Plan of Care (Signed)
  Problem: Education: Goal: Knowledge of General Education information will improve Description: Including pain rating scale, medication(s)/side effects and non-pharmacologic comfort measures Outcome: Progressing   Problem: Health Behavior/Discharge Planning: Goal: Ability to manage health-related needs will improve Outcome: Progressing   Problem: Clinical Measurements: Goal: Ability to maintain clinical measurements within normal limits will improve Outcome: Progressing Goal: Will remain free from infection Outcome: Progressing Goal: Respiratory complications will improve Outcome: Progressing   Problem: Activity: Goal: Risk for activity intolerance will decrease Outcome: Progressing   Problem: Nutrition: Goal: Adequate nutrition will be maintained Outcome: Progressing   Problem: Coping: Goal: Level of anxiety will decrease Outcome: Progressing   Problem: Pain Managment: Goal: General experience of comfort will improve Outcome: Progressing

## 2023-03-26 NOTE — Progress Notes (Signed)
Went over discharge paper work with patient, answered all questions, gave patient his home medications from pharmacy, removed iv and wheel chaired the patient out to family member vehicle.

## 2023-03-26 NOTE — Progress Notes (Addendum)
      [address]       [place] [address]             [phone]       4 Days Post-Op Procedure(s) (LRB): XI ROBOTIC ASSISTED THORACOSCOPY-LEFT LOWER LOBECTOMY (Left) LYMPH NODE DISSECTION (Left)  Subjective: Patient has had multiple bowel movements. He would like to go home.  Objective: Vital signs in last 24 hours: Temp:  [97.6 F (36.4 C)-98.4 F (36.9 C)] 98.2 F (36.8 C) (07/14 0742) Pulse Rate:  [62-67] 65 (07/14 0523) Cardiac Rhythm: Normal sinus rhythm (07/14 0800) Resp:  [11-16] 11 (07/14 0523) BP: (128-155)/(60-98) 145/93 (07/14 0742) SpO2:  [95 %-97 %] 95 % (07/14 0523)     Intake/Output from previous day: 07/13 0701 - 07/14 0700 In: 360.8 [P.O.:240; I.V.:120.8] Out: 3325 [Urine:3325]   Physical Exam:  Cardiovascular: RRR Pulmonary: Clear to auscultation on right coarse on left Abdomen: Soft, non tender, bowel sounds present. Extremities: No lower extremity edema. Wounds: Clean and dry.  No erythema or signs of infection.   Lab Results: CBC: Recent Labs    03/24/23 0021  WBC 5.6  HGB 12.4*  HCT 36.8*  PLT 183   BMET:  Recent Labs    03/24/23 0021 03/25/23 0014  NA 138 135  K 4.3 3.7  CL 99 102  CO2 25 27  GLUCOSE 107* 103*  BUN 16 16  CREATININE 0.99 1.05  CALCIUM 8.7* 8.5*    PT/INR: No results for input(s): "LABPROT", "INR" in the last 72 hours. ABG:  INR: Will add last result for INR, ABG once components are confirmed Will add last 4 CBG results once components are confirmed  Assessment/Plan:  1. CV - He went into a fib with RVR 07/12. Maintaining SR, first degree heart block this am. On Amiodarone  400 mg bid. Per Dr. Cliffton Asters, no BB (HR 60's) at this time. Also, he is on Lexapro so will discharge on Amiodarone 200 mg daily for one week then stop. 2.  Pulmonary - On room air. CXR this am shows no pneumothorax, left base atelectasis and trace pleural effusion and stable subcutaneous emphysema left lateral  chest wall. Encourage incentive spirometer. Final pathology: TNM Code:pT1a, pN0 (Dr. Cliffton Asters to discuss with patient). 3. Discharge  Nicola Quesnell M ZimmermanPA-C 03/26/2023,8:43 AM

## 2023-03-27 DIAGNOSIS — E039 Hypothyroidism, unspecified: Secondary | ICD-10-CM | POA: Diagnosis not present

## 2023-03-27 LAB — SURGICAL PATHOLOGY

## 2023-03-28 DIAGNOSIS — G4733 Obstructive sleep apnea (adult) (pediatric): Secondary | ICD-10-CM | POA: Diagnosis not present

## 2023-03-29 ENCOUNTER — Ambulatory Visit (INDEPENDENT_AMBULATORY_CARE_PROVIDER_SITE_OTHER): Payer: Self-pay | Admitting: Thoracic Surgery (Cardiothoracic Vascular Surgery)

## 2023-03-29 VITALS — BP 111/69 | HR 70 | Resp 20 | Ht 77.0 in | Wt 213.0 lb

## 2023-03-29 DIAGNOSIS — C3490 Malignant neoplasm of unspecified part of unspecified bronchus or lung: Secondary | ICD-10-CM | POA: Diagnosis not present

## 2023-03-29 DIAGNOSIS — E039 Hypothyroidism, unspecified: Secondary | ICD-10-CM | POA: Diagnosis not present

## 2023-03-29 DIAGNOSIS — Z1331 Encounter for screening for depression: Secondary | ICD-10-CM | POA: Diagnosis not present

## 2023-03-29 DIAGNOSIS — Z09 Encounter for follow-up examination after completed treatment for conditions other than malignant neoplasm: Secondary | ICD-10-CM

## 2023-03-29 DIAGNOSIS — E663 Overweight: Secondary | ICD-10-CM | POA: Diagnosis not present

## 2023-03-29 DIAGNOSIS — Z6825 Body mass index (BMI) 25.0-25.9, adult: Secondary | ICD-10-CM | POA: Diagnosis not present

## 2023-03-29 DIAGNOSIS — F320 Major depressive disorder, single episode, mild: Secondary | ICD-10-CM | POA: Diagnosis not present

## 2023-03-29 DIAGNOSIS — Z0001 Encounter for general adult medical examination with abnormal findings: Secondary | ICD-10-CM | POA: Diagnosis not present

## 2023-03-29 DIAGNOSIS — I10 Essential (primary) hypertension: Secondary | ICD-10-CM | POA: Diagnosis not present

## 2023-03-29 DIAGNOSIS — R911 Solitary pulmonary nodule: Secondary | ICD-10-CM

## 2023-03-30 ENCOUNTER — Other Ambulatory Visit: Payer: Self-pay

## 2023-03-30 ENCOUNTER — Other Ambulatory Visit: Payer: Medicare PPO

## 2023-03-30 ENCOUNTER — Ambulatory Visit: Payer: Medicare PPO | Admitting: Neurology

## 2023-03-30 DIAGNOSIS — R972 Elevated prostate specific antigen [PSA]: Secondary | ICD-10-CM

## 2023-03-30 NOTE — Progress Notes (Signed)
The proposed treatment discussed in conference is for discussion purpose only and is not a binding recommendation.  The patients have not been physically examined, or presented with their treatment options.  Therefore, final treatment plans cannot be decided.  

## 2023-03-30 NOTE — Progress Notes (Unsigned)
[address]       [place] [address]             [phone]        Micheal Baker [facility] Medical Record #[id number] Date of Birth: [birth year]  Referring: Micheal Ngo, NP Primary Care: Micheal Found, MD Primary Cardiologist:Micheal Norton Pastel, MD  Reason for visit:   follow-up  History of Present Illness:     77 year old male presents for his first follow-up appointment.  Overall he is doing well.  Pain is minimal.  Physical Exam: BP 111/69   Pulse 70   Resp 20   Ht 6\' 5"  (1.956 m)   Wt 213 lb (96.6 kg)   SpO2 96% Comment: RA  BMI 25.26 kg/m   Alert NAD Incision clean.  Abdomen, ND No peripheral edema   Diagnostic Studies & Laboratory data:  Path: FINAL MICROSCOPIC DIAGNOSIS:   A. LYMPH NODE, LEVEL 9, EXCISION:       One lymph node, with necrosis and calcifications, negative for  metastatic carcinoma (0/1).   B. LYMPH NODE, HILAR, EXCISION:       One lymph node, with necrosis and calcifications, negative for  metastatic carcinoma (0/1).   C. LYMPH NODE, HILAR #2, EXCISION:       One lymph node, with necrosis and calcifications, negative for  metastatic carcinoma (0/1).   D. LYMPH NODE, HILAR #3, EXCISION:       One lymph node, negative for metastatic carcinoma (0/1).   E. LYMPH NODE, HILAR #4, EXCISION:       One lymph node, negative for metastatic carcinoma (0/1).   F. LYMPH NODE, LEVEL 6, EXCISION:       One lymph node, with necrosis and calcifications, negative for  metastatic carcinoma (0/1).   G. 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       Tumor size: 1.0 x 0.8 x 0.7 cm.       Bronchial margin and vascular margin are negative for carcinoma.       Focal pleural involvement.       Spreading through airway identified.       Background pulmonary parenchyma with foci of necrotic nodules with  calcifications.      One lymph node, with necrosis and  calcifications, negative for  metastatic carcinoma (0/1).       See oncology table.   ONCOLOGY TABLE:   LUNG: Resection   Synchronous Tumors: Not applicable  Total Number of Primary Tumors: 1  Procedure: Lobectomy  Specimen Laterality: Left  Tumor Focality: Unifocal  Tumor Site: Left lower lobe  Tumor Size: 1.0 x 0.8 x 0.7 cm       Total Tumor Size: 1.0 cm in maximal dimension       Invasive Tumor Size (applies only to invasive nonmucinous  adenocarcinoma with a lepidic            component): 1.0 cm in maximal dimension  Histologic Type: Invasive adenocarcinoma  Visceral Pleura Invasion: Focally identified  Direct Invasion of Adjacent Structures: No adjacent structures present  Lymphovascular Invasion: Not identified  Margins: All margins negative for invasive carcinoma       Closest Margin(s) to Invasive Carcinoma: 6.0 cm to the bronchial  margin      Margin(s) Involved by Invasive Carcinoma: None       Margin Status for Non-Invasive Tumor: Not applicable  Treatment Effect: No known presurgical therapy       Percentage of Residual Viable Tumor: NA  Regional Lymph Nodes:       Number of Lymph Nodes Involved: 0                            Nodal Sites with Tumor: NA       Number of Lymph Nodes Examined: 7                       Nodal Sites Examined: 0  Distant Metastasis:       Distant Site(s) Involved: Not applicable  Pathologic Stage Classification (pTNM, AJCC 8th Edition): pT1a, pN0  Ancillary Studies: Can be performed upon request  Representative Tumor Block: G3, G4, G5     Assessment / Plan:   77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  Doing well. Pt will return in 1 month with a CXR.     Micheal Baker Micheal Baker 03/30/2023 3:00 PM

## 2023-03-31 ENCOUNTER — Other Ambulatory Visit: Payer: Medicare PPO

## 2023-03-31 ENCOUNTER — Ambulatory Visit: Payer: Medicare PPO | Admitting: Thoracic Surgery (Cardiothoracic Vascular Surgery)

## 2023-03-31 LAB — PSA: Prostate Specific Ag, Serum: 6.6 ng/mL — ABNORMAL HIGH (ref 0.0–4.0)

## 2023-04-03 ENCOUNTER — Telehealth: Payer: Self-pay | Admitting: Pulmonary Disease

## 2023-04-05 ENCOUNTER — Encounter (INDEPENDENT_AMBULATORY_CARE_PROVIDER_SITE_OTHER): Payer: Medicare PPO | Admitting: Ophthalmology

## 2023-04-05 DIAGNOSIS — Z961 Presence of intraocular lens: Secondary | ICD-10-CM

## 2023-04-05 DIAGNOSIS — Z8669 Personal history of other diseases of the nervous system and sense organs: Secondary | ICD-10-CM

## 2023-04-05 DIAGNOSIS — H25811 Combined forms of age-related cataract, right eye: Secondary | ICD-10-CM

## 2023-04-05 NOTE — Telephone Encounter (Signed)
Spoke to pt and informed him of his pathology report being sent in the mail for him. Pathology report mailed 04/08/2023. Pt verbalized understanding. Nothing further needed.

## 2023-04-05 NOTE — Progress Notes (Shared)
[facility] - Clinic Note  04/18/2023     CHIEF COMPLAINT Patient presents for No chief complaint on file.  HISTORY OF PRESENT ILLNESS: Micheal Baker is a 77 y.o. male who presents to the clinic today for:  No issues reported with vision  Patient states that he had a UTI and sepsis. He has prostate issues. Patient is taking Flomax.  Referring physician: [name], MD [address] [place],  [place] [address]  HISTORICAL INFORMATION:   Selected notes from the MEDICAL RECORD NUMBER     CURRENT MEDICATIONS: Current Outpatient Medications (Ophthalmic Drugs)  Medication Sig   Propylene Glycol (SYSTANE COMPLETE) 0.6 % SOLN Place 1 drop into both eyes as needed (dry eyes).   No current facility-administered medications for this visit. (Ophthalmic Drugs)   Current Outpatient Medications (Other)  Medication Sig   acetaminophen (TYLENOL) 500 MG tablet Take 500-1,000 mg by mouth every 6 (six) hours as needed for mild pain, headache or moderate pain.   ALPHA LIPOIC ACID PO Take 1 capsule by mouth in the morning.   amiodarone (PACERONE) 200 MG tablet Take 1 tablet (200 mg total) by mouth daily for 7 days.   B Complex-C (B-COMPLEX WITH VITAMIN C) tablet Take 1 tablet by mouth in the morning.   buPROPion (WELLBUTRIN XL) 150 MG 24 hr tablet Take 150 mg by mouth daily.   calcium carbonate (TUMS - DOSED IN MG ELEMENTAL CALCIUM) 500 MG chewable tablet Chew 1 tablet (200 mg of elemental calcium total) by mouth 3 (three) times daily with meals. (Patient taking differently: Chew 1 tablet by mouth 3 (three) times daily as needed for indigestion or heartburn.)   diclofenac Sodium (VOLTAREN) 1 % GEL Apply 1 Application topically 4 (four) times daily as needed (knee pain).   doxylamine, Sleep, (UNISOM) 25 MG tablet Take 25 mg by mouth at bedtime as needed for sleep.   escitalopram (LEXAPRO) 20 MG tablet Take 20 mg by mouth daily.   finasteride (PROSCAR) 5 MG tablet Take 1  tablet (5 mg total) by mouth daily.   fluticasone (FLONASE) 50 MCG/ACT nasal spray Place 1 spray into both nostrils daily as needed for allergies.   levothyroxine (SYNTHROID) 88 MCG tablet Take 1 tablet (88 mcg total) by mouth daily before breakfast.   pantoprazole (PROTONIX) 40 MG tablet Take 1 tablet (40 mg total) by mouth daily.   silodosin (RAPAFLO) 8 MG CAPS capsule Take 1 capsule (8 mg total) by mouth 2 (two) times daily.   No current facility-administered medications for this visit. (Other)   REVIEW OF SYSTEMS:   ALLERGIES Allergies  Allergen Reactions   Avelox [Moxifloxacin] Other (See Comments)    Caused tendonitis   Penicillins Other (See Comments)    Chills    PAST MEDICAL HISTORY Past Medical History:  Diagnosis Date   Acute medial meniscus tear of left knee    Depression    Frequent PVCs    GERD (gastroesophageal reflux disease)    History of hiatal hernia    History of inguinal hernia    History of kidney stones    Hypertension    Hypothyroidism    Lung cancer (HCC)    NSVT (nonsustained ventricular tachycardia) (HCC)    Osteoarthritis    Sleep apnea    Thyroid disease    Past Surgical History:  Procedure Laterality Date   BIOPSY  12/01/2022   Procedure: BIOPSY;  Surgeon: Lanelle Bal, DO;  [facility];  Service:  Endoscopy;;   BRONCHIAL BIOPSY  02/07/2023   Procedure: BRONCHIAL BIOPSIES;  Surgeon: Josephine Igo, DO;  [facility];  Service: Pulmonary;;   BRONCHIAL NEEDLE ASPIRATION BIOPSY  02/07/2023   Procedure: BRONCHIAL NEEDLE ASPIRATION BIOPSIES;  Surgeon: Josephine Igo, DO;  [facility];  Service: Pulmonary;;   CATARACT EXTRACTION     left   COLONOSCOPY N/A 04/08/2016   Surgeon: West Bali, MD; one 6 mm polyp in the cecum removed, one 4 mm polyp in the descending colon removed, nonbleeding internal hemorrhoids.  Pathology with hyperplastic polyps.  Recommended colonoscopy in 10 years.   COLONOSCOPY   12/01/2022   COLONOSCOPY WITH PROPOFOL N/A 12/01/2022   Procedure: COLONOSCOPY WITH PROPOFOL;  Surgeon: Lanelle Bal, DO;  [facility];  Service: Endoscopy;  Laterality: N/A;  8:15 am, asa 3   endosocopy  12/01/2022   ESOPHAGOGASTRODUODENOSCOPY (EGD) WITH PROPOFOL N/A 12/01/2022   Procedure: ESOPHAGOGASTRODUODENOSCOPY (EGD) WITH PROPOFOL;  Surgeon: Lanelle Bal, DO;  [facility];  Service: Endoscopy;  Laterality: N/A;   EYE SURGERY     left-scar tissue   FIDUCIAL MARKER PLACEMENT  02/07/2023   Procedure: FIDUCIAL MARKER PLACEMENT;  Surgeon: Josephine Igo, DO;  [facility];  Service: Pulmonary;;   groin surgery Right 2010   growth removed    KNEE ARTHROSCOPY     left   KNEE ARTHROSCOPY WITH LATERAL MENISECTOMY Right 03/11/2015   Procedure: RIGHT KNEE ARTHROSCOPY WITH MEDIALAND LATERAL MENISECTOMY;  Surgeon: Salvatore Marvel, MD;  Location: [facility];  Service: Orthopedics;  Laterality: Right;   KNEE ARTHROSCOPY WITH MEDIAL MENISECTOMY Left 06/27/2014   Procedure: LEFT KNEE ARTHROSCOPY WITH PARTIAL MEDIAL AND LATERAL MENISECTOMIES AND CHONDROPLASTY;  Surgeon: Nilda Simmer, MD;  Location: [facility];  Service: Orthopedics;  Laterality: Left;   KNEE ARTHROSCOPY WITH MEDIAL MENISECTOMY Right 03/11/2015   Procedure: KNEE ARTHROSCOPY WITH MEDIAL MENISECTOMY;  Surgeon: Salvatore Marvel, MD;  Location: [facility];  Service: Orthopedics;  Laterality: Right;   LEFT HEART CATHETERIZATION WITH CORONARY ANGIOGRAM N/A 09/14/2011   Procedure: LEFT HEART CATHETERIZATION WITH CORONARY ANGIOGRAM;  Surgeon: [name], MD;  Location: [facility];  Service: Cardiovascular;  Laterality: N/A;   LYMPH NODE DISSECTION Left 03/22/2023   Procedure: LYMPH NODE DISSECTION;  Surgeon: Corliss Skains, MD;  [facility];  Service: Thoracic;  Laterality: Left;   POLYPECTOMY  04/08/2016   Procedure: POLYPECTOMY;  Surgeon:  West Bali, MD;  [facility];  Service: Endoscopy;;  colon    POLYPECTOMY  12/01/2022   POLYPECTOMY  12/01/2022   Procedure: POLYPECTOMY;  Surgeon: Lanelle Bal, DO;  [facility];  Service: Endoscopy;;   RETINAL DETACHMENT SURGERY Left 2002   Dr. Hermina Barters   FAMILY HISTORY Family History  Problem Relation Age of Onset   Hypertension Mother    Hypertension Father    Heart attack Father    Diabetes Father    Asthma Daughter    Colon cancer Neg Hx    Stomach cancer Neg Hx    SOCIAL HISTORY Social History   Tobacco Use   Smoking status: Never   Smokeless tobacco: Never   Tobacco comments:    06/10/14 1968- Tried it once and didn't like it- AJ  Vaping Use   Vaping status: Never Used  Substance Use Topics   Alcohol use: Yes    Alcohol/week: 0.0 standard drinks of alcohol  Comment: occasionally   Drug use: No       OPHTHALMIC EXAM:  Not recorded     IMAGING AND PROCEDURES  Imaging and Procedures for 04/18/2023          ASSESSMENT/PLAN:    ICD-10-CM   1. History of retinal detachment  Z86.69     2. Combined forms of age-related cataract of right eye  H25.811     3. Pseudophakia  Z96.1       1. Hx of RD OU  - OD focal RD 8-10 oclock s/p laser retinopexy / barricade - early 2000s, Dr. Lily Kocher  - OS s/p RD repair early 2000s, Dr. Lily Kocher  - has been followed by Dr. Constance Goltz  - transferred care here due to proximity and transportation  - RD repairs stable OU  - no new RT/RD OU             - F/u in 1 yr, sooner prn -- DFE/OCT    2. Mixed Cataract OD - The symptoms of cataract, surgical options, and treatments and risks were discussed with patient. - discussed diagnosis and progression - will refer to Dr. [name] for cat eval and management  3. Pseudophakia OS  - s/p CE/IOL OS - unknown Lobbyist at [facility]  - IOL in good position, doing well  - continue to monitor  Ophthalmic Meds Ordered this visit:  No orders of  the defined types were placed in this encounter.     No follow-ups on file.  There are no Patient Instructions on file for this visit.  Explained the diagnoses, plan, and follow up with the patient and they expressed understanding.  Patient expressed understanding of the importance of proper follow up care.  This document serves as a record of services personally performed by Karie Chimera, MD, PhD. It was created on their behalf by Gerilyn Nestle, COT an ophthalmic technician. The creation of this record is the provider's dictation and/or activities during the visit.    Electronically signed by:  Charlette Caffey, COT  04/05/23 7:43 AM   Karie Chimera, M.D., Ph.D. Diseases & Surgery of the Retina and Vitreous [facility]   Abbreviations: M myopia (nearsighted); A astigmatism; H hyperopia (farsighted); P presbyopia; Mrx spectacle prescription;  CTL contact lenses; OD right eye; OS left eye; OU both eyes  XT exotropia; ET esotropia; PEK punctate epithelial keratitis; PEE punctate epithelial erosions; DES dry eye syndrome; MGD meibomian gland dysfunction; ATs artificial tears; PFAT's preservative free artificial tears; NSC nuclear sclerotic cataract; PSC posterior subcapsular cataract; ERM epi-retinal membrane; PVD posterior vitreous detachment; RD retinal detachment; DM diabetes mellitus; DR diabetic retinopathy; NPDR non-proliferative diabetic retinopathy; PDR proliferative diabetic retinopathy; CSME clinically significant macular edema; DME diabetic macular edema; dbh dot blot hemorrhages; CWS cotton wool spot; POAG primary open angle glaucoma; C/D cup-to-disc ratio; HVF humphrey visual field; GVF goldmann visual field; OCT optical coherence tomography; IOP intraocular pressure; BRVO Branch retinal vein occlusion; CRVO central retinal vein occlusion; CRAO central retinal artery occlusion; BRAO branch retinal artery occlusion; RT retinal tear; SB scleral buckle;  PPV pars plana vitrectomy; VH Vitreous hemorrhage; PRP panretinal laser photocoagulation; IVK intravitreal kenalog; VMT vitreomacular traction; MH Macular hole;  NVD neovascularization of the disc; NVE neovascularization elsewhere; AREDS age related eye disease study; ARMD age related macular degeneration; POAG primary open angle glaucoma; EBMD epithelial/anterior basement membrane dystrophy; ACIOL anterior chamber intraocular lens; IOL intraocular lens; PCIOL posterior chamber intraocular lens; Phaco/IOL phacoemulsification with  intraocular lens placement; PRK photorefractive keratectomy; LASIK laser assisted in situ keratomileusis; HTN hypertension; DM diabetes mellitus; COPD chronic obstructive pulmonary disease

## 2023-04-07 ENCOUNTER — Ambulatory Visit: Payer: Medicare PPO | Admitting: Urology

## 2023-04-07 VITALS — BP 129/84 | HR 70

## 2023-04-07 DIAGNOSIS — R339 Retention of urine, unspecified: Secondary | ICD-10-CM | POA: Diagnosis not present

## 2023-04-07 DIAGNOSIS — R972 Elevated prostate specific antigen [PSA]: Secondary | ICD-10-CM

## 2023-04-07 DIAGNOSIS — N401 Enlarged prostate with lower urinary tract symptoms: Secondary | ICD-10-CM

## 2023-04-07 DIAGNOSIS — N138 Other obstructive and reflux uropathy: Secondary | ICD-10-CM | POA: Diagnosis not present

## 2023-04-07 LAB — URINALYSIS, ROUTINE W REFLEX MICROSCOPIC
Bilirubin, UA: NEGATIVE
Glucose, UA: NEGATIVE
Ketones, UA: NEGATIVE
Leukocytes,UA: NEGATIVE
Nitrite, UA: NEGATIVE
Protein,UA: NEGATIVE
RBC, UA: NEGATIVE
Specific Gravity, UA: <1.005 — ABNORMAL LOW (ref 1.005–1.030)
Urobilinogen, Ur: 0.2 mg/dL (ref 0.2–1.0)
pH, UA: 7 (ref 5.0–7.5)

## 2023-04-07 MED ORDER — SILODOSIN 8 MG PO CAPS
8.0000 mg | ORAL_CAPSULE | Freq: Two times a day (BID) | ORAL | 11 refills | Status: DC
Start: 2023-04-07 — End: 2023-10-06

## 2023-04-07 MED ORDER — FINASTERIDE 5 MG PO TABS: 5.0000 mg | ORAL_TABLET | Freq: Every day | ORAL | 3 refills | Status: DC

## 2023-04-07 NOTE — Progress Notes (Signed)
04/07/2023 12:16 PM   Micheal Baker Aug 27, 1946 [id number]  Referring provider: [name], MD [address] [place],  [place] [address]  Followup elevated PSa and BPH   HPI: Mr Rill is a 77yo here for followup for elevated PSA and BPH. PSA increased to 6.6 from 6.3 on finasteride. IPSS 15 QOL 2 on rapaflo 8mg  BID and finasteride. [place] stream is mostly strong. No straining to urinate. Nocturia 2-3x. NO other complaints today   PMH: Past Medical History:  Diagnosis Date   Acute medial meniscus tear of left knee    Depression    Frequent PVCs    GERD (gastroesophageal reflux disease)    History of hiatal hernia    History of inguinal hernia    History of kidney stones    Hypertension    Hypothyroidism    Lung cancer (HCC)    NSVT (nonsustained ventricular tachycardia) (HCC)    Osteoarthritis    Sleep apnea    Thyroid disease     Surgical History: Past Surgical History:  Procedure Laterality Date   BIOPSY  12/01/2022   Procedure: BIOPSY;  Surgeon: Lanelle Bal, DO;  [facility];  Service: Endoscopy;;   BRONCHIAL BIOPSY  02/07/2023   Procedure: BRONCHIAL BIOPSIES;  Surgeon: Josephine Igo, DO;  [facility];  Service: Pulmonary;;   BRONCHIAL NEEDLE ASPIRATION BIOPSY  02/07/2023   Procedure: BRONCHIAL NEEDLE ASPIRATION BIOPSIES;  Surgeon: Josephine Igo, DO;  [facility];  Service: Pulmonary;;   CATARACT EXTRACTION     left   COLONOSCOPY N/A 04/08/2016   Surgeon: West Bali, MD; one 6 mm polyp in the cecum removed, one 4 mm polyp in the descending colon removed, nonbleeding internal hemorrhoids.  Pathology with hyperplastic polyps.  Recommended colonoscopy in 10 years.   COLONOSCOPY  12/01/2022   COLONOSCOPY WITH PROPOFOL N/A 12/01/2022   Procedure: COLONOSCOPY WITH PROPOFOL;  Surgeon: Lanelle Bal, DO;  [facility];  Service: Endoscopy;  Laterality: N/A;  8:15 am, asa 3   endosocopy  12/01/2022    ESOPHAGOGASTRODUODENOSCOPY (EGD) WITH PROPOFOL N/A 12/01/2022   Procedure: ESOPHAGOGASTRODUODENOSCOPY (EGD) WITH PROPOFOL;  Surgeon: Lanelle Bal, DO;  [facility];  Service: Endoscopy;  Laterality: N/A;   EYE SURGERY     left-scar tissue   FIDUCIAL MARKER PLACEMENT  02/07/2023   Procedure: FIDUCIAL MARKER PLACEMENT;  Surgeon: Josephine Igo, DO;  [facility];  Service: Pulmonary;;   groin surgery Right 2010   growth removed    KNEE ARTHROSCOPY     left   KNEE ARTHROSCOPY WITH LATERAL MENISECTOMY Right 03/11/2015   Procedure: RIGHT KNEE ARTHROSCOPY WITH MEDIALAND LATERAL MENISECTOMY;  Surgeon: Salvatore Marvel, MD;  Location: [facility];  Service: Orthopedics;  Laterality: Right;   KNEE ARTHROSCOPY WITH MEDIAL MENISECTOMY Left 06/27/2014   Procedure: LEFT KNEE ARTHROSCOPY WITH PARTIAL MEDIAL AND LATERAL MENISECTOMIES AND CHONDROPLASTY;  Surgeon: Nilda Simmer, MD;  Location: [facility];  Service: Orthopedics;  Laterality: Left;   KNEE ARTHROSCOPY WITH MEDIAL MENISECTOMY Right 03/11/2015   Procedure: KNEE ARTHROSCOPY WITH MEDIAL MENISECTOMY;  Surgeon: Salvatore Marvel, MD;  Location: [facility];  Service: Orthopedics;  Laterality: Right;   LEFT HEART CATHETERIZATION WITH CORONARY ANGIOGRAM N/A 09/14/2011   Procedure: LEFT HEART CATHETERIZATION WITH CORONARY ANGIOGRAM;  Surgeon: [name], MD;  Location: [facility];  Service: Cardiovascular;  Laterality: N/A;   LYMPH NODE DISSECTION Left 03/22/2023  Procedure: LYMPH NODE DISSECTION;  Surgeon: Corliss Skains, MD;  Location: [facility];  Service: Thoracic;  Laterality: Left;   POLYPECTOMY  04/08/2016   Procedure: POLYPECTOMY;  Surgeon: West Bali, MD;  [facility];  Service: Endoscopy;;  colon    POLYPECTOMY  12/01/2022   POLYPECTOMY  12/01/2022   Procedure: POLYPECTOMY;  Surgeon: Lanelle Bal, DO;  [facility];  Service: Endoscopy;;    RETINAL DETACHMENT SURGERY Left 2002   Dr. Hermina Barters    Home Medications:  Allergies as of 04/07/2023       Reactions   Avelox [moxifloxacin] Other (See Comments)   Caused tendonitis   Penicillins Other (See Comments)   Chills        Medication List        Accurate as of April 07, 2023 12:16 PM. If you have any questions, ask your nurse or doctor.          acetaminophen 500 MG tablet Commonly known as: TYLENOL Take 500-1,000 mg by mouth every 6 (six) hours as needed for mild pain, headache or moderate pain.   ALPHA LIPOIC ACID PO Take 1 capsule by mouth in the morning.   amiodarone 200 MG tablet Commonly known as: PACERONE Take 1 tablet (200 mg total) by mouth daily for 7 days.   B-complex with vitamin C tablet Take 1 tablet by mouth in the morning.   buPROPion 150 MG 24 hr tablet Commonly known as: WELLBUTRIN XL Take 150 mg by mouth daily.   calcium carbonate 500 MG chewable tablet Commonly known as: TUMS - dosed in mg elemental calcium Chew 1 tablet (200 mg of elemental calcium total) by mouth 3 (three) times daily with meals. What changed:  when to take this reasons to take this   doxylamine (Sleep) 25 MG tablet Commonly known as: UNISOM Take 25 mg by mouth at bedtime as needed for sleep.   escitalopram 20 MG tablet Commonly known as: LEXAPRO Take 20 mg by mouth daily.   finasteride 5 MG tablet Commonly known as: PROSCAR Take 1 tablet (5 mg total) by mouth daily.   fluticasone 50 MCG/ACT nasal spray Commonly known as: FLONASE Place 1 spray into both nostrils daily as needed for allergies.   levothyroxine 88 MCG tablet Commonly known as: Synthroid Take 1 tablet (88 mcg total) by mouth daily before breakfast.   pantoprazole 40 MG tablet Commonly known as: PROTONIX Take 1 tablet (40 mg total) by mouth daily.   silodosin 8 MG Caps capsule Commonly known as: RAPAFLO Take 1 capsule (8 mg total) by mouth 2 (two) times daily.   Systane Complete  0.6 % Soln Generic drug: Propylene Glycol Place 1 drop into both eyes as needed (dry eyes).   Voltaren 1 % Gel Generic drug: diclofenac Sodium Apply 1 Application topically 4 (four) times daily as needed (knee pain).        Allergies:  Allergies  Allergen Reactions   Avelox [Moxifloxacin] Other (See Comments)    Caused tendonitis   Penicillins Other (See Comments)    Chills     Family History: Family History  Problem Relation Age of Onset   Hypertension Mother    Hypertension Father    Heart attack Father    Diabetes Father    Asthma Daughter    Colon cancer Neg Hx    Stomach cancer Neg Hx     Social History:  reports that he has never smoked. He has never used smokeless  tobacco. He reports current alcohol use. He reports that he does not use drugs.  ROS: All other review of systems were reviewed and are negative except what is noted above in HPI  Physical Exam: BP 129/84   Pulse 70   Constitutional:  Alert and oriented, No acute distress. HEENT: [place] AT, moist mucus membranes.  Trachea midline, no masses. Cardiovascular: No clubbing, cyanosis, or edema. Respiratory: Normal respiratory effort, no increased work of breathing. GI: Abdomen is soft, nontender, nondistended, no abdominal masses GU: No CVA tenderness.  Lymph: No cervical or inguinal lymphadenopathy. Skin: No rashes, bruises or suspicious lesions. Neurologic: Grossly intact, no focal deficits, moving all 4 extremities. Psychiatric: Normal mood and affect.  Laboratory Data: Lab Results  Component Value Date   WBC 5.6 03/24/2023   HGB 12.4 (L) 03/24/2023   HCT 36.8 (L) 03/24/2023   MCV 87.0 03/24/2023   PLT 183 03/24/2023    Lab Results  Component Value Date   CREATININE 1.05 03/25/2023    No results found for: "PSA"  No results found for: "TESTOSTERONE"  No results found for: "HGBA1C"  Urinalysis    Component Value Date/Time   COLORURINE YELLOW 03/20/2023 1337   APPEARANCEUR CLEAR  03/20/2023 1337   APPEARANCEUR Clear 02/10/2023 1134   LABSPEC 1.006 03/20/2023 1337   PHURINE 6.0 03/20/2023 1337   GLUCOSEU NEGATIVE 03/20/2023 1337   HGBUR NEGATIVE 03/20/2023 1337   BILIRUBINUR NEGATIVE 03/20/2023 1337   BILIRUBINUR Negative 02/10/2023 1134   KETONESUR NEGATIVE 03/20/2023 1337   PROTEINUR NEGATIVE 03/20/2023 1337   UROBILINOGEN 0.2 08/08/2011 0800   NITRITE NEGATIVE 03/20/2023 1337   LEUKOCYTESUR NEGATIVE 03/20/2023 1337    Lab Results  Component Value Date   LABMICR See below: 02/10/2023   WBCUA 0-5 02/10/2023   LABEPIT 0-10 02/10/2023   MUCUS Present 02/15/2022   BACTERIA None seen 02/10/2023    Pertinent Imaging:  No results found for this or any previous visit.  No results found for this or any previous visit.  No results found for this or any previous visit.  No results found for this or any previous visit.  No results found for this or any previous visit.  No valid procedures specified. No results found for this or any previous visit.  Results for orders placed during the hospital encounter of 01/30/22  CT Renal Stone Study  Narrative CLINICAL DATA:  Flank pain.  EXAM: CT ABDOMEN AND PELVIS WITHOUT CONTRAST  TECHNIQUE: Multidetector CT imaging of the abdomen and pelvis was performed following the standard protocol without IV contrast.  RADIATION DOSE REDUCTION: This exam was performed according to the departmental dose-optimization program which includes automated exposure control, adjustment of the mA and/or kV according to patient size and/or use of iterative reconstruction technique.  COMPARISON:  CT abdomen and pelvis 01/18/2007  FINDINGS: Lower chest: There is a new 6 mm nodule in the left lung base. Lung bases are otherwise clear. There are calcified granulomas in the lung bases.  Hepatobiliary: Calcified granulomas are present. There is a 3 cm cyst in the inferior right lobe of the liver which has slightly increased  in size compared to the prior study. Gallbladder and bile ducts are within normal limits.  Pancreas: Unremarkable. No pancreatic ductal dilatation or surrounding inflammatory changes.  Spleen: Calcified granulomas are present. Otherwise within normal limits.  Adrenals/Urinary Tract: Foley catheter is seen within the bladder. No urinary tract calculus or hydronephrosis. Adrenal glands within normal limits.  Stomach/Bowel: Stomach is within normal limits. Appendix  appears normal. No evidence of bowel wall thickening, distention, or inflammatory changes. Appendix is not seen. Small hiatal hernia and duodenal diverticulum again noted.  Vascular/Lymphatic: Aortic atherosclerosis. No enlarged abdominal or pelvic lymph nodes.  Reproductive: Prostate gland is enlarged and lobulated with nodular protrusion into the bladder base. Prostate measures 7.0 x 7.3 by 5.9 cm and has significantly increased in size.  Other: No ascites.  Small fat containing inguinal hernias.  Musculoskeletal: No acute or significant osseous findings.  IMPRESSION: 1. No evidence for urinary tract calculus or hydronephrosis. 2. Foley catheter in the bladder. 3. Marked enlargement of the prostate gland with nodular protrusion into the bladder base, new from 2008. Recommend clinical correlation and follow-up to exclude neoplasm. 4. Hepatic cyst. 5. Old granulomatous disease. 6. 6 mm left solid pulmonary nodule. Recommend a non-contrast Chest CT at 6-12 months. If patient is high risk for malignancy, recommend an additional non-contrast Chest CT at 18-24 months; if patient is low risk for malignancy a non-contrast Chest CT at 18-24 months is optional. These guidelines do not apply to immunocompromised patients and patients with cancer. Follow up in patients with significant comorbidities as clinically warranted. For lung cancer screening, adhere to Lung-RADS guidelines. Reference: Radiology.  2017; 284(1):228-43.   Electronically Signed By: Darliss Cheney M.D. On: 01/30/2022 16:15   Assessment & Plan:    1. Elevated PSA -followup 6 months with PSA - Urinalysis, Routine w reflex microscopic  2. Benign prostatic hyperplasia with urinary obstruction Continue rapalfo 8mg  BID and finasteride  3. Urinary retention -continue rapalfo 8mg  BID and finasteride   No follow-ups on file.  Micheal Aye, MD  [facility] [place]

## 2023-04-11 ENCOUNTER — Encounter: Payer: Self-pay | Admitting: Urology

## 2023-04-11 NOTE — Patient Instructions (Signed)

## 2023-04-18 ENCOUNTER — Encounter (INDEPENDENT_AMBULATORY_CARE_PROVIDER_SITE_OTHER): Payer: Medicare PPO | Admitting: Ophthalmology

## 2023-04-18 DIAGNOSIS — Z961 Presence of intraocular lens: Secondary | ICD-10-CM

## 2023-04-18 DIAGNOSIS — Z8669 Personal history of other diseases of the nervous system and sense organs: Secondary | ICD-10-CM

## 2023-04-18 DIAGNOSIS — H25811 Combined forms of age-related cataract, right eye: Secondary | ICD-10-CM

## 2023-04-19 ENCOUNTER — Encounter (HOSPITAL_BASED_OUTPATIENT_CLINIC_OR_DEPARTMENT_OTHER): Payer: Medicare PPO

## 2023-04-25 ENCOUNTER — Ambulatory Visit: Payer: Self-pay | Admitting: Licensed Clinical Social Worker

## 2023-04-25 ENCOUNTER — Inpatient Hospital Stay: Payer: Medicare PPO | Attending: Internal Medicine | Admitting: Internal Medicine

## 2023-04-25 ENCOUNTER — Other Ambulatory Visit: Payer: Self-pay | Admitting: Medical Oncology

## 2023-04-25 ENCOUNTER — Inpatient Hospital Stay: Payer: Medicare PPO

## 2023-04-25 ENCOUNTER — Other Ambulatory Visit: Payer: Self-pay | Admitting: Thoracic Surgery (Cardiothoracic Vascular Surgery)

## 2023-04-25 VITALS — BP 140/83 | HR 64 | Temp 97.8°F | Resp 17 | Ht 77.0 in | Wt 211.6 lb

## 2023-04-25 DIAGNOSIS — C3432 Malignant neoplasm of lower lobe, left bronchus or lung: Secondary | ICD-10-CM | POA: Insufficient documentation

## 2023-04-25 DIAGNOSIS — C3490 Malignant neoplasm of unspecified part of unspecified bronchus or lung: Secondary | ICD-10-CM

## 2023-04-25 DIAGNOSIS — R911 Solitary pulmonary nodule: Secondary | ICD-10-CM

## 2023-04-25 LAB — CMP (CANCER CENTER ONLY)
ALT: 22 U/L (ref 0–44)
AST: 23 U/L (ref 15–41)
Albumin: 4.1 g/dL (ref 3.5–5.0)
Alkaline Phosphatase: 63 U/L (ref 38–126)
Anion gap: 7 (ref 5–15)
BUN: 21 mg/dL (ref 8–23)
CO2: 27 mmol/L (ref 22–32)
Calcium: 9.3 mg/dL (ref 8.9–10.3)
Chloride: 103 mmol/L (ref 98–111)
Creatinine: 0.9 mg/dL (ref 0.61–1.24)
GFR, Estimated: >60 mL/min (ref >60)
Glucose, Bld: 101 mg/dL — ABNORMAL HIGH (ref 70–99)
Potassium: 4.1 mmol/L (ref 3.5–5.1)
Sodium: 137 mmol/L (ref 135–145)
Total Bilirubin: 0.7 mg/dL (ref 0.3–1.2)
Total Protein: 6.8 g/dL (ref 6.5–8.1)

## 2023-04-25 LAB — CBC WITH DIFFERENTIAL (CANCER CENTER ONLY)
Abs Immature Granulocytes: 0.02 10*3/uL (ref 0.00–0.07)
Basophils Absolute: 0 10*3/uL (ref 0.0–0.1)
Basophils Relative: 0 %
Eosinophils Absolute: 0.3 10*3/uL (ref 0.0–0.5)
Eosinophils Relative: 4 %
HCT: 40.8 % (ref 39.0–52.0)
Hemoglobin: 14.5 g/dL (ref 13.0–17.0)
Immature Granulocytes: 0 %
Lymphocytes Relative: 12 %
Lymphs Abs: 0.8 10*3/uL (ref 0.7–4.0)
MCH: 30.2 pg (ref 26.0–34.0)
MCHC: 35.5 g/dL (ref 30.0–36.0)
MCV: 85 fL (ref 80.0–100.0)
Monocytes Absolute: 0.4 10*3/uL (ref 0.1–1.0)
Monocytes Relative: 6 %
Neutro Abs: 5.2 10*3/uL (ref 1.7–7.7)
Neutrophils Relative %: 78 %
Platelet Count: 198 10*3/uL (ref 150–400)
RBC: 4.8 MIL/uL (ref 4.22–5.81)
RDW: 12.8 % (ref 11.5–15.5)
WBC Count: 6.7 10*3/uL (ref 4.0–10.5)
nRBC: 0 % (ref 0.0–0.2)

## 2023-04-25 NOTE — Patient Outreach (Signed)
  Care Coordination   Follow Up Visit Note   04/25/2023 Name: Micheal Baker MRN: [id number] DOB: 1945/10/07  Micheal Baker is a 77 y.o. year old male who sees [name], MD for primary care. I spoke with  Micheal Baker by phone today.  What matters to the patients health and wellness today? Patient has decreased energy    Goals Addressed             This Visit's Progress    Patient Stated he has decreased energy.       Interventions:  Spoke with client via phone today about his current status Client said he had appointment today with Oncologist.   Discussed client treatment plans. Client feels positive about treatment plans  Discussed program support with RN, LCSW, Pharmacist Provided counseling support for client Discussed family support. Client has support of his children.  Discussed sleeping issues of client. Discussed medication procurement Discussed client ambulation. He likes to walk for exercise. Reviewed mood of client. He takes Lexapro as prescribed. Client was in good mood. He did not mention any mood problems today. Thanked client for phone call with LCSW Encouraged Micheal Baker to call LCSW as needed for SW support at [phone].          SDOH assessments and interventions completed:  Yes  SDOH Interventions Today    Flowsheet Row Most Recent Value  SDOH Interventions   Depression Interventions/Treatment  Medication, Counseling  Physical Activity Interventions Other (Comments)  [some mobility challenges]  Stress Interventions Provide Counseling  [has stress in managing medical needs]        Care Coordination Interventions:  Yes, provided   Interventions Today    Flowsheet Row Most Recent Value  Chronic Disease   Chronic disease during today's visit Other  [spoke with clinet about client needs]  General Interventions   General Interventions Discussed/Reviewed General Interventions Discussed, [employer]  [discussed program support]  Exercise  Interventions   Exercise Discussed/Reviewed Physical Activity  [likes to walk occasionally for exercise]  Education Interventions   Education Provided Provided Education  Provided Verbal Education On [employer]  [discussed client appointment with Oncologist on 04/25/23]  Mental Health Interventions   Mental Health Discussed/Reviewed Coping Strategies, Anxiety  [client did not mention any mood issues]  Nutrition Interventions   Nutrition Discussed/Reviewed Nutrition Discussed  Pharmacy Interventions   Pharmacy Dicussed/Reviewed Pharmacy Topics Discussed        Follow up plan: Follow up call scheduled for 06/13/23 at 1:00 PM     Encounter Outcome:  Pt. Visit Completed   Kelton Pillar. MSW, LCSW Licensed Visual merchandiser [facility] Care Management [phone]

## 2023-04-25 NOTE — Patient Instructions (Signed)
Visit Information  Thank you for taking time to visit with me today. Please don't hesitate to contact me if I can be of assistance to you.   Following are the goals we discussed today:   Goals Addressed             This Visit's Progress    Patient Stated he has decreased energy.       Interventions:  Spoke with client via phone today about his current status Client said he had appointment today with Oncologist.   Discussed client treatment plans. Client feels positive about treatment plans  Discussed program support with RN, LCSW, Pharmacist Provided counseling support for client Discussed family support. Client has support of his children.  Discussed sleeping issues of client. Discussed medication procurement Discussed client ambulation. He likes to walk for exercise. Reviewed mood of client. He takes Lexapro as prescribed. Client was in good mood. He did not mention any mood problems today. Thanked client for phone call with LCSW Encouraged Micheal Baker to call LCSW as needed for SW support at [phone].          Our next appointment is by telephone on 06/13/23 at 1:00 PM   Please call the care guide team at [phone] if you need to cancel or reschedule your appointment.   If you are experiencing a Mental Health or Behavioral Health Crisis or need someone to talk to, please go to [facility] [address], [place] [phone])   The patient verbalized understanding of instructions, educational materials, and care plan provided today and DECLINED offer to receive copy of patient instructions, educational materials, and care plan.   The patient has been provided with contact information for the care management team and has been advised to call with any health related questions or concerns.   Kelton Pillar. MSW, LCSW Licensed Visual merchandiser [facility] Care Management [phone]

## 2023-04-25 NOTE — Progress Notes (Signed)
[facility] [phone]   [phone]  CONSULT NOTE  REFERRING PHYSICIAN: Dr. Brynda Greathouse  REASON FOR CONSULTATION:  77 years old white male recently diagnosed with lung cancer.  HPI Micheal Baker is a 77 y.o. male with past medical history significant for hypertension, hypothyroidism, GERD, depression, sleep apnea and osteoarthritis.  The patient is a never smoker.  He mentioned that in May 2023 he was admitted to the hospital with abdominal pain and suspicious urosepsis.  During his evaluation he had CT renal on 01/30/2022 and incidentally it showed a 0.6 cm left solid pulmonary nodule.  A follow-up evaluation in 1 year was recommended and the patient had repeat CT super D of the chest on 01/11/2023 and it showed 0.9 x 0.8 cm irregular noncalcified nodule in the posterior left lower lobe progressive for suspected pleural involvement suspicious for early primary bronchogenic neoplasm.  A PET scan on 02/02/2023 showed the enlarging left lower lobe pulmonary nodule is mildly hypermetabolic for size and suspicious for bronchogenic carcinoma.  There was no evidence of metastatic disease.  He was seen by Dr. Tonia Brooms and on Feb 07, 2023 he had video bronchoscopy with robotic assisted bronchoscopic navigation and biopsy of the left lung nodule.  The final cytology was consistent with non-small cell carcinoma.  MRI of the brain on 02/08/2023 showed no evidence of metastatic disease to the brain.  The patient was referred to Dr. Cliffton Asters and on March 22, 2023 he underwent robotic assisted left video thoracoscopy with left lower lobectomy and mediastinal lymph node sampling.  The final pathology [id number]) showed invasive adenocarcinoma, acinar 80% and solid 20% pattern with tumor size of 1.0 x 0.8 x 0.7 cm.  There was no evidence for lymphovascular invasion but the visceral pleura showed focally identified invasion.  The dissected lymph nodes were negative for  malignancy. The patient was referred to me today for evaluation and recommendation regarding his condition. When seen today he is feeling fine except for the fatigue and occasional hypotension.  He also has left-sided chest discomfort and shortness of breath with exertion with mild cough and no hemoptysis.  He intentionally lost few pounds recently.  He has no nausea, vomiting, diarrhea or constipation.  He has no headache or visual changes. Family history significant for mother with a stroke and father with heart disease. The patient is single and has 2 children a son and daughter.  He used to work as a Engineer, site and currently retired.  He has no history for smoking but used to drink beer at least twice a week not recently. No history of drug abuse.  HPI  Past Medical History:  Diagnosis Date   Acute medial meniscus tear of left knee    Depression    Frequent PVCs    GERD (gastroesophageal reflux disease)    History of hiatal hernia    History of inguinal hernia    History of kidney stones    Hypertension    Hypothyroidism    Lung cancer (HCC)    NSVT (nonsustained ventricular tachycardia) (HCC)    Osteoarthritis    Sleep apnea    Thyroid disease     Past Surgical History:  Procedure Laterality Date   BIOPSY  12/01/2022   Procedure: BIOPSY;  Surgeon: Lanelle Bal, DO;  [facility];  Service: Endoscopy;;   BRONCHIAL BIOPSY  02/07/2023   Procedure: BRONCHIAL BIOPSIES;  Surgeon: Josephine Igo, DO;  [facility]: [facility];  Service: Pulmonary;;   BRONCHIAL NEEDLE ASPIRATION BIOPSY  02/07/2023   Procedure: BRONCHIAL NEEDLE ASPIRATION BIOPSIES;  Surgeon: Josephine Igo, DO;  Location: [facility];  Service: Pulmonary;;   CATARACT EXTRACTION     left   COLONOSCOPY N/A 04/08/2016   Surgeon: West Bali, MD; one 6 mm polyp in the cecum removed, one 4 mm polyp in the descending colon removed, nonbleeding internal hemorrhoids.  Pathology with hyperplastic  polyps.  Recommended colonoscopy in 10 years.   COLONOSCOPY  12/01/2022   COLONOSCOPY WITH PROPOFOL N/A 12/01/2022   Procedure: COLONOSCOPY WITH PROPOFOL;  Surgeon: Lanelle Bal, DO;  [facility];  Service: Endoscopy;  Laterality: N/A;  8:15 am, asa 3   endosocopy  12/01/2022   ESOPHAGOGASTRODUODENOSCOPY (EGD) WITH PROPOFOL N/A 12/01/2022   Procedure: ESOPHAGOGASTRODUODENOSCOPY (EGD) WITH PROPOFOL;  Surgeon: Lanelle Bal, DO;  [facility];  Service: Endoscopy;  Laterality: N/A;   EYE SURGERY     left-scar tissue   FIDUCIAL MARKER PLACEMENT  02/07/2023   Procedure: FIDUCIAL MARKER PLACEMENT;  Surgeon: Josephine Igo, DO;  [facility]: [facility];  Service: Pulmonary;;   groin surgery Right 2010   growth removed    KNEE ARTHROSCOPY     left   KNEE ARTHROSCOPY WITH LATERAL MENISECTOMY Right 03/11/2015   Procedure: RIGHT KNEE ARTHROSCOPY WITH MEDIALAND LATERAL MENISECTOMY;  Surgeon: Salvatore Marvel, MD;  Location: [facility];  Service: Orthopedics;  Laterality: Right;   KNEE ARTHROSCOPY WITH MEDIAL MENISECTOMY Left 06/27/2014   Procedure: LEFT KNEE ARTHROSCOPY WITH PARTIAL MEDIAL AND LATERAL MENISECTOMIES AND CHONDROPLASTY;  Surgeon: Nilda Simmer, MD;  Location: [facility];  Service: Orthopedics;  Laterality: Left;   KNEE ARTHROSCOPY WITH MEDIAL MENISECTOMY Right 03/11/2015   Procedure: KNEE ARTHROSCOPY WITH MEDIAL MENISECTOMY;  Surgeon: Salvatore Marvel, MD;  Location: [facility];  Service: Orthopedics;  Laterality: Right;   LEFT HEART CATHETERIZATION WITH CORONARY ANGIOGRAM N/A 09/14/2011   Procedure: LEFT HEART CATHETERIZATION WITH CORONARY ANGIOGRAM;  Surgeon: [name], MD;  Location: [facility];  Service: Cardiovascular;  Laterality: N/A;   LYMPH NODE DISSECTION Left 03/22/2023   Procedure: LYMPH NODE DISSECTION;  Surgeon: Corliss Skains, MD;  Location: [facility];  Service: Thoracic;  Laterality: Left;    POLYPECTOMY  04/08/2016   Procedure: POLYPECTOMY;  Surgeon: West Bali, MD;  [facility];  Service: Endoscopy;;  colon    POLYPECTOMY  12/01/2022   POLYPECTOMY  12/01/2022   Procedure: POLYPECTOMY;  Surgeon: Lanelle Bal, DO;  [facility];  Service: Endoscopy;;   RETINAL DETACHMENT SURGERY Left 2002   Dr. Hermina Barters    Family History  Problem Relation Age of Onset   Hypertension Mother    Hypertension Father    Heart attack Father    Diabetes Father    Asthma Daughter    Colon cancer Neg Hx    Stomach cancer Neg Hx     Social History Social History   Tobacco Use   Smoking status: Never   Smokeless tobacco: Never   Tobacco comments:    06/10/14 1968- Tried it once and didn't like it- AJ  Vaping Use   Vaping status: Never Used  Substance Use Topics   Alcohol use: Yes    Alcohol/week: 0.0 standard drinks of alcohol    Comment: occasionally   Drug use: No    Allergies  Allergen Reactions   Avelox [Moxifloxacin] Other (See Comments)  Caused tendonitis   Penicillins Other (See Comments)    Chills     Current Outpatient Medications  Medication Sig Dispense Refill   acetaminophen (TYLENOL) 500 MG tablet Take 500-1,000 mg by mouth every 6 (six) hours as needed for mild pain, headache or moderate pain.     ALPHA LIPOIC ACID PO Take 1 capsule by mouth in the morning.     amiodarone (PACERONE) 200 MG tablet Take 1 tablet (200 mg total) by mouth daily for 7 days. 7 tablet 0   B Complex-C (B-COMPLEX WITH VITAMIN C) tablet Take 1 tablet by mouth in the morning.     buPROPion (WELLBUTRIN XL) 150 MG 24 hr tablet Take 150 mg by mouth daily.     calcium carbonate (TUMS - DOSED IN MG ELEMENTAL CALCIUM) 500 MG chewable tablet Chew 1 tablet (200 mg of elemental calcium total) by mouth 3 (three) times daily with meals. (Patient taking differently: Chew 1 tablet by mouth 3 (three) times daily as needed for indigestion or heartburn.) 90 tablet 4    diclofenac Sodium (VOLTAREN) 1 % GEL Apply 1 Application topically 4 (four) times daily as needed (knee pain).     doxylamine, Sleep, (UNISOM) 25 MG tablet Take 25 mg by mouth at bedtime as needed for sleep.     escitalopram (LEXAPRO) 20 MG tablet Take 20 mg by mouth daily.     finasteride (PROSCAR) 5 MG tablet Take 1 tablet (5 mg total) by mouth daily. 90 tablet 3   fluticasone (FLONASE) 50 MCG/ACT nasal spray Place 1 spray into both nostrils daily as needed for allergies.     levothyroxine (SYNTHROID) 88 MCG tablet Take 1 tablet (88 mcg total) by mouth daily before breakfast. 30 tablet 2   pantoprazole (PROTONIX) 40 MG tablet Take 1 tablet (40 mg total) by mouth daily. 90 tablet 3   Propylene Glycol (SYSTANE COMPLETE) 0.6 % SOLN Place 1 drop into both eyes as needed (dry eyes).     silodosin (RAPAFLO) 8 MG CAPS capsule Take 1 capsule (8 mg total) by mouth 2 (two) times daily. 60 capsule 11   No current facility-administered medications for this visit.    Review of Systems  Constitutional: positive for fatigue and weight loss Eyes: negative Ears, nose, mouth, throat, and face: negative Respiratory: positive for cough, dyspnea on exertion, and pleurisy/chest pain Cardiovascular: negative Gastrointestinal: negative Genitourinary:negative Integument/breast: negative Hematologic/lymphatic: negative Musculoskeletal:negative Neurological: negative Behavioral/Psych: negative Endocrine: negative Allergic/Immunologic: negative  Physical Exam  QVZ:DGLOV, healthy, no distress, well nourished, and well developed SKIN: skin color, texture, turgor are normal, no rashes or significant lesions HEAD: Normocephalic, No masses, lesions, tenderness or abnormalities EYES: normal, PERRLA, Conjunctiva are pink and non-injected EARS: External ears normal, Canals clear OROPHARYNX:no exudate, no erythema, and lips, buccal mucosa, and tongue normal  NECK: supple, no adenopathy, no JVD LYMPH:  no  palpable lymphadenopathy, no hepatosplenomegaly BREAST:not examined LUNGS: clear to auscultation , and palpation HEART: regular rate & rhythm, no murmurs, and no gallops ABDOMEN:abdomen soft, non-tender, normal bowel sounds, and no masses or organomegaly BACK: Back symmetric, no curvature., No CVA tenderness EXTREMITIES:no joint deformities, effusion, or inflammation, no edema  NEURO: alert & oriented x 3 with fluent speech, no focal motor/sensory deficits  PERFORMANCE STATUS: ECOG 1  LABORATORY DATA: Lab Results  Component Value Date   WBC 6.7 04/25/2023   HGB 14.5 04/25/2023   HCT 40.8 04/25/2023   MCV 85.0 04/25/2023   PLT 198 04/25/2023      Chemistry  Component Value Date/Time   NA 137 04/25/2023 1335   NA 140 01/09/2023 0928   K 4.1 04/25/2023 1335   CL 103 04/25/2023 1335   CO2 27 04/25/2023 1335   BUN 21 04/25/2023 1335   BUN 14 01/09/2023 0928   CREATININE 0.90 04/25/2023 1335   CREATININE 1.04 01/30/2015 1407      Component Value Date/Time   CALCIUM 9.3 04/25/2023 1335   ALKPHOS 63 04/25/2023 1335   AST 23 04/25/2023 1335   ALT 22 04/25/2023 1335   BILITOT 0.7 04/25/2023 1335       RADIOGRAPHIC STUDIES: No results found.  ASSESSMENT: This is a very pleasant 77 years old white male diagnosed with stage IA (T1a, N0, M0) non-small cell lung cancer, adenocarcinoma presented with left lower lobe lung nodule status post left lower lobectomy with lymph node sampling under the care of Dr. Cliffton Asters on March 22, 2023 with tumor size of 1.0 cm with focal visceral pleural invasion.   PLAN: I had a lengthy discussion with the patient today about his current disease stage, prognosis and treatment options.  I personally and independently reviewed his previous imaging studies as well as the pathology report. I explained to the patient that he had a curable treatment with the surgical resection. I also explained to the patient that there is no survival benefit for  adjuvant chemotherapy, immunotherapy, targeted therapy or radiation for patient with a stage Ia non-small cell lung cancer and the current standard of care is observation. I recommended for the patient to continue on observation with repeat CT scan of the chest in 6 months. He was advised to call immediately if he has any other concerning symptoms in the interval. The patient voices understanding of current disease status and treatment options and is in agreement with the current care plan.  All questions were answered. The patient knows to call the clinic with any problems, questions or concerns. We can certainly see the patient much sooner if necessary.  Thank you so much for allowing me to participate in the care of Micheal Baker. I will continue to follow up the patient with you and assist in his care. The total time spent in the appointment was 60 minutes.  Disclaimer: This note was dictated with voice recognition software. Similar sounding words can inadvertently be transcribed and may not be corrected upon review.   Lajuana Matte April 25, 2023, 2:54 PM

## 2023-04-26 ENCOUNTER — Ambulatory Visit
Admission: RE | Admit: 2023-04-26 | Discharge: 2023-04-26 | Disposition: A | Discharge status: Home / Self Care | Payer: Medicare PPO | Source: Ambulatory Visit | Attending: Thoracic Surgery (Cardiothoracic Vascular Surgery) | Admitting: Thoracic Surgery (Cardiothoracic Vascular Surgery)

## 2023-04-26 ENCOUNTER — Ambulatory Visit (INDEPENDENT_AMBULATORY_CARE_PROVIDER_SITE_OTHER): Payer: Self-pay | Admitting: Surgical

## 2023-04-26 VITALS — BP 123/71 | HR 61 | Resp 20 | Ht 77.0 in | Wt 211.0 lb

## 2023-04-26 DIAGNOSIS — R911 Solitary pulmonary nodule: Secondary | ICD-10-CM

## 2023-04-26 DIAGNOSIS — Z09 Encounter for follow-up examination after completed treatment for conditions other than malignant neoplasm: Secondary | ICD-10-CM

## 2023-04-26 DIAGNOSIS — Z48813 Encounter for surgical aftercare following surgery on the respiratory system: Secondary | ICD-10-CM | POA: Diagnosis not present

## 2023-04-26 NOTE — Patient Instructions (Signed)
Discussed activity progression including advancing activities.  There are no specific restrictions at this time.

## 2023-04-26 NOTE — Progress Notes (Addendum)
[address]       [place] [address]             [phone]      THARON SCHLEIFER [facility] Medical Record #[id number] Date of Birth: February 17, 1946  Referring: Bevelyn Ngo, NP Primary Care: [name], MD Primary Cardiologist: Marjo Bicker, MD   Chief Complaint:   POST OP FOLLOW UP  03/22/2023   Patient:  Micheal Baker Pre-Op Dx: Left Lower Lobe NSCLC   Post-op Dx:  same Procedure: - Robotic assisted left video thoracoscopy - left lower lobectomy - Mediastinal lymph node sampling - Intercostal nerve block   Surgeon and Role:      * Lightfoot, Eliezer Lofts, MD - Primary   Assistant: Jacques Earthly, PA-C    Stage IA (T1a, N0, M0) non-small cell lung cancer, adenocarcinoma    History of Present Illness:    The patient is seen in the office on today's date and routine postsurgical follow-up status post the above described procedure.  Overall he reports that he is making pretty steady progress but does have some days that are somewhat better than others.  This is in regard to such things as energy level and strength.  He does describe some neuropathic type symptoms related to the surgical incisions.  There is some variation on this as well with the symptoms somewhat worse with higher levels of activity and less with routine or rest.   He is not having any significant shortness of breath.  He has had no difficulties with his incisions.  He saw medical oncology yesterday and they are not currently planning to do any further adjuvant treatment at this time but will continue to follow closely.  Past Medical History:  Diagnosis Date   Acute medial meniscus tear of left knee    Depression    Frequent PVCs    GERD (gastroesophageal reflux disease)    History of hiatal hernia    History of inguinal hernia    History of kidney stones    Hypertension    Hypothyroidism    Lung cancer (HCC)    NSVT (nonsustained ventricular tachycardia) (HCC)    Osteoarthritis    Sleep apnea    Thyroid disease      Social History   Tobacco Use  Smoking Status Never  Smokeless Tobacco Never  Tobacco Comments   06/10/14 1968- Tried it once and didn't like it- AJ    Social History   Substance and Sexual Activity  Alcohol Use Yes   Alcohol/week: 0.0 standard drinks of alcohol   Comment: occasionally     Allergies  Allergen Reactions   Avelox [Moxifloxacin] Other (See Comments)    Caused tendonitis   Penicillins Other (See Comments)    Chills     Current Outpatient Medications  Medication Sig Dispense Refill   acetaminophen (TYLENOL) 500 MG tablet Take 500-1,000 mg by mouth every 6 (six) hours as needed for mild pain, headache or moderate pain.     ALPHA LIPOIC ACID PO Take 1 capsule by mouth in the morning.     amiodarone (PACERONE) 200 MG tablet Take 1 tablet (200 mg total) by mouth daily for 7 days. 7 tablet 0   B Complex-C (B-COMPLEX WITH VITAMIN C) tablet Take 1 tablet by mouth in the morning.     buPROPion (WELLBUTRIN XL) 150 MG 24 hr tablet Take 150 mg by mouth daily.     calcium carbonate (TUMS - DOSED IN MG ELEMENTAL CALCIUM) 500 MG chewable tablet Chew 1 tablet (200 mg of elemental calcium total) by mouth 3 (three) times daily with meals. (Patient taking differently: Chew 1 tablet by mouth 3 (three) times daily as needed for indigestion or heartburn.) 90 tablet 4   diclofenac Sodium (VOLTAREN) 1 % GEL Apply 1 Application topically 4 (four) times daily as needed (knee pain).     doxylamine, Sleep, (UNISOM) 25 MG tablet Take 25 mg  by mouth at bedtime as needed for sleep.     escitalopram (LEXAPRO) 20 MG tablet Take 20 mg by mouth daily.     finasteride (PROSCAR) 5 MG tablet Take 1 tablet (5 mg total) by mouth daily. 90 tablet 3   fluticasone (FLONASE) 50 MCG/ACT nasal spray Place 1 spray into both nostrils daily as needed for allergies.     levothyroxine (SYNTHROID) 88 MCG tablet Take 1 tablet (88 mcg total) by mouth daily before breakfast. 30 tablet 2   pantoprazole (PROTONIX) 40 MG tablet Take 1 tablet (40 mg total) by mouth daily. 90 tablet 3   Propylene Glycol (SYSTANE COMPLETE) 0.6 % SOLN Place 1 drop into both eyes as needed (dry eyes).     silodosin (RAPAFLO) 8 MG CAPS capsule Take 1 capsule (8 mg total) by mouth 2 (two) times daily. 60 capsule 11   No current facility-administered medications for this visit.       Physical Exam: Ht 6\' 5"  (1.956 m)   Wt 211 lb (95.7 kg)   BMI 25.02 kg/m   General appearance: alert, cooperative, and no distress Heart: regular rate and rhythm and occasional extra systole Lungs: clear to auscultation bilaterally Extremities: No edema Wound: Incisions well-healed without evidence of infection   Diagnostic Studies &  Laboratory data:     Recent Radiology Findings:   No results found.    Recent Lab Findings: Lab Results  Component Value Date   WBC 6.7 04/25/2023   HGB 14.5 04/25/2023   HCT 40.8 04/25/2023   PLT 198 04/25/2023   GLUCOSE 101 (H) 04/25/2023   ALT 22 04/25/2023   AST 23 04/25/2023   NA 137 04/25/2023   K 4.1 04/25/2023   CL 103 04/25/2023   CREATININE 0.90 04/25/2023   BUN 21 04/25/2023   CO2 27 04/25/2023   TSH 5.294 (H) 02/09/2023   INR 1.0 03/20/2023      Assessment / Plan: The patient is doing well.  He is having some neuropathic type symptoms related to the incision which should get better with time.  We did discuss the possible use of gabapentin but he is not interested at this time as he feels he would like to avoid further  medications as he is currently on several.  I reviewed his x-ray and there are no concerning findings.  If the neuropathic symptoms do not improve and he is interested in further medication management he will contact our office.  We will see the patient again on a as needed basis for any surgically related needs or at request.  He is scheduled follow-up with oncology he will be doing surveillance.      Medication Changes: No orders of the defined types were placed in this encounter.     Rowe Clack, PA-C  04/26/2023 12:29 PM

## 2023-05-01 NOTE — Progress Notes (Signed)
[facility] - Clinic Note  05/03/2023     CHIEF COMPLAINT Patient presents for Retina Follow Up  HISTORY OF PRESENT ILLNESS: Micheal Baker is a 77 y.o. male who presents to the clinic today for:  No issues reported with vision HPI     Retina Follow Up   Patient presents with  Retinal Break/Detachment.  In both eyes.  This started years ago.  Duration of 1 year.  Since onset it is stable.  I, the attending physician,  performed the HPI with the patient and updated documentation appropriately.        Comments   Patient feels the vision is the same. He is using AT's OU PRN- TID. 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       Last edited by Micheal Chris, MD on 05/04/2023  3:35 PM.    Patient states he saw Dr. Zenaida Baker and she is just keeping an eye on his right eye cataract, he states he is using drops for dry eyes now, he had cancer removed from his left lung  Referring physician: [name], MD [address] [place],  [place] [address]  HISTORICAL INFORMATION:   Selected notes from the MEDICAL RECORD NUMBER     CURRENT MEDICATIONS: Current Outpatient Medications (Ophthalmic Drugs)  Medication Sig   Propylene Glycol (SYSTANE COMPLETE) 0.6 % SOLN Place 1 drop into both eyes as needed (dry eyes).   No current facility-administered medications for this visit. (Ophthalmic Drugs)   Current Outpatient Medications (Other)  Medication Sig   acetaminophen (TYLENOL) 500 MG tablet Take 500-1,000 mg by mouth every 6 (six) hours as needed for mild pain, headache or moderate pain.   ALPHA LIPOIC ACID PO Take 1 capsule by mouth in the morning.   B Complex-C (B-COMPLEX WITH VITAMIN C) tablet Take 1 tablet by mouth in the morning.   buPROPion (WELLBUTRIN XL) 150 MG 24 hr tablet Take 150 mg by mouth daily.   calcium carbonate (TUMS - DOSED IN MG ELEMENTAL CALCIUM) 500 MG chewable tablet Chew 1 tablet (200 mg of elemental calcium total) by mouth 3 (three) times  daily with meals. (Patient taking differently: Chew 1 tablet by mouth 3 (three) times daily as needed for indigestion or heartburn.)   diclofenac Sodium (VOLTAREN) 1 % GEL Apply 1 Application topically 4 (four) times daily as needed (knee pain).   doxylamine, Sleep, (UNISOM) 25 MG tablet Take 25 mg by mouth at bedtime as needed for sleep.   escitalopram (LEXAPRO) 20 MG tablet Take 20 mg by mouth daily.   finasteride (PROSCAR) 5 MG tablet Take 1 tablet (5 mg total) by mouth daily.   fluticasone (FLONASE) 50 MCG/ACT nasal spray Place 1 spray into both nostrils daily as needed for allergies.   levothyroxine (SYNTHROID) 88 MCG tablet Take 1 tablet (88 mcg total) by mouth daily before breakfast.   pantoprazole (PROTONIX) 40 MG tablet Take 1 tablet (40 mg total) by mouth daily.   silodosin (RAPAFLO) 8 MG CAPS capsule Take 1 capsule (8 mg total) by mouth 2 (two) times daily.   amiodarone (PACERONE) 200 MG tablet Take 1 tablet (200 mg total) by mouth daily for 7 days.   No current facility-administered medications for this visit. (Other)   REVIEW OF SYSTEMS: ROS   Positive for: Genitourinary, Endocrine, Eyes, Psychiatric Negative for: Constitutional, Gastrointestinal, Neurological, Skin, Musculoskeletal, HENT, Cardiovascular, Respiratory, Allergic/Imm, Heme/Lymph Last edited by Charlette Caffey, COT on 05/03/2023  1:59 PM.  ALLERGIES Allergies  Allergen Reactions   Avelox [Moxifloxacin] Other (See Comments)    Caused tendonitis   Penicillins Other (See Comments)    Chills    PAST MEDICAL HISTORY Past Medical History:  Diagnosis Date   Acute medial meniscus tear of left knee    Depression    Frequent PVCs    GERD (gastroesophageal reflux disease)    History of hiatal hernia    History of inguinal hernia    History of kidney stones    Hypertension    Hypothyroidism    Lung cancer (HCC)    NSVT (nonsustained ventricular tachycardia) (HCC)    Osteoarthritis    Sleep apnea     Thyroid disease    Past Surgical History:  Procedure Laterality Date   BIOPSY  12/01/2022   Procedure: BIOPSY;  Surgeon: Lanelle Bal, DO;  [facility];  Service: Endoscopy;;   BRONCHIAL BIOPSY  02/07/2023   Procedure: BRONCHIAL BIOPSIES;  Surgeon: Josephine Igo, DO;  [facility]: [facility];  Service: Pulmonary;;   BRONCHIAL NEEDLE ASPIRATION BIOPSY  02/07/2023   Procedure: BRONCHIAL NEEDLE ASPIRATION BIOPSIES;  Surgeon: Josephine Igo, DO;  Location: [facility];  Service: Pulmonary;;   CATARACT EXTRACTION     left   COLONOSCOPY N/A 04/08/2016   Surgeon: West Bali, MD; one 6 mm polyp in the cecum removed, one 4 mm polyp in the descending colon removed, nonbleeding internal hemorrhoids.  Pathology with hyperplastic polyps.  Recommended colonoscopy in 10 years.   COLONOSCOPY  12/01/2022   COLONOSCOPY WITH PROPOFOL N/A 12/01/2022   Procedure: COLONOSCOPY WITH PROPOFOL;  Surgeon: Lanelle Bal, DO;  [facility];  Service: Endoscopy;  Laterality: N/A;  8:15 am, asa 3   endosocopy  12/01/2022   ESOPHAGOGASTRODUODENOSCOPY (EGD) WITH PROPOFOL N/A 12/01/2022   Procedure: ESOPHAGOGASTRODUODENOSCOPY (EGD) WITH PROPOFOL;  Surgeon: Lanelle Bal, DO;  [facility];  Service: Endoscopy;  Laterality: N/A;   EYE SURGERY     left-scar tissue   FIDUCIAL MARKER PLACEMENT  02/07/2023   Procedure: FIDUCIAL MARKER PLACEMENT;  Surgeon: Josephine Igo, DO;  [facility]: [facility];  Service: Pulmonary;;   groin surgery Right 2010   growth removed    KNEE ARTHROSCOPY     left   KNEE ARTHROSCOPY WITH LATERAL MENISECTOMY Right 03/11/2015   Procedure: RIGHT KNEE ARTHROSCOPY WITH MEDIALAND LATERAL MENISECTOMY;  Surgeon: Salvatore Marvel, MD;  Location: [facility];  Service: Orthopedics;  Laterality: Right;   KNEE ARTHROSCOPY WITH MEDIAL MENISECTOMY Left 06/27/2014   Procedure: LEFT KNEE ARTHROSCOPY WITH PARTIAL MEDIAL AND LATERAL MENISECTOMIES AND  CHONDROPLASTY;  Surgeon: Nilda Simmer, MD;  Location: [facility];  Service: Orthopedics;  Laterality: Left;   KNEE ARTHROSCOPY WITH MEDIAL MENISECTOMY Right 03/11/2015   Procedure: KNEE ARTHROSCOPY WITH MEDIAL MENISECTOMY;  Surgeon: Salvatore Marvel, MD;  Location: [facility];  Service: Orthopedics;  Laterality: Right;   LEFT HEART CATHETERIZATION WITH CORONARY ANGIOGRAM N/A 09/14/2011   Procedure: LEFT HEART CATHETERIZATION WITH CORONARY ANGIOGRAM;  Surgeon: [name], MD;  Location: [facility];  Service: Cardiovascular;  Laterality: N/A;   LYMPH NODE DISSECTION Left 03/22/2023   Procedure: LYMPH NODE DISSECTION;  Surgeon: Corliss Skains, MD;  Location: [facility];  Service: Thoracic;  Laterality: Left;   POLYPECTOMY  04/08/2016   Procedure: POLYPECTOMY;  Surgeon: West Bali, MD;  [facility];  Service: Endoscopy;;  colon    POLYPECTOMY  12/01/2022  POLYPECTOMY  12/01/2022   Procedure: POLYPECTOMY;  Surgeon: Lanelle Bal, DO;  [facility];  Service: Endoscopy;;   RETINAL DETACHMENT SURGERY Left 2002   Dr. Hermina Barters   FAMILY HISTORY Family History  Problem Relation Age of Onset   Hypertension Mother    Hypertension Father    Heart attack Father    Diabetes Father    Asthma Daughter    Colon cancer Neg Hx    Stomach cancer Neg Hx    SOCIAL HISTORY Social History   Tobacco Use   Smoking status: Never   Smokeless tobacco: Never   Tobacco comments:    06/10/14 1968- Tried it once and didn't like it- AJ  Vaping Use   Vaping status: Never Used  Substance Use Topics   Alcohol use: Yes    Alcohol/week: 0.0 standard drinks of alcohol    Comment: occasionally   Drug use: No       OPHTHALMIC EXAM:  Base Eye Exam     Visual Acuity (Snellen - Linear)       Right Left   Dist [place] 20/25 +1 20/80   Dist ph [place] NI 20/40         Tonometry (Tonopen, 2:06 PM)       Right Left   Pressure 14 13          Pupils       Dark Light Shape React APD   Right 3 2 Round Brisk None   Left 3 2 Round Brisk None         Visual Fields       Left Right    Full Full         Extraocular Movement       Right Left    Full, Ortho Full, Ortho         Neuro/Psych     Oriented x3: Yes   Mood/Affect: Normal         Dilation     Both eyes: 1.0% Mydriacyl, 2.5% Phenylephrine @ 2:00 PM           Slit Lamp and Fundus Exam     Slit Lamp Exam       Right Left   Lids/Lashes Dermatochalasis - upper lid Dermatochalasis - upper lid   Conjunctiva/Sclera White and quiet White and quiet   Cornea Arcus, trace tear film debris Arcus, well healed temporal cataract wound, mild tear film debris   Anterior Chamber Deep, narrow angles Deep and quiet   Iris Round and dilated, mild anterior bowing Round and poorly dilated to 2.43mm, iridodinesis   Lens 2-3+ NS, 2-3+ Cortical PC IOL in good position   Anterior Vitreous Syneresis, PVD, vitreous debris settled inferiorly Syneresis         Fundus Exam       Right Left   Disc Pink and sharp, milld PPA 2+pallor, sharp rim   C/D Ratio 0.4 0.4   Macula Flat, blunted foveal reflex, mild RPE mottling, no heme or edema Flat, blunted foveal reflex, mild RPE mottling, No heme or edema   Vessels Mild attenuation, mild tortuosity Mild attenuation, Tortuous   Periphery Laser scars from 0800-1000 surrounding focal detachment -- good barricade in place; otherwise attached, no new RT/RD, mild Reticular degeneration Attached over scleral buckle; CR scarring from 1030-600 clockwise, limited view due to poor dilation, no new RT/RD.           Refraction     Manifest Refraction  Sphere Cylinder Axis Dist VA   Right       Left -2.50 +1.00 080 20/30            IMAGING AND PROCEDURES  Imaging and Procedures for 05/03/2023  OCT, Retina - OU - Both Eyes       Right Eye Quality was good. Central Foveal Thickness: 292. Progression has been stable.  Findings include normal foveal contour, no IRF, no SRF.   Left Eye Quality was good. Central Foveal Thickness: 349. Progression has been stable. Findings include no IRF, no SRF, abnormal foveal contour, macular pucker, outer retinal atrophy (Irregular inner-retinal surface. Focal CR atrophy temporal macula caught on widefield.).   Notes *Images captured and stored on drive  Diagnosis / Impression: History of RD OU -- retina attached OU OD: NFP; No IRF/SRF OS: Irregular inner-retinal surface.  Focal CR atrophy temporal macula caught on widefield. -- stable   Clinical management:  See below  Abbreviations: NFP - Normal foveal profile. CME - cystoid macular edema. PED - pigment epithelial detachment. IRF - intraretinal fluid. SRF - subretinal fluid. EZ - ellipsoid zone. ERM - epiretinal membrane. ORA - outer retinal atrophy. ORT - outer retinal tubulation. SRHM - subretinal hyper-reflective material. IRHM - intraretinal hyper-reflective material            ASSESSMENT/PLAN:    ICD-10-CM   1. History of retinal detachment  Z86.69 OCT, Retina - OU - Both Eyes    2. Combined forms of age-related cataract of right eye  H25.811     3. Pseudophakia  Z96.1       1. Hx of RD OU  - OD focal RD 8-10 oclock s/p laser retinopexy / barricade - early 2000s, Dr. Lily Kocher  - OS s/p RD repair early 2000s, Dr. Lily Kocher  - has been followed by Dr. Constance Goltz  - transferred care here due to proximity and transportation  - RD repairs stable OU  - no new RT/RD OU             - F/u in 1 yr, sooner prn -- DFE/OCT    2. Mixed Cataract OD - The symptoms of cataract, surgical options, and treatments and risks were discussed with patient. - discussed diagnosis and progression - now under the expert management of Dr. Zenaida Baker  3. Pseudophakia OS  - s/p CE/IOL OS - unknown cataract surgeon at [facility]  - IOL in good position, doing well  - continue to monitor  Ophthalmic Meds Ordered this visit:   No orders of the defined types were placed in this encounter.     Return in about 1 year (around 05/02/2024) for f/u hx of RD OU, DFE, OCT.  There are no Patient Instructions on file for this visit.  Explained the diagnoses, plan, and follow up with the patient and they expressed understanding.  Patient expressed understanding of the importance of proper follow up care.  This document serves as a record of services personally performed by Karie Chimera, MD, PhD. It was created on their behalf by De Blanch, an ophthalmic technician. The creation of this record is the provider's dictation and/or activities during the visit.    Electronically signed by: De Blanch, OA, 05/04/23  3:38 PM  This document serves as a record of services personally performed by Karie Chimera, MD, PhD. It was created on their behalf by Glee Arvin. Manson Passey, OA an ophthalmic technician. The creation of this record is the provider's dictation and/or activities during the visit.  Electronically signed by: Glee Arvin. Manson Passey, OA 05/04/23 3:38 PM   Karie Chimera, M.D., Ph.D. Diseases & Surgery of the Retina and Vitreous [facility]  I have reviewed the above documentation for accuracy and completeness, and I agree with the above. Karie Chimera, M.D., Ph.D. 05/04/23 3:39 PM   Abbreviations: M myopia (nearsighted); A astigmatism; H hyperopia (farsighted); P presbyopia; Mrx spectacle prescription;  CTL contact lenses; OD right eye; OS left eye; OU both eyes  XT exotropia; ET esotropia; PEK punctate epithelial keratitis; PEE punctate epithelial erosions; DES dry eye syndrome; MGD meibomian gland dysfunction; ATs artificial tears; PFAT's preservative free artificial tears; NSC nuclear sclerotic cataract; PSC posterior subcapsular cataract; ERM epi-retinal membrane; PVD posterior vitreous detachment; RD retinal detachment; DM diabetes mellitus; DR diabetic retinopathy; NPDR  non-proliferative diabetic retinopathy; PDR proliferative diabetic retinopathy; CSME clinically significant macular edema; DME diabetic macular edema; dbh dot blot hemorrhages; CWS cotton wool spot; POAG primary open angle glaucoma; C/D cup-to-disc ratio; HVF humphrey visual field; GVF goldmann visual field; OCT optical coherence tomography; IOP intraocular pressure; BRVO Branch retinal vein occlusion; CRVO central retinal vein occlusion; CRAO central retinal artery occlusion; BRAO branch retinal artery occlusion; RT retinal tear; SB scleral buckle; PPV pars plana vitrectomy; VH Vitreous hemorrhage; PRP panretinal laser photocoagulation; IVK intravitreal kenalog; VMT vitreomacular traction; MH Macular hole;  NVD neovascularization of the disc; NVE neovascularization elsewhere; AREDS age related eye disease study; ARMD age related macular degeneration; POAG primary open angle glaucoma; EBMD epithelial/anterior basement membrane dystrophy; ACIOL anterior chamber intraocular lens; IOL intraocular lens; PCIOL posterior chamber intraocular lens; Phaco/IOL phacoemulsification with intraocular lens placement; PRK photorefractive keratectomy; LASIK laser assisted in situ keratomileusis; HTN hypertension; DM diabetes mellitus; COPD chronic obstructive pulmonary disease

## 2023-05-03 ENCOUNTER — Encounter (INDEPENDENT_AMBULATORY_CARE_PROVIDER_SITE_OTHER): Payer: Self-pay | Admitting: Ophthalmology

## 2023-05-03 ENCOUNTER — Ambulatory Visit (INDEPENDENT_AMBULATORY_CARE_PROVIDER_SITE_OTHER): Payer: Medicare PPO | Admitting: Ophthalmology

## 2023-05-03 DIAGNOSIS — Z8669 Personal history of other diseases of the nervous system and sense organs: Secondary | ICD-10-CM | POA: Diagnosis not present

## 2023-05-03 DIAGNOSIS — H25811 Combined forms of age-related cataract, right eye: Secondary | ICD-10-CM

## 2023-05-03 DIAGNOSIS — Z961 Presence of intraocular lens: Secondary | ICD-10-CM

## 2023-05-04 ENCOUNTER — Encounter (INDEPENDENT_AMBULATORY_CARE_PROVIDER_SITE_OTHER): Payer: Self-pay | Admitting: Ophthalmology

## 2023-05-12 ENCOUNTER — Other Ambulatory Visit: Payer: Self-pay

## 2023-05-12 DIAGNOSIS — J90 Pleural effusion, not elsewhere classified: Secondary | ICD-10-CM

## 2023-05-17 ENCOUNTER — Ambulatory Visit: Payer: Self-pay

## 2023-05-22 ENCOUNTER — Telehealth: Payer: Self-pay | Admitting: *Deleted

## 2023-05-22 NOTE — Telephone Encounter (Signed)
Patient called the office stating the "rumbling, bubbles" he feels in his left chest are back. States he was to be seen last week with a CXR but canceled his appt stating the sensation got better. Over the weekend the sensation came back with deep breathing. Patient requesting to be seen again with cxr. States he is SOB but states he feels like that is his new normal. States he has a cough with deep breathing. Appt with cxr rescheduled for tomorrow. Patient aware.

## 2023-05-22 NOTE — Progress Notes (Unsigned)
[address]       Micheal Baker [address]             [phone]       HPI: Micheal Baker is a 77 year old male with a past medical history of hypertension, hypothyroidism, BPH, and adenocarcinoma of the lung. He underwent robotic assisted left lower lobectomy by Dr. Cliffton Asters on 03/22/23. The patient's early postoperative recovery while in the hospital was notable for atrial fibrillation with RVR with chemical conversion via Amiodarone but he otherwise had a routine postoperative recovery.  Since hospital discharge the patient reports he is overall doing well but he noticed a "gurgling" from his left chest with deep breaths. He states this started about 1 week ago and he had an office visit scheduled with [place] but canceled this because it resolved. The symptoms recurred and he rescheduled his appointment. He denies fever, chills, fatigue, chest pain, dizziness and LOC. He admits to possibly some shortness of breath and a dry cough. The last time he felt it was last night. He does admit to discomfort across his chest at times but has had this since before surgery and sees a cardiologist. He does admit that he has been able to slowly restarts his workout program.   Current Outpatient Medications  Medication Sig Dispense Refill   acetaminophen (TYLENOL) 500 MG tablet Take 500-1,000 mg by mouth every 6 (six) hours as needed for mild pain, headache or moderate pain.     ALPHA LIPOIC ACID PO Take 1 capsule by mouth in the morning.     amiodarone (PACERONE) 200 MG tablet Take 1 tablet (200 mg total) by mouth daily for 7 days. 7 tablet 0   B Complex-C (B-COMPLEX WITH VITAMIN C) tablet Take 1 tablet by mouth in the morning.     buPROPion (WELLBUTRIN XL) 150 MG 24 hr tablet Take 150 mg by mouth daily.     calcium carbonate (TUMS - DOSED IN MG ELEMENTAL CALCIUM) 500 MG chewable tablet Chew 1 tablet (200 mg of elemental calcium total) by mouth 3 (three) times daily with meals. (Patient  taking differently: Chew 1 tablet by mouth 3 (three) times daily as needed for indigestion or heartburn.) 90 tablet 4   diclofenac Sodium (VOLTAREN) 1 % GEL Apply 1 Application topically 4 (four) times daily as needed (knee pain).     doxylamine, Sleep, (UNISOM) 25 MG tablet Take 25 mg by mouth at bedtime as needed for sleep.     escitalopram (LEXAPRO) 20 MG tablet Take 20 mg by mouth daily.     finasteride (PROSCAR) 5 MG tablet Take 1 tablet (5 mg total) by mouth daily. 90 tablet 3   fluticasone (FLONASE) 50 MCG/ACT nasal spray Place 1 spray into both nostrils daily as needed for allergies.     levothyroxine (SYNTHROID) 88 MCG tablet Take 1 tablet (88 mcg total) by mouth daily before breakfast. 30 tablet 2   pantoprazole (PROTONIX) 40 MG tablet Take 1 tablet (40 mg total) by mouth daily. 90 tablet 3   Propylene Glycol (SYSTANE COMPLETE) 0.6 % SOLN Place 1 drop into both eyes as needed (dry eyes).     silodosin (RAPAFLO) 8 MG CAPS capsule Take 1 capsule (8 mg total) by mouth 2 (two) times daily. 60 capsule 11   No current facility-administered medications for this visit.   Vitals: Today's Vitals   05/23/23 0924  BP: 118/69  Pulse: 69  Resp: 20  SpO2: 97%  Weight: 213 lb (96.6 kg)  Height: 6\' 5"  (1.956 m)   Body mass index is 25.26 kg/m.  Physical Exam: General: Alert and oriented, no acute distress Neuro: Grossly intact CV: regular rate and rhythm, no murmur Pulm: Clear to auscultation bilaterally, slightly diminished breath sounds at left base Extremities: No edema  Diagnostic Tests: CLINICAL DATA:  Shortness of breath   Pleural effusion   History of lung cancer   EXAM: CHEST - 2 VIEW   COMPARISON:  04/26/2023   03/26/2023   FINDINGS: Cardiomediastinal silhouette and pulmonary vasculature are within normal limits.   Worsening triangular opacity in the retrocardiac space suspicious for progressive left lower lobe collapse. Trace left pleural effusion.    Bilateral small calcified pulmonary nodules consistent with prior granulomatous inflammation. Multiple calcified mediastinal lymph nodes also noted.   IMPRESSION: Worsening triangular opacity in the retrocardiac region is suspicious for progressive left lower lobe atelectasis. Further evaluation with contrast-enhanced chest CT should be performed evaluate for endobronchial lesion or mucous plug.     Electronically Signed   By: Acquanetta Belling M.D.   On: 05/23/2023 10:21    Impression/Plan: S/P LLL: Patient has overall done well since his left lower lobectomy but began experiencing left sided "gurgling" with deep breaths about 1 week ago. He admits to minor shortness of breath but denies chest pain, fevers, and chills. He does admit to a dry cough. On CXR he has a worsening opacity suspicious for progressive atelectasis and CT with contrast is recommended. Will get a CT with contrast and have him follow up with a PA to review the results. I also recommended he return to using his incentive spirometer and increase ambulation as tolerated.   Micheal Reichmann, PA-C [facility] [phone]

## 2023-05-23 ENCOUNTER — Ambulatory Visit
Admission: RE | Admit: 2023-05-23 | Discharge: 2023-05-23 | Disposition: A | Discharge status: Home / Self Care | Payer: Medicare PPO | Source: Ambulatory Visit | Attending: Thoracic Surgery (Cardiothoracic Vascular Surgery) | Admitting: Thoracic Surgery (Cardiothoracic Vascular Surgery)

## 2023-05-23 ENCOUNTER — Encounter: Payer: Self-pay | Admitting: Physician Assistant

## 2023-05-23 ENCOUNTER — Ambulatory Visit (INDEPENDENT_AMBULATORY_CARE_PROVIDER_SITE_OTHER): Payer: Self-pay | Admitting: Physician Assistant

## 2023-05-23 VITALS — BP 118/69 | HR 69 | Resp 20 | Ht 77.0 in | Wt 213.0 lb

## 2023-05-23 DIAGNOSIS — R0602 Shortness of breath: Secondary | ICD-10-CM | POA: Diagnosis not present

## 2023-05-23 DIAGNOSIS — J90 Pleural effusion, not elsewhere classified: Secondary | ICD-10-CM

## 2023-05-23 DIAGNOSIS — Z09 Encounter for follow-up examination after completed treatment for conditions other than malignant neoplasm: Secondary | ICD-10-CM

## 2023-05-23 DIAGNOSIS — J984 Other disorders of lung: Secondary | ICD-10-CM | POA: Diagnosis not present

## 2023-05-25 NOTE — Progress Notes (Signed)
[address]       Micheal Baker [address]             [phone]     HPI:  Mr. Doctor is a 77 year old male with a past medical history of hypertension, hypothyroidism, BPH, and adenocarcinoma of the lung. He underwent robotic assisted left lower lobectomy by Dr. Cliffton Asters on 03/22/23. The patient's early postoperative recovery while in the hospital was notable for atrial fibrillation with RVR with chemical conversion via Amiodarone but he otherwise had a routine postoperative recovery.  Since hospital discharge the patient reports he is overall doing well but he noticed a "gurgling" from his left chest with deep breaths. He states this started about 1 week ago and he had an office visit scheduled with [place] but canceled this because it resolved. The symptoms recurred and he rescheduled his appointment.  He was evaluated by Aloha Gell PA-C who recommended the patient undergo a CT of the chest which was performed on 9/17 and he presents for results. The patient continues to have some continued episodes of "gurgling" he states they last 2-3 days then resolve.  He also notes he has some discomfort along his left upper abdomen.  He denies fevers.   Current Outpatient Medications  Medication Sig Dispense Refill   acetaminophen (TYLENOL) 500 MG tablet Take 500-1,000 mg by mouth every 6 (six) hours as needed for mild pain, headache or moderate pain.     ALPHA LIPOIC ACID PO Take 1 capsule by mouth in the morning.     B Complex-C (B-COMPLEX WITH VITAMIN C) tablet Take 1 tablet by mouth in the morning.     buPROPion (WELLBUTRIN XL) 150 MG 24 hr tablet Take 150 mg by mouth daily.     calcium carbonate (TUMS - DOSED IN MG ELEMENTAL CALCIUM) 500 MG chewable tablet Chew 1 tablet (200 mg of elemental calcium total) by mouth 3 (three) times daily with meals. (Patient taking differently: Chew 1 tablet by mouth 3 (three) times daily as needed for indigestion or heartburn.) 90 tablet 4    diclofenac Sodium (VOLTAREN) 1 % GEL Apply 1 Application topically 4 (four) times daily as needed (knee pain).     doxylamine, Sleep, (UNISOM) 25 MG tablet Take 25 mg by mouth at bedtime as needed for sleep.     escitalopram (LEXAPRO) 20 MG tablet Take 20 mg by mouth daily.     finasteride (PROSCAR) 5 MG tablet Take 1 tablet (5 mg total) by mouth daily. 90 tablet 3   fluticasone (FLONASE) 50 MCG/ACT nasal spray Place 1 spray into both nostrils daily as needed for allergies.     levothyroxine (SYNTHROID) 88 MCG tablet Take 1 tablet (88 mcg total) by mouth daily before breakfast. 30 tablet 2   pantoprazole (PROTONIX) 40 MG tablet Take 1 tablet (40 mg total) by mouth daily. 90 tablet 3   Propylene Glycol (SYSTANE COMPLETE) 0.6 % SOLN Place 1 drop into both eyes as needed (dry eyes).     silodosin (RAPAFLO) 8 MG CAPS capsule Take 1 capsule (8 mg total) by mouth 2 (two) times daily. 60 capsule 11   No current facility-administered medications for this visit.    Physical Exam:  BP 124/77 (BP Location: Right Arm, Patient Position: Sitting, Cuff Size: Normal)   Pulse 72   Resp 20   Ht 6\' 5"  (1.956 m)   Wt 212 lb 6.4 oz (96.3 kg)   SpO2 96% Comment: RA  BMI 25.19 kg/m   Gen: NAD Heart: RRR Lungs: mildly diminished left base Incisions: well healed, minor sensitivity along left side  Diagnostic Tests:  CT Scan- performed 9/17.. unfortunately this has no yet been read.  I reviewed with Dr. Cliffton Asters with evidence of left pleural effusion  A/P:  S/P Robotic Assisted Left Lower Lobectomy performed by Dr. Cliffton Asters on 03/22/2023.  Overall he is doing well. He has some complaints of occasional pain and gurgling along his left side.  CT scan was ordered (not read).. shows a left sided pleural effusion  Will refer to Pulmonary for Thoracentesis as discussed with Dr. Cliffton Asters.. RTC prn  Lowella Dandy, PA-C [facility] [phone]

## 2023-05-30 ENCOUNTER — Ambulatory Visit (HOSPITAL_COMMUNITY)
Admission: RE | Admit: 2023-05-30 | Discharge: 2023-05-30 | Disposition: A | Discharge status: Home / Self Care | Payer: Medicare PPO | Source: Ambulatory Visit | Attending: Internal Medicine | Admitting: Internal Medicine

## 2023-05-30 ENCOUNTER — Other Ambulatory Visit (HOSPITAL_COMMUNITY): Payer: Medicare PPO

## 2023-05-30 DIAGNOSIS — C3490 Malignant neoplasm of unspecified part of unspecified bronchus or lung: Secondary | ICD-10-CM | POA: Insufficient documentation

## 2023-05-30 DIAGNOSIS — J90 Pleural effusion, not elsewhere classified: Secondary | ICD-10-CM | POA: Diagnosis not present

## 2023-05-30 DIAGNOSIS — I700 Atherosclerosis of aorta: Secondary | ICD-10-CM | POA: Diagnosis not present

## 2023-05-30 MED ORDER — IOHEXOL 350 MG/ML SOLN
50.0000 mL | Freq: Once | INTRAVENOUS | Status: AC | PRN
  Administered 2023-05-30: 50 mL via INTRAVENOUS

## 2023-06-01 ENCOUNTER — Ambulatory Visit: Payer: Medicare PPO | Admitting: Thoracic Surgery (Cardiothoracic Vascular Surgery)

## 2023-06-01 ENCOUNTER — Ambulatory Visit (INDEPENDENT_AMBULATORY_CARE_PROVIDER_SITE_OTHER): Payer: Self-pay | Admitting: Physician Assistant

## 2023-06-01 VITALS — BP 124/77 | HR 72 | Resp 20 | Ht 77.0 in | Wt 212.4 lb

## 2023-06-01 DIAGNOSIS — Z902 Acquired absence of lung [part of]: Secondary | ICD-10-CM

## 2023-06-07 ENCOUNTER — Ambulatory Visit: Payer: Medicare PPO | Admitting: Pulmonary Disease

## 2023-06-13 ENCOUNTER — Ambulatory Visit: Payer: Self-pay | Admitting: Licensed Clinical Social Worker

## 2023-06-13 NOTE — Patient Instructions (Signed)
Visit Information  Thank you for taking time to visit with me today. Please don't hesitate to contact me if I can be of assistance to you.   Following are the goals we discussed today:   Goals Addressed             This Visit's Progress    Patient Stated he has decreased energy occasionally       Interventions:  Spoke with client via phone today about his current status and needs faced Spoke with client about medication procurement Spoke with client about support of Oncologist through [facility] in [place], [place] Discussed ambulation of client. He said he is walking well. He likes to walk for exercise, when he is able to do so Discussed program support with RN, LCSW, Pharmacist Provided counseling support for client Discussed family support. Client has support of his children.  Discussed sleeping issues of client. He has some sleeping difficulty Reviewed mood of client. Client is in positive mood. He feels that his mood is stable at present. He is taking medications as prescribed. Discussed relaxation techniques: walking for exercise, listening to music, watching favorite sports on TV Thanked client for phone call with LCSW Encouraged Micheal Baker to call LCSW as needed for SW support at [phone].          Our next appointment is by telephone on 08/07/23 at 3:00 PM   Please call the care guide team at [phone] if you need to cancel or reschedule your appointment.   If you are experiencing a Mental Health or Behavioral Health Crisis or need someone to talk to, please go to [facility] [address] [place] [phone])   The patient verbalized understanding of instructions, educational materials, and care plan provided today and DECLINED offer to receive copy of patient instructions, educational materials, and care plan.   The patient has been provided with contact information for the care management team and has been  advised to call with any health related questions or concerns.   Kelton Pillar.Zenita Kister MSW, LCSW Licensed Visual merchandiser [facility] Care Management [phone]

## 2023-06-13 NOTE — Patient Outreach (Signed)
Care Coordination   Follow Up Visit Note   06/13/2023 Name: Micheal Baker MRN: [id number] DOB: 07/04/1946  Micheal Baker is a 77 y.o. year old male who sees [name], MD for primary care. I spoke with  [name] by phone today.  What matters to the patients health and wellness today?  Patient has decreased energy occasionally    Goals Addressed             This Visit's Progress    Patient Stated he has decreased energy occasionally       Interventions:  Spoke with client via phone today about his current status and needs faced Spoke with client about medication procurement Spoke with client about support of Oncologist through [facility] in [place], [place] Discussed ambulation of client. He said he is walking well. He likes to walk for exercise, when he is able to do so Discussed program support with RN, LCSW, Pharmacist Provided counseling support for client Discussed family support. Client has support of his children.  Discussed sleeping issues of client. He has some sleeping difficulty Reviewed mood of client. Client is in positive mood. He feels that his mood is stable at present. He is taking medications as prescribed. Discussed relaxation techniques: walking for exercise, listening to music, watching favorite sports on TV Thanked client for phone call with LCSW Encouraged Kentarius to call LCSW as needed for SW support at [phone].          SDOH assessments and interventions completed:  Yes  SDOH Interventions Today    Flowsheet Row Most Recent Value  SDOH Interventions   Depression Interventions/Treatment  Counseling  Physical Activity Interventions Other (Comments)  [likes to walk for exercise]  Stress Interventions Provide Counseling  [has stress in managing medical needs]        Care Coordination Interventions:  Yes, provided   Interventions Today    Flowsheet Row Most Recent Value  Chronic Disease   Chronic disease during today's  visit Other  [spoke with client about client needs]  General Interventions   General Interventions Discussed/Reviewed General Interventions Discussed, Community Resources  Exercise Interventions   Exercise Discussed/Reviewed Physical Activity  [likes to walk for exercise]  Education Interventions   Education Provided Provided Education  Provided Verbal Education On [employer]  Mental Health Interventions   Mental Health Discussed/Reviewed Coping Strategies  [no mood issues noted]  Nutrition Interventions   Nutrition Discussed/Reviewed Nutrition Discussed  Pharmacy Interventions   Pharmacy Dicussed/Reviewed Pharmacy Topics Discussed       Follow up plan: Follow up call scheduled for 08/07/23 at 3:00 PM    Encounter Outcome:  Patient Visit Completed   Kelton Pillar.Sherrill Mckamie MSW, LCSW Licensed Visual merchandiser [facility] Care Management [phone]

## 2023-06-14 DIAGNOSIS — Z23 Encounter for immunization: Secondary | ICD-10-CM | POA: Diagnosis not present

## 2023-06-29 ENCOUNTER — Ambulatory Visit: Payer: Medicare PPO | Admitting: Pulmonary Disease

## 2023-07-10 ENCOUNTER — Telehealth: Payer: Self-pay | Admitting: Pulmonary Disease

## 2023-07-10 NOTE — Telephone Encounter (Signed)
Patient checking on procedure for fluid on the lungs. Patient phone number is [phone].

## 2023-07-10 NOTE — Telephone Encounter (Signed)
Spoke with the pt  He states that he came here for thora on 06/29/23 and the [place] was not working  He says they scheduled him for ov to come back on 09/27/23  He feels okay with his breathing, but wants to know if ok to wait this long for f/u  He asks what affect this will have on him if he waits  Please advise thanks

## 2023-07-10 NOTE — Telephone Encounter (Signed)
Micheal Igo, DO  to Me     07/10/23 11:54 AM  Sounds like he needs to see someone else sooner Thanks BLI   The issue is that no one here including APPs have any available appts until Jan 2025

## 2023-07-14 DIAGNOSIS — M25561 Pain in right knee: Secondary | ICD-10-CM | POA: Diagnosis not present

## 2023-07-14 DIAGNOSIS — M9902 Segmental and somatic dysfunction of thoracic region: Secondary | ICD-10-CM | POA: Diagnosis not present

## 2023-07-14 DIAGNOSIS — M9905 Segmental and somatic dysfunction of pelvic region: Secondary | ICD-10-CM | POA: Diagnosis not present

## 2023-07-14 DIAGNOSIS — M9903 Segmental and somatic dysfunction of lumbar region: Secondary | ICD-10-CM | POA: Diagnosis not present

## 2023-07-14 DIAGNOSIS — M62830 Muscle spasm of back: Secondary | ICD-10-CM | POA: Diagnosis not present

## 2023-07-17 ENCOUNTER — Telehealth: Payer: Self-pay | Admitting: *Deleted

## 2023-07-17 NOTE — Telephone Encounter (Signed)
Josephine Igo, DO  to Me     07/17/23 10:05 AM  Then refer out or see PCP I am not sure what else to offer.  BLI      I called the pt and notified of response per Dr Tonia Brooms.  Nothing further needed

## 2023-07-17 NOTE — Telephone Encounter (Signed)
Patient contacted the office stating he is unable to be seen by Dr. Tonia Brooms for thoracentesis until January. CT scan reviewed with Gaynelle Arabian, PA, advised patient that the fluid collection would be too small for thoracentesis. Patient states he is not SOB and has not experienced the "gurgling" sensation in over two weeks. Patient advised to call if SOB occurs.

## 2023-07-19 DIAGNOSIS — M65341 Trigger finger, right ring finger: Secondary | ICD-10-CM | POA: Diagnosis not present

## 2023-07-24 ENCOUNTER — Other Ambulatory Visit: Payer: Self-pay | Admitting: Gastroenterology

## 2023-07-27 DIAGNOSIS — K219 Gastro-esophageal reflux disease without esophagitis: Secondary | ICD-10-CM | POA: Diagnosis not present

## 2023-07-27 DIAGNOSIS — E039 Hypothyroidism, unspecified: Secondary | ICD-10-CM | POA: Diagnosis not present

## 2023-07-27 DIAGNOSIS — M1990 Unspecified osteoarthritis, unspecified site: Secondary | ICD-10-CM | POA: Diagnosis not present

## 2023-07-27 DIAGNOSIS — N529 Male erectile dysfunction, unspecified: Secondary | ICD-10-CM | POA: Diagnosis not present

## 2023-07-27 DIAGNOSIS — N400 Enlarged prostate without lower urinary tract symptoms: Secondary | ICD-10-CM | POA: Diagnosis not present

## 2023-07-27 DIAGNOSIS — G4733 Obstructive sleep apnea (adult) (pediatric): Secondary | ICD-10-CM | POA: Diagnosis not present

## 2023-07-27 DIAGNOSIS — F419 Anxiety disorder, unspecified: Secondary | ICD-10-CM | POA: Diagnosis not present

## 2023-07-27 DIAGNOSIS — R32 Unspecified urinary incontinence: Secondary | ICD-10-CM | POA: Diagnosis not present

## 2023-07-27 DIAGNOSIS — J309 Allergic rhinitis, unspecified: Secondary | ICD-10-CM | POA: Diagnosis not present

## 2023-07-31 ENCOUNTER — Ambulatory Visit: Payer: Medicare PPO | Admitting: Adult Health

## 2023-08-07 ENCOUNTER — Encounter: Payer: Self-pay | Admitting: Licensed Clinical Social Worker

## 2023-08-09 ENCOUNTER — Ambulatory Visit: Payer: Self-pay | Admitting: Licensed Clinical Social Worker

## 2023-08-09 DIAGNOSIS — M170 Bilateral primary osteoarthritis of knee: Secondary | ICD-10-CM | POA: Diagnosis not present

## 2023-08-09 NOTE — Patient Instructions (Signed)
Visit Information  Thank you for taking time to visit with me today. Please don't hesitate to contact me if I can be of assistance to you.   Following are the goals we discussed today:   Goals Addressed             This Visit's Progress    Patient Stated he has decreased energy occasionally       Interventions:  Spoke with client via phone today about his current status and needs faced.  He said he is trying to manage medical needs faced Spoke with client about medication procurement Spoke with client about support of Oncologist through [facility] in [place], [place] Discussed ambulation of client. He said he is walking well. He likes to walk for exercise, when he is able to do so. He likes to go to the gym, when he is able, to do exercise activities Discussed pain issues. He spoke of knee pain issues. He has appointment today with medical provider regarding knee pain issues Discussed program support with RN, LCSW, Pharmacist Provided counseling support for client Discussed family support. Client has support of his children.  Discussed sleeping issues of client. He has some sleeping difficulty Discussed transport needs. He said he drives himself to appointments and to complete errands in the community Client spoke of occasional fluid build up in one of his lungs. He said he is on a waiting list to have test to scan fluid buildup in his lung.   Discussed energy level. He said he sometimes has reduced energy Thanked client for phone call with LCSW today Encouraged Kedric to call LCSW as needed for SW support at [phone].          Our next appointment is by telephone on 10/02/23 at 10:30 AM   Please call the care guide team at [phone] if you need to cancel or reschedule your appointment.   If you are experiencing a Mental Health or Behavioral Health Crisis or need someone to talk to, please go to [facility] [address],  [place] [phone])   The patient verbalized understanding of instructions, educational materials, and care plan provided today and DECLINED offer to receive copy of patient instructions, educational materials, and care plan.   The patient has been provided with contact information for the care management team and has been advised to call with any health related questions or concerns.   Kelton Pillar.Camrynn Mcclintic MSW, LCSW Licensed Visual merchandiser [facility] Care Management [phone]

## 2023-08-09 NOTE — Patient Outreach (Signed)
  Care Coordination   Follow Up Visit Note   08/09/2023 Name: Micheal Baker MRN: [id number] DOB: 12-Sep-1946  Micheal Baker is a 77 y.o. year old male who sees [name], MD for primary care. I spoke with  [name] by phone today.  What matters to the patients health and wellness today?  Patient stated he has decreased energy  Occasionally     Goals Addressed             This Visit's Progress    Patient Stated he has decreased energy occasionally       Interventions:  Spoke with client via phone today about his current status and needs faced.  He said he is trying to manage medical needs faced Spoke with client about medication procurement Spoke with client about support of Oncologist through [facility] in [place], [place] Discussed ambulation of client. He said he is walking well. He likes to walk for exercise, when he is able to do so. He likes to go to the gym, when he is able, to do exercise activities Discussed pain issues. He spoke of knee pain issues. He has appointment today with medical provider regarding knee pain issues Discussed program support with RN, LCSW, Pharmacist Provided counseling support for client Discussed family support. Client has support of his children.  Discussed sleeping issues of client. He has some sleeping difficulty Discussed transport needs. He said he drives himself to appointments and to complete errands in the community Client spoke of occasional fluid build up in one of his lungs. He said he is on a waiting list to have test to scan fluid buildup in his lung.   Discussed energy level. He said he sometimes has reduced energy Thanked client for phone call with LCSW today Encouraged Jerrik to call LCSW as needed for SW support at [phone].          SDOH assessments and interventions completed:  Yes  SDOH Interventions Today    Flowsheet Row Most Recent Value  SDOH Interventions   Depression Interventions/Treatment   Counseling  Physical Activity Interventions Other (Comments)  [likes to walk for exercise. likes to go to gym, when he is able, to do exercise activities]  Stress Interventions Other (Comment)  [client has stress in managing medical needs]        Care Coordination Interventions:  Yes, provided    Interventions Today    Flowsheet Row Most Recent Value  Chronic Disease   Chronic disease during today's visit Other  [spoke with client about client needs]  General Interventions   General Interventions Discussed/Reviewed General Interventions Discussed, Community Resources  Education Interventions   Education Provided Provided Education  Provided Engineer, petroleum On [employer]  Mental Health Interventions   Mental Health Discussed/Reviewed Coping Strategies  [discussed mood of client. discussed coping skills of client]  Nutrition Interventions   Nutrition Discussed/Reviewed Nutrition Discussed  Pharmacy Interventions   Pharmacy Dicussed/Reviewed Pharmacy Topics Discussed  Safety Interventions   Safety Discussed/Reviewed Fall Risk       Follow up plan: Follow up call scheduled for 10/02/23 at 10:30 AM    Encounter Outcome:  Patient Visit Completed   Kelton Pillar.Laira Penninger MSW, LCSW Licensed Visual merchandiser [facility] Care Management [phone]

## 2023-08-17 DIAGNOSIS — E559 Vitamin D deficiency, unspecified: Secondary | ICD-10-CM | POA: Diagnosis not present

## 2023-08-17 DIAGNOSIS — M170 Bilateral primary osteoarthritis of knee: Secondary | ICD-10-CM | POA: Diagnosis not present

## 2023-08-17 DIAGNOSIS — E538 Deficiency of other specified B group vitamins: Secondary | ICD-10-CM | POA: Diagnosis not present

## 2023-08-17 DIAGNOSIS — E663 Overweight: Secondary | ICD-10-CM | POA: Diagnosis not present

## 2023-08-17 DIAGNOSIS — R5383 Other fatigue: Secondary | ICD-10-CM | POA: Diagnosis not present

## 2023-08-17 DIAGNOSIS — Z6826 Body mass index (BMI) 26.0-26.9, adult: Secondary | ICD-10-CM | POA: Diagnosis not present

## 2023-08-17 DIAGNOSIS — E039 Hypothyroidism, unspecified: Secondary | ICD-10-CM | POA: Diagnosis not present

## 2023-08-17 DIAGNOSIS — G4730 Sleep apnea, unspecified: Secondary | ICD-10-CM | POA: Diagnosis not present

## 2023-08-24 DIAGNOSIS — M170 Bilateral primary osteoarthritis of knee: Secondary | ICD-10-CM | POA: Diagnosis not present

## 2023-08-29 ENCOUNTER — Telehealth: Payer: Self-pay | Admitting: Nurse Practitioner

## 2023-08-29 ENCOUNTER — Encounter: Payer: Self-pay | Admitting: Emergency Medicine

## 2023-08-29 ENCOUNTER — Ambulatory Visit: Payer: Medicare PPO

## 2023-08-29 ENCOUNTER — Other Ambulatory Visit: Payer: Self-pay

## 2023-08-29 ENCOUNTER — Ambulatory Visit
Admission: EM | Admit: 2023-08-29 | Discharge: 2023-08-29 | Disposition: A | Discharge status: Home / Self Care | Payer: Medicare PPO | Attending: Nurse Practitioner | Admitting: Nurse Practitioner

## 2023-08-29 DIAGNOSIS — D71 Functional disorders of polymorphonuclear neutrophils: Secondary | ICD-10-CM | POA: Diagnosis not present

## 2023-08-29 DIAGNOSIS — J069 Acute upper respiratory infection, unspecified: Secondary | ICD-10-CM | POA: Diagnosis not present

## 2023-08-29 DIAGNOSIS — R062 Wheezing: Secondary | ICD-10-CM

## 2023-08-29 DIAGNOSIS — J984 Other disorders of lung: Secondary | ICD-10-CM | POA: Diagnosis not present

## 2023-08-29 DIAGNOSIS — R911 Solitary pulmonary nodule: Secondary | ICD-10-CM

## 2023-08-29 DIAGNOSIS — R2689 Other abnormalities of gait and mobility: Secondary | ICD-10-CM | POA: Diagnosis not present

## 2023-08-29 DIAGNOSIS — H6993 Unspecified Eustachian tube disorder, bilateral: Secondary | ICD-10-CM

## 2023-08-29 DIAGNOSIS — R059 Cough, unspecified: Secondary | ICD-10-CM | POA: Diagnosis not present

## 2023-08-29 DIAGNOSIS — Z85118 Personal history of other malignant neoplasm of bronchus and lung: Secondary | ICD-10-CM | POA: Diagnosis not present

## 2023-08-29 LAB — POC COVID19/FLU A&B COMBO
Covid Antigen, POC: NEGATIVE
Influenza A Antigen, POC: NEGATIVE
Influenza B Antigen, POC: NEGATIVE

## 2023-08-29 LAB — POCT RAPID STREP A (OFFICE): Rapid Strep A Screen: NEGATIVE

## 2023-08-29 LAB — POCT FASTING CBG KUC MANUAL ENTRY: POCT Glucose (KUC): 108 mg/dL — AB (ref 70–99)

## 2023-08-29 MED ORDER — BENZONATATE 100 MG PO CAPS: 100.0000 mg | ORAL_CAPSULE | Freq: Three times a day (TID) | ORAL | 0 refills | Status: DC | PRN

## 2023-08-29 MED ORDER — METHYLPREDNISOLONE ACETATE 40 MG/ML IJ SUSP
40.0000 mg | Freq: Once | INTRAMUSCULAR | Status: AC
  Administered 2023-08-29: 40 mg via INTRAMUSCULAR

## 2023-08-29 MED ORDER — PREDNISONE 20 MG PO TABS
40.0000 mg | ORAL_TABLET | Freq: Every day | ORAL | 0 refills | Status: AC
Start: 2023-08-29 — End: 2023-09-03

## 2023-08-29 NOTE — Telephone Encounter (Signed)
Called patient to discuss chest x-ray results.  Identified patient using 2 patient identifiers.  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  No change to treatment plan from urgent care standpoint at this time, recommended close follow-up with oncologist sooner than currently scheduled appointment.  Will route chest x-ray results to Dr. Shirline Frees.

## 2023-08-29 NOTE — Discharge Instructions (Addendum)
I will contact you later today if the chest x-ray shows that there is pneumonia.  We gave you an injection of steroid medicine to help with the lung inflammation.  In the meantime, start taking prednisone starting tomorrow morning to help with the fluid behind your ears and the lung inflammation.  Start the cough Perles as needed for the dry cough.  Continue plenty of hydration.  You tested negative for COVID-19, influenza, and strep throat today.  Regarding the problems with your balance, the EKG today is stable.  Your blood sugar is also stable and your vital signs are all stable.  I would recommend following up with your primary care provider if symptoms do not improve after your breathing has stabilized.  If symptoms worsen in the meantime, seek emergent care.

## 2023-08-29 NOTE — ED Triage Notes (Signed)
Bilateral ear pain, sore throat, cough, headache since Sunday.  States balance is off when getting up.

## 2023-08-29 NOTE — ED Provider Notes (Signed)
[facility]    CSN: [id number] Arrival date & time: 08/29/23  1016      History   Chief Complaint Chief Complaint  Patient presents with   ear pain, sore throat, cough    HPI Micheal Baker is a 77 y.o. male.   Patient presents today for 2-day history of bodyaches, chills, congested and dry cough, stuffy nose, sore throat, headache, bilateral ear pain without drainage, decreased hearing out of both ears, and fatigue.  He denies known fevers, shortness of breath or chest pain, runny nose, abdominal pain, nausea/vomiting, diarrhea, and change in appetite.  Has been drinking warm liquids like soup, taking cough suppressant medication without much improvement.  Also reports "balance issues" for the past month off and on.  Reports he tries to do exercises at the gym to help with his balance.  He denies dizziness, lightheadedness, or room spinning sensation.  Denies trouble with his speech or trouble swallowing, no issues with memory.  No headache, aural fullness, recent fall or head injury, changes to vision.  Reports he did fall last month but tripped over something and did not hit his head or lose consciousness.  Patient reports history of lung cancer to the left lobe, had lobectomy 3 months ago and follows up with oncologist in 3 more months.  Patient denies history of or currently smoking or vaping.  Drinks approximately 120 ounces of water daily.    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   S/P lobectomy of  lung 03/22/2023   Amnesia 02/08/2023   Lung nodule 01/19/2023   Hypocalcemia 12/26/2022   GERD (gastroesophageal reflux disease) 12/25/2022   Anxiety and depression 12/25/2022   Sepsis due to urinary tract infection (HCC) 12/24/2022   Constipation 10/27/2022   Change in stool caliber 10/27/2022   Pain of upper abdomen 10/27/2022   Left leg pain 09/21/2022   Urinary tract infection associated with indwelling urethral catheter (HCC)    Severe sepsis (HCC) 01/30/2022   BPH (benign prostatic hyperplasia) 01/30/2022   Hypokalemia 01/30/2022   Acute lateral meniscus tear of left knee 06/27/2014   Acute medial meniscus tear of left knee    NSVT (nonsustained ventricular tachycardia) (HCC)    Bradycardia 06/10/2014   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   Cough 10/03/2012   Non-cardiac chest pain 08/08/2011   Essential hypertension 08/08/2011   Hypothyroidism 08/08/2011    Past Surgical History:  Procedure Laterality Date   BIOPSY  12/01/2022   Procedure: BIOPSY;  Surgeon: Lanelle Bal, DO;  [facility]: [facility];  Service: Endoscopy;;   BRONCHIAL BIOPSY  02/07/2023   Procedure: BRONCHIAL BIOPSIES;  Surgeon: Josephine Igo, DO;  Location: [facility];  Service: Pulmonary;;   BRONCHIAL NEEDLE ASPIRATION BIOPSY  02/07/2023   Procedure: BRONCHIAL NEEDLE ASPIRATION BIOPSIES;  Surgeon: Josephine Igo, DO;  Location: [facility];  Service: Pulmonary;;   CATARACT EXTRACTION  left   COLONOSCOPY N/A 04/08/2016   Surgeon: West Bali, MD; one 6 mm polyp in the cecum removed, one 4 mm polyp in the descending colon removed, nonbleeding internal hemorrhoids.  Pathology with hyperplastic polyps.  Recommended colonoscopy in 10 years.   COLONOSCOPY  12/01/2022   COLONOSCOPY WITH PROPOFOL N/A 12/01/2022   Procedure: COLONOSCOPY WITH PROPOFOL;  Surgeon: Lanelle Bal, DO;  [facility]: [facility];  Service: Endoscopy;  Laterality: N/A;  8:15 am, asa 3   endosocopy  12/01/2022    ESOPHAGOGASTRODUODENOSCOPY (EGD) WITH PROPOFOL N/A 12/01/2022   Procedure: ESOPHAGOGASTRODUODENOSCOPY (EGD) WITH PROPOFOL;  Surgeon: Lanelle Bal, DO;  [facility];  Service: Endoscopy;  Laterality: N/A;   EYE SURGERY     left-scar tissue   FIDUCIAL MARKER PLACEMENT  02/07/2023   Procedure: FIDUCIAL MARKER PLACEMENT;  Surgeon: Josephine Igo, DO;  Location: [facility];  Service: Pulmonary;;   groin surgery Right 2010   growth removed    KNEE ARTHROSCOPY     left   KNEE ARTHROSCOPY WITH LATERAL MENISECTOMY Right 03/11/2015   Procedure: RIGHT KNEE ARTHROSCOPY WITH MEDIALAND LATERAL MENISECTOMY;  Surgeon: Salvatore Marvel, MD;  Location: [facility];  Service: Orthopedics;  Laterality: Right;   KNEE ARTHROSCOPY WITH MEDIAL MENISECTOMY Left 06/27/2014   Procedure: LEFT KNEE ARTHROSCOPY WITH PARTIAL MEDIAL AND LATERAL MENISECTOMIES AND CHONDROPLASTY;  Surgeon: Nilda Simmer, MD;  Location: [facility];  Service: Orthopedics;  Laterality: Left;   KNEE ARTHROSCOPY WITH MEDIAL MENISECTOMY Right 03/11/2015   Procedure: KNEE ARTHROSCOPY WITH MEDIAL MENISECTOMY;  Surgeon: Salvatore Marvel, MD;  Location: [facility];  Service: Orthopedics;  Laterality: Right;   LEFT HEART CATHETERIZATION WITH CORONARY ANGIOGRAM N/A 09/14/2011   Procedure: LEFT HEART CATHETERIZATION WITH CORONARY ANGIOGRAM;  Surgeon: [name], MD;  Location: [facility];  Service: Cardiovascular;  Laterality: N/A;   LYMPH NODE DISSECTION Left 03/22/2023   Procedure: LYMPH NODE DISSECTION;  Surgeon: Corliss Skains, MD;  Location: [facility];  Service: Thoracic;  Laterality: Left;   POLYPECTOMY  04/08/2016   Procedure: POLYPECTOMY;  Surgeon: West Bali, MD;  [facility];  Service: Endoscopy;;  colon    POLYPECTOMY  12/01/2022   POLYPECTOMY  12/01/2022   Procedure: POLYPECTOMY;  Surgeon: Lanelle Bal, DO;  [facility];  Service: Endoscopy;;    RETINAL DETACHMENT SURGERY Left 2002   Dr. Hermina Barters       Home Medications    Prior to Admission medications   Medication Sig Start Date End Date Taking? Authorizing Provider  benzonatate (TESSALON) 100 MG capsule Take 1 capsule (100 mg total) by mouth 3 (three) times daily as needed for cough. Do not take with alcohol or while driving or operating heavy machinery.  May cause drowsiness. 08/29/23  Yes Valentino Nose, NP  predniSONE (DELTASONE) 20 MG tablet Take 2 tablets (40 mg total) by mouth daily with breakfast for 5 days. 08/29/23 09/03/23 Yes Valentino Nose, NP  acetaminophen (TYLENOL) 500 MG tablet Take 500-1,000 mg by mouth every 6 (six) hours as needed for mild pain, headache or moderate pain.    [provider]  ALPHA LIPOIC ACID PO Take 1 capsule by mouth in the morning.    [provider]  B Complex-C (B-COMPLEX WITH VITAMIN C) tablet Take 1 tablet by mouth in the morning.    [provider]  buPROPion (WELLBUTRIN XL) 150 MG 24 hr tablet Take 150  mg by mouth daily. 02/01/23   [provider]  calcium carbonate (TUMS - DOSED IN MG ELEMENTAL CALCIUM) 500 MG chewable tablet Chew 1 tablet (200 mg of elemental calcium total) by mouth 3 (three) times daily with meals. Patient taking differently: Chew 1 tablet by mouth 3 (three) times daily as needed for indigestion or heartburn. 12/28/22   Shon Hale, MD  diclofenac Sodium (VOLTAREN) 1 % GEL Apply 1 Application topically 4 (four) times daily as needed (knee pain).    [provider]  doxylamine, Sleep, (UNISOM) 25 MG tablet Take 25 mg by mouth at bedtime as needed for sleep.    [provider]  escitalopram (LEXAPRO) 20 MG tablet Take 20 mg by mouth daily.    [provider]  finasteride (PROSCAR) 5 MG tablet Take 1 tablet (5 mg total) by mouth daily. 04/07/23   McKenzie, Mardene Celeste, MD  fluticasone (FLONASE) 50 MCG/ACT nasal spray Place 1 spray into both nostrils  daily as needed for allergies. 09/20/22   [provider]  levothyroxine (SYNTHROID) 88 MCG tablet Take 1 tablet (88 mcg total) by mouth daily before breakfast. 02/09/23 02/09/24  Shon Hale, MD  pantoprazole (PROTONIX) 40 MG tablet TAKE ONE TABLET BY MOUTH ONCE DAILY. 07/24/23   Aida Raider, NP  Propylene Glycol (SYSTANE COMPLETE) 0.6 % SOLN Place 1 drop into both eyes as needed (dry eyes).    [provider]  silodosin (RAPAFLO) 8 MG CAPS capsule Take 1 capsule (8 mg total) by mouth 2 (two) times daily. 04/07/23   McKenzie, Mardene Celeste, MD    Family History Family History  Problem Relation Age of Onset   Hypertension Mother    Hypertension Father    Heart attack Father    Diabetes Father    Asthma Daughter    Colon cancer Neg Hx    Stomach cancer Neg Hx     Social History Social History   Tobacco Use   Smoking status: Never   Smokeless tobacco: Never   Tobacco comments:    06/10/14 1968- Tried it once and didn't like it- AJ  Vaping Use   Vaping status: Never Used  Substance Use Topics   Alcohol use: Yes    Alcohol/week: 0.0 standard drinks of alcohol    Comment: occasionally   Drug use: No     Allergies   Avelox [moxifloxacin] and Penicillins   Review of Systems Review of Systems Per HPI  Physical Exam Triage Vital Signs ED Triage Vitals  Encounter Vitals Group     BP 08/29/23 1038 115/83     Systolic BP Percentile --      Diastolic BP Percentile --      Pulse Rate 08/29/23 1038 98     Resp 08/29/23 1038 20     Temp 08/29/23 1038 98.7 F (37.1 C)     Temp Source 08/29/23 1038 Oral     SpO2 08/29/23 1038 95 %     Weight --      Height --      Head Circumference --      Peak Flow --      Pain Score 08/29/23 1040 4     Pain Loc --      Pain Education --      Exclude from Growth Chart --    Orthostatic VS for the past 24 hrs:  BP- Lying Pulse- Lying BP- Sitting Pulse- Sitting BP- Standing at 0 minutes Pulse- Standing at 0 minutes  08/29/23 1115 132/89 79 139/82 78 116/83 95    Updated Vital Signs BP 115/83 (BP Location: Right Arm)   Pulse 98   Temp 98.7 F (37.1 C) (Oral)   Resp 20   SpO2 95%   Visual Acuity Right Eye Distance:   Left Eye Distance:   Bilateral Distance:    Right Eye Near:   Left Eye Near:    Bilateral Near:     Physical Exam Vitals and nursing note reviewed.  Constitutional:      General: He is not in acute distress.    Appearance: Normal appearance. He is not ill-appearing or toxic-appearing.  HENT:     Head: Normocephalic and atraumatic.     Right Ear: Ear canal and external ear normal. A middle ear effusion is present.     Left Ear: Ear canal and external ear normal. A middle ear effusion is present.     Nose: Congestion present. No rhinorrhea.     Mouth/Throat:     Mouth: Mucous membranes are moist.     Pharynx: Oropharynx is clear. Posterior oropharyngeal erythema present. No oropharyngeal exudate.  Eyes:     General: No scleral icterus.    Extraocular Movements: Extraocular movements intact.  Cardiovascular:     Rate and Rhythm: Normal rate and regular rhythm.  Pulmonary:     Effort: Pulmonary effort is normal. No respiratory distress.     Breath sounds: Wheezing present. No rhonchi or rales.  Musculoskeletal:     Cervical back: Normal range of motion and neck supple.  Lymphadenopathy:     Cervical: No cervical adenopathy.  Skin:    General: Skin is warm and dry.     Coloration: Skin is not jaundiced or pale.     Findings: No erythema or rash.  Neurological:     General: No focal deficit present.     Mental Status: He is alert and oriented to person, place, and time.     Motor: No weakness.     Coordination: Coordination normal.     Gait: Gait normal.  Psychiatric:        Behavior: Behavior is cooperative.      UC Treatments / Results  Labs (all labs ordered are listed, but only abnormal results are displayed) Labs Reviewed  POCT FASTING CBG [facility] MANUAL  ENTRY - Abnormal; Notable for the following components:      Result Value   POCT Glucose ([facility]) 108 (*)    All other components within normal limits  POCT RAPID STREP A (OFFICE)  POC COVID19/FLU A&B COMBO    EKG   Radiology DG Chest 2 View Result Date: 08/29/2023 CLINICAL DATA:  Cough.  History of lung cancer. EXAM: CHEST - 2 VIEW COMPARISON:  05/23/2023 FINDINGS: Volume loss left hemithorax. Calcified mediastinal and hilar lymph nodes evident, as demonstrated on CT scan 05/30/2023. Calcified granuloma noted right lung apex and left mid lung. New nodular opacity is identified over the lateral right upper lung. No focal consolidation, pulmonary edema, or pleural effusion. The cardiopericardial silhouette is within normal limits for size. No acute bony abnormality. IMPRESSION: 1. New nodular density right upper lung. Chest CT without contrast recommended to further evaluate. 2. Evidence for prior granulomatous disease. These results will be called to the ordering clinician or representative by the Radiologist Assistant, and communication documented in the PACS or [employer]. Electronically Signed   By: Kennith Center M.D.   On: 08/29/2023 14:05    Procedures Procedures (including critical care  time)  Medications Ordered in UC Medications  methylPREDNISolone acetate (DEPO-MEDROL) injection 40 mg (40 mg Intramuscular Given 08/29/23 1148)    Initial Impression / Assessment and Plan / UC Course  I have reviewed the triage vital signs and the nursing notes.  Pertinent labs & imaging results that were available during my care of the patient were reviewed by me and considered in my medical decision making (see chart for details).   Patient is well-appearing, normotensive, afebrile, not tachycardic, not tachypneic, oxygenating well on room air.    1. Viral URI with cough 2. Dysfunction of both eustachian tubes 3. Wheezing 4. Lung nodule Overall, vitals and exam are stable Possible  viral etiology Rapid strep throat test negative, COVID-19 and flu test negative Given expiratory wheezing, we treated patient with Depo-Medrol injection today and start oral prednisone tomorrow Chest x-ray obtained to rule out pneumonia Supportive care discussed, start cough suppressant medication, continue plenty of hydration   5. Balance problem EKG today shows sinus rhythm with occasional PVCs and PACs, comparable with EKG when compared to KG from May 2024 No significant ST or T wave abnormalities when compared with previous EKGs Orthostatic vital signs are largely negative, no significant hypotension or tachycardia He is neurologically intact today Nonfasting blood sugar is not low Recommended close follow-up with PCP if symptoms do not improve with treatment after acute illness  Addendum: Called patient and discussed chest x-ray results.  Chest x-ray shows new lung nodule, no pneumonia.  Recommended close follow-up with oncology.  I also forwarded chest x-ray results to oncologist.  Patient verbalized understanding and all questions answered to the best of my ability.   The patient was given the opportunity to ask questions.  All questions answered to their satisfaction.  The patient is in agreement to this plan.   Final Clinical Impressions(s) / UC Diagnoses   Final diagnoses:  Viral URI with cough  Balance problem  Dysfunction of both eustachian tubes  Wheezing  Lung nodule     Discharge Instructions      I will contact you later today if the chest x-ray shows that there is pneumonia.  We gave you an injection of steroid medicine to help with the lung inflammation.  In the meantime, start taking prednisone starting tomorrow morning to help with the fluid behind your ears and the lung inflammation.  Start the cough Perles as needed for the dry cough.  Continue plenty of hydration.  You tested negative for COVID-19, influenza, and strep throat today.  Regarding the problems  with your balance, the EKG today is stable.  Your blood sugar is also stable and your vital signs are all stable.  I would recommend following up with your primary care provider if symptoms do not improve after your breathing has stabilized.  If symptoms worsen in the meantime, seek emergent care.     ED Prescriptions     Medication Sig Dispense Auth. Provider   predniSONE (DELTASONE) 20 MG tablet Take 2 tablets (40 mg total) by mouth daily with breakfast for 5 days. 10 tablet Cathlean Marseilles A, NP   benzonatate (TESSALON) 100 MG capsule Take 1 capsule (100 mg total) by mouth 3 (three) times daily as needed for cough. Do not take with alcohol or while driving or operating heavy machinery.  May cause drowsiness. 21 capsule Valentino Nose, NP      PDMP not reviewed this encounter.   Valentino Nose, NP 08/29/23 [id number]

## 2023-08-30 ENCOUNTER — Ambulatory Visit: Payer: Medicare PPO | Admitting: Pulmonary Disease

## 2023-08-31 ENCOUNTER — Encounter: Payer: Self-pay | Admitting: *Deleted

## 2023-08-31 ENCOUNTER — Telehealth: Payer: Self-pay | Admitting: *Deleted

## 2023-08-31 ENCOUNTER — Telehealth: Payer: Self-pay

## 2023-08-31 ENCOUNTER — Ambulatory Visit: Payer: Self-pay | Admitting: *Deleted

## 2023-08-31 DIAGNOSIS — I10 Essential (primary) hypertension: Secondary | ICD-10-CM

## 2023-08-31 NOTE — Patient Outreach (Signed)
Care Coordination   Follow Up Visit Note   08/31/2023 Name: Micheal Baker MRN: [id number] DOB: May 08, 1946  Micheal Baker is a 77 y.o. year old male who sees Micheal Found, MD for primary care. I spoke with  [name] by phone today.  What matters to the patients health and wellness today?  Resolving cough due to viral infection and following up on new right lung nodule.   Goals Addressed             This Visit's Progress    Resolution of Cough due to Viral Illness       Care Management Goals: Patient will take prednisone and tessalon Perles as prescribed Patient will follow-up with PCP within the next few days if his symptoms persist or worsen Patient will seek medical attention for any S/S of infection or emergency Patient will use a warm mist humidifier by his bed at night Patient can try warm liquids, like tea to see if it will ease his cough Patient can try honey either in his tea/drink or by taking a spoonful by mouth  Patient will follow-up with oncologist as scheduled for new right upper lobe nodule that was seen on chest xray at urgent care visit  Patient will reach out to RN Care Manager at [phone] with any care management needs          SDOH assessments and interventions completed:  Yes SDOH Interventions Today    Flowsheet Row Most Recent Value  SDOH Interventions   Transportation Interventions Intervention Not Indicated  Health Literacy Interventions Intervention Not Indicated         Care Coordination Interventions:  Yes, provided  Interventions Today    Flowsheet Row Most Recent Value  Chronic Disease   Chronic disease during today's visit Other  [lung cancer, current viral illness with cough]  General Interventions   General Interventions Discussed/Reviewed General Interventions Discussed, General Interventions Reviewed, Durable Medical Equipment (DME), Labs, Doctor Visits  [diagnosed with viral infection with a cough. Cough was worse last  night and affected sleep. It is better today but about the same as it was during the day yesterday.]  Labs --  [reviewed labs done at urgent care visit. Negative for Strep, covid, and flu]  Doctor Visits Discussed/Reviewed Doctor Visits Discussed, Doctor Visits Reviewed, Specialist, PCP  [reviewed urgent care visit notes, lab results, and chest xray results]  Durable Medical Equipment (DME) Bed side commode  PCP/Specialist Visits Compliance with follow-up visit  [oncologist on 12/31 to follow-up on new lung nodule. Pulmonary in February for routine F/U. Follow-up with PCP or seek other medical attention for new or worsening symptoms]  Exercise Interventions   Exercise Discussed/Reviewed Physical Activity  Physical Activity Discussed/Reviewed Physical Activity Reviewed, Physical Activity Discussed  [able to perform ADLs]  Education Interventions   Education Provided Provided Education  Provided Verbal Education On Nutrition, Labs, When to see the doctor, Medication  [warm mist humidifer by bed at night. Can drink warm liquids, like tea. Get plenty of fluids. Encouraged to cough of sputum. Honey may help with cough. Can put it in his drink or take a spoonful by mouth.]  Labs Reviewed --  [discussed negative results of flu, covid, and strep test at urgent care. Discussed result of chest xray that showed a new right upper lobe lung nodule.]  Nutrition Interventions   Nutrition Discussed/Reviewed Nutrition Discussed, Nutrition Reviewed, Fluid intake  Pharmacy Interventions   Pharmacy Dicussed/Reviewed Pharmacy Topics Discussed, Pharmacy Topics Reviewed, Medications  and their functions  Jerilynn Som aren't relieving cough. Has taken Delsym and that didn't realy help either. Had steroid injection at urgent car visit and is taking oral predisone as prescribed.]       Follow up plan: Follow up call scheduled for 09/04/23    Encounter Outcome:  Patient Visit Completed   [name], RN, BSN Care  Manager [place]  [facility]  Population Health  Direct Dial: [phone] Main #: [phone]

## 2023-08-31 NOTE — Progress Notes (Signed)
Complex Care Management Note   08/31/2023 Name: Micheal Baker MRN: [id number] DOB: [birth year]  Micheal Baker is a 77 y.o. year old male who sees [name], MD for primary care. I reached out to [name] by phone today to offer complex care management services.  Micheal Baker was given information about Complex Care Management services today including:   The Complex Care Management services include support from the care team which includes your Nurse Coordinator, Clinical Social Worker, or Pharmacist.  The Complex Care Management team is here to help remove barriers to the health concerns and goals most important to you. Complex Care Management services are voluntary, and the patient may decline or stop services at any time by request to their care team member.   Complex Care Management Consent Status: Patient agreed to services and verbal consent obtained.   Follow up plan:  Telephone appointment with complex care management team member scheduled for:  08/31/23  Encounter Outcome:  Patient Scheduled  [facility] Coordination Care Guide  Direct Dial: [phone]

## 2023-09-01 DIAGNOSIS — A493 Mycoplasma infection, unspecified site: Secondary | ICD-10-CM | POA: Diagnosis not present

## 2023-09-01 DIAGNOSIS — Z6825 Body mass index (BMI) 25.0-25.9, adult: Secondary | ICD-10-CM | POA: Diagnosis not present

## 2023-09-04 ENCOUNTER — Ambulatory Visit
Admission: EM | Admit: 2023-09-04 | Discharge: 2023-09-04 | Disposition: A | Discharge status: Home / Self Care | Payer: Medicare PPO | Attending: Family Medicine | Admitting: Family Medicine

## 2023-09-04 ENCOUNTER — Encounter: Payer: Self-pay | Admitting: *Deleted

## 2023-09-04 ENCOUNTER — Ambulatory Visit: Payer: Self-pay | Admitting: *Deleted

## 2023-09-04 DIAGNOSIS — H1033 Unspecified acute conjunctivitis, bilateral: Secondary | ICD-10-CM

## 2023-09-04 MED ORDER — POLYMYXIN B-TRIMETHOPRIM 10000-0.1 UNIT/ML-% OP SOLN: 1.0000 [drp] | Freq: Four times a day (QID) | OPHTHALMIC | 0 refills | Status: DC

## 2023-09-04 NOTE — ED Triage Notes (Signed)
Burning red eyes, that itch and watering, now having yellow crusting over eyes x 3 days.

## 2023-09-04 NOTE — ED Provider Notes (Signed)
[facility]    CSN: [id number] Arrival date & time: 09/04/23  3664      History   Chief Complaint Chief Complaint  Patient presents with   Conjunctivitis    HPI Micheal Baker is a 77 y.o. male.   Presenting today with 3-day history of red itchy and irritated eyes with drainage, yellow crusting.  Denies injury to the eye, new products use, loss of vision, headache, nausea, vomiting, fever.  So far not tried anything over-the-counter for symptoms.    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/2023   Amnesia 02/08/2023   Lung nodule 01/19/2023   Hypocalcemia 12/26/2022   GERD (gastroesophageal reflux disease) 12/25/2022   Anxiety and depression 12/25/2022   Sepsis due to urinary tract infection (HCC) 12/24/2022   Constipation 10/27/2022   Change in stool caliber 10/27/2022   Pain of upper abdomen 10/27/2022   Left leg pain 09/21/2022   Urinary tract infection associated with indwelling urethral catheter (HCC)    Severe sepsis (HCC) 01/30/2022   BPH (benign prostatic hyperplasia) 01/30/2022   Hypokalemia 01/30/2022   Acute lateral meniscus tear of left knee 06/27/2014   Acute medial meniscus tear of left knee    NSVT (nonsustained ventricular tachycardia) (HCC)    Bradycardia 06/10/2014   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   Cough 10/03/2012   Non-cardiac chest pain 08/08/2011   Essential hypertension 08/08/2011   Hypothyroidism 08/08/2011    Past Surgical History:   Procedure Laterality Date   BIOPSY  12/01/2022   Procedure: BIOPSY;  Surgeon: Lanelle Bal, DO;  Location: [facility];  Service: Endoscopy;;   BRONCHIAL BIOPSY  02/07/2023   Procedure: BRONCHIAL BIOPSIES;  Surgeon: Josephine Igo, DO;  Location: [facility];  Service: Pulmonary;;   BRONCHIAL NEEDLE ASPIRATION BIOPSY  02/07/2023   Procedure: BRONCHIAL NEEDLE ASPIRATION BIOPSIES;  Surgeon: Josephine Igo, DO;  Location: [facility];  Service: Pulmonary;;   CATARACT EXTRACTION     left   COLONOSCOPY N/A 04/08/2016   Surgeon: West Bali, MD; one 6 mm polyp in the cecum removed, one 4 mm polyp in the descending colon removed, nonbleeding internal hemorrhoids.  Pathology with hyperplastic polyps.  Recommended colonoscopy in 10 years.   COLONOSCOPY  12/01/2022   COLONOSCOPY WITH PROPOFOL N/A 12/01/2022   Procedure: COLONOSCOPY WITH PROPOFOL;  Surgeon: Lanelle Bal, DO;  Location: [facility];  Service: Endoscopy;  Laterality: N/A;  8:15 am, asa 3   endosocopy  12/01/2022   ESOPHAGOGASTRODUODENOSCOPY (EGD) WITH PROPOFOL N/A 12/01/2022   Procedure: ESOPHAGOGASTRODUODENOSCOPY (EGD) WITH PROPOFOL;  Surgeon: Lanelle Bal, DO;  Location: [facility];  Service: Endoscopy;  Laterality: N/A;   EYE SURGERY     left-scar tissue   FIDUCIAL MARKER PLACEMENT  02/07/2023   Procedure: FIDUCIAL MARKER PLACEMENT;  Surgeon: Josephine Igo, DO;  Location: [facility];  Service: Pulmonary;;   groin surgery Right 2010   growth removed    KNEE ARTHROSCOPY     left   KNEE ARTHROSCOPY WITH LATERAL MENISECTOMY Right 03/11/2015   Procedure: RIGHT KNEE ARTHROSCOPY WITH MEDIALAND LATERAL MENISECTOMY;  Surgeon: Salvatore Marvel, MD;  Location: [facility];  Service: Orthopedics;  Laterality: Right;   KNEE ARTHROSCOPY WITH MEDIAL MENISECTOMY Left 06/27/2014   Procedure: LEFT KNEE ARTHROSCOPY WITH PARTIAL MEDIAL AND LATERAL MENISECTOMIES AND CHONDROPLASTY;  Surgeon: Nilda Simmer,  MD;  Location: [facility];  Service: Orthopedics;  Laterality: Left;   KNEE ARTHROSCOPY WITH MEDIAL MENISECTOMY Right 03/11/2015   Procedure: KNEE ARTHROSCOPY WITH MEDIAL MENISECTOMY;  Surgeon: Salvatore Marvel, MD;  Location: [facility];  Service: Orthopedics;  Laterality: Right;   LEFT HEART CATHETERIZATION WITH CORONARY ANGIOGRAM N/A 09/14/2011   Procedure: LEFT HEART CATHETERIZATION WITH CORONARY ANGIOGRAM;  Surgeon: [name], MD;  Location: [facility];  Service: Cardiovascular;  Laterality: N/A;   LYMPH NODE DISSECTION Left 03/22/2023   Procedure: LYMPH NODE DISSECTION;  Surgeon: Corliss Skains, MD;  Location: [facility];  Service: Thoracic;  Laterality: Left;   POLYPECTOMY  04/08/2016   Procedure: POLYPECTOMY;  Surgeon: West Bali, MD;  Location: [facility];  Service: Endoscopy;;  colon    POLYPECTOMY  12/01/2022   POLYPECTOMY  12/01/2022   Procedure: POLYPECTOMY;  Surgeon: Lanelle Bal, DO;  Location: [facility];  Service: Endoscopy;;   RETINAL DETACHMENT SURGERY Left 2002   Dr. Hermina Barters       Home Medications    Prior to Admission medications   Medication Sig Start Date End Date Taking? Authorizing Provider  ALPHA LIPOIC ACID PO Take 1 capsule by mouth in the morning.   Yes [provider]  B Complex-C (B-COMPLEX WITH VITAMIN C) tablet Take 1 tablet by mouth in the morning.   Yes [provider]  buPROPion (WELLBUTRIN XL) 150 MG 24 hr tablet Take 150 mg by mouth daily. 02/01/23  Yes [provider]  diclofenac Sodium (VOLTAREN) 1 % GEL Apply 1 Application topically 4 (four) times daily as needed (knee pain).   Yes [provider]  doxylamine, Sleep, (UNISOM) 25 MG tablet Take 25 mg by mouth at bedtime as needed for sleep.   Yes [provider]  escitalopram (LEXAPRO) 20 MG tablet Take 20 mg by mouth daily.   Yes [provider]  finasteride (PROSCAR) 5 MG tablet Take 1 tablet  (5 mg total) by mouth daily. 04/07/23  Yes McKenzie, Mardene Celeste, MD  levothyroxine (SYNTHROID) 88 MCG tablet Take 1 tablet (88 mcg total) by mouth daily before breakfast. 02/09/23 02/09/24 Yes Emokpae, Courage, MD  pantoprazole (PROTONIX) 40 MG tablet TAKE ONE TABLET BY MOUTH ONCE DAILY. 07/24/23  Yes Mahon, Frederik Schmidt, NP  Propylene Glycol (SYSTANE COMPLETE) 0.6 % SOLN Place 1 drop into both eyes as needed (dry eyes).   Yes [provider]  silodosin (RAPAFLO) 8 MG CAPS capsule Take 1 capsule (8 mg total) by mouth 2 (two) times daily. 04/07/23  Yes McKenzie, Mardene Celeste, MD  trimethoprim-polymyxin b (POLYTRIM) ophthalmic solution Place 1 drop into both eyes every 6 (six) hours. 09/04/23  Yes Particia Nearing, PA-C  acetaminophen (TYLENOL) 500 MG tablet Take 500-1,000 mg by mouth every 6 (six) hours as needed for mild pain, headache or moderate pain.    [provider]  benzonatate (TESSALON) 100 MG capsule Take 1 capsule (100 mg total)  by mouth 3 (three) times daily as needed for cough. Do not take with alcohol or while driving or operating heavy machinery.  May cause drowsiness. 08/29/23   Valentino Nose, NP  calcium carbonate (TUMS - DOSED IN MG ELEMENTAL CALCIUM) 500 MG chewable tablet Chew 1 tablet (200 mg of elemental calcium total) by mouth 3 (three) times daily with meals. Patient taking differently: Chew 1 tablet by mouth 3 (three) times daily as needed for indigestion or heartburn. 12/28/22   Shon Hale, MD  fluticasone (FLONASE) 50 MCG/ACT nasal spray Place 1 spray into both nostrils daily as needed for allergies. 09/20/22   [provider]    Family History Family History  Problem Relation Age of Onset   Hypertension Mother    Hypertension Father    Heart attack Father    Diabetes Father    Asthma Daughter    Colon cancer Neg Hx    Stomach cancer Neg Hx     Social History Social History   Tobacco Use   Smoking status: Never   Smokeless  tobacco: Never   Tobacco comments:    06/10/14 1968- Tried it once and didn't like it- AJ  Vaping Use   Vaping status: Never Used  Substance Use Topics   Alcohol use: Yes    Alcohol/week: 0.0 standard drinks of alcohol    Comment: occasionally   Drug use: No     Allergies   Avelox [moxifloxacin] and Penicillins   Review of Systems Review of Systems Per HPI  Physical Exam Triage Vital Signs ED Triage Vitals [09/04/23 0926]  Encounter Vitals Group     BP 128/85     Systolic BP Percentile      Diastolic BP Percentile      Pulse Rate 94     Resp 16     Temp 97.9 F (36.6 C)     Temp Source Oral     SpO2 96 %     Weight      Height      Head Circumference      Peak Flow      Pain Score 5     Pain Loc      Pain Education      Exclude from Growth Chart    No data found.  Updated Vital Signs BP 128/85 (BP Location: Right Arm)   Pulse 94   Temp 97.9 F (36.6 C) (Oral)   Resp 16   SpO2 96%   Visual Acuity Right Eye Distance:   Left Eye Distance:   Bilateral Distance:    Right Eye Near:   Left Eye Near:    Bilateral Near:     Physical Exam Vitals and nursing note reviewed.  Constitutional:      Appearance: Normal appearance.  HENT:     Head: Atraumatic.     Mouth/Throat:     Mouth: Mucous membranes are moist.  Eyes:     Extraocular Movements: Extraocular movements intact.     Pupils: Pupils are equal, round, and reactive to light.     Comments: Bilateral conjunctival erythema, yellow drainage and crusting  Cardiovascular:     Rate and Rhythm: Normal rate and regular rhythm.  Pulmonary:     Effort: Pulmonary effort is normal.     Breath sounds: Normal breath sounds.  Musculoskeletal:        General: Normal range of motion.     Cervical back: Normal range of motion and neck supple.  Skin:  General: Skin is warm and dry.  Neurological:     General: No focal deficit present.     Mental Status: He is oriented to person, place, and time.   Psychiatric:        Mood and Affect: Mood normal.        Thought Content: Thought content normal.        Judgment: Judgment normal.      UC Treatments / Results  Labs (all labs ordered are listed, but only abnormal results are displayed) Labs Reviewed - No data to display  EKG   Radiology No results found.  Procedures Procedures (including critical care time)  Medications Ordered in UC Medications - No data to display  Initial Impression / Assessment and Plan / UC Course  I have reviewed the triage vital signs and the nursing notes.  Pertinent labs & imaging results that were available during my care of the patient were reviewed by me and considered in my medical decision making (see chart for details).     Treat with Polytrim drops, cool compresses, good handwashing.  Take antihistamines in case seasonal allergy component.  Return for worsening symptoms.  Visual acuity declined as vision intact per patient.  Final Clinical Impressions(s) / UC Diagnoses   Final diagnoses:  Acute bacterial conjunctivitis of both eyes     Discharge Instructions      You can try taking antihistamines daily in case this is more of a seasonal allergy conjunctivitis.  I have sent over antibiotic drops and you may use lubricating drops, cool compresses, good handwashing additionally.  Follow-up for worsening symptoms.    ED Prescriptions     Medication Sig Dispense Auth. Provider   trimethoprim-polymyxin b (POLYTRIM) ophthalmic solution Place 1 drop into both eyes every 6 (six) hours. 10 mL Particia Nearing, [place]      PDMP not reviewed this encounter.   Roosvelt Maser [place], [place] 09/04/23 [phone]

## 2023-09-04 NOTE — Discharge Instructions (Signed)
You can try taking antihistamines daily in case this is more of a seasonal allergy conjunctivitis.  I have sent over antibiotic drops and you may use lubricating drops, cool compresses, good handwashing additionally.  Follow-up for worsening symptoms.

## 2023-09-04 NOTE — Patient Outreach (Signed)
  Care Coordination   Follow Up Visit Note   09/04/2023 Name: Micheal Baker MRN: [id number] DOB: 23-Sep-1945  Micheal Baker is a 77 y.o. year old male who sees Micheal Found, MD for primary care. I spoke with  Micheal Baker by phone today to follow-up on our conversation last week regarding persistent cough due to viral illness.   What matters to the patients health and wellness today?  Resolution of walking pneumonia and associated cough and bilateral conjunctivitis.    Goals Addressed             This Visit's Progress    Resolution of Conjunctivitis       Care Management Goals: Patient will use antibiotic eye drops in both eyes as directed Patient will practice good handwashing  Patient will follow-up with provider for any new or worsening symptoms Patient will reach out to RN Care Manager at [phone] with any care management needs     Resolution of Cough due to Viral Illness   On track    Care Management Goals: Patient will take prednisone, azithromycin, and Tussionex as prescribed Patient will follow-up with PCP or seek medical attention if symptoms persist or worsen Patient will remain active but rest as needed Patient will follow-up with oncologist as scheduled for new right upper lobe nodule that was seen on chest xray at urgent care visit  Patient will reach out to RN Care Manager at [phone] with any care management needs        SDOH assessments and interventions completed:  No     Care Coordination Interventions:  Yes, provided  Interventions Today    Flowsheet Row Most Recent Value  Chronic Disease   Chronic disease during today's visit --  [H/O lung cancer, lung nodule, "walking pneumonia", conjunctivitis]  General Interventions   General Interventions Discussed/Reviewed General Interventions Discussed, General Interventions Reviewed, Doctor Visits  Doctor Visits Discussed/Reviewed Doctor Visits Discussed, Doctor Visits Reviewed, PCP, Specialist   [reviewed and discussed visit with Urgent Care this morning for bilateral conjunctivitis. Discussed vist with PCP on 09/01/23. Diagnosed with Walking Pneumonia. Previously diagnosed with viral illnes at ED on 08/29/23.]  PCP/Specialist Visits Compliance with follow-up visit  [oncologist on 09/12/23. Follow-up with PCP as recommended and sooner if necessary.]  Exercise Interventions   Exercise Discussed/Reviewed Physical Activity  Physical Activity Discussed/Reviewed Physical Activity Discussed, Physical Activity Reviewed  [name] Lento to perform ADLs]  Education Interventions   Education Provided Provided Education  Provided Verbal Education On When to see the doctor, Medication, Exercise  [follow-up with provider with any new, worsening, or persistent symptoms]  Nutrition Interventions   Nutrition Discussed/Reviewed Nutrition Discussed, Nutrition Reviewed, Fluid intake  Pharmacy Interventions   Pharmacy Dicussed/Reviewed Pharmacy Topics Discussed, Pharmacy Topics Reviewed, Medications and their functions  [Still taking prednisone. Prescribed Z-pak and Tussionex by PCP. Is not taking tessalon perles at this time.]       Follow up plan: Follow up call scheduled for 09/14/23    Encounter Outcome:  Patient Visit Completed   [name], RN, BSN Care Manager [place]  [facility]  Population Health  Direct Dial: [phone] Main #: [phone]

## 2023-09-12 ENCOUNTER — Inpatient Hospital Stay: Payer: Medicare PPO | Attending: Internal Medicine | Admitting: Internal Medicine

## 2023-09-12 VITALS — BP 150/92 | HR 84 | Temp 97.1°F | Resp 16 | Ht 77.0 in | Wt 207.7 lb

## 2023-09-12 DIAGNOSIS — C3432 Malignant neoplasm of lower lobe, left bronchus or lung: Secondary | ICD-10-CM | POA: Insufficient documentation

## 2023-09-12 DIAGNOSIS — C3490 Malignant neoplasm of unspecified part of unspecified bronchus or lung: Secondary | ICD-10-CM

## 2023-09-12 DIAGNOSIS — Z902 Acquired absence of lung [part of]: Secondary | ICD-10-CM | POA: Diagnosis not present

## 2023-09-12 MED ORDER — DOXYCYCLINE HYCLATE 100 MG PO TABS: 100.0000 mg | ORAL_TABLET | Freq: Two times a day (BID) | ORAL | 0 refills | Status: DC

## 2023-09-12 NOTE — Progress Notes (Signed)
 [facility] [phone]   [phone]  OFFICE PROGRESS NOTE  Micheal Rush, Micheal Baker [address] [place] [address]  DIAGNOSIS:  Stage IA (T1a, N0, M0) non-small cell lung cancer, adenocarcinoma presented with left lower lobe lung nodule diagnosed in July 2024.  PRIOR THERAPY: status post left lower lobectomy with lymph node sampling under the care of Dr. Shyrl on March 22, 2023 with tumor size of 1.0 cm with focal visceral pleural invasion.   CURRENT THERAPY: Observation  INTERVAL HISTORY: Micheal Baker 77 y.o. male returns to the clinic today for follow-up visit.Discussed the use of AI scribe software for clinical note transcription with the patient, who gave verbal consent to proceed.  History of Present Illness   Micheal Baker, a 77 year old patient with a history of stage 1A non-small cell lung cancer, underwent a left lower lobectomy in July 2024. Since then, he has been under observation with a satisfactory scan in September. Recently, he sought medical attention due to symptoms resembling a severe cold.  He reported to an urgent care facility due to inability to see his primary care physician. The attending physician at the urgent care ordered a chest x-ray, which revealed suspicious nodules in the right lung.  His symptoms included a persistent cough and congestion, which led to difficulty in speaking without triggering a coughing fit. He also reported discomfort in the chest area. Despite taking prescribed antibiotics and cough suppressants, the symptoms persisted. He completed the antibiotic course four days prior to the current consultation.  He also reported episodes of night sweats and chills. However, tests for flu, COVID, and strep throat returned negative results. His condition has been causing significant distress, affecting sleep and communication with family members.       MEDICAL HISTORY: Past Medical History:  Diagnosis  Date   Acute medial meniscus tear of left knee    Depression    Frequent PVCs    GERD (gastroesophageal reflux disease)    History of hiatal hernia    History of inguinal hernia    History of kidney stones    Hypertension    Hypothyroidism    Lung cancer (HCC)    NSVT (nonsustained ventricular tachycardia) (HCC)    Osteoarthritis    Sleep apnea    Thyroid  disease     ALLERGIES:  is allergic to avelox [moxifloxacin] and penicillins.  MEDICATIONS:  Current Outpatient Medications  Medication Sig Dispense Refill   acetaminophen  (TYLENOL ) 500 MG tablet Take 500-1,000 mg by mouth every 6 (six) hours as needed for mild pain, headache or moderate pain.     ALPHA LIPOIC ACID PO Take 1 capsule by mouth in the morning.     azithromycin (ZITHROMAX) 250 MG tablet Take 250 mg by mouth daily. 2 tablets for first day and 1 tablet the next 4 days     B Complex-C (B-COMPLEX WITH VITAMIN C) tablet Take 1 tablet by mouth in the morning.     benzonatate  (TESSALON ) 100 MG capsule Take 1 capsule (100 mg total) by mouth 3 (three) times daily as needed for cough. Do not take with alcohol  or while driving or operating heavy machinery.  May cause drowsiness. (Patient not taking: Reported on 09/04/2023) 21 capsule 0   buPROPion  (WELLBUTRIN  XL) 150 MG 24 hr tablet Take 150 mg by mouth daily.     calcium  carbonate (TUMS - DOSED IN MG ELEMENTAL CALCIUM ) 500 MG chewable tablet Chew 1 tablet (200 mg of elemental  calcium  total) by mouth 3 (three) times daily with meals. (Patient taking differently: Chew 1 tablet by mouth 3 (three) times daily as needed for indigestion or heartburn.) 90 tablet 4   chlorpheniramine-HYDROcodone  (TUSSIONEX) 10-8 MG/5ML Take 5 mLs by mouth every 12 (twelve) hours as needed for cough.     diclofenac Sodium (VOLTAREN) 1 % GEL Apply 1 Application topically 4 (four) times daily as needed (knee pain).     doxylamine, Sleep, (UNISOM) 25 MG tablet Take 25 mg by mouth at bedtime as needed for  sleep.     escitalopram  (LEXAPRO ) 20 MG tablet Take 20 mg by mouth daily.     finasteride  (PROSCAR ) 5 MG tablet Take 1 tablet (5 mg total) by mouth daily. 90 tablet 3   fluticasone  (FLONASE ) 50 MCG/ACT nasal spray Place 1 spray into both nostrils daily as needed for allergies.     levothyroxine  (SYNTHROID ) 88 MCG tablet Take 1 tablet (88 mcg total) by mouth daily before breakfast. 30 tablet 2   pantoprazole  (PROTONIX ) 40 MG tablet TAKE ONE TABLET BY MOUTH ONCE DAILY. 90 tablet 0   Propylene Glycol (SYSTANE COMPLETE) 0.6 % SOLN Place 1 drop into both eyes as needed (dry eyes).     silodosin  (RAPAFLO ) 8 MG CAPS capsule Take 1 capsule (8 mg total) by mouth 2 (two) times daily. 60 capsule 11   trimethoprim -polymyxin b  (POLYTRIM ) ophthalmic solution Place 1 drop into both eyes every 6 (six) hours. 10 mL 0   No current facility-administered medications for this visit.    SURGICAL HISTORY:  Past Surgical History:  Procedure Laterality Date   BIOPSY  12/01/2022   Procedure: BIOPSY;  Surgeon: Cindie Carlin POUR, DO;  Location: [facility];  Service: Endoscopy;;   BRONCHIAL BIOPSY  02/07/2023   Procedure: BRONCHIAL BIOPSIES;  Surgeon: Brenna Adine CROME, DO;  Location: [facility];  Service: Pulmonary;;   BRONCHIAL NEEDLE ASPIRATION BIOPSY  02/07/2023   Procedure: BRONCHIAL NEEDLE ASPIRATION BIOPSIES;  Surgeon: Brenna Adine CROME, DO;  Location: [facility];  Service: Pulmonary;;   CATARACT EXTRACTION     left   COLONOSCOPY N/A 04/08/2016   Surgeon: Margo CROME Haddock, Micheal Baker; one 6 mm polyp in the cecum removed, one 4 mm polyp in the descending colon removed, nonbleeding internal hemorrhoids.  Pathology with hyperplastic polyps.  Recommended colonoscopy in 10 years.   COLONOSCOPY  12/01/2022   COLONOSCOPY WITH PROPOFOL  N/A 12/01/2022   Procedure: COLONOSCOPY WITH PROPOFOL ;  Surgeon: Cindie Carlin POUR, DO;  [facility]: [facility];  Service: Endoscopy;  Laterality: N/A;  8:15 am, asa 3   endosocopy  12/01/2022    ESOPHAGOGASTRODUODENOSCOPY (EGD) WITH PROPOFOL  N/A 12/01/2022   Procedure: ESOPHAGOGASTRODUODENOSCOPY (EGD) WITH PROPOFOL ;  Surgeon: Cindie Carlin POUR, DO;  [facility]: [facility];  Service: Endoscopy;  Laterality: N/A;   EYE SURGERY     left-scar tissue   FIDUCIAL MARKER PLACEMENT  02/07/2023   Procedure: FIDUCIAL MARKER PLACEMENT;  Surgeon: Brenna Adine CROME, DO;  Location: [facility];  Service: Pulmonary;;   groin surgery Right 2010   growth removed    KNEE ARTHROSCOPY     left   KNEE ARTHROSCOPY WITH LATERAL MENISECTOMY Right 03/11/2015   Procedure: RIGHT KNEE ARTHROSCOPY WITH MEDIALAND LATERAL MENISECTOMY;  Surgeon: Lamar Millman, Micheal Baker;  Location: [facility];  Service: Orthopedics;  Laterality: Right;   KNEE ARTHROSCOPY WITH MEDIAL MENISECTOMY Left 06/27/2014   Procedure: LEFT KNEE ARTHROSCOPY WITH PARTIAL MEDIAL AND LATERAL MENISECTOMIES AND CHONDROPLASTY;  Surgeon: Lamar DELENA Millman,  Micheal Baker;  Location: [facility];  Service: Orthopedics;  Laterality: Left;   KNEE ARTHROSCOPY WITH MEDIAL MENISECTOMY Right 03/11/2015   Procedure: KNEE ARTHROSCOPY WITH MEDIAL MENISECTOMY;  Surgeon: Lamar Millman, Micheal Baker;  Location:  SURGERY CENTER;  Service: Orthopedics;  Laterality: Right;   LEFT HEART CATHETERIZATION WITH CORONARY ANGIOGRAM N/A 09/14/2011   Procedure: LEFT HEART CATHETERIZATION WITH CORONARY ANGIOGRAM;  Surgeon: Vinie KYM Maxcy, Micheal Baker;  Location: [facility];  Service: Cardiovascular;  Laterality: N/A;   LYMPH NODE DISSECTION Left 03/22/2023   Procedure: LYMPH NODE DISSECTION;  Surgeon: Shyrl Linnie KIDD, Micheal Baker;  Location: [facility];  Service: Thoracic;  Laterality: Left;   POLYPECTOMY  04/08/2016   Procedure: POLYPECTOMY;  Surgeon: Margo LITTIE Haddock, Micheal Baker;  [facility];  Service: Endoscopy;;  colon    POLYPECTOMY  12/01/2022   POLYPECTOMY  12/01/2022   Procedure: POLYPECTOMY;  Surgeon: Cindie Carlin POUR, DO;  [facility]: [facility];  Service: Endoscopy;;    RETINAL DETACHMENT SURGERY Left 2002   Dr. Carmela    REVIEW OF SYSTEMS:  Constitutional: positive for fatigue Eyes: negative Ears, nose, mouth, throat, and face: negative Respiratory: positive for cough and dyspnea on exertion Cardiovascular: negative Gastrointestinal: negative Genitourinary:negative Integument/breast: negative Hematologic/lymphatic: negative Musculoskeletal:negative Neurological: negative Behavioral/Psych: negative Endocrine: negative Allergic/Immunologic: negative   PHYSICAL EXAMINATION: General appearance: alert, cooperative, fatigued, and no distress Head: Normocephalic, without obvious abnormality, atraumatic Neck: no adenopathy, no JVD, supple, symmetrical, trachea midline, and thyroid  not enlarged, symmetric, no tenderness/mass/nodules Lymph nodes: Cervical, supraclavicular, and axillary nodes normal. Resp: clear to auscultation bilaterally Back: symmetric, no curvature. ROM normal. No CVA tenderness. Cardio: regular rate and rhythm, S1, S2 normal, no murmur, click, rub or gallop GI: soft, non-tender; bowel sounds normal; no masses,  no organomegaly Extremities: extremities normal, atraumatic, no cyanosis or edema Neurologic: Alert and oriented X 3, normal strength and tone. Normal symmetric reflexes. Normal coordination and gait  ECOG PERFORMANCE STATUS: 1 - Symptomatic but completely ambulatory  Blood pressure (!) 150/92, pulse 84, temperature (!) 97.1 F (36.2 C), temperature source Temporal, resp. rate 16, height 6' 5 (1.956 m), weight 207 lb 11.2 oz (94.2 kg), SpO2 99%.  LABORATORY DATA: Lab Results  Component Value Date   WBC 6.7 04/25/2023   HGB 14.5 04/25/2023   HCT 40.8 04/25/2023   MCV 85.0 04/25/2023   PLT 198 04/25/2023      Chemistry      Component Value Date/Time   NA 137 04/25/2023 1335   NA 140 01/09/2023 0928   K 4.1 04/25/2023 1335   CL 103 04/25/2023 1335   CO2 27 04/25/2023 1335   BUN 21 04/25/2023 1335   BUN 14  01/09/2023 0928   CREATININE 0.90 04/25/2023 1335   CREATININE 1.04 01/30/2015 1407      Component Value Date/Time   CALCIUM  9.3 04/25/2023 1335   ALKPHOS 63 04/25/2023 1335   AST 23 04/25/2023 1335   ALT 22 04/25/2023 1335   BILITOT 0.7 04/25/2023 1335       RADIOGRAPHIC STUDIES: DG Chest 2 View Result Date: 08/29/2023 CLINICAL DATA:  Cough.  History of lung cancer. EXAM: CHEST - 2 VIEW COMPARISON:  05/23/2023 FINDINGS: Volume loss left hemithorax. Calcified mediastinal and hilar lymph nodes evident, as demonstrated on CT scan 05/30/2023. Calcified granuloma noted right lung apex and left mid lung. New nodular opacity is identified over the lateral right upper lung. No focal consolidation, pulmonary edema, or pleural effusion. The cardiopericardial silhouette is within normal limits  for size. No acute bony abnormality. IMPRESSION: 1. New nodular density right upper lung. Chest CT without contrast recommended to further evaluate. 2. Evidence for prior granulomatous disease. These results will be called to the ordering clinician or representative by the Radiologist Assistant, and communication documented in the PACS or [employer]. Electronically Signed   By: Camellia Candle M.D.   On: 08/29/2023 14:05    ASSESSMENT AND PLAN: This is a very pleasant 77 years old white male with stage IA (T1a, N0, M0) non-small cell lung cancer, adenocarcinoma presented with left lower lobe lung nodule status post left lower lobectomy with lymph node sampling under the care of Dr. Shyrl on March 22, 2023 with tumor size of 1.0 cm with focal visceral pleural invasion.  The patient has been on observation with no concerning complaints except for the recent cough and shortness of breath.    Suspicious Right Lung Nodules Stage 1A non-small cell lung cancer (NSCLC) status post left lower lobectomy in July 2024. Recent chest x-ray revealed suspicious right lung nodules. Differential diagnosis includes NSCLC  recurrence, pneumonia, or inflammation. Discussed need for chest CT with contrast to clarify nodule etiology, emphasizing accurate diagnosis before aggressive treatment. - Order chest CT with contrast within a week - Follow-up appointment in two weeks to discuss results  Persistent Cough and Congestion Persistent cough and congestion, initially treated with azithromycin and Tessalon  for presumed walking pneumonia. Symptoms include night sweats and chills, no current fever. Negative for COVID-19, flu, and strep throat. Partial symptom improvement. Discussed potential benefit of doxycycline  for lingering infection. - Prescribe doxycycline  100 mg twice daily for one week - Follow-up appointment in two weeks to assess response  Follow-up - Schedule follow-up appointment in two weeks - Ensure doxycycline  prescription is sent to [facility] pharmacy - Attempt to schedule chest CT scan at [facility]; if not possible, schedule locally.   He was advised to call immediately if he has any other concerning symptoms in the interval. The patient voices understanding of current disease status and treatment options and is in agreement with the current care plan.  All questions were answered. The patient knows to call the clinic with any problems, questions or concerns. We can certainly see the patient much sooner if necessary.  The total time spent in the appointment was 30 minutes.  Disclaimer: This note was dictated with voice recognition software. Similar sounding words can inadvertently be transcribed and may not be corrected upon review.

## 2023-09-13 DEATH — deceased

## 2023-09-14 ENCOUNTER — Encounter: Payer: Self-pay | Admitting: *Deleted

## 2023-09-14 ENCOUNTER — Ambulatory Visit: Payer: Self-pay | Admitting: *Deleted

## 2023-09-14 NOTE — Patient Outreach (Signed)
  Care Coordination   Follow Up Visit Note   09/14/2023 Name: Micheal Baker MRN: [id number] DOB: 1946/07/25  Micheal Baker is a 78 y.o. year old male who sees Marvine Rush, MD for primary care. I spoke with  Micheal Baker by phone today.  What matters to the patients health and wellness today?  Following up with oncologist RE: new lung nodule    Goals Addressed             This Visit's Progress    COMPLETED: Resolution of Conjunctivitis       Care Management Goals: Patient will use antibiotic eye drops in both eyes as directed Patient will practice good handwashing  Patient will follow-up with provider for any new or worsening symptoms Patient will reach out to RN Care Manager at [phone] with any care management needs     Resolution of Cough due to Viral Illness   On track    Care Management Goals: Patient will medications as prescribed Patient will remain active but rest as needed Patient will keep follow-up appointment with oncology Patient will work with schedulers to setup CT scan that was ordered by oncologist Patient will reach out to RN Care Manager at [phone] with any care management needs          SDOH assessments and interventions completed:  No     Care Coordination Interventions:  Yes, provided  Interventions Today    Flowsheet Row Most Recent Value  Chronic Disease   Chronic disease during today's visit Other  [H/O lung cancer, lung nodule]  General Interventions   General Interventions Discussed/Reviewed General Interventions Discussed, General Interventions Reviewed, Doctor Visits  Doctor Visits Discussed/Reviewed Doctor Visits Discussed, Doctor Visits Reviewed, Specialist  [Discussed visit with oncologist RE: lung nodule and plan to have CT scan]  PCP/Specialist Visits Compliance with follow-up visit  [09/26/23 with oncology department]  Exercise Interventions   Exercise Discussed/Reviewed Physical Activity  Physical Activity  Discussed/Reviewed Physical Activity Discussed, Physical Activity Reviewed  Micheal Baker to perform ADLs]  Education Interventions   Education Provided Provided Education  Provided Verbal Education On Medication, When to see the doctor, Exercise, Mental Health/Coping with Illness  Mental Health Interventions   Mental Health Discussed/Reviewed Mental Health Discussed, Mental Health Reviewed  [managing well at this time]  Pharmacy Interventions   Pharmacy Dicussed/Reviewed Pharmacy Topics Discussed, Pharmacy Topics Reviewed, Medications and their functions  [taking medications, including doxycycline , as instructed]       Follow up plan: Follow up call scheduled for 09/22/23  Encounter Outcome:  Patient Visit Completed   Josette Pellet, RN, BSN Care Manager [place]  [facility]  Population Health  Direct Dial: [phone] Main #: [phone]

## 2023-09-18 ENCOUNTER — Ambulatory Visit (HOSPITAL_COMMUNITY)
Admission: RE | Admit: 2023-09-18 | Discharge: 2023-09-18 | Disposition: A | Discharge status: Home / Self Care | Payer: Medicare PPO | Source: Ambulatory Visit | Attending: Internal Medicine | Admitting: Internal Medicine

## 2023-09-18 DIAGNOSIS — J90 Pleural effusion, not elsewhere classified: Secondary | ICD-10-CM | POA: Diagnosis not present

## 2023-09-18 DIAGNOSIS — C3490 Malignant neoplasm of unspecified part of unspecified bronchus or lung: Secondary | ICD-10-CM | POA: Diagnosis not present

## 2023-09-18 DIAGNOSIS — J984 Other disorders of lung: Secondary | ICD-10-CM | POA: Diagnosis not present

## 2023-09-18 MED ORDER — IOHEXOL 300 MG/ML  SOLN
75.0000 mL | Freq: Once | INTRAMUSCULAR | Status: AC | PRN
  Administered 2023-09-18: 75 mL via INTRAVENOUS

## 2023-09-22 ENCOUNTER — Encounter: Payer: Self-pay | Admitting: *Deleted

## 2023-09-22 ENCOUNTER — Ambulatory Visit: Payer: Self-pay | Admitting: *Deleted

## 2023-09-22 NOTE — Patient Outreach (Signed)
  Care Coordination   Follow Up Visit Note   09/22/2023 Name: Micheal Baker MRN: [id number] DOB: 08/17/1946  Micheal Baker is a 78 y.o. year old male who sees Marvine Rush, MD for primary care. I spoke with  Micheal Baker by phone today.  What matters to the patients health and wellness today?  Following up with oncologist regarding CT scan results.    Goals Addressed             This Visit's Progress    Resolution of Cough due to Viral Illness   On track    Care Management Goals: Patient will medications as prescribed Patient will remain active but rest as needed Patient will keep follow-up appointment with oncology Patient will work with schedulers to setup CT scan that was ordered by oncologist Patient will reach out to RN Care Manager at [phone] with any care management needs        SDOH assessments and interventions completed:  Yes  SDOH Interventions Today    Flowsheet Row Most Recent Value  SDOH Interventions   Transportation Interventions Intervention Not Indicated  Financial Strain Interventions Intervention Not Indicated        Care Coordination Interventions:  Yes, provided  Interventions Today    Flowsheet Row Most Recent Value  Chronic Disease   Chronic disease during today's visit --  [H/O lung cancer, lung nodule]  General Interventions   General Interventions Discussed/Reviewed General Interventions Discussed, General Interventions Reviewed, Doctor Visits  [URI symptoms resolved]  Doctor Visits Discussed/Reviewed Doctor Visits Discussed, Doctor Visits Reviewed, Specialist  PCP/Specialist Visits Compliance with follow-up visit  [oncologist 09/26/23 to review CT scan]  Exercise Interventions   Exercise Discussed/Reviewed Physical Activity  Physical Activity Discussed/Reviewed Physical Activity Discussed, Physical Activity Reviewed  Education Interventions   Provided Verbal Education On Medication, Other, When to see the doctor, Mental  Health/Coping with Illness, Exercise  [CT has not been resulted]  Mental Health Interventions   Mental Health Discussed/Reviewed Mental Health Discussed, Mental Health Reviewed  [managing well at this time]  Nutrition Interventions   Nutrition Discussed/Reviewed Nutrition Discussed, Nutrition Reviewed  Pharmacy Interventions   Pharmacy Dicussed/Reviewed Pharmacy Topics Discussed, Pharmacy Topics Reviewed, Medications and their functions  Safety Interventions   Safety Discussed/Reviewed Safety Discussed, Safety Reviewed       Follow up plan: Follow up call scheduled for 10/12/23    Encounter Outcome:  Patient Visit Completed   Josette Pellet, RN, BSN [place]  [facility], [facility] RN Care Manager [email or web address] Direct Dial: [phone] Fax: [phone]

## 2023-09-23 NOTE — Progress Notes (Signed)
 [facility] OFFICE PROGRESS NOTE  Micheal Rush, MD [address] [place] [address]  DIAGNOSIS: Stage IA (T1a, N0, M0) non-small cell lung cancer, adenocarcinoma presented with left lower lobe lung nodule diagnosed in July 2024.   PRIOR THERAPY: status post left lower lobectomy with lymph node sampling under the care of Dr. Shyrl on March 22, 2023 with tumor size of 1.0 cm with focal visceral pleural invasion.   CURRENT THERAPY: Observation   INTERVAL HISTORY: Micheal Baker 78 y.o. male returns to the clinic today for a follow-up visit.  The patient has a history of stage Ia non-small cell lung cancer that was diagnosed in July 2024.  He is status post resection.  In December 2024, the patient was experiencing respiratory illness with persistent cough and congestion for which he was prescribed azithromycin and cough medication.  He did not have any improvement in his symptoms and he follow-up with Dr. Sherrod on 09/12/2023.  Dr. Sherrod gave him another course of antibiotics with doxycycline  and recommended a restaging CT scan.  In the interval since last being seen the patient reports improvement in his symptoms and he has felt a lot better over the last 3-4 days.  He denies any fever, chills, or unexplained weight loss. He did have a few nights of night sweats in a row. He has not been as active so he denies dyspnea on exertion.  Denies any hemoptysis or chest pain.  Denies any nausea, vomiting, diarrhea, or constipation.  Denies any headache or visual changes.  He recently had a restaging CT scan.  He is here today for evaluation to review his scan results.    MEDICAL HISTORY: Past Medical History:  Diagnosis Date   Acute medial meniscus tear of left knee    Depression    Frequent PVCs    GERD (gastroesophageal reflux disease)    History of hiatal hernia    History of inguinal hernia    History of kidney stones    Hypertension    Hypothyroidism    Lung  cancer (HCC)    NSVT (nonsustained ventricular tachycardia) (HCC)    Osteoarthritis    Sleep apnea    Thyroid  disease     ALLERGIES:  is allergic to avelox [moxifloxacin] and penicillins.  MEDICATIONS:  Current Outpatient Medications  Medication Sig Dispense Refill   acetaminophen  (TYLENOL ) 500 MG tablet Take 500-1,000 mg by mouth every 6 (six) hours as needed for mild pain, headache or moderate pain.     ALPHA LIPOIC ACID PO Take 1 capsule by mouth in the morning.     azithromycin (ZITHROMAX) 250 MG tablet Take 250 mg by mouth daily. 2 tablets for first day and 1 tablet the next 4 days     B Complex-C (B-COMPLEX WITH VITAMIN C) tablet Take 1 tablet by mouth in the morning.     buPROPion  (WELLBUTRIN  XL) 150 MG 24 hr tablet Take 150 mg by mouth daily.     calcium  carbonate (TUMS - DOSED IN MG ELEMENTAL CALCIUM ) 500 MG chewable tablet Chew 1 tablet (200 mg of elemental calcium  total) by mouth 3 (three) times daily with meals. (Patient taking differently: Chew 1 tablet by mouth 3 (three) times daily as needed for indigestion or heartburn.) 90 tablet 4   chlorpheniramine-HYDROcodone  (TUSSIONEX) 10-8 MG/5ML Take 5 mLs by mouth every 12 (twelve) hours as needed for cough.     diclofenac Sodium (VOLTAREN) 1 % GEL Apply 1 Application topically 4 (four) times daily  as needed (knee pain).     doxycycline  (VIBRA -TABS) 100 MG tablet Take 1 tablet (100 mg total) by mouth 2 (two) times daily. 14 tablet 0   doxylamine, Sleep, (UNISOM) 25 MG tablet Take 25 mg by mouth at bedtime as needed for sleep.     escitalopram  (LEXAPRO ) 20 MG tablet Take 20 mg by mouth daily.     finasteride  (PROSCAR ) 5 MG tablet Take 1 tablet (5 mg total) by mouth daily. 90 tablet 3   fluticasone  (FLONASE ) 50 MCG/ACT nasal spray Place 1 spray into both nostrils daily as needed for allergies.     levothyroxine  (SYNTHROID ) 88 MCG tablet Take 1 tablet (88 mcg total) by mouth daily before breakfast. 30 tablet 2   pantoprazole  (PROTONIX )  40 MG tablet TAKE ONE TABLET BY MOUTH ONCE DAILY. 90 tablet 0   Propylene Glycol (SYSTANE COMPLETE) 0.6 % SOLN Place 1 drop into both eyes as needed (dry eyes).     silodosin  (RAPAFLO ) 8 MG CAPS capsule Take 1 capsule (8 mg total) by mouth 2 (two) times daily. 60 capsule 11   trimethoprim -polymyxin b  (POLYTRIM ) ophthalmic solution Place 1 drop into both eyes every 6 (six) hours. 10 mL 0   benzonatate  (TESSALON ) 100 MG capsule Take 1 capsule (100 mg total) by mouth 3 (three) times daily as needed for cough. Do not take with alcohol  or while driving or operating heavy machinery.  May cause drowsiness. (Patient not taking: Reported on 09/26/2023) 21 capsule 0   No current facility-administered medications for this visit.    SURGICAL HISTORY:  Past Surgical History:  Procedure Laterality Date   BIOPSY  12/01/2022   Procedure: BIOPSY;  Surgeon: Cindie Carlin POUR, DO;  [facility]: [facility];  Service: Endoscopy;;   BRONCHIAL BIOPSY  02/07/2023   Procedure: BRONCHIAL BIOPSIES;  Surgeon: Brenna Adine CROME, DO;  Location: [facility];  Service: Pulmonary;;   BRONCHIAL NEEDLE ASPIRATION BIOPSY  02/07/2023   Procedure: BRONCHIAL NEEDLE ASPIRATION BIOPSIES;  Surgeon: Brenna Adine CROME, DO;  Location: [facility];  Service: Pulmonary;;   CATARACT EXTRACTION     left   COLONOSCOPY N/A 04/08/2016   Surgeon: Margo CROME Haddock, MD; one 6 mm polyp in the cecum removed, one 4 mm polyp in the descending colon removed, nonbleeding internal hemorrhoids.  Pathology with hyperplastic polyps.  Recommended colonoscopy in 10 years.   COLONOSCOPY  12/01/2022   COLONOSCOPY WITH PROPOFOL  N/A 12/01/2022   Procedure: COLONOSCOPY WITH PROPOFOL ;  Surgeon: Cindie Carlin POUR, DO;  [facility]: [facility];  Service: Endoscopy;  Laterality: N/A;  8:15 am, asa 3   endosocopy  12/01/2022   ESOPHAGOGASTRODUODENOSCOPY (EGD) WITH PROPOFOL  N/A 12/01/2022   Procedure: ESOPHAGOGASTRODUODENOSCOPY (EGD) WITH PROPOFOL ;  Surgeon: Cindie Carlin POUR, DO;  [facility]: [facility];  Service: Endoscopy;  Laterality: N/A;   EYE SURGERY     left-scar tissue   FIDUCIAL MARKER PLACEMENT  02/07/2023   Procedure: FIDUCIAL MARKER PLACEMENT;  Surgeon: Brenna Adine CROME, DO;  Location: [facility];  Service: Pulmonary;;   groin surgery Right 2010   growth removed    KNEE ARTHROSCOPY     left   KNEE ARTHROSCOPY WITH LATERAL MENISECTOMY Right 03/11/2015   Procedure: RIGHT KNEE ARTHROSCOPY WITH MEDIALAND LATERAL MENISECTOMY;  Surgeon: Lamar Millman, MD;  Location: [facility];  Service: Orthopedics;  Laterality: Right;   KNEE ARTHROSCOPY WITH MEDIAL MENISECTOMY Left 06/27/2014   Procedure: LEFT KNEE ARTHROSCOPY WITH PARTIAL MEDIAL AND LATERAL MENISECTOMIES AND CHONDROPLASTY;  Surgeon: Lamar  DELENA Millman, MD;  Location: [facility];  Service: Orthopedics;  Laterality: Left;   KNEE ARTHROSCOPY WITH MEDIAL MENISECTOMY Right 03/11/2015   Procedure: KNEE ARTHROSCOPY WITH MEDIAL MENISECTOMY;  Surgeon: Lamar Millman, MD;  Location: [facility];  Service: Orthopedics;  Laterality: Right;   LEFT HEART CATHETERIZATION WITH CORONARY ANGIOGRAM N/A 09/14/2011   Procedure: LEFT HEART CATHETERIZATION WITH CORONARY ANGIOGRAM;  Surgeon: Vinie KYM Maxcy, MD;  Location: [facility];  Service: Cardiovascular;  Laterality: N/A;   LYMPH NODE DISSECTION Left 03/22/2023   Procedure: LYMPH NODE DISSECTION;  Surgeon: Shyrl Linnie KIDD, MD;  Location: [facility];  Service: Thoracic;  Laterality: Left;   POLYPECTOMY  04/08/2016   Procedure: POLYPECTOMY;  Surgeon: Margo LITTIE Haddock, MD;  [facility];  Service: Endoscopy;;  colon    POLYPECTOMY  12/01/2022   POLYPECTOMY  12/01/2022   Procedure: POLYPECTOMY;  Surgeon: Cindie Carlin POUR, DO;  [facility]: [facility];  Service: Endoscopy;;   RETINAL DETACHMENT SURGERY Left 2002   Dr. Carmela    REVIEW OF SYSTEMS:   Review of Systems  Constitutional: Negative for appetite change,  chills, fatigue, fever and unexpected weight change.  HENT: Improving congestion. Negative for mouth sores, nosebleeds, sore throat and trouble swallowing.   Eyes: Negative for eye problems and icterus.  Respiratory: Improving cough. Negative for hemoptysis, shortness of breath and wheezing.   Cardiovascular: Negative for chest pain and leg swelling.  Gastrointestinal: Negative for abdominal pain, constipation, diarrhea, nausea and vomiting.  Genitourinary: Negative for bladder incontinence, difficulty urinating, dysuria, frequency and hematuria.   Musculoskeletal: Negative for back pain, gait problem, neck pain and neck stiffness.  Skin: Negative for itching and rash.  Neurological: Negative for dizziness, extremity weakness, gait problem, headaches, light-headedness and seizures.  Hematological: Negative for adenopathy. Does not bruise/bleed easily.  Psychiatric/Behavioral: Negative for confusion, depression and sleep disturbance. The patient is not nervous/anxious.     PHYSICAL EXAMINATION:  Blood pressure (!) 142/76, pulse 79, temperature 98.4 F (36.9 C), temperature source Temporal, resp. rate 16, weight 208 lb 4.8 oz (94.5 kg), SpO2 98%.  ECOG PERFORMANCE STATUS:  Physical Exam  Constitutional: Oriented to person, place, and time and well-developed, well-nourished, and in no distress.  HENT:  Head: Normocephalic and atraumatic.  Mouth/Throat: Oropharynx is clear and moist. No oropharyngeal exudate.  Eyes: Conjunctivae are normal. Right eye exhibits no discharge. Left eye exhibits no discharge. No scleral icterus.  Neck: Normal range of motion. Neck supple.  Cardiovascular: Normal rate, regular rhythm, normal heart sounds and intact distal pulses.   Pulmonary/Chest: Effort normal and breath sounds normal. Positive for some mild expiratory wheezing. No respiratory distress. No rales.  Abdominal: Soft. Bowel sounds are normal. Exhibits no distension and no mass. There is no  tenderness.  Musculoskeletal: Normal range of motion. Exhibits no edema.  Lymphadenopathy:    No cervical adenopathy.  Neurological: Alert and oriented to person, place, and time. Exhibits normal muscle tone. Gait normal. Coordination normal.  Skin: Skin is warm and dry. No rash noted. Not diaphoretic. No erythema. No pallor.  Psychiatric: Mood, memory and judgment normal.  Vitals reviewed.  LABORATORY DATA: Lab Results  Component Value Date   WBC 6.2 09/26/2023   HGB 14.5 09/26/2023   HCT 41.7 09/26/2023   MCV 85.3 09/26/2023   PLT 216 09/26/2023      Chemistry      Component Value Date/Time   NA 136 09/26/2023 1448   NA 140 01/09/2023 0928  K 3.9 09/26/2023 1448   CL 101 09/26/2023 1448   CO2 29 09/26/2023 1448   BUN 19 09/26/2023 1448   BUN 14 01/09/2023 0928   CREATININE 1.03 09/26/2023 1448   CREATININE 1.04 01/30/2015 1407      Component Value Date/Time   CALCIUM  9.8 09/26/2023 1448   ALKPHOS 62 09/26/2023 1448   AST 23 09/26/2023 1448   ALT 18 09/26/2023 1448   BILITOT 0.7 09/26/2023 1448       RADIOGRAPHIC STUDIES:  CT Chest W Contrast Result Date: 09/25/2023 CLINICAL DATA:  Non-small-cell lung cancer, staging. * Tracking Code: BO * EXAM: CT CHEST WITH CONTRAST TECHNIQUE: Multidetector CT imaging of the chest was performed during intravenous contrast administration. RADIATION DOSE REDUCTION: This exam was performed according to the departmental dose-optimization program which includes automated exposure control, adjustment of the mA and/or kV according to patient size and/or use of iterative reconstruction technique. CONTRAST:  75mL OMNIPAQUE  IOHEXOL  300 MG/ML  SOLN COMPARISON:  X-ray 08/29/2023 and older.  CT 05/30/2023. FINDINGS: Cardiovascular: Heart is nonenlarged. No pericardial effusion. Coronary artery calcifications are seen. The thoracic aorta has a normal course and caliber with mild atherosclerotic plaque. Mediastinum/Nodes: Small thyroid  gland. Normal  caliber thoracic esophagus but there is a moderate hiatal hernia once again identified. Multiple calcified mediastinal and hilar nodes identified consistent with old granulomatous disease. No new abnormal lymph node enlargement present in the axillary regions, hilum or mediastinum. Lungs/Pleura: Surgical changes of the left-sided lower lobectomy with some persistent scarring and atelectatic changes. Previously on CT there is a small pleural effusion which is substantially decreased. Trace residual. Small areas of nodularity with thickening in the medial left lung apex is stable on series 6 image 29 and 30. Subtle area ground-glass with tree-in-bud nodularity along the left lung inferiorly such as axial series 6, image 111 and 119. Calcified bilateral lung nodules are seen consistent with old granulomatous disease. Right lung has some lower lobe medial scarring atelectatic changes. No consolidation, pneumothorax or effusion. There is a 5 mm right apical nodule which is stable. Series 6, image 31. recent chest x-ray showed a vague areas of density overlying the right midthorax. No CT correlate to this finding. Upper Abdomen: Adrenal glands are preserved in the upper abdomen. Hepatic granulomas are identified. Segment 6 hepatic cyst is identified which appears simple. Splenic granulomas. Musculoskeletal: Mild curvature of the spine with some degenerative changes. IMPRESSION: Stable postsurgical changes of the lobectomy. Significantly improved left pleural effusion with trace residual. Stable smaller areas of nodularity in the left lung apex. New subtle, small area of tree-in-bud nodularity with some ground-glass along the left lower lobe. Recommend follow up in 3-6 months. No CT correlate to the finding by chest x-ray from December 2024 in the right lung. This may have resolved. Evidence of old granulomatous disease. Moderate hiatal hernia. Aortic Atherosclerosis (ICD10-I70.0). Electronically Signed   By: Ranell Bring  M.D.   On: 09/25/2023 17:36   DG Chest 2 View Result Date: 08/29/2023 CLINICAL DATA:  Cough.  History of lung cancer. EXAM: CHEST - 2 VIEW COMPARISON:  05/23/2023 FINDINGS: Volume loss left hemithorax. Calcified mediastinal and hilar lymph nodes evident, as demonstrated on CT scan 05/30/2023. Calcified granuloma noted right lung apex and left mid lung. New nodular opacity is identified over the lateral right upper lung. No focal consolidation, pulmonary edema, or pleural effusion. The cardiopericardial silhouette is within normal limits for size. No acute bony abnormality. IMPRESSION: 1. New nodular density right upper lung. Chest  CT without contrast recommended to further evaluate. 2. Evidence for prior granulomatous disease. These results will be called to the ordering clinician or representative by the Radiologist Assistant, and communication documented in the PACS or [employer]. Electronically Signed   By: Camellia Candle M.D.   On: 08/29/2023 14:05     ASSESSMENT/PLAN:  This is a very pleasant 79 year old Caucasian male with stage IA (T1a, N0, M0) non-small cell lung cancer, adenocarcinoma presented with left lower lobe lung nodule status post left lower lobectomy with lymph node sampling under the care of Dr. Shyrl on March 22, 2023 with tumor size of 1.0 cm with focal visceral pleural invasion.  The patient has been on observation with no concerning complaints except for the recent cough and shortness of breath.  Dr. Sherrod arranged for a restaging CT scan. The patient was seen with Dr. Sherrod today.  Dr. Sherrod personally and independently reviewed the scan and discussed results with the patient today.  The scan showed no evidence of disease progression. The scan showed some new subtle small area of tree-in-bud nodularity with some ground-glass along the left lower lobe which could be due to his recent URI .  Dr. Sherrod recommends he continue on observation with a repeat CT scan in 6  months.   We will see him back 1 week after the scan to review the results in the office.   The patient was supposed to see pulmonary medicine in February to consider thoracentesis. However, his CT showed significantly improved left pleural effusion with only trace residual. We let the patient know he does not need to see pulmonary medicine as they would not be able to perform thoracentesis. His pleural effusion may have been post surgical.   The patient was advised to call immediately if he has any concerning symptoms in the interval. The patient voices understanding of current disease status and treatment options and is in agreement with the current care plan. All questions were answered. The patient knows to call the clinic with any problems, questions or concerns. We can certainly see the patient much sooner if necessary   Orders Placed This Encounter  Procedures   CT Chest W Contrast    Standing Status:   Future    Expected Date:   03/12/2024    Expiration Date:   09/25/2024    If indicated for the ordered procedure, I authorize the administration of contrast media per Radiology protocol:   Yes    Does the patient have a contrast media/X-ray dye allergy ?:   No    Preferred imaging location?:   [facility]   CBC with Differential (Cancer Center Only)    Standing Status:   Future    Expected Date:   03/25/2024    Expiration Date:   09/25/2024   CMP (Cancer Center only)    Standing Status:   Future    Expected Date:   03/25/2024    Expiration Date:   09/25/2024      [place] L Amor Hyle, PA-C 09/26/23  ADDENDUM: Hematology/Oncology Attending: I had a face-to-face encounter with the patient today.  I reviewed his record, lab, scan and recommended his care plan.  This is a very pleasant 78 years old white male with history of stage Ia non-small cell lung cancer, adenocarcinoma diagnosed in July 2024 status post left lower lobectomy with lymph node sampling in July 2024 with  tumor size of 1.0 cm and focal visceral pleural invasion.  The patient has  been on observation since that time.  He recently experienced upper respiratory infection with cough and chest congestion and was concerned about disease recurrence.  He was treated with azithromycin with no improvement of his symptoms.  He requested to be seen sooner and I saw him 2 weeks ago and I ordered the scan of the chest and I also started him on doxycycline .  The patient is feeling much better today. He had repeat CT scan of the chest that showed no concerning findings for disease recurrence or metastasis that showed new subpleural small areas of tree-in-bud nodularity with some groundglass along the left lower lobe likely secondary to recent upper respiratory infection. I recommended for the patient to continue on observation with repeat CT scan of the chest in 6 months. The patient was advised to call immediately if he has any other concerning symptoms in the interval. The total time spent in the appointment was 20 minutes. Disclaimer: This note was dictated with voice recognition software. Similar sounding words can inadvertently be transcribed and may be missed upon review. Sherrod MARLA Sherrod, MD

## 2023-09-25 DIAGNOSIS — M79646 Pain in unspecified finger(s): Secondary | ICD-10-CM | POA: Insufficient documentation

## 2023-09-25 DIAGNOSIS — G4733 Obstructive sleep apnea (adult) (pediatric): Secondary | ICD-10-CM | POA: Diagnosis not present

## 2023-09-25 DIAGNOSIS — M65341 Trigger finger, right ring finger: Secondary | ICD-10-CM | POA: Diagnosis not present

## 2023-09-26 ENCOUNTER — Inpatient Hospital Stay: Payer: Medicare PPO | Attending: Internal Medicine

## 2023-09-26 ENCOUNTER — Inpatient Hospital Stay: Payer: Medicare PPO | Admitting: Physician Assistant

## 2023-09-26 VITALS — BP 142/76 | HR 79 | Temp 98.4°F | Resp 16 | Wt 208.3 lb

## 2023-09-26 DIAGNOSIS — Z902 Acquired absence of lung [part of]: Secondary | ICD-10-CM | POA: Insufficient documentation

## 2023-09-26 DIAGNOSIS — C3490 Malignant neoplasm of unspecified part of unspecified bronchus or lung: Secondary | ICD-10-CM

## 2023-09-26 DIAGNOSIS — G4733 Obstructive sleep apnea (adult) (pediatric): Secondary | ICD-10-CM | POA: Diagnosis not present

## 2023-09-26 DIAGNOSIS — Z85118 Personal history of other malignant neoplasm of bronchus and lung: Secondary | ICD-10-CM | POA: Diagnosis not present

## 2023-09-26 LAB — CMP (CANCER CENTER ONLY)
ALT: 18 U/L (ref 0–44)
AST: 23 U/L (ref 15–41)
Albumin: 4.3 g/dL (ref 3.5–5.0)
Alkaline Phosphatase: 62 U/L (ref 38–126)
Anion gap: 6 (ref 5–15)
BUN: 19 mg/dL (ref 8–23)
CO2: 29 mmol/L (ref 22–32)
Calcium: 9.8 mg/dL (ref 8.9–10.3)
Chloride: 101 mmol/L (ref 98–111)
Creatinine: 1.03 mg/dL (ref 0.61–1.24)
GFR, Estimated: >60 mL/min (ref >60)
Glucose, Bld: 91 mg/dL (ref 70–99)
Potassium: 3.9 mmol/L (ref 3.5–5.1)
Sodium: 136 mmol/L (ref 135–145)
Total Bilirubin: 0.7 mg/dL (ref 0.0–1.2)
Total Protein: 7 g/dL (ref 6.5–8.1)

## 2023-09-26 LAB — CBC WITH DIFFERENTIAL (CANCER CENTER ONLY)
Abs Immature Granulocytes: 0.01 10*3/uL (ref 0.00–0.07)
Basophils Absolute: 0 10*3/uL (ref 0.0–0.1)
Basophils Relative: 0 %
Eosinophils Absolute: 0.2 10*3/uL (ref 0.0–0.5)
Eosinophils Relative: 3 %
HCT: 41.7 % (ref 39.0–52.0)
Hemoglobin: 14.5 g/dL (ref 13.0–17.0)
Immature Granulocytes: 0 %
Lymphocytes Relative: 15 %
Lymphs Abs: 0.9 10*3/uL (ref 0.7–4.0)
MCH: 29.7 pg (ref 26.0–34.0)
MCHC: 34.8 g/dL (ref 30.0–36.0)
MCV: 85.3 fL (ref 80.0–100.0)
Monocytes Absolute: 0.5 10*3/uL (ref 0.1–1.0)
Monocytes Relative: 9 %
Neutro Abs: 4.5 10*3/uL (ref 1.7–7.7)
Neutrophils Relative %: 73 %
Platelet Count: 216 10*3/uL (ref 150–400)
RBC: 4.89 MIL/uL (ref 4.22–5.81)
RDW: 12.7 % (ref 11.5–15.5)
WBC Count: 6.2 10*3/uL (ref 4.0–10.5)
nRBC: 0 % (ref 0.0–0.2)

## 2023-09-27 ENCOUNTER — Other Ambulatory Visit: Payer: Medicare PPO

## 2023-09-27 ENCOUNTER — Ambulatory Visit: Payer: Medicare PPO | Admitting: Pulmonary Disease

## 2023-09-27 DIAGNOSIS — R972 Elevated prostate specific antigen [PSA]: Secondary | ICD-10-CM

## 2023-09-28 LAB — PSA, TOTAL AND FREE
PSA, Free Pct: 17.4 %
PSA, Free: 0.68 ng/mL
Prostate Specific Ag, Serum: 3.9 ng/mL (ref 0.0–4.0)

## 2023-09-30 ENCOUNTER — Emergency Department (HOSPITAL_COMMUNITY): Payer: Medicare PPO

## 2023-09-30 ENCOUNTER — Emergency Department (HOSPITAL_COMMUNITY)
Admission: EM | Admit: 2023-09-30 | Discharge: 2023-09-30 | Disposition: A | Discharge status: Home / Self Care | Payer: Medicare PPO | Attending: Emergency Medicine | Admitting: Emergency Medicine

## 2023-09-30 ENCOUNTER — Other Ambulatory Visit: Payer: Self-pay

## 2023-09-30 ENCOUNTER — Encounter (HOSPITAL_COMMUNITY): Payer: Self-pay

## 2023-09-30 DIAGNOSIS — W06XXXA Fall from bed, initial encounter: Secondary | ICD-10-CM | POA: Insufficient documentation

## 2023-09-30 DIAGNOSIS — S12601A Unspecified nondisplaced fracture of seventh cervical vertebra, initial encounter for closed fracture: Secondary | ICD-10-CM | POA: Diagnosis not present

## 2023-09-30 DIAGNOSIS — M25561 Pain in right knee: Secondary | ICD-10-CM | POA: Diagnosis not present

## 2023-09-30 DIAGNOSIS — S8991XA Unspecified injury of right lower leg, initial encounter: Secondary | ICD-10-CM | POA: Diagnosis not present

## 2023-09-30 DIAGNOSIS — M1711 Unilateral primary osteoarthritis, right knee: Secondary | ICD-10-CM | POA: Diagnosis not present

## 2023-09-30 DIAGNOSIS — E039 Hypothyroidism, unspecified: Secondary | ICD-10-CM | POA: Insufficient documentation

## 2023-09-30 DIAGNOSIS — I10 Essential (primary) hypertension: Secondary | ICD-10-CM | POA: Diagnosis not present

## 2023-09-30 DIAGNOSIS — Z85118 Personal history of other malignant neoplasm of bronchus and lung: Secondary | ICD-10-CM | POA: Diagnosis not present

## 2023-09-30 DIAGNOSIS — S80212A Abrasion, left knee, initial encounter: Secondary | ICD-10-CM | POA: Diagnosis not present

## 2023-09-30 DIAGNOSIS — S12400A Unspecified displaced fracture of fifth cervical vertebra, initial encounter for closed fracture: Secondary | ICD-10-CM | POA: Diagnosis not present

## 2023-09-30 DIAGNOSIS — R519 Headache, unspecified: Secondary | ICD-10-CM | POA: Diagnosis not present

## 2023-09-30 DIAGNOSIS — M25562 Pain in left knee: Secondary | ICD-10-CM | POA: Diagnosis not present

## 2023-09-30 DIAGNOSIS — S12691A Other nondisplaced fracture of seventh cervical vertebra, initial encounter for closed fracture: Secondary | ICD-10-CM | POA: Insufficient documentation

## 2023-09-30 DIAGNOSIS — M1712 Unilateral primary osteoarthritis, left knee: Secondary | ICD-10-CM | POA: Diagnosis not present

## 2023-09-30 DIAGNOSIS — W19XXXA Unspecified fall, initial encounter: Secondary | ICD-10-CM

## 2023-09-30 DIAGNOSIS — R9082 White matter disease, unspecified: Secondary | ICD-10-CM | POA: Diagnosis not present

## 2023-09-30 DIAGNOSIS — M542 Cervicalgia: Secondary | ICD-10-CM | POA: Diagnosis present

## 2023-09-30 DIAGNOSIS — Z79899 Other long term (current) drug therapy: Secondary | ICD-10-CM | POA: Insufficient documentation

## 2023-09-30 DIAGNOSIS — S80211A Abrasion, right knee, initial encounter: Secondary | ICD-10-CM | POA: Insufficient documentation

## 2023-09-30 DIAGNOSIS — S8992XA Unspecified injury of left lower leg, initial encounter: Secondary | ICD-10-CM | POA: Diagnosis not present

## 2023-09-30 MED ORDER — OXYCODONE-ACETAMINOPHEN 5-325 MG PO TABS
1.0000 | ORAL_TABLET | Freq: Once | ORAL | Status: AC
  Administered 2023-09-30: 1 via ORAL
  Filled 2023-09-30: qty 1

## 2023-09-30 MED ORDER — OXYCODONE-ACETAMINOPHEN 5-325 MG PO TABS: 1.0000 | ORAL_TABLET | Freq: Four times a day (QID) | ORAL | 0 refills | Status: DC | PRN

## 2023-09-30 NOTE — ED Provider Notes (Signed)
[facility] AT [facility] Provider Note   CSN: [id number] Arrival date & time: 09/30/23  0545     History  Chief Complaint  Patient presents with   Marletta Lor    JUAQUIN BURLIN is a 78 y.o. male.  HPI     This is a 78 year old male who presents after falling out of bed.  Patient reports that he was having a bad dream when he rolled out of bed.  He woke up on the floor.  He is most complaining of frontal head pain, neck pain and bilateral knee pain.  He is not on any blood thinners.  He has been ambulatory since that time.  Denies weakness, numbness, tingling of the upper or lower extremities.  Home Medications Prior to Admission medications   Medication Sig Start Date End Date Taking? Authorizing Provider  acetaminophen (TYLENOL) 500 MG tablet Take 500-1,000 mg by mouth every 6 (six) hours as needed for mild pain, headache or moderate pain.    [provider]  ALPHA LIPOIC ACID PO Take 1 capsule by mouth in the morning.    [provider]  azithromycin (ZITHROMAX) 250 MG tablet Take 250 mg by mouth daily. 2 tablets for first day and 1 tablet the next 4 days    [provider]  B Complex-C (B-COMPLEX WITH VITAMIN C) tablet Take 1 tablet by mouth in the morning.    [provider]  benzonatate (TESSALON) 100 MG capsule Take 1 capsule (100 mg total) by mouth 3 (three) times daily as needed for cough. Do not take with alcohol or while driving or operating heavy machinery.  May cause drowsiness. Patient not taking: Reported on 09/26/2023 08/29/23   Valentino Nose, NP  buPROPion (WELLBUTRIN XL) 150 MG 24 hr tablet Take 150 mg by mouth daily. 02/01/23   [provider]  calcium carbonate (TUMS - DOSED IN MG ELEMENTAL CALCIUM) 500 MG chewable tablet Chew 1 tablet (200 mg of elemental calcium total) by mouth 3 (three) times daily with meals. Patient taking differently: Chew 1 tablet by mouth 3 (three) times daily as  needed for indigestion or heartburn. 12/28/22   Shon Hale, MD  chlorpheniramine-HYDROcodone (TUSSIONEX) 10-8 MG/5ML Take 5 mLs by mouth every 12 (twelve) hours as needed for cough. 09/01/23   [provider]  diclofenac Sodium (VOLTAREN) 1 % GEL Apply 1 Application topically 4 (four) times daily as needed (knee pain).    [provider]  doxycycline (VIBRA-TABS) 100 MG tablet Take 1 tablet (100 mg total) by mouth 2 (two) times daily. 09/12/23   Si Gaul, MD  doxylamine, Sleep, (UNISOM) 25 MG tablet Take 25 mg by mouth at bedtime as needed for sleep.    [provider]  escitalopram (LEXAPRO) 20 MG tablet Take 20 mg by mouth daily.    [provider]  finasteride (PROSCAR) 5 MG tablet Take 1 tablet (5 mg total) by mouth daily. 04/07/23   McKenzie, Mardene Celeste, MD  fluticasone (FLONASE) 50 MCG/ACT nasal spray Place 1 spray into both nostrils daily as needed for allergies. 09/20/22   [provider]  levothyroxine (SYNTHROID) 88 MCG tablet Take 1 tablet (88 mcg total) by mouth daily before breakfast. 02/09/23 02/09/24  Shon Hale, MD  pantoprazole (PROTONIX) 40 MG tablet TAKE ONE TABLET BY MOUTH ONCE DAILY. 07/24/23   Aida Raider, NP  Propylene Glycol (SYSTANE COMPLETE) 0.6 % SOLN Place 1 drop into both eyes as needed (dry eyes).  [provider]  silodosin (RAPAFLO) 8 MG CAPS capsule Take 1 capsule (8 mg total) by mouth 2 (two) times daily. 04/07/23   McKenzie, Mardene Celeste, MD  trimethoprim-polymyxin b (POLYTRIM) ophthalmic solution Place 1 drop into both eyes every 6 (six) hours. 09/04/23   Particia Nearing, PA-C      Allergies    Avelox [moxifloxacin] and Penicillins    Review of Systems   Review of Systems  Constitutional:  Negative for fever.  Respiratory:  Negative for shortness of breath.   Cardiovascular:  Negative for chest pain.  Gastrointestinal:  Negative for abdominal pain, nausea and vomiting.   Genitourinary:  Negative for dysuria.  All other systems reviewed and are negative.   Physical Exam Updated Vital Signs BP (!) 148/86   Pulse 68   Temp 97.7 F (36.5 C) (Oral)   Resp 16   Ht 1.956 m (6\' 5" )   Wt 94.4 kg   SpO2 100%   BMI 24.68 kg/m  Physical Exam Vitals and nursing note reviewed.  Constitutional:      Appearance: He is well-developed. He is not ill-appearing.  HENT:     Head: Normocephalic and atraumatic.     Mouth/Throat:     Mouth: Mucous membranes are moist.  Eyes:     Pupils: Pupils are equal, round, and reactive to light.  Neck:     Comments: C6, C7 tenderness to palpation without step-off or deformity noted, normal range of motion Cardiovascular:     Rate and Rhythm: Normal rate and regular rhythm.     Heart sounds: Normal heart sounds. No murmur heard. Pulmonary:     Effort: Pulmonary effort is normal. No respiratory distress.     Breath sounds: Normal breath sounds. No wheezing.  Abdominal:     Palpations: Abdomen is soft.     Tenderness: There is no abdominal tenderness. There is no rebound.  Musculoskeletal:     Cervical back: Neck supple. Tenderness present.     Comments: Superficial abrasions bilateral knees, no obvious deformities, no significant swelling  Lymphadenopathy:     Cervical: No cervical adenopathy.  Skin:    General: Skin is warm and dry.  Neurological:     Mental Status: He is alert and oriented to person, place, and time.     Comments: 5 out of 5 strength in all 4 extremities  Psychiatric:        Mood and Affect: Mood normal.     ED Results / Procedures / Treatments   Labs (all labs ordered are listed, but only abnormal results are displayed) Labs Reviewed - No data to display  EKG None  Radiology DG Knee Complete 4 Views Left Result Date: 09/30/2023 CLINICAL DATA:  78 year old male with history of trauma from a fall. Left knee pain. EXAM: LEFT KNEE - COMPLETE 4+ VIEW COMPARISON:  No priors. FINDINGS: Four  views of the left knee demonstrate no definite acute displaced fracture, subluxation or dislocation. There is joint space narrowing, subchondral sclerosis, subchondral cyst formation and osteophyte formation in a tricompartmental distribution, indicative of osteoarthritis. Extensive chondrocalcinosis also noted in the medial and lateral compartments. IMPRESSION: 1. No acute radiographic abnormality of the left knee. 2. Tricompartmental osteoarthritis, as above. 3. 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 Electronically Signed   By: Trudie Reed M.D.   On: 09/30/2023 06:43   DG Knee Complete 4 Views Right Result Date: 09/30/2023 CLINICAL DATA:  78 year old male with history of trauma from a  fall complaining of right-sided knee pain. EXAM: RIGHT KNEE - COMPLETE 4+ VIEW COMPARISON:  None Available. FINDINGS: No evidence of fracture, dislocation, or joint effusion. Joint space narrowing, subchondral sclerosis, subchondral cyst formation and osteophyte formation is noted in a tricompartmental distribution, indicative of osteoarthritis. Chondrocalcinosis is noted in the medial and lateral compartments. IMPRESSION: 1. No acute radiographic abnormality of the right knee. 2. Tricompartmental osteoarthritis, as above. 3. Chondrocalcinosis in the medial and lateral compartments, which can be seen in the setting of CPPD. Electronically Signed   By: Trudie Reed M.D.   On: 09/30/2023 06:43   CT Head Wo Contrast Result Date: 09/30/2023 CLINICAL DATA:  Neck trauma.  Fall out of bed EXAM: CT HEAD WITHOUT CONTRAST CT CERVICAL SPINE WITHOUT CONTRAST TECHNIQUE: Multidetector CT imaging of the head and cervical spine was performed following the standard protocol without intravenous contrast. Multiplanar CT image reconstructions of the cervical spine were also generated. RADIATION DOSE REDUCTION: This exam was performed according to the departmental dose-optimization program which includes automated  exposure control, adjustment of the mA and/or kV according to patient size and/or use of iterative reconstruction technique. COMPARISON:  None Available. FINDINGS: CT HEAD FINDINGS Brain: No evidence of acute infarction, hemorrhage, hydrocephalus, extra-axial collection or mass lesion/mass effect. Low-density in the deep cerebral white matter attributed to chronic small vessel disease. Vascular: No hyperdense vessel or unexpected calcification. Skull: Normal. Negative for fracture or focal lesion. Sinuses/Orbits: No evidence of injury. CT CERVICAL SPINE FINDINGS Alignment: Normal Skull base and vertebrae: C7 right articular process and lamina fracture without displacement. Irregularity at the tip of the right inferior facet C5 has a spur like appearance on axial images. Soft tissues and spinal canal: No prevertebral fluid or swelling. No visible canal hematoma. Disc levels: Ordinary degenerative disc space narrowing and endplate ridging for age. Upper chest: Clear apical lungs. 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 Attempted call report, will re-attempt and message sent in epic chat. IMPRESSION: 1. Nondisplaced fracture through the C7 right articular process and lamina. 2. No evidence of intracranial injury. Electronically Signed   By: Tiburcio Pea M.D.   On: 09/30/2023 06:39   CT Cervical Spine Wo Contrast Result Date: 09/30/2023 CLINICAL DATA:  Neck trauma.  Fall out of bed EXAM: CT HEAD WITHOUT CONTRAST CT CERVICAL SPINE WITHOUT CONTRAST TECHNIQUE: Multidetector CT imaging of the head and cervical spine was performed following the standard protocol without intravenous contrast. Multiplanar CT image reconstructions of the cervical spine were also generated. RADIATION DOSE REDUCTION: This exam was performed according to the departmental dose-optimization program which includes automated exposure control, adjustment of the mA and/or kV according to patient size and/or use of iterative  reconstruction technique. COMPARISON:  None Available. FINDINGS: CT HEAD FINDINGS Brain: No evidence of acute infarction, hemorrhage, hydrocephalus, extra-axial collection or mass lesion/mass effect. Low-density in the deep cerebral white matter attributed to chronic small vessel disease. Vascular: No hyperdense vessel or unexpected calcification. Skull: Normal. Negative for fracture or focal lesion. Sinuses/Orbits: No evidence of injury. CT CERVICAL SPINE FINDINGS Alignment: Normal Skull base and vertebrae: C7 right articular process and lamina fracture without displacement. Irregularity at the tip of the right inferior facet C5 has a spur like appearance on axial images. Soft tissues and spinal canal: No prevertebral fluid or swelling. No visible canal hematoma. Disc levels: Ordinary degenerative disc space narrowing and endplate ridging for age. Upper chest: Clear apical lungs. 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 Attempted call report, will re-attempt  and message sent in epic chat. IMPRESSION: 1. Nondisplaced fracture through the C7 right articular process and lamina. 2. No evidence of intracranial injury. Electronically Signed   By: Tiburcio Pea M.D.   On: 09/30/2023 06:39    Procedures Procedures    Medications Ordered in ED Medications - No data to display  ED Course/ Medical Decision Making/ A&P Clinical Course as of 09/30/23 0753  Sat Sep 30, 2023  0723 Spoke to neurosurgery PA [CH]  [phone] Recommendation for hard collar in 2-week follow-up per Meyran, neurosurgery APP. [CH]    Clinical Course User Index [CH] Nabila Albarracin, Mayer Masker, MD                                 Medical Decision Making Amount and/or Complexity of Data Reviewed Radiology: ordered.  Risk Prescription drug management.   This patient presents to the ED for concern of fall, this involves an extensive number of treatment options, and is a complaint that carries with it a high risk of  complications and morbidity.  I considered the following differential and admission for this acute, potentially life threatening condition.  The differential diagnosis includes head injury, neck injury, long bone injury  MDM:    This is a 78 year old male who presents after falling out of bed while having a bad dream.  He is nontoxic and vital signs are reassuring.  He is neurologically intact.  He has some tenderness on the C-spine.  X-rays and CT abdominal.  He does have evidence of a C7 fracture from the articular process to the lamina.  This is nondisplaced.  He is placed in a hard collar.  Discussed with neurosurgery as above.  He is neurologically intact.  Recommend follow-up in 2 weeks and hard collar at all times.  Discussed with patient.  He is agreeable to plan.  Will send a short course of pain medication.  (Labs, imaging, consults)  Labs: I Ordered, and personally interpreted labs.  The pertinent results include: N/A  Imaging Studies ordered: I ordered imaging studies including CT, x-ray I independently visualized and interpreted imaging. I agree with the radiologist interpretation  Additional history obtained from chart review.  External records from outside source obtained and reviewed including prior evaluations  Cardiac Monitoring: The patient was maintained on a cardiac monitor.  If on the cardiac monitor, I personally viewed and interpreted the cardiac monitored which showed an underlying rhythm of: Sinus  Reevaluation: After the interventions noted above, I reevaluated the patient and found that they have :stayed the same  Social Determinants of Health:  lives independently  Disposition: Discharge  Co morbidities that complicate the patient evaluation  Past Medical History:  Diagnosis Date   Acute medial meniscus tear of left knee    Depression    Frequent PVCs    GERD (gastroesophageal reflux disease)    History of hiatal hernia    History of inguinal hernia     History of kidney stones    Hypertension    Hypothyroidism    Lung cancer (HCC)    NSVT (nonsustained ventricular tachycardia) (HCC)    Osteoarthritis    Sleep apnea    Thyroid disease      Medicines Meds ordered this encounter  Medications   oxyCODONE-acetaminophen (PERCOCET/ROXICET) 5-325 MG per tablet 1 tablet    Refill:  0   oxyCODONE-acetaminophen (PERCOCET/ROXICET) 5-325 MG tablet    Sig: Take 1 tablet by  mouth every 6 (six) hours as needed for severe pain (pain score 7-10).    Dispense:  10 tablet    Refill:  0    I have reviewed the patients home medicines and have made adjustments as needed  Problem List / ED Course: Problem List Items Addressed This Visit   None Visit Diagnoses       Fall, initial encounter    -  Primary     Other closed nondisplaced fracture of seventh cervical vertebra, initial encounter [facility])                       Final Clinical Impression(s) / ED Diagnoses Final diagnoses:  None    Rx / DC Orders ED Discharge Orders     None         Shon Baton, MD 09/30/23 2301

## 2023-09-30 NOTE — ED Triage Notes (Signed)
Pov from home. Cc of falling out of the bed this morning while having a bad dream C/o forehead, neck and bilat knee pain.

## 2023-09-30 NOTE — ED Notes (Signed)
Pt given extra pillow for comfort.

## 2023-09-30 NOTE — Discharge Instructions (Signed)
You were seen today and found to have a cervical spine fracture.  You need follow-up with neurosurgery in 2 weeks.  Keep hard collar in place at all times.  If you develop numbness, tingling, any neurologic symptoms, you should be reevaluated.

## 2023-09-30 NOTE — ED Notes (Signed)
Applied C-collar to pt neck.

## 2023-10-02 ENCOUNTER — Ambulatory Visit: Payer: Self-pay | Admitting: Licensed Clinical Social Worker

## 2023-10-02 NOTE — Patient Outreach (Signed)
  Care Coordination   Follow Up Visit Note   10/02/2023 Name: Micheal Baker MRN: [id number] DOB: [birth year]  Micheal Baker is a 78 y.o. year old male who sees Micheal Found, MD for primary care. I spoke with  Micheal Baker by phone today.  What matters to the patients health and wellness today?  Patient has stated that he has decreased energy occasionally    Goals Addressed             This Visit's Progress    Patient Stated he has decreased energy occasionally       Interventions:  Spoke with client via phone today about his current status and needs faced.  Client had accident recently in his home and has a neck injury. He is wearing a neck brace currently.  Spoke with client about pain issues of client. Client said he is taking a prescribed pain medication every 6 hours.  He called medical provider today to try to set up appointment with that medical provider regarding care for his neck injury He said he is trying to manage medical needs faced Discussed family support . He said he has support of his children Discussed food issues, meal provision. He said he is doing well with food. He has relatives that are bringing him food occasionally to help him Discussed sleeping issues. Discussed pain issues. Discussed transport. He drives himself on short trips or errands as needed Discussed nursing support with RN Micheal Baker. Micheal Loll RN is scheduled to call client for phone support on 10/12/23.  Client said he appreciates nursing support from RN Micheal Baker Provided counseling support for client Discussed program support with RN, LCSW, Pharmacist Thanked client for phone call with LCSW today Encouraged Amron to call LCSW as needed for SW support at [phone].             SDOH assessments and interventions completed:  Yes  SDOH Interventions Today    Flowsheet Row Most Recent Value  SDOH Interventions   Depression Interventions/Treatment  Counseling  Physical Activity  Interventions Other (Comments)  [has neck injury. Client is wearing a neck brace currently]  Stress Interventions Provide Counseling  [client has neck injury. He is wearing neck brace currently]        Care Coordination Interventions:  Yes, provided   Interventions Today    Flowsheet Row Most Recent Value  Chronic Disease   Chronic disease during today's visit Other  [spoke with client about client needs]  General Interventions   General Interventions Discussed/Reviewed General Interventions Discussed, Community Resources  Education Interventions   Education Provided Provided Education  Provided Verbal Education On [employer]  Mental Health Interventions   Mental Health Discussed/Reviewed Mental Health Discussed, Coping Strategies  [discussed coping skills of client]  Nutrition Interventions   Nutrition Discussed/Reviewed Nutrition Discussed  Pharmacy Interventions   Pharmacy Dicussed/Reviewed Pharmacy Topics Discussed  Safety Interventions   Safety Discussed/Reviewed Fall Risk        Follow up plan: Follow up call scheduled for 11/28/23 at 3:00 PM     Encounter Outcome:  Patient Visit Completed    Micheal Baker  MSW, LCSW [place] Licensed Clinical Social Worker Direct Dial:  [phone] Fax:  [phone] Website:  [email or web address]

## 2023-10-02 NOTE — Patient Instructions (Signed)
Visit Information  Thank you for taking time to visit with me today. Please don't hesitate to contact me if I can be of assistance to you.   Following are the goals we discussed today:   Goals Addressed             This Visit's Progress    Patient Stated he has decreased energy occasionally       Interventions:  Spoke with client via phone today about his current status and needs faced.  Client had accident recently in his home and has a neck injury. He is wearing a neck brace currently.  Spoke with client about pain issues of client. Client said he is taking a prescribed pain medication every 6 hours.  He called medical provider today to try to set up appointment with that medical provider regarding care for his neck injury He said he is trying to manage medical needs faced Discussed family support . He said he has support of his children Discussed food issues, meal provision. He said he is doing well with food. He has relatives that are bringing him food occasionally to help him Discussed sleeping issues. Discussed pain issues. Discussed transport. He drives himself on short trips or errands as needed Discussed nursing support with RN [name]. [name] RN is scheduled to call client for phone support on 10/12/23.  Client said he appreciates nursing support from RN [name] Provided counseling support for client Discussed program support with RN, LCSW, Pharmacist Thanked client for phone call with LCSW today Encouraged Basheer to call LCSW as needed for SW support at [phone].             Our next appointment is by telephone on 11/28/23 at 3:00 PM   Please call the care guide team at [phone] if you need to cancel or reschedule your appointment.   If you are experiencing a Mental Health or Behavioral Health Crisis or need someone to talk to, please go to [facility] [address] [place] [phone])   The patient  verbalized understanding of instructions, educational materials, and care plan provided today and DECLINED offer to receive copy of patient instructions, educational materials, and care plan.   The patient has been provided with contact information for the care management team and has been advised to call with any health related questions or concerns.    Lorna Few  MSW, LCSW [place] Licensed Clinical Social Worker Direct Dial:  [phone] Fax:  [phone] Website:  [email or web address]

## 2023-10-04 DIAGNOSIS — F4323 Adjustment disorder with mixed anxiety and depressed mood: Secondary | ICD-10-CM | POA: Diagnosis not present

## 2023-10-04 DIAGNOSIS — F411 Generalized anxiety disorder: Secondary | ICD-10-CM | POA: Diagnosis not present

## 2023-10-04 DIAGNOSIS — F331 Major depressive disorder, recurrent, moderate: Secondary | ICD-10-CM | POA: Diagnosis not present

## 2023-10-06 ENCOUNTER — Ambulatory Visit: Payer: Medicare PPO | Admitting: Urology

## 2023-10-06 VITALS — BP 138/77 | HR 85

## 2023-10-06 DIAGNOSIS — R351 Nocturia: Secondary | ICD-10-CM | POA: Diagnosis not present

## 2023-10-06 DIAGNOSIS — N138 Other obstructive and reflux uropathy: Secondary | ICD-10-CM

## 2023-10-06 DIAGNOSIS — R972 Elevated prostate specific antigen [PSA]: Secondary | ICD-10-CM | POA: Diagnosis not present

## 2023-10-06 DIAGNOSIS — N401 Enlarged prostate with lower urinary tract symptoms: Secondary | ICD-10-CM

## 2023-10-06 LAB — URINALYSIS, ROUTINE W REFLEX MICROSCOPIC
Bilirubin, UA: NEGATIVE
Glucose, UA: NEGATIVE
Leukocytes,UA: NEGATIVE
Nitrite, UA: NEGATIVE
Protein,UA: NEGATIVE
RBC, UA: NEGATIVE
Specific Gravity, UA: 1.02 (ref 1.005–1.030)
Urobilinogen, Ur: 1 mg/dL (ref 0.2–1.0)
pH, UA: 6 (ref 5.0–7.5)

## 2023-10-06 MED ORDER — FINASTERIDE 5 MG PO TABS: 5.0000 mg | ORAL_TABLET | Freq: Every day | ORAL | 3 refills | Status: DC

## 2023-10-06 MED ORDER — SILODOSIN 8 MG PO CAPS: 8.0000 mg | ORAL_CAPSULE | Freq: Two times a day (BID) | ORAL | 11 refills | Status: DC

## 2023-10-06 NOTE — Progress Notes (Unsigned)
10/06/2023 11:49 AM   Micheal Baker Jan 04, 1946 [id number]  Referring provider: [name], MD [address] [place],  [place] [address]  No chief complaint on file.   HPI: PSa decreased to 3.9. He was treated with 3 different antibiotics for pneumonia in the past 4 weeks. IPSS 19 QOL 3 on rapalfo 8mg  BID and finasteride.    PMH: Past Medical History:  Diagnosis Date   Acute medial meniscus tear of left knee    Depression    Frequent PVCs    GERD (gastroesophageal reflux disease)    History of hiatal hernia    History of inguinal hernia    History of kidney stones    Hypertension    Hypothyroidism    Lung cancer (HCC)    NSVT (nonsustained ventricular tachycardia) (HCC)    Osteoarthritis    Sleep apnea    Thyroid disease     Surgical History: Past Surgical History:  Procedure Laterality Date   BIOPSY  12/01/2022   Procedure: BIOPSY;  Surgeon: Lanelle Bal, DO;  [facility];  Service: Endoscopy;;   BRONCHIAL BIOPSY  02/07/2023   Procedure: BRONCHIAL BIOPSIES;  Surgeon: Josephine Igo, DO;  [facility];  Service: Pulmonary;;   BRONCHIAL NEEDLE ASPIRATION BIOPSY  02/07/2023   Procedure: BRONCHIAL NEEDLE ASPIRATION BIOPSIES;  Surgeon: Josephine Igo, DO;  [facility];  Service: Pulmonary;;   CATARACT EXTRACTION     left   COLONOSCOPY N/A 04/08/2016   Surgeon: West Bali, MD; one 6 mm polyp in the cecum removed, one 4 mm polyp in the descending colon removed, nonbleeding internal hemorrhoids.  Pathology with hyperplastic polyps.  Recommended colonoscopy in 10 years.   COLONOSCOPY  12/01/2022   COLONOSCOPY WITH PROPOFOL N/A 12/01/2022   Procedure: COLONOSCOPY WITH PROPOFOL;  Surgeon: Lanelle Bal, DO;  [facility];  Service: Endoscopy;  Laterality: N/A;  8:15 am, asa 3   endosocopy  12/01/2022   ESOPHAGOGASTRODUODENOSCOPY (EGD) WITH PROPOFOL N/A 12/01/2022   Procedure: ESOPHAGOGASTRODUODENOSCOPY (EGD)  WITH PROPOFOL;  Surgeon: Lanelle Bal, DO;  [facility];  Service: Endoscopy;  Laterality: N/A;   EYE SURGERY     left-scar tissue   FIDUCIAL MARKER PLACEMENT  02/07/2023   Procedure: FIDUCIAL MARKER PLACEMENT;  Surgeon: Josephine Igo, DO;  [facility];  Service: Pulmonary;;   groin surgery Right 2010   growth removed    KNEE ARTHROSCOPY     left   KNEE ARTHROSCOPY WITH LATERAL MENISECTOMY Right 03/11/2015   Procedure: RIGHT KNEE ARTHROSCOPY WITH MEDIALAND LATERAL MENISECTOMY;  Surgeon: Salvatore Marvel, MD;  Location: [facility];  Service: Orthopedics;  Laterality: Right;   KNEE ARTHROSCOPY WITH MEDIAL MENISECTOMY Left 06/27/2014   Procedure: LEFT KNEE ARTHROSCOPY WITH PARTIAL MEDIAL AND LATERAL MENISECTOMIES AND CHONDROPLASTY;  Surgeon: Nilda Simmer, MD;  Location: [facility];  Service: Orthopedics;  Laterality: Left;   KNEE ARTHROSCOPY WITH MEDIAL MENISECTOMY Right 03/11/2015   Procedure: KNEE ARTHROSCOPY WITH MEDIAL MENISECTOMY;  Surgeon: Salvatore Marvel, MD;  Location: [facility];  Service: Orthopedics;  Laterality: Right;   LEFT HEART CATHETERIZATION WITH CORONARY ANGIOGRAM N/A 09/14/2011   Procedure: LEFT HEART CATHETERIZATION WITH CORONARY ANGIOGRAM;  Surgeon: [name], MD;  Location: [facility];  Service: Cardiovascular;  Laterality: N/A;   LYMPH NODE DISSECTION Left 03/22/2023   Procedure: LYMPH NODE DISSECTION;  Surgeon: Corliss Skains, MD;  [facility];  Service: Thoracic;  Laterality: Left;   POLYPECTOMY  04/08/2016   Procedure: POLYPECTOMY;  Surgeon: West Bali, MD;  [facility];  Service: Endoscopy;;  colon    POLYPECTOMY  12/01/2022   POLYPECTOMY  12/01/2022   Procedure: POLYPECTOMY;  Surgeon: Lanelle Bal, DO;  [facility];  Service: Endoscopy;;   RETINAL DETACHMENT SURGERY Left 2002   Dr. Hermina Barters    Home Medications:  Allergies as of 10/06/2023        Reactions   Avelox [moxifloxacin] Other (See Comments)   Caused tendonitis   Penicillins Other (See Comments)   Chills        Medication List        Accurate as of October 06, 2023 11:49 AM. If you have any questions, ask your nurse or doctor.          acetaminophen 500 MG tablet Commonly known as: TYLENOL Take 500-1,000 mg by mouth every 6 (six) hours as needed for mild pain, headache or moderate pain.   ALPHA LIPOIC ACID PO Take 1 capsule by mouth in the morning.   azithromycin 250 MG tablet Commonly known as: ZITHROMAX Take 250 mg by mouth daily. 2 tablets for first day and 1 tablet the next 4 days   B-complex with vitamin C tablet Take 1 tablet by mouth in the morning.   benzonatate 100 MG capsule Commonly known as: TESSALON Take 1 capsule (100 mg total) by mouth 3 (three) times daily as needed for cough. Do not take with alcohol or while driving or operating heavy machinery.  May cause drowsiness.   buPROPion 150 MG 24 hr tablet Commonly known as: WELLBUTRIN XL Take 150 mg by mouth daily.   calcium carbonate 500 MG chewable tablet Commonly known as: TUMS - dosed in mg elemental calcium Chew 1 tablet (200 mg of elemental calcium total) by mouth 3 (three) times daily with meals. What changed:  when to take this reasons to take this   chlorpheniramine-HYDROcodone 10-8 MG/5ML Commonly known as: TUSSIONEX Take 5 mLs by mouth every 12 (twelve) hours as needed for cough.   doxycycline 100 MG tablet Commonly known as: VIBRA-TABS Take 1 tablet (100 mg total) by mouth 2 (two) times daily.   doxylamine (Sleep) 25 MG tablet Commonly known as: UNISOM Take 25 mg by mouth at bedtime as needed for sleep.   escitalopram 20 MG tablet Commonly known as: LEXAPRO Take 20 mg by mouth daily.   finasteride 5 MG tablet Commonly known as: PROSCAR Take 1 tablet (5 mg total) by mouth daily.   fluticasone 50 MCG/ACT nasal spray Commonly known as: FLONASE Place 1  spray into both nostrils daily as needed for allergies.   levothyroxine 88 MCG tablet Commonly known as: Synthroid Take 1 tablet (88 mcg total) by mouth daily before breakfast.   oxyCODONE-acetaminophen 5-325 MG tablet Commonly known as: PERCOCET/ROXICET Take 1 tablet by mouth every 6 (six) hours as needed for severe pain (pain score 7-10).   pantoprazole 40 MG tablet Commonly known as: PROTONIX TAKE ONE TABLET BY MOUTH ONCE DAILY.   silodosin 8 MG Caps capsule Commonly known as: RAPAFLO Take 1 capsule (8 mg total) by mouth 2 (two) times daily.   Systane Complete 0.6 % Soln Generic drug: Propylene Glycol Place 1 drop into both eyes as needed (dry eyes).   trimethoprim-polymyxin b ophthalmic solution Commonly known as: Polytrim Place 1 drop into both eyes every 6 (six) hours.   Voltaren 1 % Gel Generic drug: diclofenac  Sodium Apply 1 Application topically 4 (four) times daily as needed (knee pain).        Allergies:  Allergies  Allergen Reactions   Avelox [Moxifloxacin] Other (See Comments)    Caused tendonitis   Penicillins Other (See Comments)    Chills     Family History: Family History  Problem Relation Age of Onset   Hypertension Mother    Hypertension Father    Heart attack Father    Diabetes Father    Asthma Daughter    Colon cancer Neg Hx    Stomach cancer Neg Hx     Social History:  reports that he has never smoked. He has never used smokeless tobacco. He reports current alcohol use. He reports that he does not use drugs.  ROS: All other review of systems were reviewed and are negative except what is noted above in HPI  Physical Exam: BP 138/77   Pulse 85   Constitutional:  Alert and oriented, No acute distress. HEENT: [place] AT, moist mucus membranes.  Trachea midline, no masses. Cardiovascular: No clubbing, cyanosis, or edema. Respiratory: Normal respiratory effort, no increased work of breathing. GI: Abdomen is soft, nontender, nondistended,  no abdominal masses GU: No CVA tenderness.  Lymph: No cervical or inguinal lymphadenopathy. Skin: No rashes, bruises or suspicious lesions. Neurologic: Grossly intact, no focal deficits, moving all 4 extremities. Psychiatric: Normal mood and affect.  Laboratory Data: Lab Results  Component Value Date   WBC 6.2 09/26/2023   HGB 14.5 09/26/2023   HCT 41.7 09/26/2023   MCV 85.3 09/26/2023   PLT 216 09/26/2023    Lab Results  Component Value Date   CREATININE 1.03 09/26/2023    No results found for: "PSA"  No results found for: "TESTOSTERONE"  No results found for: "HGBA1C"  Urinalysis    Component Value Date/Time   COLORURINE YELLOW 03/20/2023 1337   APPEARANCEUR Clear 04/07/2023 1150   LABSPEC 1.006 03/20/2023 1337   PHURINE 6.0 03/20/2023 1337   GLUCOSEU Negative 04/07/2023 1150   HGBUR NEGATIVE 03/20/2023 1337   BILIRUBINUR Negative 04/07/2023 1150   KETONESUR NEGATIVE 03/20/2023 1337   PROTEINUR Negative 04/07/2023 1150   PROTEINUR NEGATIVE 03/20/2023 1337   UROBILINOGEN 0.2 08/08/2011 0800   NITRITE Negative 04/07/2023 1150   NITRITE NEGATIVE 03/20/2023 1337   LEUKOCYTESUR Negative 04/07/2023 1150   LEUKOCYTESUR NEGATIVE 03/20/2023 1337    Lab Results  Component Value Date   LABMICR Comment 04/07/2023   WBCUA 0-5 02/10/2023   LABEPIT 0-10 02/10/2023   MUCUS Present 02/15/2022   BACTERIA None seen 02/10/2023    Pertinent Imaging: *** No results found for this or any previous visit.  No results found for this or any previous visit.  No results found for this or any previous visit.  No results found for this or any previous visit.  No results found for this or any previous visit.  No results found for this or any previous visit.  No results found for this or any previous visit.  Results for orders placed during the hospital encounter of 01/30/22  CT Renal Stone Study  Narrative CLINICAL DATA:  Flank pain.  EXAM: CT ABDOMEN AND PELVIS  WITHOUT CONTRAST  TECHNIQUE: Multidetector CT imaging of the abdomen and pelvis was performed following the standard protocol without IV contrast.  RADIATION DOSE REDUCTION: This exam was performed according to the departmental dose-optimization program which includes automated exposure control, adjustment of the mA and/or kV according to patient size and/or use of iterative  reconstruction technique.  COMPARISON:  CT abdomen and pelvis 01/18/2007  FINDINGS: Lower chest: There is a new 6 mm nodule in the left lung base. Lung bases are otherwise clear. There are calcified granulomas in the lung bases.  Hepatobiliary: Calcified granulomas are present. There is a 3 cm cyst in the inferior right lobe of the liver which has slightly increased in size compared to the prior study. Gallbladder and bile ducts are within normal limits.  Pancreas: Unremarkable. No pancreatic ductal dilatation or surrounding inflammatory changes.  Spleen: Calcified granulomas are present. Otherwise within normal limits.  Adrenals/Urinary Tract: Foley catheter is seen within the bladder. No urinary tract calculus or hydronephrosis. Adrenal glands within normal limits.  Stomach/Bowel: Stomach is within normal limits. Appendix appears normal. No evidence of bowel wall thickening, distention, or inflammatory changes. Appendix is not seen. Small hiatal hernia and duodenal diverticulum again noted.  Vascular/Lymphatic: Aortic atherosclerosis. No enlarged abdominal or pelvic lymph nodes.  Reproductive: Prostate gland is enlarged and lobulated with nodular protrusion into the bladder base. Prostate measures 7.0 x 7.3 by 5.9 cm and has significantly increased in size.  Other: No ascites.  Small fat containing inguinal hernias.  Musculoskeletal: No acute or significant osseous findings.  IMPRESSION: 1. No evidence for urinary tract calculus or hydronephrosis. 2. Foley catheter in the bladder. 3. Marked  enlargement of the prostate gland with nodular protrusion into the bladder base, new from 2008. Recommend clinical correlation and follow-up to exclude neoplasm. 4. Hepatic cyst. 5. Old granulomatous disease. 6. 6 mm left solid pulmonary nodule. Recommend a non-contrast Chest CT at 6-12 months. If patient is high risk for malignancy, recommend an additional non-contrast Chest CT at 18-24 months; if patient is low risk for malignancy a non-contrast Chest CT at 18-24 months is optional. These guidelines do not apply to immunocompromised patients and patients with cancer. Follow up in patients with significant comorbidities as clinically warranted. For lung cancer screening, adhere to Lung-RADS guidelines. Reference: Radiology. 2017; 284(1):228-43.   Electronically Signed By: Darliss Cheney M.D. On: 01/30/2022 16:15   Assessment & Plan:    1. Elevated PSA (Primary) *** - Urinalysis, Routine w reflex microscopic  2. Benign prostatic hyperplasia with urinary obstruction ***  3. Nocturia ***   No follow-ups on file.  Wilkie Aye, MD  [facility] [place]

## 2023-10-09 ENCOUNTER — Ambulatory Visit: Payer: Medicare PPO | Admitting: Podiatry

## 2023-10-09 ENCOUNTER — Encounter: Payer: Self-pay | Admitting: Podiatry

## 2023-10-09 DIAGNOSIS — M722 Plantar fascial fibromatosis: Secondary | ICD-10-CM | POA: Diagnosis not present

## 2023-10-09 MED ORDER — TRIAMCINOLONE ACETONIDE 40 MG/ML IJ SUSP
40.0000 mg | Freq: Once | INTRAMUSCULAR | Status: AC
  Administered 2023-10-09: 40 mg

## 2023-10-09 MED ORDER — MELOXICAM 15 MG PO TABS: 15.0000 mg | ORAL_TABLET | Freq: Every day | ORAL | 3 refills | Status: DC

## 2023-10-09 NOTE — Progress Notes (Signed)
He presents after having not seen him for a couple of years with a chief complaint of bilateral plantar fascial flareup.  He presents today wearing a cervical brace.  States that he fell out of bed hitting his head and his toes on the floor while dreaming.  States that this fracture does cervical vertebrae #7.  Objective: Vital signs are stable he is alert and oriented x 3 has pain on palpation medial calcaneal tubercles bilateral.  Pulses remain palpable.  Assessment: Pain in limb secondary to plantar fasciitis recurrence.  Plan: Injected bilateral heels today 5 mg Kenalog 5 mg Marcaine point of maximal tenderness bilaterally.  Did not start him on any steroid or anti-inflammatories such as Medrol.  Did provide him with meloxicam 15 mg 1 p.o. daily.  Would like to follow-up with him in a month if he is not improving.  He is to follow-up with Dr. Marikay Alar in 1 to 2 weeks.

## 2023-10-10 ENCOUNTER — Encounter: Payer: Self-pay | Admitting: Urology

## 2023-10-10 NOTE — Patient Instructions (Signed)

## 2023-10-12 ENCOUNTER — Encounter: Payer: Self-pay | Admitting: *Deleted

## 2023-10-16 DIAGNOSIS — F4323 Adjustment disorder with mixed anxiety and depressed mood: Secondary | ICD-10-CM | POA: Diagnosis not present

## 2023-10-16 DIAGNOSIS — F331 Major depressive disorder, recurrent, moderate: Secondary | ICD-10-CM | POA: Diagnosis not present

## 2023-10-16 DIAGNOSIS — F411 Generalized anxiety disorder: Secondary | ICD-10-CM | POA: Diagnosis not present

## 2023-10-18 ENCOUNTER — Ambulatory Visit: Payer: Medicare PPO | Admitting: Pulmonary Disease

## 2023-10-19 ENCOUNTER — Other Ambulatory Visit: Payer: Medicare PPO

## 2023-10-19 DIAGNOSIS — S12690A Other displaced fracture of seventh cervical vertebra, initial encounter for closed fracture: Secondary | ICD-10-CM | POA: Diagnosis not present

## 2023-10-23 ENCOUNTER — Ambulatory Visit: Payer: Self-pay | Admitting: *Deleted

## 2023-10-23 NOTE — Patient Outreach (Signed)
 Care Coordination   Follow Up Visit Note   10/23/2023 Name: Micheal Baker MRN: [id number] DOB: 1946-01-16  Micheal Baker is a 78 y.o. year old male who sees Micheal Amel, MD for primary care. I spoke with  Micheal Baker by phone today.  What matters to the patients health and wellness today?  Managing cough/cold symptoms and C7 vertebral fracture.    Goals Addressed             This Visit's Progress    Follow-up with Oncologist Regarding Lung Nodule       Care Management Goal: Patient will follow-up with oncologist and repeat CT chest in 6 months as recommended Patient will reach out to provider with any new, persistent, or worsening URI symptoms Patient will treat symptoms as instructed during previous URI infection  Patient will increase fluid intake Patient will reach out to RN Care Manager at [phone] with any resource or care management needs     Manage C7 Vertebral Fracture       Care Management Goals: Patient will wear neck brace as instructed Patient will keep follow-up appointment with spinal surgeon Patient will practice fall precautions Patient will reach out to provider with any new or worsening symptoms Patient will reach out to RN Care Manager at [phone] with any resource or care management needs        SDOH assessments and interventions completed:  Yes  SDOH Interventions Today    Flowsheet Row Most Recent Value  SDOH Interventions   Transportation Interventions Intervention Not Indicated        Care Coordination Interventions:  Yes, provided  Interventions Today    Flowsheet Row Most Recent Value  Chronic Disease   Chronic disease during today's visit Other  [H/O lung cancer, current URI symptoms, recent H/O pneumonia, C7 vertebral fracture]  General Interventions   General Interventions Discussed/Reviewed General Interventions Discussed, General Interventions Reviewed, Labs, Doctor Visits, Health Screening  [reviewed imaging results from  ED visit on 09/30/23. Reviewed and discussed ED visit notes. Reviewed and discussed oncology notes from 09/29/23 and follow-up recommendations.]  Labs --  [facility], PSA]  Doctor Visits Discussed/Reviewed Doctor Visits Discussed, Doctor Visits Reviewed, Specialist, PCP  Health Screening Prostate  [reviewed and discussed urology visit on 1/24 and PSA results]  PCP/Specialist Visits Compliance with follow-up visit  Exercise Interventions   Exercise Discussed/Reviewed Physical Activity, Exercise Discussed, Exercise Reviewed  Physical Activity Discussed/Reviewed Physical Activity Discussed, Physical Activity Reviewed, Types of exercise, Home Exercise Program (HEP)  [patient is exercising at home and walking but being careful to not further injur his neck. He has discussed his limitations with the spinal surgeon.]  Education Interventions   Education Provided Provided Education  Provided Verbal Education On Nutrition, Medication, When to see the doctor, Mental Health/Coping with Illness  [follow-up with provider with any persistent, new, or worsening URI symptoms.]  Mental Health Interventions   Mental Health Discussed/Reviewed Mental Health Discussed, Mental Health Reviewed  [Managing well. Has a positive outlook. connected with LCSW.]  Nutrition Interventions   Nutrition Discussed/Reviewed Nutrition Discussed, Nutrition Reviewed, Fluid intake, Adding fruits and vegetables, Portion sizes  [patient is following a healthy, balanced diet]  Pharmacy Interventions   Pharmacy Dicussed/Reviewed Pharmacy Topics Discussed, Pharmacy Topics Reviewed, Medications and their functions  [taking medication as prescribed. Tylenol  as needed for neck pain.]  Safety Interventions   Safety Discussed/Reviewed Fall Risk, Safety Reviewed, Safety Discussed  [fell out of bed which causes a C7 vertebral fracture. No difficulty with  walking. Some difficulty with balance at times. Encouraged to move carefully and change positions  slowly.]       Follow up plan: Follow up call scheduled for 10/30/23    Encounter Outcome:  Patient Visit Completed   Micheal Ahle, RN, BSN [place]  [facility], [facility] RN Care Manager Direct Dial: [phone]

## 2023-10-24 DIAGNOSIS — Z20828 Contact with and (suspected) exposure to other viral communicable diseases: Secondary | ICD-10-CM | POA: Diagnosis not present

## 2023-10-24 DIAGNOSIS — R6889 Other general symptoms and signs: Secondary | ICD-10-CM | POA: Diagnosis not present

## 2023-10-24 DIAGNOSIS — J069 Acute upper respiratory infection, unspecified: Secondary | ICD-10-CM | POA: Diagnosis not present

## 2023-10-24 DIAGNOSIS — Z6824 Body mass index (BMI) 24.0-24.9, adult: Secondary | ICD-10-CM | POA: Diagnosis not present

## 2023-10-25 DIAGNOSIS — Z20828 Contact with and (suspected) exposure to other viral communicable diseases: Secondary | ICD-10-CM | POA: Diagnosis not present

## 2023-10-25 DIAGNOSIS — J09X2 Influenza due to identified novel influenza A virus with other respiratory manifestations: Secondary | ICD-10-CM | POA: Diagnosis not present

## 2023-10-26 ENCOUNTER — Ambulatory Visit: Payer: Medicare PPO | Admitting: Internal Medicine

## 2023-10-30 ENCOUNTER — Ambulatory Visit: Payer: Self-pay | Admitting: *Deleted

## 2023-10-30 ENCOUNTER — Telehealth: Payer: Self-pay | Admitting: Internal Medicine

## 2023-10-30 ENCOUNTER — Encounter: Payer: Self-pay | Admitting: *Deleted

## 2023-10-30 NOTE — Telephone Encounter (Signed)
 [facility] [place] Clinical Research Coordinator note:   This visit for Subject Micheal Baker with DOB: Nov 14, 1945 on 10/30/2023. Subject contacted regarding [id number] to inform that Principal Investigator has changed to Dr. Delton Coombes. Patient expressed understanding.

## 2023-10-30 NOTE — Patient Outreach (Addendum)
 Care Coordination   Follow Up Visit Note   10/30/2023 Name: Micheal Baker MRN: [id number] DOB: Oct 19, 1945  Micheal Baker is a 78 y.o. year old male who sees [name], MD for primary care. I spoke with  [name] by phone today.  What matters to the patients health and wellness today?  Getting over the flu, preventing pneumonia, and talking with someone about health related stress/depression.    Goals Addressed             This Visit's Progress    Follow-up with Oncologist Regarding Lung Nodule   On track    Care Management Goal: Patient will follow-up with oncologist and repeat CT chest in 6 months as recommended Patient will increase fluid intake Patient will talk with LCSW and/or other mental health professional regarding health related stress/depression Patient will reach out to RN Care Manager at [phone] with any resource or care management needs     Manage C7 Vertebral Fracture   On track    Care Management Goals: Patient will wear neck brace as instructed Patient will keep follow-up appointment with spinal surgeon Patient will practice fall precautions Patient will reach out to provider with any new or worsening symptoms Patient will reach out to RN Care Manager at [phone] with any resource or care management needs     Resolve Flu Symptoms       Care Management Goals: Patient will take medication as prescribed Patient will do deep breathing exercises every hour Patient will remain active but rest as needed. He will not overexert himself.  Patient will drink plenty of liquids to help think secretions Patient will reach out to provider with any new, persistent, or worsening symptoms and will seek emergency medical attention if needed Patient will reach out to RN Care Manager at [phone] with any resource or care management needs        SDOH assessments and interventions completed:  No  Care Coordination Interventions:  Yes, provided   Interventions Today    Flowsheet Row Most Recent Value  Chronic Disease   Chronic disease during today's visit --  [H/O lung cancer, Current Flu diagnosis, recent H/O pneumonia, C7 vertebral fracture]  General Interventions   General Interventions Discussed/Reviewed General Interventions Discussed, General Interventions Reviewed, Durable Medical Equipment (DME), Doctor Visits, Communication with  Doctor Visits Discussed/Reviewed Doctor Visits Discussed, Doctor Visits Reviewed, Specialist, PCP  PCP/Specialist Visits Compliance with follow-up visit  [Follow-up with PCP as directed and sooner if necessary.]  Communication with Social Work  [employer] message to [employer], LCSW regarding patient needs]  Exercise Interventions   Exercise Discussed/Reviewed Physical Activity, Exercise Reviewed, Exercise Discussed  Physical Activity Discussed/Reviewed Physical Activity Discussed, Physical Activity Reviewed  [Encouraged to remain active but to rest as needed and to not overexert himself]  Education Interventions   Education Provided Provided Education  Provided Verbal Education On Mental Health/Coping with Illness, Nutrition, Exercise, Medication, When to see the doctor  Mental Health Interventions   Mental Health Discussed/Reviewed Mental Health Discussed, Mental Health Reviewed, Depression, Refer to Social Work for counseling  [Also provided with a few outpatient behavioral health provider office telephone numbers per patient's request]  Refer to Social Work for counseling regarding Anxiety/Coping, Depression  [health realted stress/depression. Patient has been sick with multiple respiratory conditions over the past few months and is feeling down.]  Nutrition Interventions   Nutrition Discussed/Reviewed Nutrition Discussed, Nutrition Reviewed, Fluid intake, Adding fruits and vegetables, Supplemental nutrition, Increasing proteins  [decreased  appetite since having the Flu. Encouraged to increase  fluid intake, drink protein shakes, and eat things that are appetizing and that he can tolerate until he is well.]  Pharmacy Interventions   Pharmacy Dicussed/Reviewed Pharmacy Topics Discussed, Pharmacy Topics Reviewed, Medications and their functions  [taking medication as prescribed]  Safety Interventions   Safety Discussed/Reviewed Safety Discussed, Safety Reviewed       Follow up plan: Follow up call scheduled for 11/06/23    Encounter Outcome:  Patient Visit Completed   [name], RN, BSN [place]  [facility], [facility] RN Care Manager Direct Dial: [phone]

## 2023-10-31 ENCOUNTER — Other Ambulatory Visit (HOSPITAL_COMMUNITY): Payer: Self-pay | Admitting: Internal Medicine

## 2023-10-31 ENCOUNTER — Ambulatory Visit (HOSPITAL_COMMUNITY)
Admission: RE | Admit: 2023-10-31 | Discharge: 2023-10-31 | Disposition: A | Discharge status: Home / Self Care | Payer: Medicare PPO | Source: Ambulatory Visit | Attending: Internal Medicine | Admitting: Internal Medicine

## 2023-10-31 DIAGNOSIS — R6889 Other general symptoms and signs: Secondary | ICD-10-CM | POA: Diagnosis not present

## 2023-10-31 DIAGNOSIS — J189 Pneumonia, unspecified organism: Secondary | ICD-10-CM | POA: Diagnosis not present

## 2023-10-31 DIAGNOSIS — I10 Essential (primary) hypertension: Secondary | ICD-10-CM | POA: Diagnosis not present

## 2023-10-31 DIAGNOSIS — R051 Acute cough: Secondary | ICD-10-CM | POA: Insufficient documentation

## 2023-10-31 DIAGNOSIS — J09X2 Influenza due to identified novel influenza A virus with other respiratory manifestations: Secondary | ICD-10-CM | POA: Diagnosis not present

## 2023-10-31 DIAGNOSIS — Z6823 Body mass index (BMI) 23.0-23.9, adult: Secondary | ICD-10-CM | POA: Diagnosis not present

## 2023-11-01 ENCOUNTER — Ambulatory Visit: Payer: Self-pay | Admitting: *Deleted

## 2023-11-01 ENCOUNTER — Encounter: Payer: Self-pay | Admitting: *Deleted

## 2023-11-01 NOTE — Patient Instructions (Signed)
 Visit Information  Thank you for taking time to visit with me today. Please don't hesitate to contact me if I can be of assistance to you.   Following are the goals we discussed today:   Goals Addressed               This Visit's Progress     Receive Counseling & Supportive Services to Reduce & Manage Symptoms of Depression. (pt-stated)   On track     Care Coordination Interventions:  Interventions Today    Flowsheet Row Most Recent Value  Chronic Disease   Chronic disease during today's visit Other, Hypertension (HTN)  [H/O Lung Cancer, Current Flu Diagnosis, H/O Pneumonia, C7 Vertebral Fracture, Anxiety, Depression, Amnesia, Chronic Leg Pain, Limited Family Support.]  General Interventions   General Interventions Discussed/Reviewed General Interventions Discussed, Labs, Vaccines, Doctor Visits, Health Screening, Annual Foot Exam, Lipid Profile, General Interventions Reviewed, Annual Eye Exam, Durable Medical Equipment (DME), [employer], Communication with, Level of Care  [Encouraged Routine Engagement with Care Team Members & Providers.]  Labs Hgb A1c every 3 months, Kidney Function, Hgb A1c annually  [Encouraged Routine Lab Work.]  Vaccines COVID-19, Flu, Pneumonia, RSV, Shingles, Tetanus/Pertussis/Diphtheria  [Encouraged Annual Vaccinations.]  Doctor Visits Discussed/Reviewed Doctor Visits Discussed, Specialist, Doctor Visits Reviewed, Annual Wellness Visits, PCP  [Encouraged Routine Engagement with Care Team Members & Providers.]  Health Screening Bone Density, Colonoscopy, Prostate  [Encouraged Routine Health Screenings.]  Durable Medical Equipment (DME) Other, BP Cuff  [Eyeglasses & Cane.]  PCP/Specialist Visits Compliance with follow-up visit  [Encouraged Routine Engagement with Care Team Members & Providers.]  Communication with PCP/Specialists, RN, Pharmacists, Social Work  [employer] Routine Engagement with Care Team Members & Providers.]  Level of Care Adult  Daycare, Air traffic controller, Assisted Living, Skilled Nursing Facility, Personal Care Services  [Confirmed Disinterest in Enrollment in Adult Day Care Program. Confirmed Disinterest in Pursuing Higher Level of Care Placement Options. Confirmed Disinterest in Applying for Personal Care Services.]  Applications Medicaid, Personal Care Services  [Confirmed Ineligibility for Medicaid. Confirmed Ineligibility for Personal Care Services.]  Exercise Interventions   Exercise Discussed/Reviewed Exercise Discussed, Assistive device use and maintanence, Exercise Reviewed, Physical Activity, Weight Managment  [Encouraged Daily Exercise Regimen, as Tolerated.]  Physical Activity Discussed/Reviewed Physical Activity Discussed, Home Exercise Program (HEP), PREP, Physical Activity Reviewed, Gym, Types of exercise  [Encouraged Increased Level of Exercise, Inside & Outside the Home. Encouraged Increased Involvement in Activities of Interest.]  Weight Management Weight loss  [Encouraged Healthy Weight Loss Regimen.]  Education Interventions   Education Provided Provided Therapist, sports, Provided Web-based Education, Provided Education  [employer] Material & Thoroughly Reviewed to [employer] & Entertain Questions.]  Provided Verbal Education On Nutrition, Mental Health/Coping with Illness, When to see the doctor, Foot Care, Eye Care, Labs, Blood Sugar Monitoring, Applications, [employer], Exercise, Development worker, community, Medication  [Encouraged Continued Independent Review of Educational Material Provided.]  Ship broker, Personal Care Services  [Confirmed Ineligibility for [employer]. Confirmed Ineligibility for Personal Care Services.]  Mental Health Interventions   Mental Health Discussed/Reviewed Mental Health Discussed, Anxiety, Depression, Mental Health Reviewed, Grief and Loss, Substance Abuse, Coping Strategies, Suicide, Crisis, Other  [Assessed Mental Health & Cognitive Status. Offered  Counseling, Supportive Services, & Resources.]  Nutrition Interventions   Nutrition Discussed/Reviewed Nutrition Discussed, Adding fruits and vegetables, Increasing proteins, Nutrition Reviewed, Fluid intake, Decreasing fats, Decreasing salt, Portion sizes, Carbohydrate meal planning, Decreasing sugar intake, Supplemental nutrition  [Encouraged Heart-Healthy, Low Sodium, Fiber-Rich, Reduced Fat, Low Sugar Diet.]  Pharmacy Interventions  Pharmacy Dicussed/Reviewed Pharmacy Topics Discussed, Medications and their functions, Medication Adherence, Pharmacy Topics Reviewed, Affording Medications  [Confirmed Ability to Afford Prescription Medications.]  Medication Adherence --  [Confirmed Compliance with Prescription Medications.]  Safety Interventions   Safety Discussed/Reviewed Safety Discussed, Fall Risk, Safety Reviewed, Home Safety  [Encouraged Routine Use of Assistive Devices & Durable Medical Equipment.]  Home Safety Assistive Devices, Need for home safety assessment  [Encouraged Consideration of Home Safety Evaluation.]  Advanced Directive Interventions   Advanced Directives Discussed/Reviewed Advanced Directives Discussed, Advanced Directives Reviewed  [Confirmed Initiation of Advanced Directives (Living Will & Healthcare Power of Attorney Documents). Copies Requested to Scan into Electronic Medical Record in Epic.]      Assessed Social Determinant of Health Barriers. Discussed Plans for Ongoing Care Management Follow Up. Provided Careers information officer Information for Care Management Team Members. Screened for Signs & Symptoms of Depression, Related to Chronic Disease State.  PHQ2 & PHQ9 Depression Screen Completed & Results Reviewed.  Suicidal Ideation & Homicidal Ideation Assessed - None Present.   Domestic Violence Assessed - None Present. Access to Weapons Assessed - None Present.   Active Listening & Reflection Utilized.  Verbalization of Feelings Encouraged.  Emotional Support  Provided. Feelings of Sadness & Hopelessness Validated. Stress & Fatigue Acknowledged. Self-Enrollment in Support Group of Interest Emphasized, from List Provided. Crisis Support Information, Agencies, Services, & Resources Discussed. Problem Solving Interventions Identified. Task-Centered Solutions Implemented.   Solution-Focused Strategies Developed. Acceptance & Commitment Therapy Introduced. Brief Cognitive Behavioral Therapy Initiated. Client-Centered Therapy Enacted. Reviewed Prescription Medications & Discussed Importance of Compliance. Quality of Sleep Assessed & Sleep Hygiene Techniques Promoted. Discussed Higher Level of Care Placement Options (I.e. Memory Care, Assisted Living, Extended Care, Skilled Nursing, Etc.) & Encouraged Consideration. Verified No In-Home Care Services, [employer], Warden/ranger, Etc., Covered Under Firefighter through [employer].   Verified No Long-Term Care Insurance Benefits, Secondary Insurance Policies, Plans, Coverage, Etc.  Confirmed Neither Patient Nor Divorced Wife Were Veterans, Making Him Ineligible to Apply for Aid & Attendance Benefits, Through [employer]. Encouraged Routine Engagement with Danford Bad, Licensed Clinical Social Worker with [facility], [facility] [phone]), if You Have Questions, Need Assistance, or If Additional Social Work Needs Are Identified Between Now & Our Next Follow-Up Outreach Call, Scheduled on 11/15/2023 at 12:30 PM.      Our next appointment is by telephone on 11/15/2023 at 12:30 pm.  Please call the care guide team at [phone] if you need to cancel or reschedule your appointment.   If you are experiencing a Mental Health or Behavioral Health Crisis or need someone to talk to, please call the Suicide and Crisis Lifeline: 988 call the [place] National Suicide Prevention Lifeline: 530-820-0843 or TTY:  873-862-4696 TTY 310-823-1388) to talk to a trained counselor call 1-800-273-TALK (toll free, 24 hour hotline) go to [facility] [address] [place] [phone]) call the [facility] Crisis Line: [phone] call 911  Patient verbalizes understanding of instructions and care plan provided today and agrees to view in MyChart. Active MyChart status and patient understanding of how to access instructions and care plan via MyChart confirmed with patient.     Telephone follow up appointment with care management team member scheduled for:  11/15/2023 at 12:30 pm.   Danford Bad, BSW, MSW, LCSW [place]  [facility], [facility] Clinical Social Worker II Direct Dial: [phone]  Fax: [phone] Website: [email or web address]

## 2023-11-01 NOTE — Patient Outreach (Signed)
 Care Coordination   Initial Visit Note   11/01/2023  Name: Micheal Baker MRN: [id number] DOB: 06/26/1946  Micheal Baker is a 78 y.o. year old male who sees [name], MD for primary care. I spoke with [name] by phone today.  What matters to the patients health and wellness today?  Receive Counseling & Supportive Services to Reduce & Manage Symptoms of Depression.    Goals Addressed               This Visit's Progress     Receive Counseling & Supportive Services to Reduce & Manage Symptoms of Depression. (pt-stated)   On track     Care Coordination Interventions:  Interventions Today    Flowsheet Row Most Recent Value  Chronic Disease   Chronic disease during today's visit Other, Hypertension (HTN)  [H/O Lung Cancer, Current Flu Diagnosis, H/O Pneumonia, C7 Vertebral Fracture, Anxiety, Depression, Amnesia, Chronic Leg Pain, Limited Family Support.]  General Interventions   General Interventions Discussed/Reviewed General Interventions Discussed, Labs, Vaccines, Doctor Visits, Health Screening, Annual Foot Exam, Lipid Profile, General Interventions Reviewed, Annual Eye Exam, Durable Medical Equipment (DME), [employer], Communication with, Level of Care  [Encouraged Routine Engagement with Care Team Members & Providers.]  Labs Hgb A1c every 3 months, Kidney Function, Hgb A1c annually  [Encouraged Routine Lab Work.]  Vaccines COVID-19, Flu, Pneumonia, RSV, Shingles, Tetanus/Pertussis/Diphtheria  [Encouraged Annual Vaccinations.]  Doctor Visits Discussed/Reviewed Doctor Visits Discussed, Specialist, Doctor Visits Reviewed, Annual Wellness Visits, PCP  [Encouraged Routine Engagement with Care Team Members & Providers.]  Health Screening Bone Density, Colonoscopy, Prostate  [Encouraged Routine Health Screenings.]  Durable Medical Equipment (DME) Other, BP Cuff  [Eyeglasses & Cane.]  PCP/Specialist Visits Compliance with follow-up visit  [Encouraged Routine Engagement with  Care Team Members & Providers.]  Communication with PCP/Specialists, RN, Pharmacists, Social Work  [employer] Routine Engagement with Care Team Members & Providers.]  Level of Care Adult Daycare, Air traffic controller, Assisted Living, Skilled Nursing Facility, Personal Care Services  [Confirmed Disinterest in Enrollment in Adult Day Care Program. Confirmed Disinterest in Pursuing Higher Level of Care Placement Options. Confirmed Disinterest in Applying for Personal Care Services.]  Applications Medicaid, Personal Care Services  [Confirmed Ineligibility for Medicaid. Confirmed Ineligibility for Personal Care Services.]  Exercise Interventions   Exercise Discussed/Reviewed Exercise Discussed, Assistive device use and maintanence, Exercise Reviewed, Physical Activity, Weight Managment  [Encouraged Daily Exercise Regimen, as Tolerated.]  Physical Activity Discussed/Reviewed Physical Activity Discussed, Home Exercise Program (HEP), PREP, Physical Activity Reviewed, Gym, Types of exercise  [Encouraged Increased Level of Exercise, Inside & Outside the Home. Encouraged Increased Involvement in Activities of Interest.]  Weight Management Weight loss  [Encouraged Healthy Weight Loss Regimen.]  Education Interventions   Education Provided Provided Therapist, sports, Provided Web-based Education, Provided Education  [employer] Material & Thoroughly Reviewed to [employer] Entertain Questions.]  Provided Verbal Education On Nutrition, Mental Health/Coping with Illness, When to see the doctor, Foot Care, Eye Care, Labs, Blood Sugar Monitoring, Applications, [employer], Exercise, Development worker, community, Medication  [Encouraged Continued Independent Review of Educational Material Provided.]  Ship broker, Personal Care Services  [Confirmed Ineligibility for [employer]. Confirmed Ineligibility for Personal Care Services.]  Mental Health Interventions   Mental Health Discussed/Reviewed Mental  Health Discussed, Anxiety, Depression, Mental Health Reviewed, Grief and Loss, Substance Abuse, Coping Strategies, Suicide, Crisis, Other  [Assessed Mental Health & Cognitive Status. Offered Counseling, Supportive Services, & Resources.]  Nutrition Interventions   Nutrition Discussed/Reviewed Nutrition Discussed, Adding fruits and  vegetables, Increasing proteins, Nutrition Reviewed, Fluid intake, Decreasing fats, Decreasing salt, Portion sizes, Carbohydrate meal planning, Decreasing sugar intake, Supplemental nutrition  [Encouraged Heart-Healthy, Low Sodium, Fiber-Rich, Reduced Fat, Low Sugar Diet.]  Pharmacy Interventions   Pharmacy Dicussed/Reviewed Pharmacy Topics Discussed, Medications and their functions, Medication Adherence, Pharmacy Topics Reviewed, Affording Medications  [Confirmed Ability to Afford Prescription Medications.]  Medication Adherence --  [Confirmed Compliance with Prescription Medications.]  Safety Interventions   Safety Discussed/Reviewed Safety Discussed, Fall Risk, Safety Reviewed, Home Safety  [Encouraged Routine Use of Assistive Devices & Durable Medical Equipment.]  Home Safety Assistive Devices, Need for home safety assessment  [Encouraged Consideration of Home Safety Evaluation.]  Advanced Directive Interventions   Advanced Directives Discussed/Reviewed Advanced Directives Discussed, Advanced Directives Reviewed  [Confirmed Initiation of Advanced Directives (Living Will & Healthcare Power of Attorney Documents). Copies Requested to Scan into Electronic Medical Record in Epic.]      Assessed Social Determinant of Health Barriers. Discussed Plans for Ongoing Care Management Follow Up. Provided Careers information officer Information for Care Management Team Members. Screened for Signs & Symptoms of Depression, Related to Chronic Disease State.  PHQ2 & PHQ9 Depression Screen Completed & Results Reviewed.  Suicidal Ideation & Homicidal Ideation Assessed - None Present.   Domestic  Violence Assessed - None Present. Access to Weapons Assessed - None Present.   Active Listening & Reflection Utilized.  Verbalization of Feelings Encouraged.  Emotional Support Provided. Feelings of Sadness & Hopelessness Validated. Stress & Fatigue Acknowledged. Self-Enrollment in Support Group of Interest Emphasized, from List Provided. Crisis Support Information, Agencies, Services, & Resources Discussed. Problem Solving Interventions Identified. Task-Centered Solutions Implemented.   Solution-Focused Strategies Developed. Acceptance & Commitment Therapy Introduced. Brief Cognitive Behavioral Therapy Initiated. Client-Centered Therapy Enacted. Reviewed Prescription Medications & Discussed Importance of Compliance. Quality of Sleep Assessed & Sleep Hygiene Techniques Promoted. Discussed Higher Level of Care Placement Options (I.e. Memory Care, Assisted Living, Extended Care, Skilled Nursing, Etc.) & Encouraged Consideration. Verified No In-Home Care Services, [employer], Warden/ranger, Etc., Covered Under Firefighter through [employer].   Verified No Long-Term Care Insurance Benefits, Secondary Insurance Policies, Plans, Coverage, Etc.  Confirmed Neither Patient Nor Divorced Wife Were Veterans, Making Him Ineligible to Apply for Aid & Attendance Benefits, Through [employer]. Encouraged Routine Engagement with Danford Bad, Licensed Clinical Social Worker with [facility], [facility] [phone]), if You Have Questions, Need Assistance, or If Additional Social Work Needs Are Identified Between Now & Our Next Follow-Up Outreach Call, Scheduled on 11/15/2023 at 12:30 PM.        SDOH assessments and interventions completed:  Yes.  SDOH Interventions Today    Flowsheet Row Most Recent Value  SDOH Interventions   Food Insecurity Interventions Intervention Not Indicated  Housing  Interventions Intervention Not Indicated  Transportation Interventions Intervention Not Indicated, Patient Resources (Friends/Family)  Utilities Interventions Intervention Not Indicated  Alcohol Usage Interventions Intervention Not Indicated (Score <7)  Depression Interventions/Treatment  Referral to Psychiatry, Medication, Counseling, Currently on Treatment, Community Resources Provided  Financial Strain Interventions Intervention Not Indicated  Physical Activity Interventions Community Resources Provided, Patient Declined  Stress Interventions [employer] Provided, Provide Counseling, Other (Comment), Offered [employer] Resources  [Provided Counseling & Supportive Services. Provided Counseling Resources.]  Social Connections Interventions Intervention Not Indicated, Community Resources Provided  Health Literacy Interventions Intervention Not Indicated     Care Coordination Interventions:  Yes, provided.   Follow up plan: Follow up call scheduled for 11/15/2023 at  12:30 pm.  Encounter Outcome:  Patient Visit Completed.    Danford Bad, BSW, MSW, LCSW [facility], [facility] Clinical Social Worker II Direct Dial: [phone]  Fax: [phone] Website: [email or web address]

## 2023-11-06 ENCOUNTER — Encounter: Payer: Self-pay | Admitting: *Deleted

## 2023-11-06 ENCOUNTER — Ambulatory Visit: Payer: Self-pay | Admitting: *Deleted

## 2023-11-06 NOTE — Patient Outreach (Signed)
 Care Coordination   Follow Up Visit Note   11/06/2023 Name: Micheal Baker MRN: [id number] DOB: [birth year]  Micheal Baker is a 78 y.o. year old male who sees Micheal Found, MD for primary care. I spoke with  Micheal Baker by phone today.  What matters to the patients health and wellness today?  Finding out results of CXR and resolving cough    Goals Addressed             This Visit's Progress    Resolve Flu Symptoms   Not on track    Care Management Goals: Patient will do deep breathing exercises every hour Patient will remain active but rest as needed. He will not overexert himself.  Patient will drink plenty of liquids to help think secretions Patient will reach out to provider with any new, persistent, or worsening symptoms and will seek emergency medical attention if needed Patient will follow-up with PCP Re: results of CXR done on 10/31/23 Patient will reach out to RN Care Manager at [phone] with any resource or care management needs        SDOH assessments and interventions completed:  Yes  SDOH Interventions Today    Flowsheet Row Most Recent Value  SDOH Interventions   Transportation Interventions Intervention Not Indicated, Patient Resources (Friends/Family)  Physical Activity Interventions Other (Comments)  [unable to increase at this time due to illness. Will continue to follow.]        Care Coordination Interventions:  Yes, provided  Interventions Today    Flowsheet Row Most Recent Value  Chronic Disease   Chronic disease during today's visit Other  [Persistent cough due to flu, H/O lung cancer, right upper lobe lung nodule, recent H/O pneumonia]  General Interventions   General Interventions Discussed/Reviewed General Interventions Discussed, General Interventions Reviewed, Communication with, Doctor Visits  [reviewed CXR at [facility] done on 10/31/23. Films are available but report is not]  Doctor Visits Discussed/Reviewed Doctor Visits Discussed, Doctor  Visits Reviewed, PCP, Specialist  PCP/Specialist Visits Compliance with follow-up visit  [follow-up with PCP and pulmonlogist as planned and sooner if needed]  Communication with --  [facility] radiology department [phone] regarding when the results of his CXR will be ready. Was told that they may be able to expedite the reading. Patient made aware.]  Exercise Interventions   Exercise Discussed/Reviewed Physical Activity  [able to perform ADLs independently but unable to exercise at this time due to illness]  Physical Activity Discussed/Reviewed Physical Activity Discussed, Physical Activity Reviewed  Education Interventions   Education Provided Provided Education  Provided Verbal Education On When to see the doctor, Mental Health/Coping with Illness, Exercise, Medication, Other  [Continue to walk around and remain active but rest as needed. Deep breathing exercises. Seek medical attention for persistent, new, or worsening symptoms]  Mental Health Interventions   Mental Health Discussed/Reviewed Mental Health Reviewed, Mental Health Discussed  [continue to talk with LCSW Re: Health related anxiet/depression]  Refer to Social Work for counseling regarding Depression, Anxiety/Coping  Nutrition Interventions   Nutrition Discussed/Reviewed Nutrition Discussed, Nutrition Reviewed, Fluid intake, Adding fruits and vegetables, Increasing proteins  [plenty of fluids to help thin secretions]  Pharmacy Interventions   Pharmacy Dicussed/Reviewed Pharmacy Topics Discussed, Pharmacy Topics Reviewed, Medications and their functions  [taking medication as prescribed]       Follow up plan: Follow up call scheduled for 11/06/23    Encounter Outcome:  Patient Visit Completed   Micheal Loll, RN, BSN [place]  [facility], [facility] RN Care Manager Direct Dial: [phone]

## 2023-11-07 ENCOUNTER — Encounter: Payer: Medicare PPO | Admitting: *Deleted

## 2023-11-07 ENCOUNTER — Encounter: Payer: Self-pay | Admitting: *Deleted

## 2023-11-07 ENCOUNTER — Ambulatory Visit: Payer: Self-pay | Admitting: *Deleted

## 2023-11-07 NOTE — Patient Outreach (Signed)
 Care Coordination   Follow Up Visit Note   11/07/2023 Name: Micheal Baker MRN: [id number] DOB: June 13, 1946  Micheal Baker is a 78 y.o. year old male who sees Micheal Found, MD for primary care. I spoke with  Micheal Baker by phone today.  What matters to the patients health and wellness today?  Getting CXR results    Goals Addressed             This Visit's Progress    Resolve Flu Symptoms   On track    Care Management Goals: Patient will do deep breathing exercises every hour Patient will remain active but rest as needed. He will not overexert himself.  Patient will drink plenty of liquids to help think secretions Patient will reach out to provider with any new, persistent, or worsening symptoms and will seek emergency medical attention if needed Patient will follow-up with PCP Re: results of CXR done on 10/31/23 Patient will reach out to RN Care Manager at [phone] with any resource or care management needs        SDOH assessments and interventions completed:  No    Care Coordination Interventions:  Yes, provided  Interventions Today    Flowsheet Row Most Recent Value  General Interventions   General Interventions Discussed/Reviewed General Interventions Discussed, General Interventions Reviewed, Labs, Durable Medical Equipment (DME), Doctor Visits  Doctor Visits Discussed/Reviewed Doctor Visits Discussed, Doctor Visits Reviewed, Annual Wellness Visits, PCP, Specialist  PCP/Specialist Visits Compliance with follow-up visit  Communication with PCP/Specialists  [facility] radiology department [phone]. They will reach out to [facility] to expedite the CXR reading. Expect results on 11/08/23]  Exercise Interventions   Exercise Discussed/Reviewed Physical Activity  Physical Activity Discussed/Reviewed Physical Activity Discussed, Physical Activity Reviewed  Education Interventions   Education Provided Provided Education  Provided Verbal Education On Nutrition,  Labs, Mental Health/Coping with Illness, Exercise, Medication, When to see the doctor  Mental Health Interventions   Mental Health Discussed/Reviewed Mental Health Discussed, Mental Health Reviewed  Nutrition Interventions   Nutrition Discussed/Reviewed Nutrition Discussed, Nutrition Reviewed, Portion sizes, Fluid intake, Adding fruits and vegetables  Pharmacy Interventions   Pharmacy Dicussed/Reviewed Pharmacy Topics Discussed, Pharmacy Topics Reviewed, Medications and their functions  Safety Interventions   Safety Discussed/Reviewed Safety Discussed, Safety Reviewed, Fall Risk, Home Safety  Home Safety Assistive Devices       Follow up plan:  RN will monitor for CXR results and reach out to patient when they have been posted.     Encounter Outcome:  Patient Visit Completed   [name], RN, BSN [place]  [facility], [facility] RN Care Manager Direct Dial: [phone]

## 2023-11-08 ENCOUNTER — Other Ambulatory Visit: Payer: Self-pay | Admitting: Gastroenterology

## 2023-11-15 ENCOUNTER — Ambulatory Visit: Payer: Self-pay | Admitting: *Deleted

## 2023-11-15 NOTE — Patient Instructions (Signed)
 Visit Information  Thank you for taking time to visit with me today. Please don't hesitate to contact me if I can be of assistance to you.   Following are the goals we discussed today:   Goals Addressed               This Visit's Progress     COMPLETED: Receive Counseling & Supportive Services to Reduce & Manage Symptoms of Depression. (pt-stated)   On track     Care Coordination Interventions:  Interventions Today    Flowsheet Row Most Recent Value  Chronic Disease   Chronic disease during today's visit Other, Hypertension (HTN)  [H/O Lung Cancer, Current Flu Diagnosis, H/O Pneumonia, C7 Vertebral Fracture, Anxiety, Depression, Amnesia, Chronic Leg Pain, Limited Family Support.]  General Interventions   General Interventions Discussed/Reviewed General Interventions Discussed, Labs, Vaccines, Doctor Visits, Health Screening, Annual Foot Exam, Lipid Profile, General Interventions Reviewed, Annual Eye Exam, Durable Medical Equipment (DME), [employer], Communication with, Level of Care  [Encouraged Routine Engagement with Care Team Members & Providers.]  Labs Hgb A1c every 3 months, Kidney Function, Hgb A1c annually  [Encouraged Routine Lab Work.]  Vaccines COVID-19, Flu, Pneumonia, RSV, Shingles, Tetanus/Pertussis/Diphtheria  [Encouraged Annual Vaccinations.]  Doctor Visits Discussed/Reviewed Doctor Visits Discussed, Specialist, Doctor Visits Reviewed, Annual Wellness Visits, PCP  [Encouraged Routine Engagement with Care Team Members & Providers.]  Health Screening Bone Density, Colonoscopy, Prostate  [Encouraged Routine Health Screenings.]  Durable Medical Equipment (DME) Other, BP Cuff  [Eyeglasses & Cane.]  PCP/Specialist Visits Compliance with follow-up visit  [Encouraged Routine Engagement with Care Team Members & Providers.]  Communication with PCP/Specialists, RN, Pharmacists, Social Work  [employer] Routine Engagement with Care Team Members & Providers.]  Level of Care  Adult Daycare, Air traffic controller, Assisted Living, Skilled Nursing Facility, Personal Care Services  [Confirmed Disinterest in Enrollment in Adult Day Care Program. Confirmed Disinterest in Pursuing Higher Level of Care Placement Options. Confirmed Disinterest in Applying for Personal Care Services.]  Applications Medicaid, Personal Care Services  [Confirmed Ineligibility for Medicaid. Confirmed Ineligibility for Personal Care Services.]  Exercise Interventions   Exercise Discussed/Reviewed Exercise Discussed, Assistive device use and maintanence, Exercise Reviewed, Physical Activity, Weight Managment  [Encouraged Daily Exercise Regimen, as Tolerated.]  Physical Activity Discussed/Reviewed Physical Activity Discussed, Home Exercise Program (HEP), PREP, Physical Activity Reviewed, Gym, Types of exercise  [Encouraged Increased Level of Exercise, Inside & Outside the Home. Encouraged Increased Involvement in Activities of Interest.]  Weight Management Weight loss  [Encouraged Healthy Weight Loss Regimen.]  Education Interventions   Education Provided Provided Therapist, sports, Provided Web-based Education, Provided Education  [employer] Material & Thoroughly Reviewed to [employer] Entertain Questions.]  Provided Verbal Education On Nutrition, Mental Health/Coping with Illness, When to see the doctor, Foot Care, Eye Care, Labs, Blood Sugar Monitoring, Applications, [employer], Exercise, Development worker, community, Medication  [Encouraged Continued Independent Review of Educational Material Provided.]  Ship broker, Personal Care Services  [Confirmed Ineligibility for [employer]. Confirmed Ineligibility for Personal Care Services.]  Mental Health Interventions   Mental Health Discussed/Reviewed Mental Health Discussed, Anxiety, Depression, Mental Health Reviewed, Grief and Loss, Substance Abuse, Coping Strategies, Suicide, Crisis, Other  [Assessed Mental Health & Cognitive Status.  Offered Counseling, Supportive Services, & Resources.]  Nutrition Interventions   Nutrition Discussed/Reviewed Nutrition Discussed, Adding fruits and vegetables, Increasing proteins, Nutrition Reviewed, Fluid intake, Decreasing fats, Decreasing salt, Portion sizes, Carbohydrate meal planning, Decreasing sugar intake, Supplemental nutrition  [Encouraged Heart-Healthy, Low Sodium, Fiber-Rich, Reduced Fat, Low Sugar Diet.]  Pharmacy  Interventions   Pharmacy Dicussed/Reviewed Pharmacy Topics Discussed, Medications and their functions, Medication Adherence, Pharmacy Topics Reviewed, Affording Medications  [Confirmed Ability to Afford Prescription Medications.]  Medication Adherence --  [Confirmed Compliance with Prescription Medications.]  Safety Interventions   Safety Discussed/Reviewed Safety Discussed, Fall Risk, Safety Reviewed, Home Safety  [Encouraged Routine Use of Assistive Devices & Durable Medical Equipment.]  Home Safety Assistive Devices, Need for home safety assessment  [Encouraged Consideration of Home Safety Evaluation.]  Advanced Directive Interventions   Advanced Directives Discussed/Reviewed Advanced Directives Discussed, Advanced Directives Reviewed  [Confirmed Initiation of Advanced Directives (Living Will & Healthcare Power of Attorney Documents). Copies Requested to Scan into Electronic Medical Record in Epic.]      Active Listening & Reflection Utilized.  Verbalization of Feelings Encouraged.  Emotional Support Provided. Problem Solving Interventions Employed. Task-Centered Solutions Activated.   Solution-Focused Strategies Implemented. Acceptance & Commitment Therapy Initiated. Cognitive Behavioral Therapy Indicated. Client-Centered Therapy Performed. Confirmed Disinterest in Pursuing Higher Level of Care Placement Options (I.e. Memory Care, Assisted Living, Extended Care, Skilled Nursing, Etc.). Encouraged Self-Enrollment with Psychiatrist of Interest in [facility],  from List Provided, to Receive Psychotropic Medication Administration & Management, In An Effort to Reduce & Manage Symptoms of Depression. Encouraged Self-Enrollment with Therapist of Interest in [facility], from List Provided, to Receive Psychotherapeutic Counseling & Supportive Services, In An Effort to Reduce & Manage Symptoms of Depression. Encouraged Routine Engagement with Lorna Few, Licensed Clinical Social Worker with [facility], Population Health If You Have Questions, Need Assistance, or Additional Social Work Needs Are Identified Between Now & Next Follow-Up Outreach Call, Scheduled on 11/28/2023 at 3:00 PM.      Your next appointment is by telephone on 11/28/2023 at 3:00 pm with Lorna Few, LCSW.  Please call the care guide team at [phone] if you need to cancel or reschedule your appointment.   If you are experiencing a Mental Health or Behavioral Health Crisis or need someone to talk to, please call the Suicide and Crisis Lifeline: 988 call the [place] National Suicide Prevention Lifeline: 812-215-6330 or TTY: 815-352-0734 TTY (818)743-4563) to talk to a trained counselor call 1-800-273-TALK (toll free, 24 hour hotline) go to [facility] [address] [place] [phone]) call the [facility] Crisis Line: 914-731-3184 call 911  Patient verbalizes understanding of instructions and care plan provided today and agrees to view in MyChart. Active MyChart status and patient understanding of how to access instructions and care plan via MyChart confirmed with patient.     Telephone follow up appointment with care management team member scheduled for:  11/28/2023 at 3:00 pm with Lorna Few, LCSW.   Danford Bad, BSW, MSW, LCSW [facility], [facility] Clinical Social Worker II Direct Dial: [phone]  Fax: [phone] Website: [email or web address]

## 2023-11-15 NOTE — Patient Outreach (Signed)
 Care Coordination   Follow Up Visit Note   11/15/2023  Name: Micheal Baker MRN: [id number] DOB: June 16, 1946  Micheal Baker is a 78 y.o. year old male who sees [name], MD for primary care. I spoke with Micheal Baker by phone today.  What matters to the patients health and wellness today?  Receive Counseling & Supportive Services to Reduce & Manage Symptoms of Depression.    Goals Addressed               This Visit's Progress     COMPLETED: Receive Counseling & Supportive Services to Reduce & Manage Symptoms of Depression. (pt-stated)   On track     Care Coordination Interventions:  Interventions Today    Flowsheet Row Most Recent Value  Chronic Disease   Chronic disease during today's visit Other, Hypertension (HTN)  [H/O Lung Cancer, Current Flu Diagnosis, H/O Pneumonia, C7 Vertebral Fracture, Anxiety, Depression, Amnesia, Chronic Leg Pain, Limited Family Support.]  General Interventions   General Interventions Discussed/Reviewed General Interventions Discussed, Labs, Vaccines, Doctor Visits, Health Screening, Annual Foot Exam, Lipid Profile, General Interventions Reviewed, Annual Eye Exam, Durable Medical Equipment (DME), [employer], Communication with, Level of Care  [Encouraged Routine Engagement with Care Team Members & Providers.]  Labs Hgb A1c every 3 months, Kidney Function, Hgb A1c annually  [Encouraged Routine Lab Work.]  Vaccines COVID-19, Flu, Pneumonia, RSV, Shingles, Tetanus/Pertussis/Diphtheria  [Encouraged Annual Vaccinations.]  Doctor Visits Discussed/Reviewed Doctor Visits Discussed, Specialist, Doctor Visits Reviewed, Annual Wellness Visits, PCP  [Encouraged Routine Engagement with Care Team Members & Providers.]  Health Screening Bone Density, Colonoscopy, Prostate  [Encouraged Routine Health Screenings.]  Durable Medical Equipment (DME) Other, BP Cuff  [Eyeglasses & Cane.]  PCP/Specialist Visits Compliance with follow-up visit  [Encouraged Routine  Engagement with Care Team Members & Providers.]  Communication with PCP/Specialists, RN, Pharmacists, Social Work  [employer] Routine Engagement with Care Team Members & Providers.]  Level of Care Adult Daycare, Air traffic controller, Assisted Living, Skilled Nursing Facility, Personal Care Services  [Confirmed Disinterest in Enrollment in Adult Day Care Program. Confirmed Disinterest in Pursuing Higher Level of Care Placement Options. Confirmed Disinterest in Applying for Personal Care Services.]  Applications Medicaid, Personal Care Services  [Confirmed Ineligibility for Medicaid. Confirmed Ineligibility for Personal Care Services.]  Exercise Interventions   Exercise Discussed/Reviewed Exercise Discussed, Assistive device use and maintanence, Exercise Reviewed, Physical Activity, Weight Managment  [Encouraged Daily Exercise Regimen, as Tolerated.]  Physical Activity Discussed/Reviewed Physical Activity Discussed, Home Exercise Program (HEP), PREP, Physical Activity Reviewed, Gym, Types of exercise  [Encouraged Increased Level of Exercise, Inside & Outside the Home. Encouraged Increased Involvement in Activities of Interest.]  Weight Management Weight loss  [Encouraged Healthy Weight Loss Regimen.]  Education Interventions   Education Provided Provided Therapist, sports, Provided Web-based Education, Provided Education  [employer] Material & Thoroughly Reviewed to [employer] Questions.]  Provided Verbal Education On Nutrition, Mental Health/Coping with Illness, When to see the doctor, Foot Care, Eye Care, Labs, Blood Sugar Monitoring, Applications, [employer], Exercise, Development worker, community, Medication  [Encouraged Continued Independent Review of Educational Material Provided.]  Ship broker, Personal Care Services  [Confirmed Ineligibility for [employer]. Confirmed Ineligibility for Personal Care Services.]  Mental Health Interventions   Mental Health  Discussed/Reviewed Mental Health Discussed, Anxiety, Depression, Mental Health Reviewed, Grief and Loss, Substance Abuse, Coping Strategies, Suicide, Crisis, Other  [Assessed Mental Health & Cognitive Status. Offered Counseling, Supportive Services, & Resources.]  Nutrition Interventions   Nutrition Discussed/Reviewed Nutrition Discussed, Adding  fruits and vegetables, Increasing proteins, Nutrition Reviewed, Fluid intake, Decreasing fats, Decreasing salt, Portion sizes, Carbohydrate meal planning, Decreasing sugar intake, Supplemental nutrition  [Encouraged Heart-Healthy, Low Sodium, Fiber-Rich, Reduced Fat, Low Sugar Diet.]  Pharmacy Interventions   Pharmacy Dicussed/Reviewed Pharmacy Topics Discussed, Medications and their functions, Medication Adherence, Pharmacy Topics Reviewed, Affording Medications  [Confirmed Ability to Afford Prescription Medications.]  Medication Adherence --  [Confirmed Compliance with Prescription Medications.]  Safety Interventions   Safety Discussed/Reviewed Safety Discussed, Fall Risk, Safety Reviewed, Home Safety  [Encouraged Routine Use of Assistive Devices & Durable Medical Equipment.]  Home Safety Assistive Devices, Need for home safety assessment  [Encouraged Consideration of Home Safety Evaluation.]  Advanced Directive Interventions   Advanced Directives Discussed/Reviewed Advanced Directives Discussed, Advanced Directives Reviewed  [Confirmed Initiation of Advanced Directives (Living Will & Healthcare Power of Attorney Documents). Copies Requested to Scan into Electronic Medical Record in Epic.]      Active Listening & Reflection Utilized.  Verbalization of Feelings Encouraged.  Emotional Support Provided. Problem Solving Interventions Employed. Task-Centered Solutions Activated.   Solution-Focused Strategies Implemented. Acceptance & Commitment Therapy Initiated. Cognitive Behavioral Therapy Indicated. Client-Centered Therapy Performed. Confirmed  Disinterest in Pursuing Higher Level of Care Placement Options (I.e. Memory Care, Assisted Living, Extended Care, Skilled Nursing, Etc.). Encouraged Self-Enrollment with Psychiatrist of Interest in [facility], from List Provided, to Receive Psychotropic Medication Administration & Management, In An Effort to Reduce & Manage Symptoms of Depression. Encouraged Self-Enrollment with Therapist of Interest in [facility], from List Provided, to Receive Psychotherapeutic Counseling & Supportive Services, In An Effort to Reduce & Manage Symptoms of Depression. Encouraged Routine Engagement with Lorna Few, Licensed Clinical Social Worker with [facility], Population Health If You Have Questions, Need Assistance, or Additional Social Work Needs Are Identified Between Now & Next Follow-Up Outreach Call, Scheduled on 11/28/2023 at 3:00 PM.      SDOH assessments and interventions completed:  Yes.  Care Coordination Interventions:  Yes, provided.   Follow up plan: Follow up call scheduled for 11/28/2023 at 3:00 pm with Lorna Few, LCSW.  Encounter Outcome:  Patient Visit Completed.   Danford Bad, BSW, MSW, LCSW [facility], [facility] Clinical Social Worker II Direct Dial: [phone]  Fax: [phone] Website: [email or web address]

## 2023-11-16 DIAGNOSIS — S12690A Other displaced fracture of seventh cervical vertebra, initial encounter for closed fracture: Secondary | ICD-10-CM | POA: Diagnosis not present

## 2023-11-16 DIAGNOSIS — S12691D Other nondisplaced fracture of seventh cervical vertebra, subsequent encounter for fracture with routine healing: Secondary | ICD-10-CM | POA: Diagnosis not present

## 2023-11-20 ENCOUNTER — Other Ambulatory Visit (HOSPITAL_COMMUNITY): Payer: Self-pay | Admitting: Neurological Surgery

## 2023-11-20 ENCOUNTER — Telehealth: Payer: Self-pay | Admitting: Pulmonary Disease

## 2023-11-20 DIAGNOSIS — S12691D Other nondisplaced fracture of seventh cervical vertebra, subsequent encounter for fracture with routine healing: Secondary | ICD-10-CM

## 2023-11-20 NOTE — Telephone Encounter (Signed)
 Patient states would like copy of biopsy procedure. Patient phone number is [phone].

## 2023-11-21 NOTE — Telephone Encounter (Signed)
 Spoke with patient regarding prior message . Advised patient I would print out the Biopsy from 02/07/2023 and mail him a copy. Patient's voice was understanding nothing else further needed.

## 2023-11-28 ENCOUNTER — Ambulatory Visit
Admission: EM | Admit: 2023-11-28 | Discharge: 2023-11-28 | Disposition: A | Discharge status: Home / Self Care | Attending: Nurse Practitioner | Admitting: Nurse Practitioner

## 2023-11-28 ENCOUNTER — Other Ambulatory Visit: Payer: Self-pay

## 2023-11-28 ENCOUNTER — Ambulatory Visit: Payer: Self-pay | Admitting: Licensed Clinical Social Worker

## 2023-11-28 DIAGNOSIS — R42 Dizziness and giddiness: Secondary | ICD-10-CM | POA: Diagnosis not present

## 2023-11-28 NOTE — Patient Instructions (Signed)
 Visit Information  Thank you for taking time to visit with me today. Please don't hesitate to contact me if I can be of assistance to you.   Following are the goals we discussed today:   Goals Addressed             This Visit's Progress    Patient Stated he has decreased energy occasionally       Interventions:  Spoke with client via phone today about his current status and needs faced.  Client had accident recently in his home and has a neck injury. He is wearing a neck brace currently.  He said he had a fall in his home about 9 weeks ago Spoke with client about pain issues of client. Discussed family support . He said he has support  from his daughter and from his son Discussed sleeping issues. Discussed transport. He drives himself on short trips or errands as needed Discussed nursing support with RN [name]. Client is appreciative of nursing support from RN [name] Provided counseling support for client LCSW talked with client about counseling resources in the area. LCSW spoke with client about [facility] in [place], [place].  Also LCSW informed client that he could contact [facility] for information on Mental Health Resources in the [place] (such as [facility] information and phone number) Also spoke with client about his support through [facility] client for phone call with LCSW today Encouraged client to call LCSW as needed for SW support at [phone] Discussed program support with RN, LCSW, Pharmacist        LCSW gave client LCSW name and phone number to utilize as needed. LCSW encouraged Jens to call LCSW as needed for SW needs of client at [phone]  Please call the care guide team at [phone] if you need to cancel or reschedule your appointment.   If you are experiencing a Mental Health or Behavioral Health Crisis or need someone to talk to, please call the [facility]: [phone]   The patient verbalized understanding of instructions, educational materials, and care plan provided today and DECLINED offer to receive copy of patient instructions, educational materials, and care plan.   The patient has been provided with contact information for the care management team and has been advised to call with any health related questions or concerns.    Lorna Few  MSW, LCSW [facility] Licensed Clinical Social Worker Direct Dial:  [phone] Fax:  [phone] Website:  [email or web address]

## 2023-11-28 NOTE — Patient Outreach (Signed)
 Care Coordination   Follow Up Visit Note   11/28/2023 Name: Micheal Baker MRN: [id number] DOB: 08/25/1946  Micheal Baker is a 78 y.o. year old male who sees [name], MD for primary care. I spoke with  Micheal Baker by phone today.  What matters to the patients health and wellness today? Patient stated he has decreased energy occasionally     Goals Addressed             This Visit's Progress    Patient Stated he has decreased energy occasionally       Interventions:  Spoke with client via phone today about his current status and needs faced.  Client had accident recently in his home and has a neck injury. He is wearing a neck brace currently.  He said he had a fall in his home about 9 weeks ago Spoke with client about pain issues of client. Discussed family support . He said he has support  from his daughter and from his son Discussed sleeping issues. Discussed transport. He drives himself on short trips or errands as needed Discussed nursing support with RN [name]. Client is appreciative of nursing support from RN [name] Provided counseling support for client LCSW talked with client about counseling resources in the area. LCSW spoke with client about [facility] in [place], [place].  Also LCSW informed client that he could contact [facility] for information on Mental Health Resources in the [place] (such as [facility] information and phone number) Also spoke with client about his support through [facility] client for phone call with LCSW today Encouraged client to call LCSW as needed for SW support at [phone] Discussed program support with RN, LCSW, Pharmacist         SDOH assessments and interventions completed:  Yes  SDOH Interventions Today    Flowsheet Row Most Recent Value  SDOH Interventions   Depression Interventions/Treatment  Counseling  Physical Activity Interventions Other  (Comments)  [client has had decreased activities recently. He said he is trying to walk a little more for exercise, when he is able to do so]  Stress Interventions Provide Counseling  [client has stress in managing medical needs]        Care Coordination Interventions:  Yes, provided    Interventions Today    Flowsheet Row Most Recent Value  Chronic Disease   Chronic disease during today's visit Other  [spoke with client about client needs]  General Interventions   General Interventions Discussed/Reviewed General Interventions Discussed, Community Resources  Education Interventions   Education Provided Provided Education  Provided Verbal Education On [employer]  Mental Health Interventions   Mental Health Discussed/Reviewed Coping Strategies  [client has anxiety and stress issues,  he is trying to use coping skills to manage his mental health needs]  Nutrition Interventions   Nutrition Discussed/Reviewed Nutrition Discussed  Pharmacy Interventions   Pharmacy Dicussed/Reviewed Pharmacy Topics Discussed  Safety Interventions   Safety Discussed/Reviewed Fall Risk       Follow up plan: LCSW gave client LCSW name and phone number to utilize as needed. LCSW encouraged [name] to call LCSW as needed for SW support for client at [phone].   Encounter Outcome:  Patient Visit Completed    Lorna Few  MSW, LCSW [facility] Licensed Clinical Social Worker Direct Dial:  [phone] Fax:  [phone] Website:  [email or web address]

## 2023-11-28 NOTE — ED Provider Notes (Signed)
 [facility]    CSN: [id number] Arrival date & time: 11/28/23  1031      History   Chief Complaint No chief complaint on file.   HPI SCOTTY WEIGELT is a 78 y.o. male.   Patient presents today with dizziness ongoing for the past 3 days.  He reports it only occurs when he goes from sitting to standing.  He denies room spinning sensation or sensation of going to pass out.  Denies recent fall, trauma, or known injury to the head immediately prior to the dizziness beginning.  He denies any recent medication changes.  No blurred or double vision, endorses a little bit of nausea associate with the dizziness but has never vomited.  No headache, ringing in the ears, chest pain, shortness of breath, or diaphoresis associated with the dizziness.  Reports he has had irregular heartbeats in the past and is wondering if his heart may be an and irregular rhythm.  He denies recent vomiting, diarrhea, or large loss of volume.    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   S/P lobectomy of lung 03/22/2023   Amnesia 02/08/2023   Lung nodule 01/19/2023   Hypocalcemia 12/26/2022   GERD (gastroesophageal reflux disease) 12/25/2022   Anxiety and depression 12/25/2022   Sepsis due to urinary tract infection (HCC) 12/24/2022   Constipation 10/27/2022   Change in stool caliber 10/27/2022   Pain of upper abdomen 10/27/2022   Left leg pain 09/21/2022   Urinary tract infection  associated with indwelling urethral catheter (HCC)    Severe sepsis (HCC) 01/30/2022   BPH (benign prostatic hyperplasia) 01/30/2022   Hypokalemia 01/30/2022   Acquired trigger finger of left index finger 02/04/2021   Bilateral carpal tunnel syndrome 02/04/2021   Acute lateral meniscus tear of left knee 06/27/2014   Acute medial meniscus tear of left knee    NSVT (nonsustained ventricular tachycardia) (HCC)    Bradycardia 06/10/2014   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   Cough 10/03/2012   Non-cardiac chest pain 08/08/2011   Essential hypertension 08/08/2011   Hypothyroidism 08/08/2011    Past Surgical History:  Procedure Laterality Date   BIOPSY  12/01/2022   Procedure: BIOPSY;  Surgeon: Lanelle Bal, DO;  [facility];  Service: Endoscopy;;   BRONCHIAL BIOPSY  02/07/2023   Procedure: BRONCHIAL BIOPSIES;  Surgeon: Josephine Igo, DO;  [facility]: [facility];  Service: Pulmonary;;   BRONCHIAL NEEDLE ASPIRATION BIOPSY  02/07/2023   Procedure: BRONCHIAL NEEDLE ASPIRATION BIOPSIES;  Surgeon: Josephine Igo, DO;  Location: [facility];  Service: Pulmonary;;   CATARACT EXTRACTION     left   COLONOSCOPY N/A 04/08/2016   Surgeon: West Bali, MD; one 6 mm polyp in the cecum removed, one 4 mm polyp in the descending colon removed, nonbleeding internal hemorrhoids.  Pathology with hyperplastic polyps.  Recommended colonoscopy in 10 years.   COLONOSCOPY  12/01/2022   COLONOSCOPY WITH PROPOFOL N/A 12/01/2022   Procedure: COLONOSCOPY WITH PROPOFOL;  Surgeon: Lanelle Bal, DO;  [facility];  Service: Endoscopy;  Laterality: N/A;  8:15 am, asa 3   endosocopy  12/01/2022   ESOPHAGOGASTRODUODENOSCOPY (EGD) WITH PROPOFOL N/A 12/01/2022   Procedure: ESOPHAGOGASTRODUODENOSCOPY (EGD) WITH PROPOFOL;  Surgeon: Lanelle Bal, DO;  [facility];  Service: Endoscopy;  Laterality: N/A;   EYE SURGERY     left-scar tissue   FIDUCIAL MARKER PLACEMENT  02/07/2023    Procedure: FIDUCIAL MARKER PLACEMENT;  Surgeon: Josephine Igo, DO;  [facility];  Service: Pulmonary;;   groin surgery Right 2010   growth removed    KNEE ARTHROSCOPY     left   KNEE ARTHROSCOPY WITH LATERAL MENISECTOMY Right 03/11/2015   Procedure: RIGHT KNEE ARTHROSCOPY WITH MEDIALAND LATERAL MENISECTOMY;  Surgeon: Salvatore Marvel, MD;  Location: [facility];  Service: Orthopedics;  Laterality: Right;   KNEE ARTHROSCOPY WITH MEDIAL MENISECTOMY Left 06/27/2014   Procedure: LEFT KNEE ARTHROSCOPY WITH PARTIAL MEDIAL AND LATERAL MENISECTOMIES AND CHONDROPLASTY;  Surgeon: Nilda Simmer, MD;  Location: [facility];  Service: Orthopedics;  Laterality: Left;   KNEE ARTHROSCOPY WITH MEDIAL MENISECTOMY Right 03/11/2015   Procedure: KNEE ARTHROSCOPY WITH MEDIAL MENISECTOMY;  Surgeon: Salvatore Marvel, MD;  Location: [facility];  Service: Orthopedics;  Laterality: Right;   LEFT HEART CATHETERIZATION WITH CORONARY ANGIOGRAM N/A 09/14/2011   Procedure: LEFT HEART CATHETERIZATION WITH CORONARY ANGIOGRAM;  Surgeon: [name], MD;  Location: [facility];  Service: Cardiovascular;  Laterality: N/A;   LYMPH NODE DISSECTION Left 03/22/2023   Procedure: LYMPH NODE DISSECTION;  Surgeon: Corliss Skains, MD;  Location: [facility];  Service: Thoracic;  Laterality: Left;   POLYPECTOMY  04/08/2016   Procedure: POLYPECTOMY;  Surgeon: West Bali, MD;  [facility];  Service: Endoscopy;;  colon    POLYPECTOMY  12/01/2022   POLYPECTOMY  12/01/2022   Procedure: POLYPECTOMY;  Surgeon: Lanelle Bal, DO;  [facility];  Service: Endoscopy;;   RETINAL DETACHMENT SURGERY Left 2002   Dr. Hermina Barters       Home Medications    Prior to Admission medications   Medication Sig Start Date End Date Taking? Authorizing Provider  acetaminophen (TYLENOL) 500 MG tablet Take 500-1,000 mg by mouth every 6 (six) hours as needed for mild pain,  headache or moderate pain.    [provider]  ALPHA LIPOIC ACID PO Take 1 capsule by mouth in the morning.    [provider]  B Complex-C (B-COMPLEX WITH VITAMIN C) tablet Take 1 tablet by mouth in the morning.    [provider]  benzonatate (TESSALON) 100 MG capsule Take 1 capsule (100 mg total) by mouth 3 (three) times daily as needed for cough. Do not take with alcohol or while driving or operating heavy machinery.  May cause drowsiness. Patient not taking: Reported on 09/26/2023 08/29/23   Valentino Nose, NP  buPROPion (WELLBUTRIN XL) 150 MG 24 hr tablet Take 150 mg by mouth daily. 02/01/23   [provider]  calcium carbonate (TUMS - DOSED IN MG ELEMENTAL CALCIUM) 500 MG chewable tablet Chew 1 tablet (200 mg of elemental calcium total) by mouth 3 (three) times daily with meals. Patient taking differently: Chew 1 tablet by mouth 3 (three) times daily as needed for indigestion or heartburn. 12/28/22  Shon Hale, MD  chlorpheniramine-HYDROcodone (TUSSIONEX) 10-8 MG/5ML Take 5 mLs by mouth every 12 (twelve) hours as needed for cough. 09/01/23   [provider]  diclofenac Sodium (VOLTAREN) 1 % GEL Apply 1 Application topically 4 (four) times daily as needed (knee pain).    [provider]  doxylamine, Sleep, (UNISOM) 25 MG tablet Take 25 mg by mouth at bedtime as needed for sleep.    [provider]  escitalopram (LEXAPRO) 20 MG tablet Take 20 mg by mouth daily.    [provider]  finasteride (PROSCAR) 5 MG tablet Take 1 tablet (5 mg total) by mouth daily. 10/06/23   McKenzie, Mardene Celeste, MD  fluticasone (FLONASE) 50 MCG/ACT nasal spray Place 1 spray into both nostrils daily as needed for allergies. 09/20/22   [provider]  levothyroxine (SYNTHROID) 88 MCG tablet Take 1 tablet (88 mcg total) by mouth daily before breakfast. 02/09/23 02/09/24  Shon Hale, MD  meloxicam (MOBIC) 15 MG tablet Take 1 tablet  (15 mg total) by mouth daily. 10/09/23   Hyatt, Max T, DPM  oxyCODONE-acetaminophen (PERCOCET/ROXICET) 5-325 MG tablet Take 1 tablet by mouth every 6 (six) hours as needed for severe pain (pain score 7-10). 09/30/23   Horton, Mayer Masker, MD  pantoprazole (PROTONIX) 40 MG tablet TAKE ONE TABLET BY MOUTH ONCE DAILY. 11/08/23   Letta Median, PA-C  Propylene Glycol (SYSTANE COMPLETE) 0.6 % SOLN Place 1 drop into both eyes as needed (dry eyes).    [provider]  silodosin (RAPAFLO) 8 MG CAPS capsule Take 1 capsule (8 mg total) by mouth 2 (two) times daily. 10/06/23   McKenzie, Mardene Celeste, MD  trimethoprim-polymyxin b (POLYTRIM) ophthalmic solution Place 1 drop into both eyes every 6 (six) hours. 09/04/23   Particia Nearing, PA-C  Vitamin D, Ergocalciferol, (DRISDOL) 1.25 MG (50000 UNIT) CAPS capsule Take by mouth. 08/18/23   [provider]    Family History Family History  Problem Relation Age of Onset   Hypertension Mother    Hypertension Father    Heart attack Father    Diabetes Father    Asthma Daughter    Colon cancer Neg Hx    Stomach cancer Neg Hx     Social History Social History   Tobacco Use   Smoking status: Never    Passive exposure: Never   Smokeless tobacco: Never   Tobacco comments:    06/10/14 1968- Tried it once and didn't like it- AJ  Vaping Use   Vaping status: Never Used  Substance Use Topics   Alcohol use: Yes    Alcohol/week: 0.0 standard drinks of alcohol    Comment: occasionally   Drug use: No     Allergies   Avelox [moxifloxacin] and Penicillins   Review of Systems Review of Systems Per HPI  Physical Exam Triage Vital Signs ED Triage Vitals  Encounter Vitals Group     BP 11/28/23 1125 (!) 147/92     Systolic BP Percentile --      Diastolic BP Percentile --      Pulse Rate 11/28/23 1125 84     Resp 11/28/23 1125 16     Temp 11/28/23 1125 98.6 F (37 C)     Temp Source 11/28/23 1125 Oral     SpO2 11/28/23 1125 97 %      Weight --      Height --      Head Circumference --      Peak Flow --  Pain Score 11/28/23 1126 0     Pain Loc --      Pain Education --      Exclude from Growth Chart --    Orthostatic VS for the past 24 hrs:  BP- Lying Pulse- Lying BP- Sitting Pulse- Sitting BP- Standing at 0 minutes Pulse- Standing at 0 minutes  11/28/23 1128 130/81 78 134/90 81 145/89 79    Updated Vital Signs BP (!) 147/92 (BP Location: Right Arm)   Pulse 84   Temp 98.6 F (37 C) (Oral)   Resp 16   SpO2 97%   Visual Acuity Right Eye Distance:   Left Eye Distance:   Bilateral Distance:    Right Eye Near:   Left Eye Near:    Bilateral Near:     Physical Exam Vitals and nursing note reviewed.  Constitutional:      General: He is not in acute distress.    Appearance: Normal appearance. He is not ill-appearing, toxic-appearing or diaphoretic.  HENT:     Head: Normocephalic and atraumatic.     Right Ear: Tympanic membrane, ear canal and external ear normal. There is no impacted cerumen.     Left Ear: Tympanic membrane, ear canal and external ear normal. There is no impacted cerumen.     Nose: Nose normal. No congestion or rhinorrhea.     Mouth/Throat:     Mouth: Mucous membranes are moist.     Pharynx: Oropharynx is clear. No posterior oropharyngeal erythema.  Eyes:     General: No scleral icterus.    Extraocular Movements: Extraocular movements intact.     Pupils: Pupils are equal, round, and reactive to light.  Cardiovascular:     Rate and Rhythm: Normal rate and regular rhythm.  Pulmonary:     Effort: Pulmonary effort is normal. No respiratory distress.     Breath sounds: Normal breath sounds. No wheezing, rhonchi or rales.  Musculoskeletal:     Cervical back: Normal range of motion and neck supple. No rigidity or tenderness.  Lymphadenopathy:     Cervical: No cervical adenopathy.  Skin:    General: Skin is warm and dry.     Capillary Refill: Capillary refill takes less than 2  seconds.     Coloration: Skin is not jaundiced or pale.     Findings: No erythema.  Neurological:     General: No focal deficit present.     Mental Status: He is alert and oriented to person, place, and time.     Cranial Nerves: Cranial nerves 2-12 are intact.     Sensory: Sensation is intact.     Motor: No weakness.     Coordination: Coordination is intact. Heel to [facility] Test normal. Rapid alternating movements normal.     Gait: Gait is intact. Gait normal.  Psychiatric:        Behavior: Behavior is cooperative.      UC Treatments / Results  Labs (all labs ordered are listed, but only abnormal results are displayed) Labs Reviewed  CBC  COMPREHENSIVE METABOLIC PANEL  MAGNESIUM  TSH    EKG   Radiology No results found.  Procedures Procedures (including critical care time)  Medications Ordered in UC Medications - No data to display  Initial Impression / Assessment and Plan / UC Course  I have reviewed the triage vital signs and the nursing notes.  Pertinent labs & imaging results that were available during my care of the patient were reviewed by me and considered in  my medical decision making (see chart for details).   Patient is well-appearing, normotensive, afebrile, not tachycardic, not tachypneic, oxygenating well on room air.    1. Dizziness Unclear etiology Vitals and exam are reassuring today; he is neurologically intact Orthostatic vital signs are negative today; EKG today shows ventricular rate of 67 bpm without significant ST segment or T wave changes when compared with previous EKG a few months ago Blood work obtained to evaluate for metabolic abnormality, magnesium, TSH, or infectious cause If all normal, I recommended follow-up with primary care provider and/or cardiologist to further determine cause In the meantime, be sure to hydrate with plenty fluids and recommended changing positions slowly Strict ER precautions discussed  The patient was given  the opportunity to ask questions.  All questions answered to their satisfaction.  The patient is in agreement to this plan.    Final Clinical Impressions(s) / UC Diagnoses   Final diagnoses:  Dizziness     Discharge Instructions      EKG today looks stable when compared with previous EKG and your vital signs are stable today.  Will contact you if any of the blood work comes back abnormal.  In the meantime, would recommend reaching out to your PCP and Dr. Jenene Slicker (Cardiologist) office if symptoms persist.      ED Prescriptions   None    PDMP not reviewed this encounter.   Valentino Nose, NP 11/28/23 1730

## 2023-11-28 NOTE — ED Triage Notes (Addendum)
 Pt reports he feels dizzy when he gets up from a sitting position x 3 days  Pt states that his blood pressure has been " elevated". State he got readings of 117/70 and 107/69

## 2023-11-28 NOTE — Discharge Instructions (Addendum)
 EKG today looks stable when compared with previous EKG and your vital signs are stable today.  Will contact you if any of the blood work comes back abnormal.  In the meantime, would recommend reaching out to your PCP and Dr. Jenene Slicker (Cardiologist) office if symptoms persist.

## 2023-11-29 LAB — CBC
Hematocrit: 43.7 % (ref 37.5–51.0)
Hemoglobin: 14.8 g/dL (ref 13.0–17.7)
MCH: 30 pg (ref 26.6–33.0)
MCHC: 33.9 g/dL (ref 31.5–35.7)
MCV: 89 fL (ref 79–97)
Platelets: 185 10*3/uL (ref 150–450)
RBC: 4.94 x10E6/uL (ref 4.14–5.80)
RDW: 13.8 % (ref 11.6–15.4)
WBC: 5.1 10*3/uL (ref 3.4–10.8)

## 2023-11-29 LAB — COMPREHENSIVE METABOLIC PANEL
ALT: 38 IU/L (ref 0–44)
AST: 27 IU/L (ref 0–40)
Albumin: 4.2 g/dL (ref 3.8–4.8)
Alkaline Phosphatase: 82 IU/L (ref 44–121)
BUN/Creatinine Ratio: 20 (ref 10–24)
BUN: 17 mg/dL (ref 8–27)
Bilirubin Total: 0.4 mg/dL (ref 0.0–1.2)
CO2: 20 mmol/L (ref 20–29)
Calcium: 9.2 mg/dL (ref 8.6–10.2)
Chloride: 102 mmol/L (ref 96–106)
Creatinine, Ser: 0.87 mg/dL (ref 0.76–1.27)
Globulin, Total: 2.7 g/dL (ref 1.5–4.5)
Glucose: 92 mg/dL (ref 70–99)
Potassium: 4.5 mmol/L (ref 3.5–5.2)
Sodium: 137 mmol/L (ref 134–144)
Total Protein: 6.9 g/dL (ref 6.0–8.5)
eGFR: 89 mL/min/{1.73_m2} (ref >59)

## 2023-11-29 LAB — MAGNESIUM: Magnesium: 2.3 mg/dL (ref 1.6–2.3)

## 2023-11-29 LAB — TSH: TSH: 1.87 u[IU]/mL (ref 0.450–4.500)

## 2023-12-08 ENCOUNTER — Other Ambulatory Visit: Payer: Self-pay | Admitting: Urology

## 2023-12-19 ENCOUNTER — Ambulatory Visit (HOSPITAL_COMMUNITY)
Admission: RE | Admit: 2023-12-19 | Discharge: 2023-12-19 | Disposition: A | Discharge status: Home / Self Care | Source: Ambulatory Visit | Attending: Neurological Surgery | Admitting: Neurological Surgery

## 2023-12-19 DIAGNOSIS — S12601D Unspecified nondisplaced fracture of seventh cervical vertebra, subsequent encounter for fracture with routine healing: Secondary | ICD-10-CM | POA: Diagnosis not present

## 2023-12-19 DIAGNOSIS — S12691D Other nondisplaced fracture of seventh cervical vertebra, subsequent encounter for fracture with routine healing: Secondary | ICD-10-CM | POA: Diagnosis not present

## 2023-12-19 DIAGNOSIS — M47812 Spondylosis without myelopathy or radiculopathy, cervical region: Secondary | ICD-10-CM | POA: Diagnosis not present

## 2023-12-21 DIAGNOSIS — S12691D Other nondisplaced fracture of seventh cervical vertebra, subsequent encounter for fracture with routine healing: Secondary | ICD-10-CM | POA: Diagnosis not present

## 2023-12-25 DIAGNOSIS — G4733 Obstructive sleep apnea (adult) (pediatric): Secondary | ICD-10-CM | POA: Diagnosis not present

## 2024-01-10 ENCOUNTER — Other Ambulatory Visit (HOSPITAL_COMMUNITY): Payer: Self-pay | Admitting: Neurological Surgery

## 2024-01-10 DIAGNOSIS — S12691D Other nondisplaced fracture of seventh cervical vertebra, subsequent encounter for fracture with routine healing: Secondary | ICD-10-CM

## 2024-01-18 ENCOUNTER — Encounter: Payer: Self-pay | Admitting: Gastroenterology

## 2024-01-29 ENCOUNTER — Ambulatory Visit (HOSPITAL_COMMUNITY)
Admission: RE | Admit: 2024-01-29 | Discharge: 2024-01-29 | Disposition: A | Discharge status: Home / Self Care | Source: Ambulatory Visit | Attending: Neurological Surgery | Admitting: Neurological Surgery

## 2024-01-29 DIAGNOSIS — M47812 Spondylosis without myelopathy or radiculopathy, cervical region: Secondary | ICD-10-CM | POA: Diagnosis not present

## 2024-01-29 DIAGNOSIS — S12601A Unspecified nondisplaced fracture of seventh cervical vertebra, initial encounter for closed fracture: Secondary | ICD-10-CM | POA: Diagnosis not present

## 2024-01-29 DIAGNOSIS — S12691D Other nondisplaced fracture of seventh cervical vertebra, subsequent encounter for fracture with routine healing: Secondary | ICD-10-CM | POA: Insufficient documentation

## 2024-01-29 DIAGNOSIS — M5021 Other cervical disc displacement,  high cervical region: Secondary | ICD-10-CM | POA: Diagnosis not present

## 2024-01-29 DIAGNOSIS — M4802 Spinal stenosis, cervical region: Secondary | ICD-10-CM | POA: Diagnosis not present

## 2024-02-01 DIAGNOSIS — S12691D Other nondisplaced fracture of seventh cervical vertebra, subsequent encounter for fracture with routine healing: Secondary | ICD-10-CM | POA: Diagnosis not present

## 2024-02-06 ENCOUNTER — Encounter: Payer: Self-pay | Admitting: Podiatry

## 2024-02-06 ENCOUNTER — Ambulatory Visit (INDEPENDENT_AMBULATORY_CARE_PROVIDER_SITE_OTHER): Admitting: Podiatry

## 2024-02-06 DIAGNOSIS — M722 Plantar fascial fibromatosis: Secondary | ICD-10-CM | POA: Diagnosis not present

## 2024-02-06 MED ORDER — TRIAMCINOLONE ACETONIDE 40 MG/ML IJ SUSP
40.0000 mg | Freq: Once | INTRAMUSCULAR | Status: AC
  Administered 2024-02-06: 40 mg

## 2024-02-06 NOTE — Progress Notes (Signed)
 He presents today for follow-up of his plantar fasciitis.  He states that he really does not want to take the meloxicam  he is concerned about taking that for a long time.  Objective: Vital signs are stable alert and oriented x 3.  Pulses are palpable.  There is no erythema edema cellulitis drainage or odor.  He has pain on palpation medial calcaneal tubercles bilaterally.  Assessment: Plantar fasciitis bilateral.  Plan: Bilateral heels today.  Injected them with 20 mg Kenalog  5 mg Marcaine  to the point of maximal tenderness.  Tolerated procedure well without complications.

## 2024-02-07 DIAGNOSIS — R2689 Other abnormalities of gait and mobility: Secondary | ICD-10-CM | POA: Diagnosis not present

## 2024-02-07 DIAGNOSIS — M542 Cervicalgia: Secondary | ICD-10-CM | POA: Diagnosis not present

## 2024-02-07 DIAGNOSIS — M6281 Muscle weakness (generalized): Secondary | ICD-10-CM | POA: Diagnosis not present

## 2024-02-08 DIAGNOSIS — M79644 Pain in right finger(s): Secondary | ICD-10-CM | POA: Diagnosis not present

## 2024-02-08 DIAGNOSIS — M65341 Trigger finger, right ring finger: Secondary | ICD-10-CM | POA: Diagnosis not present

## 2024-02-08 DIAGNOSIS — M729 Fibroblastic disorder, unspecified: Secondary | ICD-10-CM | POA: Insufficient documentation

## 2024-02-08 DIAGNOSIS — M720 Palmar fascial fibromatosis [Dupuytren]: Secondary | ICD-10-CM | POA: Diagnosis not present

## 2024-02-14 ENCOUNTER — Encounter (HOSPITAL_COMMUNITY): Payer: Self-pay

## 2024-02-14 ENCOUNTER — Emergency Department (HOSPITAL_COMMUNITY)
Admission: EM | Admit: 2024-02-14 | Discharge: 2024-02-14 | Disposition: A | Discharge status: Home / Self Care | Attending: Emergency Medicine | Admitting: Emergency Medicine

## 2024-02-14 ENCOUNTER — Emergency Department (HOSPITAL_COMMUNITY)

## 2024-02-14 ENCOUNTER — Other Ambulatory Visit: Payer: Self-pay

## 2024-02-14 DIAGNOSIS — E039 Hypothyroidism, unspecified: Secondary | ICD-10-CM | POA: Diagnosis not present

## 2024-02-14 DIAGNOSIS — R42 Dizziness and giddiness: Secondary | ICD-10-CM | POA: Diagnosis not present

## 2024-02-14 DIAGNOSIS — Z85118 Personal history of other malignant neoplasm of bronchus and lung: Secondary | ICD-10-CM | POA: Diagnosis not present

## 2024-02-14 DIAGNOSIS — R079 Chest pain, unspecified: Secondary | ICD-10-CM | POA: Diagnosis not present

## 2024-02-14 DIAGNOSIS — R29818 Other symptoms and signs involving the nervous system: Secondary | ICD-10-CM | POA: Diagnosis not present

## 2024-02-14 DIAGNOSIS — Z79899 Other long term (current) drug therapy: Secondary | ICD-10-CM | POA: Insufficient documentation

## 2024-02-14 DIAGNOSIS — I10 Essential (primary) hypertension: Secondary | ICD-10-CM | POA: Diagnosis not present

## 2024-02-14 DIAGNOSIS — I672 Cerebral atherosclerosis: Secondary | ICD-10-CM | POA: Diagnosis not present

## 2024-02-14 LAB — CBC WITH DIFFERENTIAL/PLATELET
Abs Immature Granulocytes: 0.02 10*3/uL (ref 0.00–0.07)
Basophils Absolute: 0 10*3/uL (ref 0.0–0.1)
Basophils Relative: 0 %
Eosinophils Absolute: 0.1 10*3/uL (ref 0.0–0.5)
Eosinophils Relative: 2 %
HCT: 42.7 % (ref 39.0–52.0)
Hemoglobin: 14.6 g/dL (ref 13.0–17.0)
Immature Granulocytes: 0 %
Lymphocytes Relative: 14 %
Lymphs Abs: 0.9 10*3/uL (ref 0.7–4.0)
MCH: 30.6 pg (ref 26.0–34.0)
MCHC: 34.2 g/dL (ref 30.0–36.0)
MCV: 89.5 fL (ref 80.0–100.0)
Monocytes Absolute: 0.5 10*3/uL (ref 0.1–1.0)
Monocytes Relative: 8 %
Neutro Abs: 5.1 10*3/uL (ref 1.7–7.7)
Neutrophils Relative %: 76 %
Platelets: 230 10*3/uL (ref 150–400)
RBC: 4.77 MIL/uL (ref 4.22–5.81)
RDW: 12.6 % (ref 11.5–15.5)
WBC: 6.7 10*3/uL (ref 4.0–10.5)
nRBC: 0 % (ref 0.0–0.2)

## 2024-02-14 LAB — COMPREHENSIVE METABOLIC PANEL WITH GFR
ALT: 43 U/L (ref 0–44)
AST: 34 U/L (ref 15–41)
Albumin: 4 g/dL (ref 3.5–5.0)
Alkaline Phosphatase: 69 U/L (ref 38–126)
Anion gap: 7 (ref 5–15)
BUN: 19 mg/dL (ref 8–23)
CO2: 23 mmol/L (ref 22–32)
Calcium: 9.2 mg/dL (ref 8.9–10.3)
Chloride: 103 mmol/L (ref 98–111)
Creatinine, Ser: 0.98 mg/dL (ref 0.61–1.24)
GFR, Estimated: >60 mL/min (ref >60)
Glucose, Bld: 127 mg/dL — ABNORMAL HIGH (ref 70–99)
Potassium: 3.7 mmol/L (ref 3.5–5.1)
Sodium: 133 mmol/L — ABNORMAL LOW (ref 135–145)
Total Bilirubin: 0.8 mg/dL (ref 0.0–1.2)
Total Protein: 6.9 g/dL (ref 6.5–8.1)

## 2024-02-14 MED ORDER — KETOROLAC TROMETHAMINE 30 MG/ML IJ SOLN
30.0000 mg | Freq: Once | INTRAMUSCULAR | Status: AC
  Administered 2024-02-14: 30 mg via INTRAVENOUS
  Filled 2024-02-14: qty 1

## 2024-02-14 MED ORDER — MECLIZINE HCL 25 MG PO TABS: 25.0000 mg | ORAL_TABLET | Freq: Three times a day (TID) | ORAL | 0 refills | Status: AC | PRN

## 2024-02-14 MED ORDER — SODIUM CHLORIDE 0.9 % IV BOLUS
1000.0000 mL | Freq: Once | INTRAVENOUS | Status: AC
Start: 2024-02-14 — End: 2024-02-14
  Administered 2024-02-14: 1000 mL via INTRAVENOUS

## 2024-02-14 NOTE — ED Provider Notes (Signed)
 [place] EMERGENCY DEPARTMENT AT [facility] Provider Note   CSN: [id number] Arrival date & time: 02/14/24  1503     History  Chief Complaint  Patient presents with   Dizziness    Micheal Baker is a 78 y.o. male.  Pt is a 78 yo male with pmhx significant for htn, hypothyroidism, NSVT, GERD, kidney stones, and lung cancer.  Pt has recently gotten out of a c-collar after sustaining a C7 cervical fx back in January.  He has been doing a lot of neck exercises as his neck's been stiff.  He developed a sudden onset of dizziness today.  He said it improved and he was able to drive here, but he still has a headache.  No other neurologic sx.       Home Medications Prior to Admission medications   Medication Sig Start Date End Date Taking? Authorizing Provider  meclizine  (ANTIVERT ) 25 MG tablet Take 1 tablet (25 mg total) by mouth 3 (three) times daily as needed for dizziness. 02/14/24  Yes Sueellen Emery, MD  acetaminophen  (TYLENOL ) 500 MG tablet Take 500-1,000 mg by mouth every 6 (six) hours as needed for mild pain, headache or moderate pain.    [provider]  ALPHA LIPOIC ACID PO Take 1 capsule by mouth in the morning.    [provider]  B Complex-C (B-COMPLEX WITH VITAMIN C) tablet Take 1 tablet by mouth in the morning.    [provider]  benzonatate  (TESSALON ) 100 MG capsule Take 1 capsule (100 mg total) by mouth 3 (three) times daily as needed for cough. Do not take with alcohol  or while driving or operating heavy machinery.  May cause drowsiness. Patient not taking: Reported on 09/26/2023 08/29/23   Wilhemena Harbour, NP  buPROPion  (WELLBUTRIN  XL) 150 MG 24 hr tablet Take 150 mg by mouth daily. 02/01/23   [provider]  calcium  carbonate (TUMS - DOSED IN MG ELEMENTAL CALCIUM ) 500 MG chewable tablet Chew 1 tablet (200 mg of elemental calcium  total) by mouth 3 (three) times daily with meals. Patient taking differently: Chew 1 tablet by  mouth 3 (three) times daily as needed for indigestion or heartburn. 12/28/22   Colin Dawley, MD  chlorpheniramine-HYDROcodone  (TUSSIONEX) 10-8 MG/5ML Take 5 mLs by mouth every 12 (twelve) hours as needed for cough. 09/01/23   [provider]  diclofenac Sodium (VOLTAREN) 1 % GEL Apply 1 Application topically 4 (four) times daily as needed (knee pain).    [provider]  doxylamine, Sleep, (UNISOM) 25 MG tablet Take 25 mg by mouth at bedtime as needed for sleep.    [provider]  escitalopram  (LEXAPRO ) 20 MG tablet Take 20 mg by mouth daily.    [provider]  finasteride  (PROSCAR ) 5 MG tablet TAKE ONE TABLET BY MOUTH ONCE DAILY. 12/08/23   McKenzie, Arden Beck, MD  fluticasone (FLONASE) 50 MCG/ACT nasal spray Place 1 spray into both nostrils daily as needed for allergies. 09/20/22   [provider]  levothyroxine  (SYNTHROID ) 88 MCG tablet Take 1 tablet (88 mcg total) by mouth daily before breakfast. 02/09/23 02/09/24  Colin Dawley, MD  meloxicam  (MOBIC ) 15 MG tablet Take 1 tablet (15 mg total) by mouth daily. 10/09/23   Hyatt, Max T, DPM  oxyCODONE -acetaminophen  (PERCOCET/ROXICET) 5-325 MG tablet Take 1 tablet by mouth every 6 (six) hours as needed for severe pain (pain score 7-10). 09/30/23   Horton, Vonzella Guernsey, MD  pantoprazole  (PROTONIX ) 40 MG tablet TAKE ONE  TABLET BY MOUTH ONCE DAILY. 11/08/23   Evander Hills, PA-C  Propylene Glycol (SYSTANE COMPLETE) 0.6 % SOLN Place 1 drop into both eyes as needed (dry eyes).    [provider]  silodosin  (RAPAFLO ) 8 MG CAPS capsule Take 1 capsule (8 mg total) by mouth 2 (two) times daily. 10/06/23   McKenzie, Arden Beck, MD  trimethoprim -polymyxin b  (POLYTRIM ) ophthalmic solution Place 1 drop into both eyes every 6 (six) hours. 09/04/23   Corbin Dess, PA-C  Vitamin D , Ergocalciferol , (DRISDOL) 1.25 MG (50000 UNIT) CAPS capsule Take by mouth. 08/18/23   [provider]       Allergies    Avelox [moxifloxacin] and Penicillins    Review of Systems   Review of Systems  Neurological:  Positive for dizziness.  All other systems reviewed and are negative.   Physical Exam Updated Vital Signs BP (!) 154/84   Pulse (!) 58   Temp 98.1 F (36.7 C) (Oral)   Resp 16   Ht 6\' 5"  (1.956 m)   Wt 94.4 kg   SpO2 100%   BMI 24.68 kg/m  Physical Exam Vitals and nursing note reviewed.  Constitutional:      Appearance: Normal appearance.  HENT:     Head: Normocephalic and atraumatic.     Right Ear: External ear normal.     Left Ear: External ear normal.     Nose: Nose normal.     Mouth/Throat:     Mouth: Mucous membranes are moist.     Pharynx: Oropharynx is clear.  Eyes:     Extraocular Movements: Extraocular movements intact.     Conjunctiva/sclera: Conjunctivae normal.     Pupils: Pupils are equal, round, and reactive to light.  Cardiovascular:     Rate and Rhythm: Normal rate and regular rhythm.     Pulses: Normal pulses.     Heart sounds: Normal heart sounds.  Pulmonary:     Effort: Pulmonary effort is normal.  Abdominal:     General: Abdomen is flat. Bowel sounds are normal.     Palpations: Abdomen is soft.  Musculoskeletal:        General: Normal range of motion.     Cervical back: Normal range of motion and neck supple.  Skin:    General: Skin is warm.     Capillary Refill: Capillary refill takes less than 2 seconds.  Neurological:     General: No focal deficit present.     Mental Status: He is alert and oriented to person, place, and time.  Psychiatric:        Mood and Affect: Mood normal.        Behavior: Behavior normal.     ED Results / Procedures / Treatments   Labs (all labs ordered are listed, but only abnormal results are displayed) Labs Reviewed  COMPREHENSIVE METABOLIC PANEL WITH GFR - Abnormal; Notable for the following components:      Result Value   Sodium 133 (*)    Glucose, Bld 127 (*)    All other components  within normal limits  CBC WITH DIFFERENTIAL/PLATELET  URINALYSIS, ROUTINE W REFLEX MICROSCOPIC    EKG EKG Interpretation Date/Time:  Wednesday February 14 2024 15:21:01 EDT Ventricular Rate:  79 PR Interval:  174 QRS Duration:  90 QT Interval:  410 QTC Calculation: 470 R Axis:   45  Text Interpretation: Sinus rhythm with occasional Premature ventricular complexes Septal infarct (cited on or before 24-Mar-2023) Abnormal ECG When compared with ECG  of 28-Nov-2023 12:13, Premature ventricular complexes are now Present Questionable change in initial forces of Septal leads PVCs are new Confirmed by Sueellen Emery [phone]) on 02/14/2024 4:39:51 PM  Radiology CT Head Wo Contrast Result Date: 02/14/2024 CLINICAL DATA:  Neuro deficit, acute, stroke suspected Dizziness. EXAM: CT HEAD WITHOUT CONTRAST TECHNIQUE: Contiguous axial images were obtained from the base of the skull through the vertex without intravenous contrast. RADIATION DOSE REDUCTION: This exam was performed according to the departmental dose-optimization program which includes automated exposure control, adjustment of the mA and/or kV according to patient size and/or use of iterative reconstruction technique. COMPARISON:  09/30/2023 FINDINGS: Brain: No intracranial hemorrhage, mass effect, or midline shift. No hydrocephalus. The basilar cisterns are patent. Stable chronic small vessel ischemic change. No evidence of territorial infarct or acute ischemia. No extra-axial or intracranial fluid collection. Vascular: Atherosclerosis of skullbase vasculature without hyperdense vessel or abnormal calcification. Skull: No fracture or focal lesion. Sinuses/Orbits: No acute finding. Other: None. IMPRESSION: 1. No acute intracranial abnormality. 2. Stable chronic small vessel ischemic change. Electronically Signed   By: Chadwick Colonel M.D.   On: 02/14/2024 18:14   DG Chest 2 View Result Date: 02/14/2024 CLINICAL DATA:  Chest pain. EXAM: CHEST - 2 VIEW  COMPARISON:  October 31, 2023. FINDINGS: The heart size and mediastinal contours are within normal limits. Both lungs are clear. The visualized skeletal structures are unremarkable. IMPRESSION: No active cardiopulmonary disease. Electronically Signed   By: Rosalene Colon M.D.   On: 02/14/2024 15:39    Procedures Procedures    Medications Ordered in ED Medications  sodium chloride  0.9 % bolus 1,000 mL (0 mLs Intravenous Stopped 02/14/24 2010)  ketorolac  (TORADOL ) 30 MG/ML injection 30 mg (30 mg Intravenous Given 02/14/24 2010)    ED Course/ Medical Decision Making/ A&P                                 Medical Decision Making Amount and/or Complexity of Data Reviewed Labs: ordered. Radiology: ordered.  Risk Prescription drug management.   This patient presents to the ED for concern of dizziness, this involves an extensive number of treatment options, and is a complaint that carries with it a high risk of complications and morbidity.  The differential diagnosis includes vertigo, cva, electrolyte abn   Co morbidities that complicate the patient evaluation  htn, hypothyroidism, NSVT, GERD, kidney stones, and lung cancer   Additional history obtained:  Additional history obtained from epic chart review  Lab Tests:  I Ordered, and personally interpreted labs.  The pertinent results include:  cbc nl, cmp nl   Imaging Studies ordered:  I ordered imaging studies including cxr, ct head  I independently visualized and interpreted imaging which showed  CXR: No active cardiopulmonary disease.  CT head: No acute intracranial abnormality.  2. Stable chronic small vessel ischemic change.   I agree with the radiologist interpretation   Cardiac Monitoring:  The patient was maintained on a cardiac monitor.  I personally viewed and interpreted the cardiac monitored which showed an underlying rhythm of: nsr   Medicines ordered and prescription drug management:  I ordered  medication including ivfs  for sx  Reevaluation of the patient after these medicines showed that the patient improved I have reviewed the patients home medicines and have made adjustments as needed   Test Considered:  Ct/mri   Problem List / ED Course:  Dizziness:  likely vertigo.  Pt is feeling better now without any neurologic sx.  He is stable for d/c.  He knows to return if worse. F/u with pcp.   Reevaluation:  After the interventions noted above, I reevaluated the patient and found that they have :improved   Social Determinants of Health:  Lives at home   Dispostion:  After consideration of the diagnostic results and the patients response to treatment, I feel that the patent would benefit from discharge with outpatient f/u.          Final Clinical Impression(s) / ED Diagnoses Final diagnoses:  Vertigo    Rx / DC Orders ED Discharge Orders          Ordered    meclizine  (ANTIVERT ) 25 MG tablet  3 times daily PRN        02/14/24 2010              Sueellen Emery, MD 02/14/24 2012

## 2024-02-14 NOTE — ED Triage Notes (Signed)
 Pt arrived via POV from home c/o dizziness. Pt reports he bent over to pull his shorts up, and then became very dizzy and nauseated. Pt denies emesis. Pt reports Hx of vertigo apprx 3 years ago. Pt denies any recent falls.

## 2024-02-15 DIAGNOSIS — M6281 Muscle weakness (generalized): Secondary | ICD-10-CM | POA: Diagnosis not present

## 2024-02-15 DIAGNOSIS — R2689 Other abnormalities of gait and mobility: Secondary | ICD-10-CM | POA: Diagnosis not present

## 2024-02-15 DIAGNOSIS — M542 Cervicalgia: Secondary | ICD-10-CM | POA: Diagnosis not present

## 2024-02-16 DIAGNOSIS — M65341 Trigger finger, right ring finger: Secondary | ICD-10-CM | POA: Diagnosis not present

## 2024-02-16 DIAGNOSIS — M79644 Pain in right finger(s): Secondary | ICD-10-CM | POA: Diagnosis not present

## 2024-02-19 DIAGNOSIS — M542 Cervicalgia: Secondary | ICD-10-CM | POA: Diagnosis not present

## 2024-02-19 DIAGNOSIS — R2689 Other abnormalities of gait and mobility: Secondary | ICD-10-CM | POA: Diagnosis not present

## 2024-02-19 DIAGNOSIS — M6281 Muscle weakness (generalized): Secondary | ICD-10-CM | POA: Diagnosis not present

## 2024-02-21 DIAGNOSIS — M6281 Muscle weakness (generalized): Secondary | ICD-10-CM | POA: Diagnosis not present

## 2024-02-21 DIAGNOSIS — M542 Cervicalgia: Secondary | ICD-10-CM | POA: Diagnosis not present

## 2024-02-21 DIAGNOSIS — R2689 Other abnormalities of gait and mobility: Secondary | ICD-10-CM | POA: Diagnosis not present

## 2024-02-26 DIAGNOSIS — M6281 Muscle weakness (generalized): Secondary | ICD-10-CM | POA: Diagnosis not present

## 2024-02-26 DIAGNOSIS — M542 Cervicalgia: Secondary | ICD-10-CM | POA: Diagnosis not present

## 2024-02-26 DIAGNOSIS — R2689 Other abnormalities of gait and mobility: Secondary | ICD-10-CM | POA: Diagnosis not present

## 2024-02-28 DIAGNOSIS — R2689 Other abnormalities of gait and mobility: Secondary | ICD-10-CM | POA: Diagnosis not present

## 2024-02-28 DIAGNOSIS — M6281 Muscle weakness (generalized): Secondary | ICD-10-CM | POA: Diagnosis not present

## 2024-02-28 DIAGNOSIS — M542 Cervicalgia: Secondary | ICD-10-CM | POA: Diagnosis not present

## 2024-03-04 DIAGNOSIS — M542 Cervicalgia: Secondary | ICD-10-CM | POA: Diagnosis not present

## 2024-03-04 DIAGNOSIS — R2689 Other abnormalities of gait and mobility: Secondary | ICD-10-CM | POA: Diagnosis not present

## 2024-03-04 DIAGNOSIS — M6281 Muscle weakness (generalized): Secondary | ICD-10-CM | POA: Diagnosis not present

## 2024-03-07 DIAGNOSIS — Z6825 Body mass index (BMI) 25.0-25.9, adult: Secondary | ICD-10-CM | POA: Diagnosis not present

## 2024-03-07 DIAGNOSIS — H8110 Benign paroxysmal vertigo, unspecified ear: Secondary | ICD-10-CM | POA: Diagnosis not present

## 2024-03-07 DIAGNOSIS — E663 Overweight: Secondary | ICD-10-CM | POA: Diagnosis not present

## 2024-03-08 DIAGNOSIS — R2689 Other abnormalities of gait and mobility: Secondary | ICD-10-CM | POA: Diagnosis not present

## 2024-03-08 DIAGNOSIS — M6281 Muscle weakness (generalized): Secondary | ICD-10-CM | POA: Diagnosis not present

## 2024-03-08 DIAGNOSIS — M542 Cervicalgia: Secondary | ICD-10-CM | POA: Diagnosis not present

## 2024-03-11 DIAGNOSIS — R2689 Other abnormalities of gait and mobility: Secondary | ICD-10-CM | POA: Diagnosis not present

## 2024-03-11 DIAGNOSIS — M542 Cervicalgia: Secondary | ICD-10-CM | POA: Diagnosis not present

## 2024-03-11 DIAGNOSIS — M6281 Muscle weakness (generalized): Secondary | ICD-10-CM | POA: Diagnosis not present

## 2024-03-13 DIAGNOSIS — M542 Cervicalgia: Secondary | ICD-10-CM | POA: Diagnosis not present

## 2024-03-13 DIAGNOSIS — R2689 Other abnormalities of gait and mobility: Secondary | ICD-10-CM | POA: Diagnosis not present

## 2024-03-13 DIAGNOSIS — M6281 Muscle weakness (generalized): Secondary | ICD-10-CM | POA: Diagnosis not present

## 2024-03-18 ENCOUNTER — Ambulatory Visit (HOSPITAL_COMMUNITY)
Admission: RE | Admit: 2024-03-18 | Discharge: 2024-03-18 | Disposition: A | Discharge status: Home / Self Care | Payer: Medicare PPO | Source: Ambulatory Visit | Attending: Physician Assistant | Admitting: Physician Assistant

## 2024-03-18 DIAGNOSIS — K449 Diaphragmatic hernia without obstruction or gangrene: Secondary | ICD-10-CM | POA: Diagnosis not present

## 2024-03-18 DIAGNOSIS — M6281 Muscle weakness (generalized): Secondary | ICD-10-CM | POA: Diagnosis not present

## 2024-03-18 DIAGNOSIS — C3490 Malignant neoplasm of unspecified part of unspecified bronchus or lung: Secondary | ICD-10-CM | POA: Diagnosis not present

## 2024-03-18 DIAGNOSIS — I700 Atherosclerosis of aorta: Secondary | ICD-10-CM | POA: Diagnosis not present

## 2024-03-18 DIAGNOSIS — M542 Cervicalgia: Secondary | ICD-10-CM | POA: Diagnosis not present

## 2024-03-18 DIAGNOSIS — R2689 Other abnormalities of gait and mobility: Secondary | ICD-10-CM | POA: Diagnosis not present

## 2024-03-18 MED ORDER — IOHEXOL 300 MG/ML  SOLN
75.0000 mL | Freq: Once | INTRAMUSCULAR | Status: AC | PRN
  Administered 2024-03-18: 75 mL via INTRAVENOUS

## 2024-03-25 ENCOUNTER — Inpatient Hospital Stay: Payer: Medicare PPO | Attending: Internal Medicine | Admitting: Internal Medicine

## 2024-03-25 VITALS — BP 135/85 | HR 67 | Temp 98.0°F | Resp 17 | Ht 77.0 in | Wt 206.0 lb

## 2024-03-25 DIAGNOSIS — Z85118 Personal history of other malignant neoplasm of bronchus and lung: Secondary | ICD-10-CM | POA: Insufficient documentation

## 2024-03-25 DIAGNOSIS — C3490 Malignant neoplasm of unspecified part of unspecified bronchus or lung: Secondary | ICD-10-CM | POA: Diagnosis not present

## 2024-03-25 DIAGNOSIS — Z902 Acquired absence of lung [part of]: Secondary | ICD-10-CM | POA: Diagnosis not present

## 2024-03-25 NOTE — Progress Notes (Signed)
 [facility] [phone]   [phone]  OFFICE PROGRESS NOTE  Micheal Rush, MD [address] [place] [address]  DIAGNOSIS:  Stage IA (T1a, N0, M0) non-small cell lung cancer, adenocarcinoma presented with left lower lobe lung nodule diagnosed in July 2024.  PRIOR THERAPY: status post left lower lobectomy with lymph node sampling under the care of Dr. Shyrl on March 22, 2023 with tumor size of 1.0 cm with focal visceral pleural invasion.   CURRENT THERAPY: Observation  INTERVAL HISTORY: Micheal Baker 78 y.o. male returns to the clinic today for follow-up visit. Discussed the use of AI scribe software for clinical note transcription with the patient, who gave verbal consent to proceed.  History of Present Illness   Micheal Baker is a 78 year old male with stage IA non-small cell lung cancer who presents for evaluation and repeat CT scan of the chest for restaging of his disease.  He was diagnosed with stage IA non-small cell lung cancer, adenocarcinoma, in July 2024. He underwent a left lower lobectomy with lymph node sampling in July 2024 and has been on observation since that time.  He has been experiencing increased energy levels and a desire to engage in activities. He previously sustained a fracture of his C7 vertebra in January 2025, for which he wore a brace for four months. He is now out of the brace and undergoing physical therapy.  No consistent changes in his medication regimen.        MEDICAL HISTORY: Past Medical History:  Diagnosis Date   Acute medial meniscus tear of left knee    Depression    Frequent PVCs    GERD (gastroesophageal reflux disease)    History of hiatal hernia    History of inguinal hernia    History of kidney stones    Hypertension    Hypothyroidism    Lung cancer (HCC)    NSVT (nonsustained ventricular tachycardia) (HCC)    Osteoarthritis    Sleep apnea    Thyroid  disease     ALLERGIES:   is allergic to avelox [moxifloxacin] and penicillins.  MEDICATIONS:  Current Outpatient Medications  Medication Sig Dispense Refill   acetaminophen  (TYLENOL ) 500 MG tablet Take 500-1,000 mg by mouth every 6 (six) hours as needed for mild pain, headache or moderate pain.     ALPHA LIPOIC ACID PO Take 1 capsule by mouth in the morning.     B Complex-C (B-COMPLEX WITH VITAMIN C) tablet Take 1 tablet by mouth in the morning.     benzonatate  (TESSALON ) 100 MG capsule Take 1 capsule (100 mg total) by mouth 3 (three) times daily as needed for cough. Do not take with alcohol  or while driving or operating heavy machinery.  May cause drowsiness. (Patient not taking: Reported on 09/26/2023) 21 capsule 0   buPROPion  (WELLBUTRIN  XL) 150 MG 24 hr tablet Take 150 mg by mouth daily.     calcium  carbonate (TUMS - DOSED IN MG ELEMENTAL CALCIUM ) 500 MG chewable tablet Chew 1 tablet (200 mg of elemental calcium  total) by mouth 3 (three) times daily with meals. (Patient taking differently: Chew 1 tablet by mouth 3 (three) times daily as needed for indigestion or heartburn.) 90 tablet 4   chlorpheniramine-HYDROcodone  (TUSSIONEX) 10-8 MG/5ML Take 5 mLs by mouth every 12 (twelve) hours as needed for cough.     diclofenac Sodium (VOLTAREN) 1 % GEL Apply 1 Application topically 4 (four) times daily as needed (knee  pain).     doxylamine, Sleep, (UNISOM) 25 MG tablet Take 25 mg by mouth at bedtime as needed for sleep.     escitalopram  (LEXAPRO ) 20 MG tablet Take 20 mg by mouth daily.     finasteride  (PROSCAR ) 5 MG tablet TAKE ONE TABLET BY MOUTH ONCE DAILY. 90 tablet 3   fluticasone (FLONASE) 50 MCG/ACT nasal spray Place 1 spray into both nostrils daily as needed for allergies.     levothyroxine  (SYNTHROID ) 88 MCG tablet Take 1 tablet (88 mcg total) by mouth daily before breakfast. 30 tablet 2   meclizine  (ANTIVERT ) 25 MG tablet Take 1 tablet (25 mg total) by mouth 3 (three) times daily as needed for dizziness. 30 tablet 0    meloxicam  (MOBIC ) 15 MG tablet Take 1 tablet (15 mg total) by mouth daily. 30 tablet 3   oxyCODONE -acetaminophen  (PERCOCET/ROXICET) 5-325 MG tablet Take 1 tablet by mouth every 6 (six) hours as needed for severe pain (pain score 7-10). 10 tablet 0   pantoprazole  (PROTONIX ) 40 MG tablet TAKE ONE TABLET BY MOUTH ONCE DAILY. 90 tablet 0   Propylene Glycol (SYSTANE COMPLETE) 0.6 % SOLN Place 1 drop into both eyes as needed (dry eyes).     silodosin  (RAPAFLO ) 8 MG CAPS capsule Take 1 capsule (8 mg total) by mouth 2 (two) times daily. 60 capsule 11   trimethoprim -polymyxin b  (POLYTRIM ) ophthalmic solution Place 1 drop into both eyes every 6 (six) hours. 10 mL 0   Vitamin D , Ergocalciferol , (DRISDOL) 1.25 MG (50000 UNIT) CAPS capsule Take by mouth.     No current facility-administered medications for this visit.    SURGICAL HISTORY:  Past Surgical History:  Procedure Laterality Date   BIOPSY  12/01/2022   Procedure: BIOPSY;  Surgeon: Cindie Carlin POUR, DO;  Location: [facility];  Service: Endoscopy;;   BRONCHIAL BIOPSY  02/07/2023   Procedure: BRONCHIAL BIOPSIES;  Surgeon: Brenna Adine CROME, DO;  Location: [facility];  Service: Pulmonary;;   BRONCHIAL NEEDLE ASPIRATION BIOPSY  02/07/2023   Procedure: BRONCHIAL NEEDLE ASPIRATION BIOPSIES;  Surgeon: Brenna Adine CROME, DO;  Location: [facility];  Service: Pulmonary;;   CATARACT EXTRACTION     left   COLONOSCOPY N/A 04/08/2016   Surgeon: Margo CROME Haddock, MD; one 6 mm polyp in the cecum removed, one 4 mm polyp in the descending colon removed, nonbleeding internal hemorrhoids.  Pathology with hyperplastic polyps.  Recommended colonoscopy in 10 years.   COLONOSCOPY  12/01/2022   COLONOSCOPY WITH PROPOFOL  N/A 12/01/2022   Procedure: COLONOSCOPY WITH PROPOFOL ;  Surgeon: Cindie Carlin POUR, DO;  [facility]: [facility];  Service: Endoscopy;  Laterality: N/A;  8:15 am, asa 3   endosocopy  12/01/2022   ESOPHAGOGASTRODUODENOSCOPY (EGD) WITH PROPOFOL  N/A  12/01/2022   Procedure: ESOPHAGOGASTRODUODENOSCOPY (EGD) WITH PROPOFOL ;  Surgeon: Cindie Carlin POUR, DO;  [facility]: [facility];  Service: Endoscopy;  Laterality: N/A;   EYE SURGERY     left-scar tissue   FIDUCIAL MARKER PLACEMENT  02/07/2023   Procedure: FIDUCIAL MARKER PLACEMENT;  Surgeon: Brenna Adine CROME, DO;  Location: [facility];  Service: Pulmonary;;   groin surgery Right 2010   growth removed    KNEE ARTHROSCOPY     left   KNEE ARTHROSCOPY WITH LATERAL MENISECTOMY Right 03/11/2015   Procedure: RIGHT KNEE ARTHROSCOPY WITH MEDIALAND LATERAL MENISECTOMY;  Surgeon: Lamar Millman, MD;  Location: [facility];  Service: Orthopedics;  Laterality: Right;   KNEE ARTHROSCOPY WITH MEDIAL MENISECTOMY Left 06/27/2014   Procedure:  LEFT KNEE ARTHROSCOPY WITH PARTIAL MEDIAL AND LATERAL MENISECTOMIES AND CHONDROPLASTY;  Surgeon: Lamar DELENA Millman, MD;  Location: [facility];  Service: Orthopedics;  Laterality: Left;   KNEE ARTHROSCOPY WITH MEDIAL MENISECTOMY Right 03/11/2015   Procedure: KNEE ARTHROSCOPY WITH MEDIAL MENISECTOMY;  Surgeon: Lamar Millman, MD;  Location: [facility];  Service: Orthopedics;  Laterality: Right;   LEFT HEART CATHETERIZATION WITH CORONARY ANGIOGRAM N/A 09/14/2011   Procedure: LEFT HEART CATHETERIZATION WITH CORONARY ANGIOGRAM;  Surgeon: Vinie KYM Maxcy, MD;  Location: [facility];  Service: Cardiovascular;  Laterality: N/A;   LYMPH NODE DISSECTION Left 03/22/2023   Procedure: LYMPH NODE DISSECTION;  Surgeon: Shyrl Linnie KIDD, MD;  Location: [facility];  Service: Thoracic;  Laterality: Left;   POLYPECTOMY  04/08/2016   Procedure: POLYPECTOMY;  Surgeon: Margo LITTIE Haddock, MD;  [facility]: [facility];  Service: Endoscopy;;  colon    POLYPECTOMY  12/01/2022   POLYPECTOMY  12/01/2022   Procedure: POLYPECTOMY;  Surgeon: Cindie Carlin POUR, DO;  Location: [facility];  Service: Endoscopy;;   RETINAL DETACHMENT SURGERY Left 2002   Dr.  Carmela    REVIEW OF SYSTEMS:  A comprehensive review of systems was negative.   PHYSICAL EXAMINATION: General appearance: alert, cooperative, and no distress Head: Normocephalic, without obvious abnormality, atraumatic Neck: no adenopathy, no JVD, supple, symmetrical, trachea midline, and thyroid  not enlarged, symmetric, no tenderness/mass/nodules Lymph nodes: Cervical, supraclavicular, and axillary nodes normal. Resp: clear to auscultation bilaterally Back: symmetric, no curvature. ROM normal. No CVA tenderness. Cardio: regular rate and rhythm, S1, S2 normal, no murmur, click, rub or gallop GI: soft, non-tender; bowel sounds normal; no masses,  no organomegaly Extremities: extremities normal, atraumatic, no cyanosis or edema  ECOG PERFORMANCE STATUS: 1 - Symptomatic but completely ambulatory  Blood pressure 135/85, pulse 67, temperature 98 F (36.7 C), temperature source Temporal, resp. rate 17, height 6' 5 (1.956 m), weight 206 lb (93.4 kg), SpO2 98%.  LABORATORY DATA: Lab Results  Component Value Date   WBC 6.7 02/14/2024   HGB 14.6 02/14/2024   HCT 42.7 02/14/2024   MCV 89.5 02/14/2024   PLT 230 02/14/2024      Chemistry      Component Value Date/Time   NA 133 (L) 02/14/2024 1537   NA 137 11/28/2023 1211   K 3.7 02/14/2024 1537   CL 103 02/14/2024 1537   CO2 23 02/14/2024 1537   BUN 19 02/14/2024 1537   BUN 17 11/28/2023 1211   CREATININE 0.98 02/14/2024 1537   CREATININE 1.03 09/26/2023 1448   CREATININE 1.04 01/30/2015 1407      Component Value Date/Time   CALCIUM  9.2 02/14/2024 1537   ALKPHOS 69 02/14/2024 1537   AST 34 02/14/2024 1537   AST 23 09/26/2023 1448   ALT 43 02/14/2024 1537   ALT 18 09/26/2023 1448   BILITOT 0.8 02/14/2024 1537   BILITOT 0.4 11/28/2023 1211   BILITOT 0.7 09/26/2023 1448       RADIOGRAPHIC STUDIES: CT Chest W Contrast Result Date: 03/18/2024 EXAM: CT CHEST WITH CONTRAST 03/18/2024 12:52:00 PM TECHNIQUE: CT of the chest  was performed with the administration of 75mL iohexol  (OMNIPAQUE ) 300 MG/ML solution. Multiplanar reformatted images are provided for review. Automated exposure control, iterative reconstruction, and/or weight based adjustment of the mA/kV was utilized to reduce the radiation dose to as low as reasonably achievable. COMPARISON: 09/18/2023 CLINICAL HISTORY: Non-small cell lung cancer (NSCLC), non-metastatic, assess treatment response. Non-small-cell lung cancer, staging. FINDINGS: MEDIASTINUM: Mild thoracic  aortic atherosclerosis. Moderate 3-vessel coronary atherosclerosis. LYMPH NODES: Calcified mediastinal and bilateral perihilar nodes, benign. LUNGS AND PLEURA: Status post left lower lobectomy. Mild biapical pleural parenchymal scarring. Mild centrilobular and paraseptal infraspinatus changes, upper lung predominant. Calcified granulomata bilaterally. No new suspicious pulmonary nodules. No pneumoth SOFT TISSUES/BONES: No acute abnormality of the bones or soft tissues. UPPER ABDOMEN: Moderate hiatal hernia. Calcified splenic granulomata. Calcified hepatic granulomata. IMPRESSION: 1. Status post left lower lobectomy. 2. No recurrent or metastatic disease. Electronically signed by: Pinkie Pebbles MD 03/18/2024 11:29 PM EDT RP Workstation: [id number]    ASSESSMENT AND PLAN: This is a very pleasant 78 years old white male with stage IA (T1a, N0, M0) non-small cell lung cancer, adenocarcinoma presented with left lower lobe lung nodule status post left lower lobectomy with lymph node sampling under the care of Dr. Shyrl on March 22, 2023 with tumor size of 1.0 cm with focal visceral pleural invasion.  The patient is currently on observation and he is feeling fine with no concerning complaints. He had repeat CT scan of the chest performed recently.  Independent reviewed the scan and discussed the result with the patient today.  His scan showed no concerning findings for disease recurrence or  metastasis. Assessment and Plan    Stage 1A non-small cell lung cancer, adenocarcinoma Status post left lower lobectomy with lymph node sampling in July 2024. Currently no evidence of recurrence on recent chest CT scan. Reports improved energy levels and desire to engage in activities. - Continue observation with repeat chest CT scan every six months until July 2026.  Fracture of C7 vertebra, healed C7 vertebra fracture sustained in January, treated with brace for four months. Currently healed and undergoing physical therapy with reported improvement.   The patient was advised to call immediately if she has any concerning symptoms in the interval. The patient voices understanding of current disease status and treatment options and is in agreement with the current care plan.  All questions were answered. The patient knows to call the clinic with any problems, questions or concerns. We can certainly see the patient much sooner if necessary.  The total time spent in the appointment was 20 minutes.  Disclaimer: This note was dictated with voice recognition software. Similar sounding words can inadvertently be transcribed and may not be corrected upon review.

## 2024-04-01 ENCOUNTER — Other Ambulatory Visit: Payer: Medicare PPO

## 2024-04-03 ENCOUNTER — Other Ambulatory Visit

## 2024-04-03 DIAGNOSIS — R6889 Other general symptoms and signs: Secondary | ICD-10-CM | POA: Diagnosis not present

## 2024-04-03 DIAGNOSIS — Z20828 Contact with and (suspected) exposure to other viral communicable diseases: Secondary | ICD-10-CM | POA: Diagnosis not present

## 2024-04-03 DIAGNOSIS — W57XXXA Bitten or stung by nonvenomous insect and other nonvenomous arthropods, initial encounter: Secondary | ICD-10-CM | POA: Diagnosis not present

## 2024-04-03 DIAGNOSIS — R509 Fever, unspecified: Secondary | ICD-10-CM | POA: Diagnosis not present

## 2024-04-03 DIAGNOSIS — Z6824 Body mass index (BMI) 24.0-24.9, adult: Secondary | ICD-10-CM | POA: Diagnosis not present

## 2024-04-03 DIAGNOSIS — U071 COVID-19: Secondary | ICD-10-CM | POA: Diagnosis not present

## 2024-04-08 ENCOUNTER — Ambulatory Visit: Payer: Medicare PPO | Admitting: Urology

## 2024-04-10 DIAGNOSIS — M170 Bilateral primary osteoarthritis of knee: Secondary | ICD-10-CM | POA: Diagnosis not present

## 2024-04-11 ENCOUNTER — Ambulatory Visit (INDEPENDENT_AMBULATORY_CARE_PROVIDER_SITE_OTHER): Admitting: Gastroenterology

## 2024-04-11 ENCOUNTER — Encounter: Payer: Self-pay | Admitting: Gastroenterology

## 2024-04-11 VITALS — BP 155/90 | HR 73 | Temp 97.6°F | Ht 77.0 in | Wt 203.8 lb

## 2024-04-11 DIAGNOSIS — K2270 Barrett's esophagus without dysplasia: Secondary | ICD-10-CM | POA: Diagnosis not present

## 2024-04-11 DIAGNOSIS — K219 Gastro-esophageal reflux disease without esophagitis: Secondary | ICD-10-CM

## 2024-04-11 DIAGNOSIS — K5900 Constipation, unspecified: Secondary | ICD-10-CM

## 2024-04-11 DIAGNOSIS — R159 Full incontinence of feces: Secondary | ICD-10-CM | POA: Diagnosis not present

## 2024-04-11 DIAGNOSIS — R150 Incomplete defecation: Secondary | ICD-10-CM

## 2024-04-11 NOTE — Patient Instructions (Addendum)
 VISIT SUMMARY: Today, we discussed your issues with stool leakage when passing gas, your history of Barrett's esophagus, and your recent health changes. We reviewed your current medications and dietary habits, and we made some adjustments to help manage your symptoms more effectively.  YOUR PLAN: -CONSTIPATION WITH INCOMPLETE EVACUATION AND FECAL SEEPAGE: Constipation can cause stool to remain in the rectum, leading to leakage when passing gas. To help with this, you should start taking a daily fiber supplement like Benefiber or Metamucil to improve stool bulk and ease of evacuation. Continue using Miralax  as needed, but be careful not to cause diarrhea. We provided samples of Metamucil powder for you to try.  For some the gassiness you may continue to take Gas-X as needed.  -BARRETT'S ESOPHAGUS, GERD: Barrett's esophagus is a condition where the lining of the esophagus changes due to acid reflux, increasing the risk of esophageal cancer. You should continue taking pantoprazole  daily to reduce acid and prevent further changes. It's important to take it consistently, preferably on an empty stomach. If taking it in the morning is difficult, you can take it in the evening.  INSTRUCTIONS: Start the metamucil powder or capsules once daily -directions on the box will say you can do a dose 3 times a day but I only want you to do it daily starting out.  2-3 teaspoons or 1 tablespoon should be sufficient and at least 8 ounces of liquid.  If you would like to try Benefiber this would be the same dose.  If you are having multiple days of feel like you are having to strain or having difficulty with bowel movements you may take MiraLAX  1 capful in 8 ounces of water .  In order to get some decent relief from this you may need to take  a dose multiple days in a row.  Stop if you are having significantly loose stools.  You may continue to take Gas-X as needed  Lets make a general follow-up in 6 months to see if the  issue with gassiness and constipation continues.  If anything worsens between now and then please feel free to reach out if you need any further recommendations.  Contains text generated by [employer].   It was a pleasure to see you today. I want to create trusting relationships with patients. If you receive a survey regarding your visit,  I greatly appreciate you taking time to fill this out on paper or through your MyChart. I value your feedback.  Charmaine Melia, MSN, FNP-BC, AGACNP-BC [facility]

## 2024-04-11 NOTE — Progress Notes (Signed)
 GI Office Note    Referring Provider: Marvine Rush, MD Primary Care Physician:  Marvine Rush, MD Primary Gastroenterologist: Carlin POUR. Cindie, DO  Date:  04/11/2024  ID:  Micheal Baker, DOB September 15, 1945, MRN [id number]   Chief Complaint   Chief Complaint  Patient presents with   Follow-up    Having issues with stool coming out when he passes gas and he doesn't realize it until afterwards.   History of Present Illness  Micheal Baker is a 78 y.o. male with a history of Barrett's esophagus/GERD, hypothyroidism, NSVT, hypertension, PVCs, BPH, depression, and non-small cell lung cancer stage Ia presenting today with complaint of fecal leakage with flatulence.  Colonoscopy in July 2017: -6 mm cecal polyp -4 mm polyp in the descending colon -Nonbleeding internal hemorrhoids -Path: Hyperplastic polyps -Repeat in 10 years   OV 10/27/2022 performed virtually.  He reported generalized upper abdominal pain that started after Christmas and worsens after eating.  Unable to identify any specific food trigger.  Denied any dysphagia.  Recently started on pantoprazole  40 mg daily which helps significantly but still with some pain in the mornings with coffee.  Stopped taking meloxicam  several months prior but recently started ibuprofen for mouth pain.  Occasional alcohol  use.  No tobacco use.  Also reported intermittent constipation that is chronic.  Complaint of hernia in the groin area and told to start taking stool softeners which states these cause him to have more gas and does not help significantly.  Incomplete bowel movements.  Scheduled for EGD and colon anoscopy.  Continue PPI daily.  Start MiraLAX  daily.  Increase fiber in diet.   EGD 12/01/2022: -2 cm hiatal hernia -Mild Schatzki's ring -Esophageal mucosal changes consistent with short segment Barrett's esophagus s/p biopsy -Gastritis s/p biopsy -Path: GE junction biopsy positive for Barrett's esophagus, gastric biopsy negative for H.  Pylori -Advised PPI twice daily   Colonoscopy 12/01/2022: -Nonbleeding internal hemorrhoids -4 mm polyp in the ascending colon -2 polyps in the sigmoid colon -Path: Tubular adenomas -No repeat colonoscopy due to age  Last office visit 02/20/23.  Taking pantoprazole  40 mg once daily.  At times he goes without a bowel movement and then will take MiraLAX .  May have small bowel movement at night prior to bed.  For the last 8 days prior he had been going daily for the most part and he had added mixed nuts back into his diet and felt as though that was helping.  Drinking plenty of water .  Does note having to strain with bowel movements but not often.  Taking MiraLAX  here and there.  Reportedly ended up having a catheter about a week after a prior procedure and developed a UTI and was given antibiotics.  Was given bowel regimen for constipation while in the hospital.  He was noted to have a left lung nodule biopsy the Tuesday prior to office visit that came back as cancer and was due to see oncology and a surgeon upcoming.  Having his PSA checked every 6 months with urology.  Advised MiraLAX  daily as needed, pantoprazole  40 mg daily, avoid NSAIDs and follow fiber rich diet.  Last office visit with oncology 03/25/2024.  He was experiencing increased energy levels and a desire to engage in activities.  Noted a C7 vertebrae fracture in January 2025 for which he wore a brace for 4 months.  Doing physical therapy.  Plan to continue with observation with repeat chest CT every 6 months until July 2026.   Today:  Discussed the use of AI scribe software for clinical note transcription with the patient, who gave verbal consent to proceed.  He experiences stool leakage when passing gas, sometimes realizing it only later when going to the bathroom or even with showering. The leakage occurs occasionally and fluctuates and is not daily. He has used Gas-X in the past, especially when planning to go out, and found it  somewhat helpful. No regular gas problems in the past and is unsure if his diet contributes to current symptoms.   He has a history of Barrett's esophagus and reflux, with an endoscopy in March 2024 showing mild Schatzky's ring and esophageal mucosal changes consistent with short segment Barrett's esophagus, confirmed by biopsy. Gastritis was also noted on endoscopy. He was last seen in June 2024 and was taking a proton pump inhibitor (PPI) once daily, although he sometimes forgets to take it for a couple of days.  He underwent a left lower lobectomy in July 2024 following a lung nodule biopsy that confirmed cancer. He reports that his energy levels fluctuate, with some days better than others. He has not undergone chemotherapy or immunotherapy.  He has a history of constipation, occasionally going a few days without a bowel movement and using Miralax  as needed. Bowel movements are sometimes difficult to start and occasionally incomplete, with episodes of very small bowel movements, [place] 1. He consumes a diet rich in fiber, including quiche with spinach and feta, [place] yogurt with frozen fruit, and homemade meals with ground [place], occasional pizza, salads. He has reduced alcohol  intake and uses almond milk and occasionally cottage cheese.  No significant amounts of dairy.  Denies any rectal bleeding or overt abdominal pain  He recently had COVID-19 and was prescribed doxycycline  for a bacterial lung infection. He also took Paxlovid during this time. He has a history of a C7 fracture from a fall out of bed during a dream, and he has been using melatonin and magnesium for sleep issues.       Wt Readings from Last 5 Encounters:  04/11/24 203 lb 12.8 oz (92.4 kg)  03/25/24 206 lb (93.4 kg)  02/14/24 208 lb 1.8 oz (94.4 kg)  09/30/23 208 lb 1.8 oz (94.4 kg)  09/26/23 208 lb 4.8 oz (94.5 kg)    Current Outpatient Medications  Medication Sig Dispense Refill   acetaminophen  (TYLENOL ) 500 MG  tablet Take 500-1,000 mg by mouth every 6 (six) hours as needed for mild pain, headache or moderate pain.     ALPHA LIPOIC ACID PO Take 1 capsule by mouth in the morning.     B Complex-C (B-COMPLEX WITH VITAMIN C) tablet Take 1 tablet by mouth in the morning.     buPROPion  (WELLBUTRIN  XL) 300 MG 24 hr tablet Take 300 mg by mouth daily.     diclofenac Sodium (VOLTAREN) 1 % GEL Apply 1 Application topically 4 (four) times daily as needed (knee pain).     escitalopram  (LEXAPRO ) 20 MG tablet Take 20 mg by mouth daily. (Patient taking differently: Take 10 mg by mouth daily.)     finasteride  (PROSCAR ) 5 MG tablet TAKE ONE TABLET BY MOUTH ONCE DAILY. 90 tablet 3   fluticasone (FLONASE) 50 MCG/ACT nasal spray Place 1 spray into both nostrils daily as needed for allergies.     levothyroxine  (SYNTHROID ) 88 MCG tablet Take 1 tablet (88 mcg total) by mouth daily before breakfast. 30 tablet 2   meclizine  (ANTIVERT ) 25 MG tablet Take 1 tablet (25 mg total) by  mouth 3 (three) times daily as needed for dizziness. 30 tablet 0   meloxicam  (MOBIC ) 15 MG tablet Take 1 tablet (15 mg total) by mouth daily. (Patient taking differently: Take 15 mg by mouth daily as needed.) 30 tablet 3   pantoprazole  (PROTONIX ) 40 MG tablet TAKE ONE TABLET BY MOUTH ONCE DAILY. 90 tablet 0   Propylene Glycol (SYSTANE COMPLETE) 0.6 % SOLN Place 1 drop into both eyes as needed (dry eyes).     silodosin  (RAPAFLO ) 8 MG CAPS capsule Take 1 capsule (8 mg total) by mouth 2 (two) times daily. 60 capsule 11   Vitamin D , Ergocalciferol , (DRISDOL) 1.25 MG (50000 UNIT) CAPS capsule Take by mouth.     doxycycline  (VIBRA -TABS) 100 MG tablet Take 100 mg by mouth 2 (two) times daily.     No current facility-administered medications for this visit.    Past Medical History:  Diagnosis Date   Acute medial meniscus tear of left knee    Depression    Frequent PVCs    GERD (gastroesophageal reflux disease)    History of hiatal hernia    History of  inguinal hernia    History of kidney stones    Hypertension    Hypothyroidism    Lung cancer (HCC)    NSVT (nonsustained ventricular tachycardia) (HCC)    Osteoarthritis    Sleep apnea    Thyroid  disease     Past Surgical History:  Procedure Laterality Date   BIOPSY  12/01/2022   Procedure: BIOPSY;  Surgeon: Cindie Carlin POUR, DO;  [facility];  Service: Endoscopy;;   BRONCHIAL BIOPSY  02/07/2023   Procedure: BRONCHIAL BIOPSIES;  Surgeon: Brenna Adine CROME, DO;  Location: [facility];  Service: Pulmonary;;   BRONCHIAL NEEDLE ASPIRATION BIOPSY  02/07/2023   Procedure: BRONCHIAL NEEDLE ASPIRATION BIOPSIES;  Surgeon: Brenna Adine CROME, DO;  Location: [facility];  Service: Pulmonary;;   CATARACT EXTRACTION     left   COLONOSCOPY N/A 04/08/2016   Surgeon: Margo CROME Haddock, MD; one 6 mm polyp in the cecum removed, one 4 mm polyp in the descending colon removed, nonbleeding internal hemorrhoids.  Pathology with hyperplastic polyps.  Recommended colonoscopy in 10 years.   COLONOSCOPY  12/01/2022   COLONOSCOPY WITH PROPOFOL  N/A 12/01/2022   Procedure: COLONOSCOPY WITH PROPOFOL ;  Surgeon: Cindie Carlin POUR, DO;  [facility];  Service: Endoscopy;  Laterality: N/A;  8:15 am, asa 3   endosocopy  12/01/2022   ESOPHAGOGASTRODUODENOSCOPY (EGD) WITH PROPOFOL  N/A 12/01/2022   Procedure: ESOPHAGOGASTRODUODENOSCOPY (EGD) WITH PROPOFOL ;  Surgeon: Cindie Carlin POUR, DO;  [facility];  Service: Endoscopy;  Laterality: N/A;   EYE SURGERY     left-scar tissue   FIDUCIAL MARKER PLACEMENT  02/07/2023   Procedure: FIDUCIAL MARKER PLACEMENT;  Surgeon: Brenna Adine CROME, DO;  Location: [facility];  Service: Pulmonary;;   groin surgery Right 2010   growth removed    KNEE ARTHROSCOPY     left   KNEE ARTHROSCOPY WITH LATERAL MENISECTOMY Right 03/11/2015   Procedure: RIGHT KNEE ARTHROSCOPY WITH MEDIALAND LATERAL MENISECTOMY;  Surgeon: Lamar Millman, MD;  Location: [facility];  Service: Orthopedics;  Laterality: Right;   KNEE ARTHROSCOPY WITH MEDIAL MENISECTOMY Left 06/27/2014   Procedure: LEFT KNEE ARTHROSCOPY WITH PARTIAL MEDIAL AND LATERAL MENISECTOMIES AND CHONDROPLASTY;  Surgeon: Lamar DELENA Millman, MD;  Location: [facility];  Service: Orthopedics;  Laterality: Left;   KNEE ARTHROSCOPY WITH MEDIAL MENISECTOMY Right 03/11/2015   Procedure:  KNEE ARTHROSCOPY WITH MEDIAL MENISECTOMY;  Surgeon: Lamar Millman, MD;  Location: [facility];  Service: Orthopedics;  Laterality: Right;   LEFT HEART CATHETERIZATION WITH CORONARY ANGIOGRAM N/A 09/14/2011   Procedure: LEFT HEART CATHETERIZATION WITH CORONARY ANGIOGRAM;  Surgeon: Vinie KYM Maxcy, MD;  Location: [facility];  Service: Cardiovascular;  Laterality: N/A;   LYMPH NODE DISSECTION Left 03/22/2023   Procedure: LYMPH NODE DISSECTION;  Surgeon: Shyrl Linnie KIDD, MD;  Location: [facility];  Service: Thoracic;  Laterality: Left;   POLYPECTOMY  04/08/2016   Procedure: POLYPECTOMY;  Surgeon: Margo LITTIE Haddock, MD;  [facility];  Service: Endoscopy;;  colon    POLYPECTOMY  12/01/2022   POLYPECTOMY  12/01/2022   Procedure: POLYPECTOMY;  Surgeon: Cindie Carlin POUR, DO;  [facility];  Service: Endoscopy;;   RETINAL DETACHMENT SURGERY Left 2002   Dr. Carmela    Family History  Problem Relation Age of Onset   Hypertension Mother    Hypertension Father    Heart attack Father    Diabetes Father    Asthma Daughter    Colon cancer Neg Hx    Stomach cancer Neg Hx     Allergies as of 04/11/2024 - Review Complete 04/11/2024  Allergen Reaction Noted   Avelox [moxifloxacin] Other (See Comments) 08/10/2017   Penicillins Other (See Comments) 08/08/2011    Social History   Socioeconomic History   Marital status: Divorced    Spouse name: Not on file   Number of children: 2   Years of education: Masters Degree   Highest education level: Master's degree (e.g., MA, MS,  MEng, MEd, MSW, MBA)  Occupational History   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  Lives alone in a one story home.  Has 2 children.  Retired from [employer] system.  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  Education: Scientist, water quality.     Social Drivers of Corporate investment banker Strain: Low Risk  (11/01/2023)   Overall Financial Resource Strain (CARDIA)    Difficulty of Paying Living Expenses: Not hard at all  Food Insecurity: No Food Insecurity (11/01/2023)   Hunger Vital Sign    Worried About Running Out of Food in the Last Year: Never true    Ran Out of Food in the Last Year: Never true  Transportation Needs: No Transportation Needs (11/06/2023)   PRAPARE - Administrator, Civil Service (Medical): No    Lack of Transportation (Non-Medical): No  Physical Activity: Insufficiently Active (11/28/2023)   Exercise Vital Sign    Days of Exercise per Week: 1 day    Minutes of Exercise per Session: 10 min  Stress: Stress Concern Present (11/28/2023)   [employer] of Occupational Health - Occupational Stress Questionnaire    Feeling of Stress : To some extent  Social  Connections: Moderately Integrated (11/01/2023)   Social Connection and Isolation Panel    Frequency of Communication with Friends and Family: More than three times a week    Frequency of Social Gatherings with Friends and Family: More than three times a week     Attends Religious Services: More than 4 times per year    Active Member of [employer] or Organizations: Yes    Attends Engineer, structural: More than 4 times per year    Marital Status: Divorced   Review of Systems   Gen: Denies fever, chills, anorexia. Denies fatigue, weakness, weight loss.  CV: Denies chest pain, palpitations, syncope, peripheral edema, and claudication. Resp: Denies dyspnea at rest, cough, wheezing, coughing up blood, and pleurisy. GI: See HPI Derm: Denies rash, itching, dry skin Psych: Denies depression, anxiety, memory loss, confusion. No homicidal or suicidal ideation.  Heme: Denies bruising, bleeding, and enlarged lymph nodes.  Physical Exam   BP (!) 155/90 (BP Location: Right Arm, Patient Position: Sitting, Cuff Size: Normal)   Pulse 73   Temp 97.6 F (36.4 C) (Oral)   Ht 6' 5 (1.956 m)   Wt 203 lb 12.8 oz (92.4 kg)   SpO2 99%   BMI 24.17 kg/m   General:   Alert and oriented. No distress noted. Pleasant and cooperative.  Head:  Normocephalic and atraumatic. Eyes:  Conjuctiva clear without scleral icterus. Mouth:  Oral mucosa pink and moist. Good dentition. No lesions. Abdomen:  +BS, soft, non-tender and non-distended. No rebound or guarding. No HSM or masses noted. Rectal: deferred Msk:  Symmetrical without gross deformities. Normal posture. Extremities:  Without edema. Neurologic:  Alert and  oriented x4 Psych:  Alert and cooperative. Normal mood and affect.  Assessment  Micheal Baker is a 78 y.o. male presenting today with concern for fecal leakage.     Constipation with incomplete evacuation and fecal seepage Intermittent fecal seepage likely due to constipation and incomplete evacuation, with possible stool retention in the rectal vault.  Having some intermittent gassiness as well. Diet includes adequate protein and fiber but may be deficient in the recommended 20-25 grams of fiber per day.  Reports adequate water  intake.  Recent  doxycycline  use for upper respiratory infection may have impacted bowel habits. - Initiate daily fiber supplementation with Benefiber or Metamucil to improve stool bulk and ease of evacuation.  No more than 1 dose daily, 1 tablespoon or 2 capsules depending on form of administration. - Continue Miralax  as needed for constipation, ensuring not to induce diarrhea. - Provide samples of Metamucil powder for trial. - May use Gas-X as needed for gassiness.  Barrett's esophagus, GERD Biopsy-confirmed short segment Barrett's esophagus with previous endoscopy and also showing mild Schatzki's ring. Pantoprazole  prescribed for acid suppression to prevent dysplastic changes and reduce esophageal cancer risk. He occasionally forgets to take pantoprazole .  Some days feels very well like he does not need medication for reflux. - Continue pantoprazole  40 mg daily for acid suppression, preferably on an empty stomach. - Educated on the importance of consistent pantoprazole  use to prevent progression of Barrett's esophagus.  Health permitting would be due for repeat upper endoscopy in 2029. - Discuss potential timing for pantoprazole  administration, suggesting evening dosing if morning is inconvenient.  Follow up   Follow up 6 months, sooner if needed.     Charmaine Melia, MSN, FNP-BC, AGACNP-BC [facility]

## 2024-04-12 ENCOUNTER — Other Ambulatory Visit

## 2024-04-12 DIAGNOSIS — R972 Elevated prostate specific antigen [PSA]: Secondary | ICD-10-CM | POA: Diagnosis not present

## 2024-04-14 LAB — PSA, TOTAL AND FREE
PSA, Free Pct: 17.7 %
PSA, Free: 0.78 ng/mL
Prostate Specific Ag, Serum: 4.4 ng/mL — ABNORMAL HIGH (ref 0.0–4.0)

## 2024-04-15 DIAGNOSIS — L218 Other seborrheic dermatitis: Secondary | ICD-10-CM | POA: Diagnosis not present

## 2024-04-15 DIAGNOSIS — X32XXXD Exposure to sunlight, subsequent encounter: Secondary | ICD-10-CM | POA: Diagnosis not present

## 2024-04-15 DIAGNOSIS — D225 Melanocytic nevi of trunk: Secondary | ICD-10-CM | POA: Diagnosis not present

## 2024-04-15 DIAGNOSIS — L570 Actinic keratosis: Secondary | ICD-10-CM | POA: Diagnosis not present

## 2024-04-15 DIAGNOSIS — Z1283 Encounter for screening for malignant neoplasm of skin: Secondary | ICD-10-CM | POA: Diagnosis not present

## 2024-04-17 DIAGNOSIS — M1712 Unilateral primary osteoarthritis, left knee: Secondary | ICD-10-CM | POA: Diagnosis not present

## 2024-04-17 DIAGNOSIS — M1711 Unilateral primary osteoarthritis, right knee: Secondary | ICD-10-CM | POA: Diagnosis not present

## 2024-04-17 DIAGNOSIS — M170 Bilateral primary osteoarthritis of knee: Secondary | ICD-10-CM | POA: Diagnosis not present

## 2024-04-24 DIAGNOSIS — M170 Bilateral primary osteoarthritis of knee: Secondary | ICD-10-CM | POA: Diagnosis not present

## 2024-04-24 DIAGNOSIS — M1712 Unilateral primary osteoarthritis, left knee: Secondary | ICD-10-CM | POA: Diagnosis not present

## 2024-04-24 DIAGNOSIS — M1711 Unilateral primary osteoarthritis, right knee: Secondary | ICD-10-CM | POA: Diagnosis not present

## 2024-04-25 NOTE — Progress Notes (Signed)
 [facility] - Clinic Note  05/01/2024     CHIEF COMPLAINT Patient presents for Retina Follow Up  HISTORY OF PRESENT ILLNESS: Micheal Baker is a 78 y.o. male who presents to the clinic today for:  No issues reported with vision HPI     Retina Follow Up   Patient presents with  Retinal Break/Detachment.  In both eyes.  This started 1 year ago.  Duration of 1 year.  Since onset it is stable.  I, the attending physician,  performed the HPI with the patient and updated documentation appropriately.        Comments   1 year retina follow up RD OU pt is reporting no vision changes noticed he denies any flashes or floaters       Last edited by Micheal Rogue, MD on 05/07/2024 10:48 PM.    Patient states no vision issues, just had some medical developments. Had a partial resection of lung due to cancer last year. Had walking pneumonia earlier this year. Had a fall, fractured C7 and had to wear a brace. Had pink eye.   Referring physician: [name], MD [address] [place],  [place] [address]  HISTORICAL INFORMATION:   Selected notes from the MEDICAL RECORD NUMBER     CURRENT MEDICATIONS: Current Outpatient Medications (Ophthalmic Drugs)  Medication Sig   Propylene Glycol (SYSTANE COMPLETE) 0.6 % SOLN Place 1 drop into both eyes as needed (dry eyes).   No current facility-administered medications for this visit. (Ophthalmic Drugs)   Current Outpatient Medications (Other)  Medication Sig   acetaminophen (TYLENOL) 500 MG tablet Take 500-1,000 mg by mouth every 6 (six) hours as needed for mild pain, headache or moderate pain.   ALPHA LIPOIC ACID PO Take 1 capsule by mouth in the morning.   B Complex-C (B-COMPLEX WITH VITAMIN C) tablet Take 1 tablet by mouth in the morning.   buPROPion (WELLBUTRIN XL) 300 MG 24 hr tablet Take 300 mg by mouth daily.   diclofenac Sodium (VOLTAREN) 1 % GEL Apply 1 Application topically 4 (four) times daily as needed (knee  pain).   doxycycline (VIBRA-TABS) 100 MG tablet Take 100 mg by mouth 2 (two) times daily.   escitalopram (LEXAPRO) 20 MG tablet Take 20 mg by mouth daily. (Patient taking differently: Take 10 mg by mouth daily.)   finasteride (PROSCAR) 5 MG tablet TAKE ONE TABLET BY MOUTH ONCE DAILY.   fluticasone (FLONASE) 50 MCG/ACT nasal spray Place 1 spray into both nostrils daily as needed for allergies.   levothyroxine (SYNTHROID) 88 MCG tablet Take 1 tablet (88 mcg total) by mouth daily before breakfast.   meclizine (ANTIVERT) 25 MG tablet Take 1 tablet (25 mg total) by mouth 3 (three) times daily as needed for dizziness.   meloxicam (MOBIC) 15 MG tablet Take 1 tablet (15 mg total) by mouth daily. (Patient taking differently: Take 15 mg by mouth daily as needed.)   pantoprazole (PROTONIX) 40 MG tablet TAKE ONE TABLET BY MOUTH ONCE DAILY.   silodosin (RAPAFLO) 8 MG CAPS capsule Take 1 capsule (8 mg total) by mouth 2 (two) times daily.   Vitamin D, Ergocalciferol, (DRISDOL) 1.25 MG (50000 UNIT) CAPS capsule Take by mouth.   No current facility-administered medications for this visit. (Other)   REVIEW OF SYSTEMS: ROS   Positive for: Genitourinary, Endocrine, Eyes, Psychiatric Negative for: Constitutional, Gastrointestinal, Neurological, Skin, Musculoskeletal, HENT, Cardiovascular, Respiratory, Allergic/Imm, Heme/Lymph Last edited by Resa Delon ORN, COT on 05/01/2024  1:30  PM.       ALLERGIES Allergies  Allergen Reactions   Avelox [Moxifloxacin] Other (See Comments)    Caused tendonitis   Penicillins Other (See Comments)    Chills    PAST MEDICAL HISTORY Past Medical History:  Diagnosis Date   Acute medial meniscus tear of left knee    Depression    Frequent PVCs    GERD (gastroesophageal reflux disease)    History of hiatal hernia    History of inguinal hernia    History of kidney stones    Hypertension    Hypothyroidism    Lung cancer (HCC)    NSVT (nonsustained ventricular  tachycardia) (HCC)    Osteoarthritis    Sleep apnea    Thyroid disease    Past Surgical History:  Procedure Laterality Date   BIOPSY  12/01/2022   Procedure: BIOPSY;  Surgeon: Cindie Carlin POUR, DO;  [facility]: [facility];  Service: Endoscopy;;   BRONCHIAL BIOPSY  02/07/2023   Procedure: BRONCHIAL BIOPSIES;  Surgeon: Brenna Adine CROME, DO;  Location: [facility];  Service: Pulmonary;;   BRONCHIAL NEEDLE ASPIRATION BIOPSY  02/07/2023   Procedure: BRONCHIAL NEEDLE ASPIRATION BIOPSIES;  Surgeon: Brenna Adine CROME, DO;  Location: [facility];  Service: Pulmonary;;   CATARACT EXTRACTION     left   COLONOSCOPY N/A 04/08/2016   Surgeon: Margo CROME Haddock, MD; one 6 mm polyp in the cecum removed, one 4 mm polyp in the descending colon removed, nonbleeding internal hemorrhoids.  Pathology with hyperplastic polyps.  Recommended colonoscopy in 10 years.   COLONOSCOPY  12/01/2022   COLONOSCOPY WITH PROPOFOL N/A 12/01/2022   Procedure: COLONOSCOPY WITH PROPOFOL;  Surgeon: Cindie Carlin POUR, DO;  [facility]: [facility];  Service: Endoscopy;  Laterality: N/A;  8:15 am, asa 3   endosocopy  12/01/2022   ESOPHAGOGASTRODUODENOSCOPY (EGD) WITH PROPOFOL N/A 12/01/2022   Procedure: ESOPHAGOGASTRODUODENOSCOPY (EGD) WITH PROPOFOL;  Surgeon: Cindie Carlin POUR, DO;  [facility]: [facility];  Service: Endoscopy;  Laterality: N/A;   EYE SURGERY     left-scar tissue   FIDUCIAL MARKER PLACEMENT  02/07/2023   Procedure: FIDUCIAL MARKER PLACEMENT;  Surgeon: Brenna Adine CROME, DO;  Location: [facility];  Service: Pulmonary;;   groin surgery Right 2010   growth removed    KNEE ARTHROSCOPY     left   KNEE ARTHROSCOPY WITH LATERAL MENISECTOMY Right 03/11/2015   Procedure: RIGHT KNEE ARTHROSCOPY WITH MEDIALAND LATERAL MENISECTOMY;  Surgeon: Lamar Millman, MD;  Location: [facility];  Service: Orthopedics;  Laterality: Right;   KNEE ARTHROSCOPY WITH MEDIAL MENISECTOMY Left 06/27/2014   Procedure: LEFT KNEE  ARTHROSCOPY WITH PARTIAL MEDIAL AND LATERAL MENISECTOMIES AND CHONDROPLASTY;  Surgeon: Lamar DELENA Millman, MD;  Location: [facility];  Service: Orthopedics;  Laterality: Left;   KNEE ARTHROSCOPY WITH MEDIAL MENISECTOMY Right 03/11/2015   Procedure: KNEE ARTHROSCOPY WITH MEDIAL MENISECTOMY;  Surgeon: Lamar Millman, MD;  Location: [facility];  Service: Orthopedics;  Laterality: Right;   LEFT HEART CATHETERIZATION WITH CORONARY ANGIOGRAM N/A 09/14/2011   Procedure: LEFT HEART CATHETERIZATION WITH CORONARY ANGIOGRAM;  Surgeon: Vinie KYM Maxcy, MD;  Location: [facility];  Service: Cardiovascular;  Laterality: N/A;   LYMPH NODE DISSECTION Left 03/22/2023   Procedure: LYMPH NODE DISSECTION;  Surgeon: Shyrl Linnie KIDD, MD;  Location: [facility];  Service: Thoracic;  Laterality: Left;   POLYPECTOMY  04/08/2016   Procedure: POLYPECTOMY;  Surgeon: Margo CROME Haddock, MD;  [facility];  Service: Endoscopy;;  colon    POLYPECTOMY  12/01/2022   POLYPECTOMY  12/01/2022   Procedure: POLYPECTOMY;  Surgeon: Cindie Carlin POUR, DO;  [facility];  Service: Endoscopy;;   RETINAL DETACHMENT SURGERY Left 2002   Dr. Carmela   FAMILY HISTORY Family History  Problem Relation Age of Onset   Hypertension Mother    Hypertension Father    Heart attack Father    Diabetes Father    Asthma Daughter    Colon cancer Neg Hx    Stomach cancer Neg Hx    SOCIAL HISTORY Social History   Tobacco Use   Smoking status: Never    Passive exposure: Never   Smokeless tobacco: Never   Tobacco comments:    06/10/14 1968- Tried it once and didn't like it- AJ  Vaping Use   Vaping status: Never Used  Substance Use Topics   Alcohol  use: Yes    Alcohol /week: 0.0 standard drinks of alcohol     Comment: occasionally   Drug use: No       OPHTHALMIC EXAM:  Base Eye Exam     Visual Acuity (Snellen - Linear)       Right Left   Dist [place] 20/25 20/80   Dist ph [place]  20/40 -1          Tonometry (Tonopen, 1:35 PM)       Right Left   Pressure 18 15         Pupils       Pupils Dark Light Shape React APD   Right PERRL 3 2 Round Brisk None   Left PERRL 3 2 Round Brisk None         Visual Fields       Left Right    Full Full         Extraocular Movement       Right Left    Full, Ortho Full, Ortho         Neuro/Psych     Oriented x3: Yes   Mood/Affect: Normal         Dilation     Both eyes: 2.5% Phenylephrine  @ 1:35 PM           Slit Lamp and Fundus Exam     Slit Lamp Exam       Right Left   Lids/Lashes Dermatochalasis - upper lid Dermatochalasis - upper lid   Conjunctiva/Sclera White and quiet White and quiet   Cornea Arcus, trace tear film debris Arcus, well healed temporal cataract wound, mild tear film debris   Anterior Chamber Deep, narrow angles Deep and quiet   Iris Round and dilated, mild anterior bowing Round and poorly dilated, iridodinesis   Lens 2-3+ NS, 2-3+ Cortical PC IOL in good position   Anterior Vitreous Syneresis, PVD, vitreous debris settled inferiorly Syneresis         Fundus Exam       Right Left   Disc Pink and sharp 2+pallor, sharp rim   C/D Ratio 0.2 0.4   Macula Flat, blunted foveal reflex, mild RPE mottling, no heme or edema Flat, blunted foveal reflex, mild RPE mottling, No heme or edema   Vessels Mild attenuation, mild tortuosity Mild attenuation, Tortuous   Periphery Laser scars from 0800-1000 surrounding focal detachment -- good barricade in place; otherwise attached, no new RT/RD, mild Reticular degeneration Attached over scleral buckle; CR scarring from 1030-600 clockwise, limited view due to poor dilation, no new RT/RD.  IMAGING AND PROCEDURES  Imaging and Procedures for 05/01/2024  OCT, Retina - OU - Both Eyes       Right Eye Quality was good. Central Foveal Thickness: 292. Progression has been stable. Findings include normal foveal contour, no IRF, no SRF.   Left  Eye Quality was good. Central Foveal Thickness: 352. Progression has been stable. Findings include no IRF, no SRF, abnormal foveal contour, macular pucker, outer retinal atrophy (Irregular inner-retinal surface. Focal CR atrophy temporal macula caught on widefield.).   Notes *Images captured and stored on drive  Diagnosis / Impression: History of RD OU -- retina attached OU OD: NFP; No IRF/SRF OS: Irregular inner-retinal surface.  Focal CR atrophy temporal macula caught on widefield. -- stable   Clinical management:  See below  Abbreviations: NFP - Normal foveal profile. CME - cystoid macular edema. PED - pigment epithelial detachment. IRF - intraretinal fluid. SRF - subretinal fluid. EZ - ellipsoid zone. ERM - epiretinal membrane. ORA - outer retinal atrophy. ORT - outer retinal tubulation. SRHM - subretinal hyper-reflective material. IRHM - intraretinal hyper-reflective material           ASSESSMENT/PLAN:    ICD-10-CM   1. History of retinal detachment  Z86.69 OCT, Retina - OU - Both Eyes    2. Combined forms of age-related cataract of right eye  H25.811     3. Pseudophakia  Z96.1      1. Hx of RD OU  - OD focal RD 8-10 oclock s/p laser retinopexy / barricade - early 2000s, Dr. Maryellen  - OS s/p RD repair early 2000s, Dr. Maryellen  - has been followed by Dr. Marleen  - transferred care here due to proximity and transportation  - RD repairs stable OU  - no new RT/RD OU             - F/u in 1 yr, sooner prn -- DFE/OCT    2. Mixed Cataract OD - The symptoms of cataract, surgical options, and treatments and risks were discussed with patient. - discussed diagnosis and progression - now under the expert management of Dr. Fleeta  3. Pseudophakia OS  - s/p CE/IOL OS - unknown cataract surgeon at [facility]  - IOL in good position, doing well  - continue to monitor  Ophthalmic Meds Ordered this visit:  No orders of the defined types were placed in this encounter.      Return in about 1 year (around 05/01/2025) for Hx RD OU, DFE, OCT.  There are no Patient Instructions on file for this visit.  Explained the diagnoses, plan, and follow up with the patient and they expressed understanding.  Patient expressed understanding of the importance of proper follow up care.  This document serves as a record of services personally performed by Redell JUDITHANN Hans, MD, PhD. It was created on their behalf by Avelina Pereyra, COA an ophthalmic technician. The creation of this record is the provider's dictation and/or activities during the visit.   Electronically signed by: Avelina GORMAN Pereyra, COT  05/07/24  10:49 PM   This document serves as a record of services personally performed by Redell JUDITHANN Hans, MD, PhD. It was created on their behalf by Almetta Pesa, an ophthalmic technician. The creation of this record is the provider's dictation and/or activities during the visit.    Electronically signed by: Almetta Pesa, OA, 05/07/24  10:49 PM   Redell JUDITHANN Hans, M.D., Ph.D. Diseases & Surgery of the Retina and Vitreous [facility]  I have reviewed the above documentation for accuracy and completeness, and I agree with the above. Redell JUDITHANN Hans, M.D., Ph.D. 05/07/24 10:50 PM   Abbreviations: M myopia (nearsighted); A astigmatism; H hyperopia (farsighted); P presbyopia; Mrx spectacle prescription;  CTL contact lenses; OD right eye; OS left eye; OU both eyes  XT exotropia; ET esotropia; PEK punctate epithelial keratitis; PEE punctate epithelial erosions; DES dry eye syndrome; MGD meibomian gland dysfunction; ATs artificial tears; PFAT's preservative free artificial tears; NSC nuclear sclerotic cataract; PSC posterior subcapsular cataract; ERM epi-retinal membrane; PVD posterior vitreous detachment; RD retinal detachment; DM diabetes mellitus; DR diabetic retinopathy; NPDR non-proliferative diabetic retinopathy; PDR proliferative diabetic retinopathy; CSME  clinically significant macular edema; DME diabetic macular edema; dbh dot blot hemorrhages; CWS cotton wool spot; POAG primary open angle glaucoma; C/D cup-to-disc ratio; HVF humphrey visual field; GVF goldmann visual field; OCT optical coherence tomography; IOP intraocular pressure; BRVO Branch retinal vein occlusion; CRVO central retinal vein occlusion; CRAO central retinal artery occlusion; BRAO branch retinal artery occlusion; RT retinal tear; SB scleral buckle; PPV pars plana vitrectomy; VH Vitreous hemorrhage; PRP panretinal laser photocoagulation; IVK intravitreal kenalog; VMT vitreomacular traction; MH Macular hole;  NVD neovascularization of the disc; NVE neovascularization elsewhere; AREDS age related eye disease study; ARMD age related macular degeneration; POAG primary open angle glaucoma; EBMD epithelial/anterior basement membrane dystrophy; ACIOL anterior chamber intraocular lens; IOL intraocular lens; PCIOL posterior chamber intraocular lens; Phaco/IOL phacoemulsification with intraocular lens placement; PRK photorefractive keratectomy; LASIK laser assisted in situ keratomileusis; HTN hypertension; DM diabetes mellitus; COPD chronic obstructive pulmonary disease

## 2024-04-26 ENCOUNTER — Other Ambulatory Visit: Payer: Self-pay | Admitting: Gastroenterology

## 2024-04-30 DIAGNOSIS — Z0001 Encounter for general adult medical examination with abnormal findings: Secondary | ICD-10-CM | POA: Diagnosis not present

## 2024-04-30 DIAGNOSIS — E559 Vitamin D deficiency, unspecified: Secondary | ICD-10-CM | POA: Diagnosis not present

## 2024-04-30 DIAGNOSIS — I10 Essential (primary) hypertension: Secondary | ICD-10-CM | POA: Diagnosis not present

## 2024-04-30 DIAGNOSIS — Z6825 Body mass index (BMI) 25.0-25.9, adult: Secondary | ICD-10-CM | POA: Diagnosis not present

## 2024-04-30 DIAGNOSIS — E782 Mixed hyperlipidemia: Secondary | ICD-10-CM | POA: Diagnosis not present

## 2024-04-30 DIAGNOSIS — Z1331 Encounter for screening for depression: Secondary | ICD-10-CM | POA: Diagnosis not present

## 2024-04-30 DIAGNOSIS — E039 Hypothyroidism, unspecified: Secondary | ICD-10-CM | POA: Diagnosis not present

## 2024-04-30 DIAGNOSIS — E538 Deficiency of other specified B group vitamins: Secondary | ICD-10-CM | POA: Diagnosis not present

## 2024-05-01 ENCOUNTER — Encounter (INDEPENDENT_AMBULATORY_CARE_PROVIDER_SITE_OTHER): Payer: Self-pay | Admitting: Ophthalmology

## 2024-05-01 ENCOUNTER — Ambulatory Visit (INDEPENDENT_AMBULATORY_CARE_PROVIDER_SITE_OTHER): Payer: Medicare PPO | Admitting: Ophthalmology

## 2024-05-01 DIAGNOSIS — H25811 Combined forms of age-related cataract, right eye: Secondary | ICD-10-CM | POA: Diagnosis not present

## 2024-05-01 DIAGNOSIS — Z8669 Personal history of other diseases of the nervous system and sense organs: Secondary | ICD-10-CM | POA: Diagnosis not present

## 2024-05-01 DIAGNOSIS — Z961 Presence of intraocular lens: Secondary | ICD-10-CM

## 2024-05-07 ENCOUNTER — Encounter (INDEPENDENT_AMBULATORY_CARE_PROVIDER_SITE_OTHER): Payer: Self-pay | Admitting: Ophthalmology

## 2024-05-14 DIAGNOSIS — M9905 Segmental and somatic dysfunction of pelvic region: Secondary | ICD-10-CM | POA: Diagnosis not present

## 2024-05-14 DIAGNOSIS — M62830 Muscle spasm of back: Secondary | ICD-10-CM | POA: Diagnosis not present

## 2024-05-14 DIAGNOSIS — M9902 Segmental and somatic dysfunction of thoracic region: Secondary | ICD-10-CM | POA: Diagnosis not present

## 2024-05-14 DIAGNOSIS — M9903 Segmental and somatic dysfunction of lumbar region: Secondary | ICD-10-CM | POA: Diagnosis not present

## 2024-05-17 DIAGNOSIS — M62830 Muscle spasm of back: Secondary | ICD-10-CM | POA: Diagnosis not present

## 2024-05-17 DIAGNOSIS — M9902 Segmental and somatic dysfunction of thoracic region: Secondary | ICD-10-CM | POA: Diagnosis not present

## 2024-05-17 DIAGNOSIS — M9905 Segmental and somatic dysfunction of pelvic region: Secondary | ICD-10-CM | POA: Diagnosis not present

## 2024-05-17 DIAGNOSIS — M9903 Segmental and somatic dysfunction of lumbar region: Secondary | ICD-10-CM | POA: Diagnosis not present

## 2024-06-04 ENCOUNTER — Ambulatory Visit: Admitting: Family Medicine

## 2024-06-04 ENCOUNTER — Encounter: Payer: Self-pay | Admitting: Family Medicine

## 2024-06-04 VITALS — BP 152/100 | HR 90 | Ht 77.0 in | Wt 213.1 lb

## 2024-06-04 DIAGNOSIS — Z902 Acquired absence of lung [part of]: Secondary | ICD-10-CM | POA: Diagnosis not present

## 2024-06-04 DIAGNOSIS — G4733 Obstructive sleep apnea (adult) (pediatric): Secondary | ICD-10-CM | POA: Diagnosis not present

## 2024-06-04 DIAGNOSIS — I10 Essential (primary) hypertension: Secondary | ICD-10-CM

## 2024-06-04 DIAGNOSIS — J302 Other seasonal allergic rhinitis: Secondary | ICD-10-CM

## 2024-06-04 DIAGNOSIS — R9439 Abnormal result of other cardiovascular function study: Secondary | ICD-10-CM

## 2024-06-04 DIAGNOSIS — R7309 Other abnormal glucose: Secondary | ICD-10-CM | POA: Diagnosis not present

## 2024-06-04 DIAGNOSIS — F32A Depression, unspecified: Secondary | ICD-10-CM

## 2024-06-04 DIAGNOSIS — I700 Atherosclerosis of aorta: Secondary | ICD-10-CM

## 2024-06-04 DIAGNOSIS — F419 Anxiety disorder, unspecified: Secondary | ICD-10-CM | POA: Diagnosis not present

## 2024-06-04 DIAGNOSIS — Z7689 Persons encountering health services in other specified circumstances: Secondary | ICD-10-CM

## 2024-06-04 DIAGNOSIS — K209 Esophagitis, unspecified without bleeding: Secondary | ICD-10-CM

## 2024-06-04 DIAGNOSIS — K2270 Barrett's esophagus without dysplasia: Secondary | ICD-10-CM

## 2024-06-04 DIAGNOSIS — E039 Hypothyroidism, unspecified: Secondary | ICD-10-CM

## 2024-06-04 DIAGNOSIS — D508 Other iron deficiency anemias: Secondary | ICD-10-CM | POA: Diagnosis not present

## 2024-06-04 DIAGNOSIS — Z23 Encounter for immunization: Secondary | ICD-10-CM

## 2024-06-04 MED ORDER — FLUTICASONE PROPIONATE 50 MCG/ACT NA SUSP: 1.0000 | Freq: Every day | NASAL | 11 refills | Status: AC | PRN

## 2024-06-04 MED ORDER — LOSARTAN POTASSIUM 25 MG PO TABS: 25.0000 mg | ORAL_TABLET | Freq: Every day | ORAL | 1 refills | Status: DC

## 2024-06-04 NOTE — Progress Notes (Signed)
 Patient Office Visit  Assessment & Plan:  Encounter to establish care -     CBC with Differential/Platelet -     VITAMIN D  25 Hydroxy (Vit-D Deficiency, Fractures) -     Vitamin B12 -     Iron, TIBC and Ferritin Panel -     Comprehensive metabolic panel with GFR  Hypothyroidism, unspecified type -     TSH  Anxiety and depression  Essential hypertension -     Losartan  Potassium; Take 1 tablet (25 mg total) by mouth daily.  Dispense: 90 tablet; Refill: 1 -     Magnesium  Barrett's esophagus with esophagitis  Need for Tdap vaccination -     Tdap vaccine greater than or equal to 7yo IM  Obstructive sleep apnea  S/P partial lobectomy of lung  Thoracic aortic atherosclerosis -     Lipid panel  Elevated glucose -     Hemoglobin A1c  Seasonal allergies -     Fluticasone  Propionate; Place 1 spray into both nostrils daily as needed for allergies.  Dispense: 18.2 mL; Refill: 11  Abnormal stress test -     Ambulatory referral to Cardiology   Assessment and Plan    Barrett's esophagus with hiatal hernia Barrett's esophagus with hiatal hernia and Schatzki ring. History of short segment Barrett's esophagitis. Symptoms are managed on medication. Risk of progression to esophageal cancer if not managed. - Take Protonix  twice daily as recommended by GI. - Schedule follow-up endoscopy every 2-3 years.  History of left lung cancer, post-resection, under surveillance Post-resection status with no evidence of recurrent or metastatic disease. Under surveillance. - Continue surveillance with CT scans every six months. - Follow up with Dr. Deatrice every six months.  Hypertension Blood pressure in the 150s-160s. Previous medication caused lethargy. Family history of stroke. Risk of stroke and heart attack due to elevated blood pressure and family history. - Start losartan  for blood pressure management. - Monitor blood pressure and follow up in a few weeks to assess  response.  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 No history of heart attack. Family history of heart disease. Risk of cardiovascular events due to atherosclerosis and reduced ejection fraction. Previous stress test showed abnormalities. - Order cholesterol panel to assess lipid levels. - Refer to cardiology for follow-up.  Hypothyroidism Long-standing hypothyroidism, well-managed with thyroid  medication. Symptoms are managed with treatment.  Depression and anxiety disorder Currently on Wellbutrin  after discontinuation of Lexapro . Reports less stress and improved energy levels. - Continue Wellbutrin  for depression management.  Obstructive sleep apnea, on CPAP Managed with CPAP. Reports improvement in energy levels. - Continue CPAP therapy.  Bilateral knee osteoarthritis Intermittent pain managed with Tylenol  and periodic viscosupplementation injections. Scheduled to see Dr. Theotis for further evaluation. - Continue Tylenol  as needed for pain management. - Follow up with Dr. Theotis for further evaluation and management.  Benign prostatic hyperplasia Managed with Proscar . Symptoms are managed with treatment. - Continue Proscar  for management.  General Health Maintenance Discussion of vaccinations and lifestyle modifications. Recent history of COVID-19 infection. Plans to receive flu vaccination. Consideration for pneumococcal 20 vaccine in the future. - Schedule flu vaccination once available. - Consider pneumococcal 20 vaccine in the future.      Test results were reviewed and analyzed as part of the medical decision making of this visit.  Viewed previous notes from primary care physician previous labs cardiology notes echo and stress testing done last year.  Also reviewed the CT scan of the chest done this July.  Follow-up on lab work notify patient.  Patient will likely benefit  from starting a statin but we will see what the results show first.  Cardiology consult ordered.  Tetanus updated today.  He will get the flu shot next time.  Start losartan  25 mg once a day. Recommend healthy diet i.e mediterranean/DASH diet, consistent exercise - 30 minutes 5 day per week, and gradual weight loss.     No follow-ups on file.   Subjective:    Patient ID: Micheal Baker, male    DOB: July 09, 1946  Age: 78 y.o. MRN: [id number]  Chief Complaint  Patient presents with   Medical Management of Chronic Issues   Establish Care    HPI Discussed the use of AI scribe software for clinical note transcription with the patient, who gave verbal consent to proceed.  History of Present Illness   Micheal Baker is a 78 year old male with lung cancer and hypertension, depression, GERD/Barrett's esophagitis, abnormal stress test, cervical fracture and recent history of lung cancer. He is establishing primay care here since his doctor moved to different location  He has a history of recent lung cancer, discovered incidentally during a hospitalization for sepsis last year. A nodule was found on his left lung, confirmed to be cancerous, and was surgically removed. He follows up every six months for monitoring and reports no need for additional treatment post-surgery.  He previously tried a low-dose medication but discontinued it due to lethargy and low energy. There is a family history of heart disease, with an uncle who died in his 13s from heart problems and a father who died at 65, possibly from a heart attack. Previous history of hypertension-patient was placed on metoprolol  last year but blood pressures got too low and made him very dizzy and bradycardic.  Patient was taken off the metoprolol .  Blood pressures have been borderline elevated the last few times he is seeing physicians. He has Barrett's esophagitis, hiatal hernia, and a Schatzki ring, discovered during an endoscopy. He takes Protonix   once daily for reflux but experiences gas and discomfort if he misses a dose.  He has a history of prostate issues with BPH, with a previously elevated PSA that has since decreased to the 4s after a biopsy returned negative for cancer. He takes Proscar  for prostate health.  He has a history of depression and was previously on Lexapro  and Wellbutrin . He discontinued Lexapro  in the summer and continues with Wellbutrin , which he feels helps with energy.  He has thyroid  issues and takes thyroid  medication daily.  He also takes vitamin D  and alpha-lipoic acid for neuropathy.  He experiences night sweats and increased sweating during exercise. Not having chest pain or SOB.  Patient has been working out. He uses a CPAP machine for sleep apnea, which he feels has improved his energy levels.  He has a history of retinal detachment and takes meloxicam  as needed for knee pain, which he manages with periodic visco supplementation injections.  He engages in regular physical activity, including cardio, weightlifting, and balance exercises. He also participates in stained glass classes and shag dancing for leisure. He lives independently and has two children who live an hour to an hour and a half away and check on him periodically. 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 History of short segment Barrett's esophagitis. Symptoms are managed on medication. Risk of progression to esophageal cancer  if not managed. - Take Protonix  twice daily as recommended by GI. - Schedule follow-up endoscopy every 2-3 years.  History of left lung cancer, post-resection, under surveillance Post-resection status with no evidence of recurrent or metastatic disease. Under surveillance. - Continue surveillance with CT scans every six months. - Follow up with Dr. Deatrice every six months.  Hypertension Blood pressure in the 150s-160s. Previous medication caused lethargy. Family history of stroke. Risk of stroke and heart attack due to elevated blood pressure and family history. - Start losartan  for blood pressure management. - Monitor blood pressure and follow up in a few weeks to assess response.  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 No history of heart attack. Family history of heart disease. Risk of cardiovascular events due to atherosclerosis and reduced ejection fraction. Previous stress test showed abnormalities. - Order cholesterol panel to assess lipid levels. - Refer to cardiology for follow-up.  Hypothyroidism Long-standing hypothyroidism, well-managed with thyroid  medication. Symptoms are managed with treatment.  Depression and anxiety disorder Currently on Wellbutrin  after discontinuation of Lexapro . Reports less stress and improved energy levels. - Continue Wellbutrin  for depression management.  Obstructive sleep apnea, on CPAP Managed with CPAP. Reports improvement in energy levels. - Continue CPAP therapy.  Bilateral knee osteoarthritis Intermittent pain managed with Tylenol  and periodic viscosupplementation injections. Scheduled to see Dr. Theotis for further evaluation. - Continue Tylenol  as needed for pain management. - Follow up with Dr. Theotis for further evaluation and management.  Benign prostatic hyperplasia Managed with Proscar . Symptoms are managed with  treatment. - Continue Proscar  for management.  General Health Maintenance Discussion of vaccinations and lifestyle modifications. Recent history of COVID-19 infection. Plans to receive flu vaccination. Consideration for pneumococcal 20 vaccine in the future. - Schedule flu vaccination once available. - Consider pneumococcal 20 vaccine in the future.  IMPRESSION: x-ray reading 1. Redemonstrated nondisplaced fracture involving the right articular process of C7 and the right lamina without significant change in alignment. Fracture lucency remains visible. No new fracture is seen 2. Multilevel degenerative changes. Abnormal CT scan  MEDIASTINUM: Mild thoracic aortic atherosclerosis. Moderate 3-vessel coronary atherosclerosis. The ASCVD Risk score (Arnett DK, et al., 2019) failed to calculate for the following reasons:   Cannot find a previous HDL lab   Cannot find a previous total cholesterol lab  The ASCVD Risk score (Arnett DK, et al., 2019) failed to calculate for the following reasons:   Cannot find a previous HDL lab   Cannot find a previous total cholesterol lab  Past Medical History:  Diagnosis Date   Acute medial meniscus tear of left knee    Depression    Frequent PVCs    GERD (gastroesophageal reflux disease)    History of hiatal hernia    History of inguinal hernia    History of kidney stones    Hypertension    Hypothyroidism    Lung cancer (HCC)    NSVT (nonsustained ventricular tachycardia) (HCC)    Osteoarthritis    Sleep apnea    Thyroid  disease    Past Surgical History:  Procedure  Laterality Date   BIOPSY  12/01/2022   Procedure: BIOPSY;  Surgeon: Cindie Carlin POUR, DO;  Location: [facility];  Service: Endoscopy;;   BRONCHIAL BIOPSY  02/07/2023   Procedure: BRONCHIAL BIOPSIES;  Surgeon: Brenna Adine CROME, DO;  Location: [facility];  Service: Pulmonary;;   BRONCHIAL NEEDLE ASPIRATION BIOPSY  02/07/2023   Procedure: BRONCHIAL NEEDLE ASPIRATION BIOPSIES;   Surgeon: Brenna Adine CROME, DO;  Location: [facility];  Service: Pulmonary;;   CATARACT EXTRACTION     left   COLONOSCOPY N/A 04/08/2016   Surgeon: Margo CROME Haddock, MD; one 6 mm polyp in the cecum removed, one 4 mm polyp in the descending colon removed, nonbleeding internal hemorrhoids.  Pathology with hyperplastic polyps.  Recommended colonoscopy in 10 years.   COLONOSCOPY  12/01/2022   COLONOSCOPY WITH PROPOFOL  N/A 12/01/2022   Procedure: COLONOSCOPY WITH PROPOFOL ;  Surgeon: Cindie Carlin POUR, DO;  Location: [facility];  Service: Endoscopy;  Laterality: N/A;  8:15 am, asa 3   endosocopy  12/01/2022   ESOPHAGOGASTRODUODENOSCOPY (EGD) WITH PROPOFOL  N/A 12/01/2022   Procedure: ESOPHAGOGASTRODUODENOSCOPY (EGD) WITH PROPOFOL ;  Surgeon: Cindie Carlin POUR, DO;  Location: [facility];  Service: Endoscopy;  Laterality: N/A;   EYE SURGERY     left-scar tissue   FIDUCIAL MARKER PLACEMENT  02/07/2023   Procedure: FIDUCIAL MARKER PLACEMENT;  Surgeon: Brenna Adine CROME, DO;  Location: [facility];  Service: Pulmonary;;   groin surgery Right 2010   growth removed    KNEE ARTHROSCOPY     left   KNEE ARTHROSCOPY WITH LATERAL MENISECTOMY Right 03/11/2015   Procedure: RIGHT KNEE ARTHROSCOPY WITH MEDIALAND LATERAL MENISECTOMY;  Surgeon: Lamar Millman, MD;  Location: [facility];  Service: Orthopedics;  Laterality: Right;   KNEE ARTHROSCOPY WITH MEDIAL MENISECTOMY Left 06/27/2014   Procedure: LEFT KNEE ARTHROSCOPY WITH PARTIAL MEDIAL AND LATERAL MENISECTOMIES AND CHONDROPLASTY;  Surgeon: Lamar DELENA Millman, MD;  Location: [facility];  Service: Orthopedics;  Laterality: Left;   KNEE ARTHROSCOPY WITH MEDIAL MENISECTOMY Right 03/11/2015   Procedure: KNEE ARTHROSCOPY WITH MEDIAL MENISECTOMY;  Surgeon: Lamar Millman, MD;  Location: [facility];  Service: Orthopedics;  Laterality: Right;   LEFT HEART CATHETERIZATION WITH CORONARY ANGIOGRAM N/A 09/14/2011   Procedure: LEFT  HEART CATHETERIZATION WITH CORONARY ANGIOGRAM;  Surgeon: Vinie KYM Maxcy, MD;  Location: [facility];  Service: Cardiovascular;  Laterality: N/A;   LYMPH NODE DISSECTION Left 03/22/2023   Procedure: LYMPH NODE DISSECTION;  Surgeon: Shyrl Linnie KIDD, MD;  Location: [facility];  Service: Thoracic;  Laterality: Left;   POLYPECTOMY  04/08/2016   Procedure: POLYPECTOMY;  Surgeon: Margo CROME Haddock, MD;  Location: [facility];  Service: Endoscopy;;  colon    POLYPECTOMY  12/01/2022   POLYPECTOMY  12/01/2022   Procedure: POLYPECTOMY;  Surgeon: Cindie Carlin POUR, DO;  Location: [facility];  Service: Endoscopy;;   RETINAL DETACHMENT SURGERY Left 2002   Dr. Carmela   Social History   Tobacco Use   Smoking status: Never    Passive exposure: Never   Smokeless tobacco: Never   Tobacco comments:    06/10/14 1968- Tried it once and didn't like it- AJ  Vaping Use   Vaping status: Never Used  Substance Use Topics   Alcohol  use: Yes    Alcohol /week: 0.0 standard drinks of alcohol     Comment: occasionally   Drug use: No   Family History  Problem Relation Age of Onset   Hypertension Mother  Hypertension Father    Heart attack Father    Diabetes Father    Asthma Daughter    Colon cancer Neg Hx    Stomach cancer Neg Hx    Allergies  Allergen Reactions   Avelox [Moxifloxacin] Other (See Comments)    Caused tendonitis   Penicillins Other (See Comments)    Chills     ROS    Objective:    BP (!) 152/100   Pulse 90   Ht 6' 5 (1.956 m)   Wt 213 lb 2 oz (96.7 kg)   SpO2 98%   BMI 25.27 kg/m  BP Readings from Last 3 Encounters:  06/04/24 (!) 152/100  04/11/24 (!) 155/90  03/25/24 135/85   Wt Readings from Last 3 Encounters:  06/04/24 213 lb 2 oz (96.7 kg)  04/11/24 203 lb 12.8 oz (92.4 kg)  03/25/24 206 lb (93.4 kg)    Physical Exam Vitals and nursing note reviewed.  Constitutional:      General: He is not in acute distress.    Appearance: Normal appearance.  HENT:      Head: Normocephalic.     Right Ear: Tympanic membrane, ear canal and external ear normal.     Left Ear: Tympanic membrane, ear canal and external ear normal.  Eyes:     Extraocular Movements: Extraocular movements intact.     Pupils: Pupils are equal, round, and reactive to light.  Cardiovascular:     Rate and Rhythm: Normal rate.     Heart sounds: Normal heart sounds.     Comments: Has occasional PVC during exam  Pulmonary:     Effort: Pulmonary effort is normal.     Breath sounds: Normal breath sounds. No wheezing.  Musculoskeletal:     Right lower leg: No edema.     Left lower leg: No edema.  Neurological:     General: No focal deficit present.     Mental Status: He is alert and oriented to person, place, and time.     Deep Tendon Reflexes: Reflexes are normal and symmetric.  Psychiatric:        Mood and Affect: Mood normal.        Behavior: Behavior normal.        Thought Content: Thought content normal.        Judgment: Judgment normal.      No results found for any visits on 06/04/24.

## 2024-06-05 ENCOUNTER — Ambulatory Visit: Payer: Self-pay | Admitting: Family Medicine

## 2024-06-05 ENCOUNTER — Other Ambulatory Visit: Payer: Self-pay

## 2024-06-05 LAB — IRON,TIBC AND FERRITIN PANEL
%SAT: 24 % (ref 20–48)
Ferritin: 55 ng/mL (ref 24–380)
Iron: 83 ug/dL (ref 50–180)
TIBC: 339 ug/dL (ref 250–425)

## 2024-06-05 LAB — CBC WITH DIFFERENTIAL/PLATELET
Absolute Lymphocytes: 842 {cells}/uL — ABNORMAL LOW (ref 850–3900)
Absolute Monocytes: 555 {cells}/uL (ref 200–950)
Basophils Absolute: 18 {cells}/uL (ref 0–200)
Basophils Relative: 0.3 %
Eosinophils Absolute: 439 {cells}/uL (ref 15–500)
Eosinophils Relative: 7.2 %
HCT: 41.5 % (ref 38.5–50.0)
Hemoglobin: 13.8 g/dL (ref 13.2–17.1)
MCH: 30 pg (ref 27.0–33.0)
MCHC: 33.3 g/dL (ref 32.0–36.0)
MCV: 90.2 fL (ref 80.0–100.0)
MPV: 10.7 fL (ref 7.5–12.5)
Monocytes Relative: 9.1 %
Neutro Abs: 4246 {cells}/uL (ref 1500–7800)
Neutrophils Relative %: 69.6 %
Platelets: 211 Thousand/uL (ref 140–400)
RBC: 4.6 Million/uL (ref 4.20–5.80)
RDW: 12.4 % (ref 11.0–15.0)
Total Lymphocyte: 13.8 %
WBC: 6.1 Thousand/uL (ref 3.8–10.8)

## 2024-06-05 LAB — COMPREHENSIVE METABOLIC PANEL WITH GFR
AG Ratio: 2 (calc) (ref 1.0–2.5)
ALT: 29 U/L (ref 9–46)
AST: 29 U/L (ref 10–35)
Albumin: 4.5 g/dL (ref 3.6–5.1)
Alkaline phosphatase (APISO): 71 U/L (ref 35–144)
BUN: 14 mg/dL (ref 7–25)
CO2: 29 mmol/L (ref 20–32)
Calcium: 9.3 mg/dL (ref 8.6–10.3)
Chloride: 104 mmol/L (ref 98–110)
Creat: 0.92 mg/dL (ref 0.70–1.28)
Globulin: 2.2 g/dL (ref 1.9–3.7)
Glucose, Bld: 95 mg/dL (ref 65–99)
Potassium: 3.8 mmol/L (ref 3.5–5.3)
Sodium: 140 mmol/L (ref 135–146)
Total Bilirubin: 0.5 mg/dL (ref 0.2–1.2)
Total Protein: 6.7 g/dL (ref 6.1–8.1)
eGFR: 86 mL/min/1.73m2

## 2024-06-05 LAB — HEMOGLOBIN A1C
Hgb A1c MFr Bld: 5.4 % (ref <5.7)
Mean Plasma Glucose: 108 mg/dL
eAG (mmol/L): 6 mmol/L

## 2024-06-05 LAB — LIPID PANEL
Cholesterol: 181 mg/dL (ref <200)
HDL: 45 mg/dL (ref >=40)
LDL Cholesterol (Calc): 107 mg/dL — ABNORMAL HIGH
Non-HDL Cholesterol (Calc): 136 mg/dL — ABNORMAL HIGH (ref <130)
Total CHOL/HDL Ratio: 4 (calc) (ref <5.0)
Triglycerides: 169 mg/dL — ABNORMAL HIGH (ref <150)

## 2024-06-05 LAB — MAGNESIUM: Magnesium: 2 mg/dL (ref 1.5–2.5)

## 2024-06-05 LAB — VITAMIN B12: Vitamin B-12: 402 pg/mL (ref 200–1100)

## 2024-06-05 LAB — VITAMIN D 25 HYDROXY (VIT D DEFICIENCY, FRACTURES): Vit D, 25-Hydroxy: 45 ng/mL (ref 30–100)

## 2024-06-05 LAB — TSH: TSH: 0.97 m[IU]/L (ref 0.40–4.50)

## 2024-06-05 MED ORDER — ROSUVASTATIN CALCIUM 20 MG PO TABS: 20.0000 mg | ORAL_TABLET | Freq: Every day | ORAL | 1 refills | Status: DC

## 2024-06-17 ENCOUNTER — Encounter: Payer: Self-pay | Admitting: Internal Medicine

## 2024-06-17 ENCOUNTER — Ambulatory Visit: Attending: Internal Medicine | Admitting: Internal Medicine

## 2024-06-17 VITALS — BP 132/80 | HR 74 | Ht 77.0 in | Wt 212.8 lb

## 2024-06-17 DIAGNOSIS — R9439 Abnormal result of other cardiovascular function study: Secondary | ICD-10-CM | POA: Insufficient documentation

## 2024-06-17 DIAGNOSIS — I10 Essential (primary) hypertension: Secondary | ICD-10-CM | POA: Diagnosis not present

## 2024-06-17 NOTE — Progress Notes (Signed)
 Cardiology Office Note  Date: 06/17/2024   ID: MASSEY RUHLAND, DOB Jul 26, 1946, MRN [id number]  PCP:  Aletha Bene, MD  Cardiologist:  Diannah SHAUNNA Maywood, MD Electrophysiologist:  None   Reason for Office Visit: Abnormal stress test   History of Present Illness: Micheal Baker is a 78 y.o. male known to have hypothyroidism, new diagnosis of lung cancer, OSA, history of frequent PVCs is here for follow-up of abnormal stress test.  Patient was initially referred to cardiology clinic in 2015 for evaluation of PVCs, frequent. He was then started on metoprolol  low-dose which was discontinued later on due to bradycardia.  LHC from 2013 for atypical chest pain showed angiographically normal epicardial coronary arteries. Echocardiogram from 2015 showed normal LVEF and no valvular heart disease. Patient was recently diagnosed with left lower lobe lung cancer (non-small cell lung cancer) in 02/2023. He also has prostatomegaly with prominent focal hypermetabolic activity on the PET scan, prostatitis versus prostate malignancy, will follow-up with urology for serial PSA levels. He was referred in 2024 for chest pain but that was deemed to be non-cardiac. He later underwent NM stress test prior to left lung surgery, no ischemia or infarction was present but nuclear LVEF 50%, intermediate risk.  He is referred back again for abnormal nuclear LVEF. No angina or DOE. Works out 3 times/week. No dizziness, palpitations, leg swelling.  Past Medical History:  Diagnosis Date   Acute medial meniscus tear of left knee    Depression    Frequent PVCs    GERD (gastroesophageal reflux disease)    History of hiatal hernia    History of inguinal hernia    History of kidney stones    Hypertension    Hypothyroidism    Lung cancer (HCC)    NSVT (nonsustained ventricular tachycardia) (HCC)    Osteoarthritis    Sleep apnea    Thyroid  disease     Past Surgical History:  Procedure Laterality Date   BIOPSY   12/01/2022   Procedure: BIOPSY;  Surgeon: Cindie Carlin POUR, DO;  [facility];  Service: Endoscopy;;   BRONCHIAL BIOPSY  02/07/2023   Procedure: BRONCHIAL BIOPSIES;  Surgeon: Brenna Adine CROME, DO;  [facility];  Service: Pulmonary;;   BRONCHIAL NEEDLE ASPIRATION BIOPSY  02/07/2023   Procedure: BRONCHIAL NEEDLE ASPIRATION BIOPSIES;  Surgeon: Brenna Adine CROME, DO;  [facility];  Service: Pulmonary;;   CATARACT EXTRACTION     left   COLONOSCOPY N/A 04/08/2016   Surgeon: Margo CROME Haddock, MD; one 6 mm polyp in the cecum removed, one 4 mm polyp in the descending colon removed, nonbleeding internal hemorrhoids.  Pathology with hyperplastic polyps.  Recommended colonoscopy in 10 years.   COLONOSCOPY  12/01/2022   COLONOSCOPY WITH PROPOFOL  N/A 12/01/2022   Procedure: COLONOSCOPY WITH PROPOFOL ;  Surgeon: Cindie Carlin POUR, DO;  [facility];  Service: Endoscopy;  Laterality: N/A;  8:15 am, asa 3   endosocopy  12/01/2022   ESOPHAGOGASTRODUODENOSCOPY (EGD) WITH PROPOFOL  N/A 12/01/2022   Procedure: ESOPHAGOGASTRODUODENOSCOPY (EGD) WITH PROPOFOL ;  Surgeon: Cindie Carlin POUR, DO;  [facility];  Service: Endoscopy;  Laterality: N/A;   EYE SURGERY     left-scar tissue   FIDUCIAL MARKER PLACEMENT  02/07/2023   Procedure: FIDUCIAL MARKER PLACEMENT;  Surgeon: Brenna Adine CROME, DO;  [facility];  Service: Pulmonary;;   groin surgery Right 2010   growth removed    KNEE ARTHROSCOPY     left   KNEE  ARTHROSCOPY WITH LATERAL MENISECTOMY Right 03/11/2015   Procedure: RIGHT KNEE ARTHROSCOPY WITH MEDIALAND LATERAL MENISECTOMY;  Surgeon: Lamar Millman, MD;  Location: [facility];  Service: Orthopedics;  Laterality: Right;   KNEE ARTHROSCOPY WITH MEDIAL MENISECTOMY Left 06/27/2014   Procedure: LEFT KNEE ARTHROSCOPY WITH PARTIAL MEDIAL AND LATERAL MENISECTOMIES AND CHONDROPLASTY;  Surgeon: Lamar DELENA Millman, MD;  Location: [facility];   Service: Orthopedics;  Laterality: Left;   KNEE ARTHROSCOPY WITH MEDIAL MENISECTOMY Right 03/11/2015   Procedure: KNEE ARTHROSCOPY WITH MEDIAL MENISECTOMY;  Surgeon: Lamar Millman, MD;  Location: [facility];  Service: Orthopedics;  Laterality: Right;   LEFT HEART CATHETERIZATION WITH CORONARY ANGIOGRAM N/A 09/14/2011   Procedure: LEFT HEART CATHETERIZATION WITH CORONARY ANGIOGRAM;  Surgeon: Vinie KYM Maxcy, MD;  Location: [facility];  Service: Cardiovascular;  Laterality: N/A;   LYMPH NODE DISSECTION Left 03/22/2023   Procedure: LYMPH NODE DISSECTION;  Surgeon: Shyrl Linnie KIDD, MD;  [facility];  Service: Thoracic;  Laterality: Left;   POLYPECTOMY  04/08/2016   Procedure: POLYPECTOMY;  Surgeon: Margo LITTIE Haddock, MD;  [facility];  Service: Endoscopy;;  colon    POLYPECTOMY  12/01/2022   POLYPECTOMY  12/01/2022   Procedure: POLYPECTOMY;  Surgeon: Cindie Carlin POUR, DO;  [facility];  Service: Endoscopy;;   RETINAL DETACHMENT SURGERY Left 2002   Dr. Carmela    Current Outpatient Medications  Medication Sig Dispense Refill   acetaminophen  (TYLENOL ) 500 MG tablet Take 500-1,000 mg by mouth every 6 (six) hours as needed for mild pain, headache or moderate pain.     ALPHA LIPOIC ACID PO Take 1 capsule by mouth in the morning.     B Complex-C (B-COMPLEX WITH VITAMIN C) tablet Take 1 tablet by mouth in the morning.     buPROPion  (WELLBUTRIN  XL) 300 MG 24 hr tablet Take 300 mg by mouth daily.     diclofenac Sodium (VOLTAREN) 1 % GEL Apply 1 Application topically 4 (four) times daily as needed (knee pain).     finasteride  (PROSCAR ) 5 MG tablet TAKE ONE TABLET BY MOUTH ONCE DAILY. 90 tablet 3   fluticasone  (FLONASE ) 50 MCG/ACT nasal spray Place 1 spray into both nostrils daily as needed for allergies. 18.2 mL 11   levothyroxine  (SYNTHROID ) 88 MCG tablet Take 1 tablet (88 mcg total) by mouth daily before breakfast. 30 tablet 2   losartan  (COZAAR ) 25 MG  tablet Take 1 tablet (25 mg total) by mouth daily. 90 tablet 1   meclizine  (ANTIVERT ) 25 MG tablet Take 1 tablet (25 mg total) by mouth 3 (three) times daily as needed for dizziness. 30 tablet 0   meloxicam  (MOBIC ) 15 MG tablet Take 1 tablet (15 mg total) by mouth daily. 30 tablet 3   pantoprazole  (PROTONIX ) 40 MG tablet TAKE ONE TABLET BY MOUTH ONCE DAILY. 90 tablet 3   Propylene Glycol (SYSTANE COMPLETE) 0.6 % SOLN Place 1 drop into both eyes as needed (dry eyes).     rosuvastatin  (CRESTOR ) 20 MG tablet Take 1 tablet (20 mg total) by mouth daily. 30 tablet 1   silodosin  (RAPAFLO ) 8 MG CAPS capsule Take 1 capsule (8 mg total) by mouth 2 (two) times daily. 60 capsule 11   No current facility-administered medications for this visit.   Allergies:  Avelox [moxifloxacin] and Penicillins   Social History: The patient  reports that he has never smoked. He has never been exposed to tobacco smoke. He has never used smokeless  tobacco. He reports current alcohol  use. He reports that he does not use drugs.   Family History: The patient's family history includes Asthma in his daughter; Diabetes in his father; Heart attack in his father; Hypertension in his father and mother.   ROS:  Please see the history of present illness. Otherwise, complete review of systems is positive for none  All other systems are reviewed and negative.   Physical Exam: VS:  Ht 6' 5 (1.956 m)   Wt 212 lb 12.8 oz (96.5 kg)   BMI 25.23 kg/m , BMI Body mass index is 25.23 kg/m.  Wt Readings from Last 3 Encounters:  06/17/24 212 lb 12.8 oz (96.5 kg)  06/04/24 213 lb 2 oz (96.7 kg)  04/11/24 203 lb 12.8 oz (92.4 kg)    General: Patient appears comfortable at rest. HEENT: Conjunctiva and lids normal, oropharynx clear with moist mucosa. Neck: Supple, no elevated JVP or carotid bruits, no thyromegaly. Lungs: Clear to auscultation, nonlabored breathing at rest. Cardiac: Regular rate and rhythm, no S3 or significant systolic  murmur, no pericardial rub. Abdomen: Soft, nontender, no hepatomegaly, bowel sounds present, no guarding or rebound. Extremities: No pitting edema, distal pulses 2+. Skin: Warm and dry. Musculoskeletal: No kyphosis. Neuropsychiatric: Alert and oriented x3, affect grossly appropriate.  Recent Labwork: 06/04/2024: ALT 29; AST 29; BUN 14; Creat 0.92; Hemoglobin 13.8; Magnesium 2.0; Platelets 211; Potassium 3.8; Sodium 140; TSH 0.97     Component Value Date/Time   CHOL 181 06/04/2024 1509   TRIG 169 (H) 06/04/2024 1509   HDL 45 06/04/2024 1509   CHOLHDL 4.0 06/04/2024 1509   LDLCALC 107 (H) 06/04/2024 1509     Assessment and Plan:  # Abnormal stress test - Patient was previously referred for chest pain that was deemed to be non-cardiac and further stress testing was deferred. He later underwent stress test in 02/2023 that showed no evidence of ischemia or infarction but nuclear LVEF was 50%. Intermediate risk based on nuclear LVEF. Obtain Echo. - LHC from 2013 showed angiographically normal epicardial coronary arteries. Echocardiogram from 2015 showed normal LVEF and no valvular heart disease.  No indication of cardiac stress testing at this time. - METS>4 in the last 3 months with no symptoms of angina or DOE.  # History PVCs -Patient was initially referred to cardiology clinic in 2015 for history of PVCs on the monitor during the procedure.  He was placed on metoprolol  low-dose but had to be discontinued later on due to bradycardia.  EKG today showed NSR and occasional PVCs.  Patient also denies any palpitations.  No further testing needed at this time.   I have spent a total of 20 minutes with patient reviewing chart, EKGs, labs and examining patient as well as establishing an assessment and plan that was discussed with the patient.  > 50% of time was spent in direct patient care.    Medication Adjustments/Labs and Tests Ordered: Current medicines are reviewed at length with the  patient today.  Concerns regarding medicines are outlined above.   Tests Ordered: Orders Placed This Encounter  Procedures   EKG 12-Lead    Medication Changes: No orders of the defined types were placed in this encounter.   Disposition:  Follow up pending results  Signed Mckinleigh Schuchart Priya Arinze Rivadeneira, MD, 06/17/2024 9:55 AM    [facility] [address] [place], [place], [place] [address]

## 2024-06-17 NOTE — Patient Instructions (Signed)
 Medication Instructions:  Your physician recommends that you continue on your current medications as directed. Please refer to the Current Medication list given to you today.   Labwork: None today  Testing/Procedures: Your physician has requested that you have an echocardiogram. Echocardiography is a painless test that uses sound waves to create images of your heart. It provides your doctor with information about the size and shape of your heart and how well your heart's chambers and valves are working. This procedure takes approximately one hour. There are no restrictions for this procedure. Please do NOT wear cologne, perfume, aftershave, or lotions (deodorant is allowed). Please arrive 15 minutes prior to your appointment time.  Please note: We ask at that you not bring children with you during ultrasound (echo/ vascular) testing. Due to room size and safety concerns, children are not allowed in the ultrasound rooms during exams. Our front office staff cannot provide observation of children in our lobby area while testing is being conducted. An adult accompanying a patient to their appointment will only be allowed in the ultrasound room at the discretion of the ultrasound technician under special circumstances. We apologize for any inconvenience.   Follow-Up: To be determined  Any Other Special Instructions Will Be Listed Below (If Applicable).  If you need a refill on your cardiac medications before your next appointment, please call your pharmacy.

## 2024-06-18 ENCOUNTER — Encounter: Payer: Self-pay | Admitting: Family Medicine

## 2024-06-18 ENCOUNTER — Ambulatory Visit: Admitting: Family Medicine

## 2024-06-18 VITALS — BP 126/80 | HR 68 | Temp 98.2°F | Ht 77.0 in | Wt 212.5 lb

## 2024-06-18 DIAGNOSIS — K2270 Barrett's esophagus without dysplasia: Secondary | ICD-10-CM | POA: Diagnosis not present

## 2024-06-18 DIAGNOSIS — I10 Essential (primary) hypertension: Secondary | ICD-10-CM

## 2024-06-18 DIAGNOSIS — I493 Ventricular premature depolarization: Secondary | ICD-10-CM

## 2024-06-18 DIAGNOSIS — M545 Low back pain, unspecified: Secondary | ICD-10-CM

## 2024-06-18 MED ORDER — CYCLOBENZAPRINE HCL 5 MG PO TABS: 5.0000 mg | ORAL_TABLET | Freq: Every day | ORAL | 1 refills | Status: AC

## 2024-06-18 MED ORDER — METHOCARBAMOL 500 MG PO TABS: 500.0000 mg | ORAL_TABLET | Freq: Three times a day (TID) | ORAL | 1 refills | Status: AC | PRN

## 2024-06-18 NOTE — Progress Notes (Signed)
 Patient Office Visit  Assessment & Plan:  Midline low back pain without sciatica, unspecified chronicity -     Methocarbamol; Take 1 tablet (500 mg total) by mouth every 8 (eight) hours as needed. During day for back spasm  Dispense: 30 tablet; Refill: 1 -     Cyclobenzaprine HCl; Take 1 tablet (5 mg total) by mouth at bedtime. For muscle spasms (night time)  Dispense: 30 tablet; Refill: 1  Barrett's esophagus without dysplasia  PVC (premature ventricular contraction)  Essential hypertension   Assessment and Plan    Chronic low back pain Pain exacerbated by activity, managed with ice and Tylenol . Previous relief with chiropractic care and TENS unit. - Prescribed Robaxin for daytime muscle tightness. - Prescribed Flexeril for nighttime muscle tension and sleep aid. - Instructed to take meloxicam  with food to prevent GI irritation. - Advised against Advil while on meloxicam . - Encouraged follow-up with chiropractor. - Consider physical therapy if needed.  Premature ventricular contractions (PVCs) No symptoms. Previous beta blocker discontinued due to bradycardia. Ejection fraction at 50%. Echocardiogram planned. patient did see cardiology and ECHO recommended.  - Proceed with echocardiogram.  Barrett's esophagus No symptoms. - Ensure meloxicam  is taken with food.          Return in about 6 months (around 12/17/2024), or if symptoms worsen or fail to improve.   Subjective:    Patient ID: Micheal Baker, male    DOB: 1946-03-21  Age: 78 y.o. MRN: [id number]  Chief Complaint  Patient presents with   Medical Management of Chronic Issues   Back Pain    Low back pain- started over the weekend.    Back Pain   Discussed the use of AI scribe software for clinical note transcription with the patient, who gave verbal consent to proceed.  History of Present Illness        History of Present Illness Micheal Baker is a 78 year old male who presents with lower back pain after  yard work started on Friday.  He has been experiencing lower back pain that began after engaging in yard work, including cutting trees, over the weekend. He worked cutting down trees from 9:30 AM to 12:30 PM, after which he noticed the pain. The pain is described as being across the lower back without radiation to the legs or buttocks. It is rated as 4-5/10 at rest and increases to 5-6/10 with movement. He has tried using ice and Tylenol  once a day for pain relief. The pain does not wake him up at night, and he is able to sleep. It is not exacerbated by walking but is uncomfortable when sitting in a recliner. He drives a car and does not find getting in and out to be difficult. He has a history of similar back pain episodes, which were previously managed with electrotherapy using a TENS unit, which he found helpful. Patient has an appointment with chiropractor tomorrow.  He mentions a history of waking up early, around 5:30 AM, for the past three months, unrelated to back pain. He engages in cardio exercises at the gym on Mondays, Wednesdays, and Fridays, which he feels improves his overall well-being.  He has a history of Barrett's esophagus and is cautious about medications that may irritate his stomach. He also has a history of PVCs and previously discontinued a beta blocker due to low heart rate.  Physical Exam MUSCULOSKELETAL: Spine tight on lateral bending. Spine tender to palpation. Motor strength normal. Calf pain on tiptoe  walking.  Results DIAGNOSTIC Nuclear stress test: Ejection fraction 50%  Assessment and Plan Chronic low back pain Pain exacerbated by activity, managed with ice and Tylenol . Previous relief with chiropractic care and TENS unit. - Prescribed Robaxin for daytime muscle tightness. - Prescribed Flexeril for nighttime muscle tension and sleep aid. - Instructed to take meloxicam  with food to prevent GI irritation. - Advised against Advil while on meloxicam . - Encouraged  follow-up with chiropractor. - Consider physical therapy if needed.  Premature ventricular contractions (PVCs) No symptoms. Previous beta blocker discontinued due to bradycardia. Ejection fraction at 50%. Echocardiogram planned. patient did see cardiology and ECHO recommended.  - Proceed with echocardiogram. Hypertension- BP much improved since starting Losartan  Barrett's esophagus No symptoms. - Ensure meloxicam  is taken with food.    The 10-year ASCVD risk score (Arnett DK, et al., 2019) is: 31.1%  Past Medical History:  Diagnosis Date   Acute medial meniscus tear of left knee    Depression    Frequent PVCs    GERD (gastroesophageal reflux disease)    History of hiatal hernia    History of inguinal hernia    History of kidney stones    Hypertension    Hypothyroidism    Lung cancer (HCC)    NSVT (nonsustained ventricular tachycardia) (HCC)    Osteoarthritis    Sleep apnea    Thyroid  disease    Past Surgical History:  Procedure Laterality Date   BIOPSY  12/01/2022   Procedure: BIOPSY;  Surgeon: Cindie Carlin POUR, DO;  [facility];  Service: Endoscopy;;   BRONCHIAL BIOPSY  02/07/2023   Procedure: BRONCHIAL BIOPSIES;  Surgeon: Brenna Adine CROME, DO;  [facility];  Service: Pulmonary;;   BRONCHIAL NEEDLE ASPIRATION BIOPSY  02/07/2023   Procedure: BRONCHIAL NEEDLE ASPIRATION BIOPSIES;  Surgeon: Brenna Adine CROME, DO;  [facility];  Service: Pulmonary;;   CATARACT EXTRACTION     left   COLONOSCOPY N/A 04/08/2016   Surgeon: Margo CROME Haddock, MD; one 6 mm polyp in the cecum removed, one 4 mm polyp in the descending colon removed, nonbleeding internal hemorrhoids.  Pathology with hyperplastic polyps.  Recommended colonoscopy in 10 years.   COLONOSCOPY  12/01/2022   COLONOSCOPY WITH PROPOFOL  N/A 12/01/2022   Procedure: COLONOSCOPY WITH PROPOFOL ;  Surgeon: Cindie Carlin POUR, DO;  [facility];  Service: Endoscopy;  Laterality: N/A;  8:15 am,  asa 3   endosocopy  12/01/2022   ESOPHAGOGASTRODUODENOSCOPY (EGD) WITH PROPOFOL  N/A 12/01/2022   Procedure: ESOPHAGOGASTRODUODENOSCOPY (EGD) WITH PROPOFOL ;  Surgeon: Cindie Carlin POUR, DO;  [facility];  Service: Endoscopy;  Laterality: N/A;   EYE SURGERY     left-scar tissue   FIDUCIAL MARKER PLACEMENT  02/07/2023   Procedure: FIDUCIAL MARKER PLACEMENT;  Surgeon: Brenna Adine CROME, DO;  [facility];  Service: Pulmonary;;   groin surgery Right 2010   growth removed    KNEE ARTHROSCOPY     left   KNEE ARTHROSCOPY WITH LATERAL MENISECTOMY Right 03/11/2015   Procedure: RIGHT KNEE ARTHROSCOPY WITH MEDIALAND LATERAL MENISECTOMY;  Surgeon: Lamar Millman, MD;  Location: [facility];  Service: Orthopedics;  Laterality: Right;   KNEE ARTHROSCOPY WITH MEDIAL MENISECTOMY Left 06/27/2014   Procedure: LEFT KNEE ARTHROSCOPY WITH PARTIAL MEDIAL AND LATERAL MENISECTOMIES AND CHONDROPLASTY;  Surgeon: Lamar DELENA Millman, MD;  Location: [facility];  Service: Orthopedics;  Laterality: Left;   KNEE ARTHROSCOPY WITH MEDIAL MENISECTOMY Right 03/11/2015   Procedure: KNEE ARTHROSCOPY WITH MEDIAL  MENISECTOMY;  Surgeon: Lamar Millman, MD;  Location: [facility];  Service: Orthopedics;  Laterality: Right;   LEFT HEART CATHETERIZATION WITH CORONARY ANGIOGRAM N/A 09/14/2011   Procedure: LEFT HEART CATHETERIZATION WITH CORONARY ANGIOGRAM;  Surgeon: Vinie KYM Maxcy, MD;  Location: [facility];  Service: Cardiovascular;  Laterality: N/A;   LYMPH NODE DISSECTION Left 03/22/2023   Procedure: LYMPH NODE DISSECTION;  Surgeon: Shyrl Linnie KIDD, MD;  [facility];  Service: Thoracic;  Laterality: Left;   POLYPECTOMY  04/08/2016   Procedure: POLYPECTOMY;  Surgeon: Margo LITTIE Haddock, MD;  [facility];  Service: Endoscopy;;  colon    POLYPECTOMY  12/01/2022   POLYPECTOMY  12/01/2022   Procedure: POLYPECTOMY;  Surgeon: Cindie Carlin POUR, DO;  [facility];  Service: Endoscopy;;   RETINAL DETACHMENT SURGERY Left 2002   Dr. Carmela   Social History   Tobacco Use   Smoking status: Never    Passive exposure: Never   Smokeless tobacco: Never   Tobacco comments:    06/10/14 1968- Tried it once and didn't like it- AJ  Vaping Use   Vaping status: Never Used  Substance Use Topics   Alcohol  use: Yes    Alcohol /week: 0.0 standard drinks of alcohol     Comment: occasionally   Drug use: No   Family History  Problem Relation Age of Onset   Hypertension Mother    Hypertension Father    Heart attack Father    Diabetes Father    Asthma Daughter    Colon cancer Neg Hx    Stomach cancer Neg Hx    Allergies  Allergen Reactions   Avelox [Moxifloxacin] Other (See Comments)    Caused tendonitis   Penicillins Other (See Comments)    Chills     Review of Systems  Musculoskeletal:  Positive for back pain.      Objective:    BP 126/80   Pulse 68   Temp 98.2 F (36.8 C)   Ht 6' 5 (1.956 m)   Wt 212 lb 8 oz (96.4 kg)   SpO2 99%   BMI 25.20 kg/m  BP Readings from Last 3 Encounters:  06/18/24 126/80  06/17/24 132/80  06/04/24 (!) 152/100   Wt Readings from Last 3 Encounters:  06/18/24 212 lb 8 oz (96.4 kg)  06/17/24 212 lb 12.8 oz (96.5 kg)  06/04/24 213 lb 2 oz (96.7 kg)    Physical Exam Vitals and nursing note reviewed.  Constitutional:      General: He is not in acute distress.    Appearance: Normal appearance.  HENT:     Head: Normocephalic.     Right Ear: Tympanic membrane, ear canal and external ear normal.     Left Ear: Tympanic membrane, ear canal and external ear normal.  Eyes:     Extraocular Movements: Extraocular movements intact.     Pupils: Pupils are equal, round, and reactive to light.  Cardiovascular:     Rate and Rhythm: Normal rate and regular rhythm.     Heart sounds: Normal heart sounds.  Pulmonary:     Effort: Pulmonary effort is normal.     Breath sounds: Normal breath sounds.   Musculoskeletal:     Lumbar back: Spasms, tenderness and bony tenderness present. Normal range of motion. Negative right straight leg raise test and negative left straight leg raise test. No scoliosis.     Right lower leg: No edema.     Left lower leg: No edema.  Neurological:     General: No focal deficit present.     Mental Status: He is alert and oriented to person, place, and time.     Deep Tendon Reflexes: Reflexes are normal and symmetric.  Psychiatric:        Mood and Affect: Mood normal.        Behavior: Behavior normal.      No results found for any visits on 06/18/24.

## 2024-07-08 ENCOUNTER — Ambulatory Visit (HOSPITAL_COMMUNITY)
Admission: RE | Admit: 2024-07-08 | Discharge: 2024-07-08 | Disposition: A | Discharge status: Home / Self Care | Source: Ambulatory Visit | Attending: Internal Medicine | Admitting: Internal Medicine

## 2024-07-08 DIAGNOSIS — Z85118 Personal history of other malignant neoplasm of bronchus and lung: Secondary | ICD-10-CM | POA: Insufficient documentation

## 2024-07-08 DIAGNOSIS — I77810 Thoracic aortic ectasia: Secondary | ICD-10-CM | POA: Diagnosis not present

## 2024-07-08 DIAGNOSIS — R9439 Abnormal result of other cardiovascular function study: Secondary | ICD-10-CM | POA: Diagnosis present

## 2024-07-08 DIAGNOSIS — I10 Essential (primary) hypertension: Secondary | ICD-10-CM | POA: Diagnosis not present

## 2024-07-08 DIAGNOSIS — I119 Hypertensive heart disease without heart failure: Secondary | ICD-10-CM | POA: Insufficient documentation

## 2024-07-08 DIAGNOSIS — I080 Rheumatic disorders of both mitral and aortic valves: Secondary | ICD-10-CM | POA: Diagnosis not present

## 2024-07-08 DIAGNOSIS — G4730 Sleep apnea, unspecified: Secondary | ICD-10-CM | POA: Diagnosis not present

## 2024-07-08 LAB — ECHOCARDIOGRAM COMPLETE
Area-P 1/2: 2.37 cm2
S' Lateral: 3.4 cm

## 2024-07-08 NOTE — Progress Notes (Signed)
*  PRELIMINARY RESULTS* Echocardiogram 2D Echocardiogram has been performed.  Micheal Baker 07/08/2024, 10:23 AM

## 2024-07-19 ENCOUNTER — Ambulatory Visit: Payer: Self-pay

## 2024-07-19 ENCOUNTER — Telehealth: Payer: Self-pay

## 2024-07-19 ENCOUNTER — Telehealth: Payer: Self-pay | Admitting: Internal Medicine

## 2024-07-19 ENCOUNTER — Ambulatory Visit
Admission: EM | Admit: 2024-07-19 | Discharge: 2024-07-19 | Disposition: A | Discharge status: Home / Self Care | Attending: Nurse Practitioner | Admitting: Nurse Practitioner

## 2024-07-19 DIAGNOSIS — R062 Wheezing: Secondary | ICD-10-CM

## 2024-07-19 DIAGNOSIS — J069 Acute upper respiratory infection, unspecified: Secondary | ICD-10-CM | POA: Diagnosis not present

## 2024-07-19 LAB — POC SOFIA SARS ANTIGEN FIA: SARS Coronavirus 2 Ag: NEGATIVE

## 2024-07-19 MED ORDER — PREDNISONE 20 MG PO TABS: 40.0000 mg | ORAL_TABLET | Freq: Every day | ORAL | 0 refills | Status: AC

## 2024-07-19 MED ORDER — METHYLPREDNISOLONE ACETATE 40 MG/ML IJ SUSP
40.0000 mg | Freq: Once | INTRAMUSCULAR | Status: AC
  Administered 2024-07-19: 40 mg via INTRAMUSCULAR

## 2024-07-19 MED ORDER — BENZONATATE 100 MG PO CAPS: 100.0000 mg | ORAL_CAPSULE | Freq: Three times a day (TID) | ORAL | 0 refills | Status: AC | PRN

## 2024-07-19 NOTE — Telephone Encounter (Signed)
 Message from Champ P sent at 07/19/2024  7:43 AM EST  Reason for Triage: PT called in for congestion, causing pain in the ears. Stuffed up nose. Says if he dont treat soon gets real bad. Pt said he doesn't know what to do. PT looking for advice.   Phone call placed to patient which went directly to voicemail. Message left for patient to call back to Nurse Triage

## 2024-07-19 NOTE — Telephone Encounter (Signed)
 Copied from CRM [id number]. Topic: General - Other >> Jul 19, 2024 10:21 AM Delon DASEN wrote: Reason for CRM: Patient went to urgent care, wanted to inform the Dr Aletha.

## 2024-07-19 NOTE — ED Provider Notes (Signed)
 [facility]    CSN: [id number] Arrival date & time: 07/19/24  0905      History   Chief Complaint No chief complaint on file.   HPI Micheal Baker is a 78 y.o. male.   Patient presents today with 2-day history of chills, dry cough, stuffy nose, sore throat, headache, bilateral ear pressure, and fatigue.  No known fevers, shortness of breath or chest pain, runny nose, abdominal pain, nausea/vomiting, diarrhea, or change in appetite.  No known sick contacts.  Has taken over-the-counter severe cold and flu medicine with acetaminophen  and it without much improvement.    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   Lung cancer (HCC) 09/26/2023   Pain in finger 09/25/2023   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/22/2023   S/P lobectomy of lung 03/22/2023   Amnesia 02/08/2023   Lung nodule 01/19/2023   GERD (gastroesophageal reflux disease) 12/25/2022   Anxiety and depression 12/25/2022   Sepsis due to urinary tract infection (HCC) 12/24/2022   Constipation 10/27/2022   Pain of upper abdomen 10/27/2022   Left leg pain 09/21/2022   Urinary tract infection associated with indwelling urethral catheter    Severe sepsis (HCC) 01/30/2022   BPH (benign prostatic hyperplasia) 01/30/2022   Acquired trigger finger of left index finger 02/04/2021   Bilateral carpal tunnel syndrome 02/04/2021   Acute lateral meniscus tear of left knee 06/27/2014    Acute medial meniscus tear of left knee    NSVT (nonsustained ventricular tachycardia) (HCC)    PVC (premature ventricular contraction) 06/10/2014   Non-cardiac chest pain 08/08/2011   Essential hypertension 08/08/2011   Hypothyroidism 08/08/2011    Past Surgical History:  Procedure Laterality Date   BIOPSY  12/01/2022   Procedure: BIOPSY;  Surgeon: Cindie Carlin POUR, DO;  [facility];  Service: Endoscopy;;   BRONCHIAL BIOPSY  02/07/2023   Procedure: BRONCHIAL BIOPSIES;  Surgeon: Brenna Adine CROME, DO;  [facility]: [facility];  Service: Pulmonary;;   BRONCHIAL NEEDLE ASPIRATION BIOPSY  02/07/2023   Procedure: BRONCHIAL NEEDLE ASPIRATION BIOPSIES;  Surgeon: Brenna Adine CROME, DO;  Location: [facility];  Service: Pulmonary;;   CATARACT EXTRACTION     left   COLONOSCOPY N/A 04/08/2016   Surgeon: Margo CROME Haddock, MD; one 6 mm polyp in the cecum removed, one 4 mm polyp in the descending colon removed, nonbleeding internal hemorrhoids.  Pathology with hyperplastic polyps.  Recommended colonoscopy in 10 years.   COLONOSCOPY  12/01/2022   COLONOSCOPY WITH PROPOFOL  N/A 12/01/2022   Procedure: COLONOSCOPY WITH PROPOFOL ;  Surgeon: Cindie Carlin POUR, DO;  [facility];  Service: Endoscopy;  Laterality: N/A;  8:15 am, asa 3   endosocopy  12/01/2022   ESOPHAGOGASTRODUODENOSCOPY (EGD) WITH PROPOFOL  N/A 12/01/2022   Procedure: ESOPHAGOGASTRODUODENOSCOPY (EGD) WITH PROPOFOL ;  Surgeon: Cindie Carlin POUR, DO;  [facility];  Service: Endoscopy;  Laterality: N/A;   EYE SURGERY     left-scar tissue   FIDUCIAL MARKER PLACEMENT  02/07/2023   Procedure: FIDUCIAL MARKER PLACEMENT;  Surgeon: Brenna Adine CROME, DO;  [facility];  Service: Pulmonary;;   groin surgery Right 2010   growth removed    KNEE ARTHROSCOPY     left   KNEE ARTHROSCOPY WITH LATERAL MENISECTOMY Right 03/11/2015   Procedure: RIGHT KNEE ARTHROSCOPY WITH MEDIALAND LATERAL MENISECTOMY;  Surgeon: Lamar Millman, MD;  Location: [facility];  Service: Orthopedics;  Laterality: Right;   KNEE ARTHROSCOPY WITH MEDIAL MENISECTOMY Left 06/27/2014   Procedure: LEFT KNEE ARTHROSCOPY WITH PARTIAL MEDIAL AND LATERAL MENISECTOMIES AND CHONDROPLASTY;  Surgeon: Lamar DELENA Millman, MD;  Location: [facility];  Service: Orthopedics;  Laterality: Left;   KNEE ARTHROSCOPY WITH MEDIAL MENISECTOMY Right 03/11/2015   Procedure: KNEE ARTHROSCOPY WITH MEDIAL MENISECTOMY;  Surgeon: Lamar Millman, MD;  Location: [facility];  Service: Orthopedics;  Laterality: Right;   LEFT HEART CATHETERIZATION WITH CORONARY ANGIOGRAM N/A 09/14/2011   Procedure: LEFT HEART CATHETERIZATION WITH CORONARY ANGIOGRAM;  Surgeon: Vinie KYM Maxcy, MD;  Location: [facility];  Service: Cardiovascular;  Laterality: N/A;   LYMPH NODE DISSECTION Left 03/22/2023   Procedure: LYMPH NODE DISSECTION;  Surgeon: Shyrl Linnie KIDD, MD;  Location[facility];  Service: Thoracic;  Laterality: Left;   POLYPECTOMY  04/08/2016   Procedure: POLYPECTOMY;  Surgeon: Margo CROME Haddock, MD;  [facility];  Service: Endoscopy;;  colon    POLYPECTOMY  12/01/2022   POLYPECTOMY  12/01/2022   Procedure: POLYPECTOMY;  Surgeon: Cindie Carlin POUR, DO;  [facility];  Service: Endoscopy;;   RETINAL DETACHMENT SURGERY Left 2002   Dr. Carmela       Home Medications    Prior to Admission medications   Medication Sig Start Date End Date Taking? Authorizing Provider  benzonatate  (TESSALON ) 100 MG capsule Take 1 capsule (100 mg total) by mouth 3 (three) times daily as needed for cough. Do not take with alcohol  or while operating or driving heavy machinery 88/2/74  Yes Chandra Raisin A, NP  predniSONE  (DELTASONE ) 20 MG tablet Take 2 tablets (40 mg total) by mouth daily with breakfast for 5 days. 07/19/24 07/24/24 Yes Chandra Raisin DELENA, NP  acetaminophen  (TYLENOL ) 500 MG tablet Take 500-1,000 mg by mouth every 6  (six) hours as needed for mild pain, headache or moderate pain.    [provider]  ALPHA LIPOIC ACID PO Take 1 capsule by mouth in the morning.    [provider]  B Complex-C (B-COMPLEX WITH VITAMIN C) tablet Take 1 tablet by mouth in the morning.    [provider]  buPROPion  (WELLBUTRIN  XL) 300 MG 24 hr tablet Take 300 mg by mouth daily. 02/06/24   [provider]  cyclobenzaprine (FLEXERIL) 5 MG tablet Take 1 tablet (5 mg total) by mouth at bedtime. For muscle spasms (night time) 06/18/24   Aletha Bene, MD  diclofenac Sodium (VOLTAREN) 1 % GEL Apply 1 Application topically 4 (four) times daily as needed (knee pain).    [provider]  finasteride  (PROSCAR ) 5 MG tablet TAKE ONE TABLET BY MOUTH ONCE DAILY. 12/08/23   McKenzie, Belvie CROME, MD  fluticasone  (FLONASE ) 50 MCG/ACT nasal spray Place 1 spray into both nostrils daily as needed for allergies. 06/04/24   Aletha Bene, MD  levothyroxine  (SYNTHROID ) 88 MCG tablet Take 1 tablet (88 mcg  total) by mouth daily before breakfast. 02/09/23 06/18/24  Pearlean Manus, MD  losartan  (COZAAR ) 25 MG tablet Take 1 tablet (25 mg total) by mouth daily. 06/04/24   Aletha Bene, MD  meclizine  (ANTIVERT ) 25 MG tablet Take 1 tablet (25 mg total) by mouth 3 (three) times daily as needed for dizziness. 02/14/24   Dean Clarity, MD  meloxicam  (MOBIC ) 15 MG tablet Take 1 tablet (15 mg total) by mouth daily. 10/09/23   Hyatt, Max T, DPM  methocarbamol (ROBAXIN) 500 MG tablet Take 1 tablet (500 mg total) by mouth every 8 (eight) hours as needed. During day for back spasm 06/18/24   Aletha Bene, MD  pantoprazole  (PROTONIX ) 40 MG tablet TAKE ONE TABLET BY MOUTH ONCE DAILY. 04/29/24   Kennedy Charmaine CROME, NP  Propylene Glycol (SYSTANE COMPLETE) 0.6 % SOLN Place 1 drop into both eyes as needed (dry eyes).    [provider]  rosuvastatin  (CRESTOR ) 20 MG tablet Take 1 tablet (20 mg total) by mouth daily. 06/05/24    Aletha Bene, MD  silodosin  (RAPAFLO ) 8 MG CAPS capsule Take 1 capsule (8 mg total) by mouth 2 (two) times daily. 10/06/23   McKenzie, Belvie CROME, MD    Family History Family History  Problem Relation Age of Onset   Hypertension Mother    Hypertension Father    Heart attack Father    Diabetes Father    Asthma Daughter    Colon cancer Neg Hx    Stomach cancer Neg Hx     Social History Social History   Tobacco Use   Smoking status: Never    Passive exposure: Never   Smokeless tobacco: Never   Tobacco comments:    06/10/14 1968- Tried it once and didn't like it- AJ  Vaping Use   Vaping status: Never Used  Substance Use Topics   Alcohol  use: Yes    Alcohol /week: 0.0 standard drinks of alcohol     Comment: occasionally   Drug use: No     Allergies   Avelox [moxifloxacin] and Penicillins   Review of Systems Review of Systems Per HPI  Physical Exam Triage Vital Signs ED Triage Vitals  Encounter Vitals Group     BP 07/19/24 0917 (!) 145/80     Girls Systolic BP Percentile --      Girls Diastolic BP Percentile --      Boys Systolic BP Percentile --      Boys Diastolic BP Percentile --      Pulse Rate 07/19/24 0917 81     Resp 07/19/24 0917 18     Temp 07/19/24 0917 97.7 F (36.5 C)     Temp Source 07/19/24 0917 Oral     SpO2 07/19/24 0917 94 %     Weight --      Height --      Head Circumference --      Peak Flow --      Pain Score 07/19/24 0914 4     Pain Loc --      Pain Education --      Exclude from Growth Chart --    No data found.  Updated Vital Signs BP (!) 145/80 (BP Location: Right Arm)   Pulse 81   Temp 97.7 F (36.5 C) (Oral)   Resp 18   SpO2 94%   Visual Acuity Right Eye Distance:   Left Eye Distance:   Bilateral Distance:    Right Eye Near:   Left Eye Near:  Bilateral Near:     Physical Exam Vitals and nursing note reviewed.  Constitutional:      General: He is not in acute distress.    Appearance: Normal appearance. He is  not ill-appearing or toxic-appearing.  HENT:     Head: Normocephalic and atraumatic.     Right Ear: Ear canal and external ear normal. A middle ear effusion is present.     Left Ear: Ear canal and external ear normal. A middle ear effusion is present.     Nose: No congestion or rhinorrhea.     Mouth/Throat:     Mouth: Mucous membranes are moist.     Pharynx: Oropharynx is clear. Postnasal drip present. No oropharyngeal exudate or posterior oropharyngeal erythema.  Eyes:     General: No scleral icterus.    Extraocular Movements: Extraocular movements intact.  Cardiovascular:     Rate and Rhythm: Normal rate and regular rhythm.  Pulmonary:     Effort: Pulmonary effort is normal. No respiratory distress.     Breath sounds: Wheezing present. No rhonchi or rales.  Musculoskeletal:     Cervical back: Normal range of motion and neck supple.  Lymphadenopathy:     Cervical: No cervical adenopathy.  Skin:    General: Skin is warm and dry.     Coloration: Skin is not jaundiced or pale.     Findings: No erythema or rash.  Neurological:     Mental Status: He is alert and oriented to person, place, and time.  Psychiatric:        Behavior: Behavior is cooperative.      UC Treatments / Results  Labs (all labs ordered are listed, but only abnormal results are displayed) Labs Reviewed  POC SOFIA SARS ANTIGEN FIA    EKG   Radiology No results found.  Procedures Procedures (including critical care time)  Medications Ordered in UC Medications  methylPREDNISolone  acetate (DEPO-MEDROL ) injection 40 mg (40 mg Intramuscular Given 07/19/24 0955)    Initial Impression / Assessment and Plan / UC Course  I have reviewed the triage vital signs and the nursing notes.  Pertinent labs & imaging results that were available during my care of the patient were reviewed by me and considered in my medical decision making (see chart for details).   Patient is well-appearing, normotensive, afebrile,  not tachycardic, not tachypneic, oxygenating well on room air.   1. Viral URI with cough 2. Wheezing Vitals are reassuring COVID-19 testing is negative Given wheezing on exam and left lobe along with bilateral ear effusions, will treat with IM Depo-Medrol , oral prednisone  Additionally, start cough suppressant medication and other supportive measures discussed including guaifenesin  if he develops congestion Return and ER precautions discussed with patient  The patient was given the opportunity to ask questions.  All questions answered to their satisfaction.  The patient is in agreement to this plan.   Final Clinical Impressions(s) / UC Diagnoses   Final diagnoses:  Viral URI with cough  Wheezing     Discharge Instructions      You have a viral upper respiratory infection that is causing inflammation in your airway.  We gave you an injection of steroid medicine today, start the oral prednisone  tomorrow morning.  COVID-19 test is negative today.  Symptoms should improve over the next week to 10 days.  If you develop chest pain or shortness of breath, go to the emergency room.  Some other things that can make you feel better are: - Increased rest -  Increasing fluid with water /sugar free electrolytes - Acetaminophen  and ibuprofen as needed for fever/pain - Salt water  gargling, chloraseptic spray and throat lozenges - OTC guaifenesin  (Mucinex ) 600 mg twice daily - Saline sinus flushes or a neti pot - Humidifying the air -Tessalon  Perles every 8 hours as needed for dry cough      ED Prescriptions     Medication Sig Dispense Auth. Provider   predniSONE  (DELTASONE ) 20 MG tablet Take 2 tablets (40 mg total) by mouth daily with breakfast for 5 days. 10 tablet Chandra Raisin A, NP   benzonatate  (TESSALON ) 100 MG capsule Take 1 capsule (100 mg total) by mouth 3 (three) times daily as needed for cough. Do not take with alcohol  or while operating or driving heavy machinery 21 capsule  Chandra Raisin LABOR, NP      PDMP not reviewed this encounter.   Chandra Raisin LABOR, NP 07/19/24 1037

## 2024-07-19 NOTE — Telephone Encounter (Signed)
 No triage completed, pt was seen at [facility] prior to NT call back.  Reason for Disposition  [1] Follow-up call to recent contact AND [2] information only call, no triage required  Answer Assessment - Initial Assessment Questions 1. REASON FOR CALL: What is the main reason for your call? or How can I best help you?     Patient was evaluated in UC prior to contact with NT. Advised to call back if symptoms do not improve, if they worsen, or if they resolve and then return.  Protocols used: Information Only Call - No Triage-A-AH

## 2024-07-19 NOTE — Telephone Encounter (Signed)
Patient called to follow-up on his test results. 

## 2024-07-19 NOTE — Discharge Instructions (Addendum)
 You have a viral upper respiratory infection that is causing inflammation in your airway.  We gave you an injection of steroid medicine today, start the oral prednisone  tomorrow morning.  COVID-19 test is negative today.  Symptoms should improve over the next week to 10 days.  If you develop chest pain or shortness of breath, go to the emergency room.  Some other things that can make you feel better are: - Increased rest - Increasing fluid with water /sugar free electrolytes - Acetaminophen  and ibuprofen as needed for fever/pain - Salt water  gargling, chloraseptic spray and throat lozenges - OTC guaifenesin  (Mucinex ) 600 mg twice daily - Saline sinus flushes or a neti pot - Humidifying the air -Tessalon  Perles every 8 hours as needed for dry cough

## 2024-07-19 NOTE — ED Triage Notes (Signed)
 Pt reports he has a headache, bilateral ear pain, congestion, and a cough x 2  days   Took otc cold meds

## 2024-07-22 NOTE — Telephone Encounter (Signed)
 The patient has been notified of the result and verbalized understanding.  All questions (if any) were answered. Rosina JAYSON Cornea, CMA 07/22/2024 2:09 PM

## 2024-07-25 ENCOUNTER — Ambulatory Visit: Payer: Self-pay | Admitting: Internal Medicine

## 2024-07-26 ENCOUNTER — Other Ambulatory Visit: Payer: Self-pay | Admitting: Podiatry

## 2024-07-29 ENCOUNTER — Other Ambulatory Visit: Payer: Self-pay

## 2024-07-29 NOTE — Telephone Encounter (Signed)
 Prescription Request  07/29/2024  LOV: 06/18/24  What is the name of the medication or equipment? rosuvastatin  (CRESTOR ) 20 MG tablet [[id number]]   Have you contacted your pharmacy to request a refill? Yes   Which pharmacy would you like this sent to?  [facility] [place], [place] - [id number] [place] [address] Phone: [phone] Fax: [phone]    Patient notified that their request is being sent to the clinical staff for review and that they should receive a response within 2 business days.   Please advise at [facility] [phone]

## 2024-07-31 ENCOUNTER — Ambulatory Visit: Payer: Self-pay

## 2024-07-31 ENCOUNTER — Ambulatory Visit: Admitting: Family Medicine

## 2024-07-31 ENCOUNTER — Encounter: Payer: Self-pay | Admitting: Family Medicine

## 2024-07-31 VITALS — BP 137/87 | HR 73 | Temp 97.7°F | Ht 77.0 in | Wt 211.4 lb

## 2024-07-31 DIAGNOSIS — J069 Acute upper respiratory infection, unspecified: Secondary | ICD-10-CM

## 2024-07-31 MED ORDER — ROSUVASTATIN CALCIUM 20 MG PO TABS: 20.0000 mg | ORAL_TABLET | Freq: Every day | ORAL | 0 refills | Status: AC

## 2024-07-31 NOTE — Telephone Encounter (Signed)
 Requested Prescriptions  Pending Prescriptions Disp Refills   rosuvastatin  (CRESTOR ) 20 MG tablet 90 tablet 0    Sig: Take 1 tablet (20 mg total) by mouth daily.     Cardiovascular:  Antilipid - Statins 2 Failed - 07/31/2024  9:45 AM      Failed - Lipid Panel in normal range within the last 12 months    Cholesterol  Date Value Ref Range Status  06/04/2024 181 <200 mg/dL Final   LDL Cholesterol (Calc)  Date Value Ref Range Status  06/04/2024 107 (H) mg/dL (calc) Final    Comment:    Reference range: <100 . Desirable range <100 mg/dL for primary prevention;   <70 mg/dL for patients with CHD or diabetic patients  with > or = 2 CHD risk factors. SABRA LDL-C is now calculated using the Martin-Hopkins  calculation, which is a validated novel method providing  better accuracy than the Friedewald equation in the  estimation of LDL-C.  Gladis APPLETHWAITE et al. SANDREA. 7986;689(80): 2061-2068  (http://education.[employer].com/faq/FAQ164)    HDL  Date Value Ref Range Status  06/04/2024 45 > OR = 40 mg/dL Final   Triglycerides  Date Value Ref Range Status  06/04/2024 169 (H) <150 mg/dL Final         Passed - Cr in normal range and within 360 days    Creat  Date Value Ref Range Status  06/04/2024 0.92 0.70 - 1.28 mg/dL Final         Passed - Patient is not pregnant      Passed - Valid encounter within last 12 months    Recent Outpatient Visits           1 month ago Midline low back pain without sciatica, unspecified chronicity   [place] [facility] Aletha Bene, MD   1 month ago Encounter to establish care   [place] [facility] Aletha Bene, MD       Future Appointments             In 3 months McKenzie, Belvie CROME, MD [facility] [place]

## 2024-07-31 NOTE — Telephone Encounter (Signed)
 FYI Only or Action Required?: FYI only for provider: appointment scheduled on 07/31/2024.  Patient was last seen in primary care on 06/18/2024 by Micheal Bene, MD.  Called Nurse Triage reporting Cough, Nasal Congestion, and Otalgia.  Symptoms began several weeks ago.- symptoms improved after visiting walkin clinic, but have recently worsened  Interventions attempted: Prescription medications: steroid and antibiotics.  Symptoms are: gradually worsening.  Triage Disposition: See PCP When Office is Open (Within 3 Days)  Patient/caregiver understands and will follow disposition?: Yes  Copied from CRM [id number]. Topic: Clinical - Red Word Triage >> Jul 31, 2024  9:54 AM Micheal Baker wrote: Red Word that prompted transfer to Nurse Triage: Patient states he had walking pneumonia last time he experienced theses symptoms  Symptoms: Throat and ear pain, congestion, cough, low grade fever Reason for Disposition  [1] Nasal discharge AND [2] present > 10 days  Answer Assessment - Initial Assessment Questions 1. ONSET: When did the cough begin?      Symptoms began two weeks ago, improved after visiting walk in clinic and receiving antibiotics and steroids.  Symptoms came back yesterday.    3. SPUTUM: Describe the color of your sputum (e.g., none, dry cough; clear, white, yellow, green)     denies 4. HEMOPTYSIS: Are you coughing up any blood? If Yes, ask: How much? (e.g., flecks, streaks, tablespoons, etc.)     N/A 5. DIFFICULTY BREATHING: Are you having difficulty breathing? If Yes, ask: How bad is it? (e.g., mild, moderate, severe)      denies 6. FEVER: Do you have a fever? If Yes, ask: What is your temperature, how was it measured, and when did it start?     Feels feverish, temp not measured 7. CARDIAC HISTORY: Do you have any history of heart disease? (e.g., heart attack, congestive heart failure)      HTN 8. LUNG HISTORY: Do you have any history of lung disease?  (e.g.,  pulmonary embolus, asthma, emphysema)     OSA 9. PE RISK FACTORS: Do you have a history of blood clots? (or: recent major surgery, recent prolonged travel, bedridden)     N/a 10. OTHER SYMPTOMS: Do you have any other symptoms? (e.g., runny nose, wheezing, chest pain)       Bilateral ear pain  12. TRAVEL: Have you traveled out of the country in the last month? (e.g., travel history, exposures)       denies  Protocols used: Cough - Acute Non-Productive-A-AH

## 2024-07-31 NOTE — Addendum Note (Signed)
 Addended by: KAYLA JEOFFREY RAMAN on: 07/31/2024 03:24 PM   Modules accepted: Orders

## 2024-07-31 NOTE — Progress Notes (Addendum)
 Acute Office Visit  Patient ID: Micheal Baker, male    DOB: 14-Dec-1945, 78 y.o.   MRN: [id number]  PCP: Aletha Bene, MD  Chief Complaint  Patient presents with   Acute Visit    Cough nasal congestion x 2 weeks , ear pain since yesterday      Subjective:     HPI  Discussed the use of AI scribe software for clinical note transcription with the patient, who gave verbal consent to proceed.  History of Present Illness Micheal Baker is a 78 year old male who presents with a new onset of cough and ear pain.  He has experienced a new onset of dry cough and mild ear pain since yesterday morning. The ear pain is less severe than during a previous episode. He also notes a sore throat that is present but not yet painful. He has felt 'off' since yesterday afternoon.  Approximately two weeks ago, he experienced symptoms including chills, cough, stuffy nose, sore throat, headache, ear pressure, and fatigue, which were presumed to be viral. He received a steroid shot and was prescribed benzonatate  for cough and prednisone , which he took as directed. These treatments eventually led to improvement, allowing him to return to his normal activities, including going to the gym and attending classes.  He has not experienced any fever, chills, or body aches with the current symptoms. He has a history of using a CPAP machine and has been using Flonase  for nasal congestion. He is taking benzonatate  every six hours, which he believes may be slowing down the cough, although it is not keeping him up at night.  In the past, he has experienced worsening symptoms when attempting to manage ear-related issues with over-the-counter medications, leading to more severe discomfort.  No shortness of breath, wheezing, fever, chills, or body aches. He reports nasal congestion but no rhinorrhea.   Review of Systems  All other systems reviewed and are negative.   Past Medical History:  Diagnosis Date   Acute medial  meniscus tear of left knee    Depression    Frequent PVCs    GERD (gastroesophageal reflux disease)    History of hiatal hernia    History of inguinal hernia    History of kidney stones    Hypertension    Hypothyroidism    Lung cancer (HCC)    NSVT (nonsustained ventricular tachycardia) (HCC)    Osteoarthritis    Sleep apnea    Thyroid  disease     Past Surgical History:  Procedure Laterality Date   BIOPSY  12/01/2022   Procedure: BIOPSY;  Surgeon: Cindie Carlin POUR, DO;  [facility];  Service: Endoscopy;;   BRONCHIAL BIOPSY  02/07/2023   Procedure: BRONCHIAL BIOPSIES;  Surgeon: Brenna Adine CROME, DO;  [facility];  Service: Pulmonary;;   BRONCHIAL NEEDLE ASPIRATION BIOPSY  02/07/2023   Procedure: BRONCHIAL NEEDLE ASPIRATION BIOPSIES;  Surgeon: Brenna Adine CROME, DO;  [facility];  Service: Pulmonary;;   CATARACT EXTRACTION     left   COLONOSCOPY N/A 04/08/2016   Surgeon: Margo CROME Haddock, MD; one 6 mm polyp in the cecum removed, one 4 mm polyp in the descending colon removed, nonbleeding internal hemorrhoids.  Pathology with hyperplastic polyps.  Recommended colonoscopy in 10 years.   COLONOSCOPY  12/01/2022   COLONOSCOPY WITH PROPOFOL  N/A 12/01/2022   Procedure: COLONOSCOPY WITH PROPOFOL ;  Surgeon: Cindie Carlin POUR, DO;  [facility];  Service: Endoscopy;  Laterality: N/A;  8:15  am, asa 3   endosocopy  12/01/2022   ESOPHAGOGASTRODUODENOSCOPY (EGD) WITH PROPOFOL  N/A 12/01/2022   Procedure: ESOPHAGOGASTRODUODENOSCOPY (EGD) WITH PROPOFOL ;  Surgeon: Cindie Carlin POUR, DO;  [facility];  Service: Endoscopy;  Laterality: N/A;   EYE SURGERY     left-scar tissue   FIDUCIAL MARKER PLACEMENT  02/07/2023   Procedure: FIDUCIAL MARKER PLACEMENT;  Surgeon: Brenna Adine CROME, DO;  [facility];  Service: Pulmonary;;   groin surgery Right 2010   growth removed    KNEE ARTHROSCOPY     left   KNEE ARTHROSCOPY WITH LATERAL MENISECTOMY  Right 03/11/2015   Procedure: RIGHT KNEE ARTHROSCOPY WITH MEDIALAND LATERAL MENISECTOMY;  Surgeon: Lamar Millman, MD;  Location: [facility];  Service: Orthopedics;  Laterality: Right;   KNEE ARTHROSCOPY WITH MEDIAL MENISECTOMY Left 06/27/2014   Procedure: LEFT KNEE ARTHROSCOPY WITH PARTIAL MEDIAL AND LATERAL MENISECTOMIES AND CHONDROPLASTY;  Surgeon: Lamar DELENA Millman, MD;  Location: [facility];  Service: Orthopedics;  Laterality: Left;   KNEE ARTHROSCOPY WITH MEDIAL MENISECTOMY Right 03/11/2015   Procedure: KNEE ARTHROSCOPY WITH MEDIAL MENISECTOMY;  Surgeon: Lamar Millman, MD;  Location: [facility];  Service: Orthopedics;  Laterality: Right;   LEFT HEART CATHETERIZATION WITH CORONARY ANGIOGRAM N/A 09/14/2011   Procedure: LEFT HEART CATHETERIZATION WITH CORONARY ANGIOGRAM;  Surgeon: Vinie KYM Maxcy, MD;  Location: [facility];  Service: Cardiovascular;  Laterality: N/A;   LYMPH NODE DISSECTION Left 03/22/2023   Procedure: LYMPH NODE DISSECTION;  Surgeon: Shyrl Linnie KIDD, MD;  [facility];  Service: Thoracic;  Laterality: Left;   POLYPECTOMY  04/08/2016   Procedure: POLYPECTOMY;  Surgeon: Margo CROME Haddock, MD;  [facility];  Service: Endoscopy;;  colon    POLYPECTOMY  12/01/2022   POLYPECTOMY  12/01/2022   Procedure: POLYPECTOMY;  Surgeon: Cindie Carlin POUR, DO;  [facility];  Service: Endoscopy;;   RETINAL DETACHMENT SURGERY Left 2002   Dr. Carmela    Current Outpatient Medications on File Prior to Visit  Medication Sig Dispense Refill   acetaminophen  (TYLENOL ) 500 MG tablet Take 500-1,000 mg by mouth every 6 (six) hours as needed for mild pain, headache or moderate pain.     ALPHA LIPOIC ACID PO Take 1 capsule by mouth in the morning.     B Complex-C (B-COMPLEX WITH VITAMIN C) tablet Take 1 tablet by mouth in the morning.     benzonatate  (TESSALON ) 100 MG capsule Take 1 capsule (100 mg total) by mouth 3 (three) times  daily as needed for cough. Do not take with alcohol  or while operating or driving heavy machinery 21 capsule 0   buPROPion  (WELLBUTRIN  XL) 300 MG 24 hr tablet Take 300 mg by mouth daily.     cyclobenzaprine (FLEXERIL) 5 MG tablet Take 1 tablet (5 mg total) by mouth at bedtime. For muscle spasms (night time) 30 tablet 1   diclofenac Sodium (VOLTAREN) 1 % GEL Apply 1 Application topically 4 (four) times daily as needed (knee pain).     finasteride  (PROSCAR ) 5 MG tablet TAKE ONE TABLET BY MOUTH ONCE DAILY. 90 tablet 3   fluticasone  (FLONASE ) 50 MCG/ACT nasal spray Place 1 spray into both nostrils daily as needed for allergies. 18.2 mL 11   levothyroxine  (SYNTHROID ) 88 MCG tablet Take 1 tablet (88 mcg total) by mouth daily before breakfast. 30 tablet 2   losartan  (COZAAR ) 25 MG tablet Take 1 tablet (25 mg total) by mouth daily. 90 tablet 1  meclizine  (ANTIVERT ) 25 MG tablet Take 1 tablet (25 mg total) by mouth 3 (three) times daily as needed for dizziness. 30 tablet 0   meloxicam  (MOBIC ) 15 MG tablet Take 1 tablet (15 mg total) by mouth daily. 30 tablet 3   methocarbamol  (ROBAXIN ) 500 MG tablet Take 1 tablet (500 mg total) by mouth every 8 (eight) hours as needed. During day for back spasm 30 tablet 1   pantoprazole  (PROTONIX ) 40 MG tablet TAKE ONE TABLET BY MOUTH ONCE DAILY. 90 tablet 3   Propylene Glycol (SYSTANE COMPLETE) 0.6 % SOLN Place 1 drop into both eyes as needed (dry eyes).     rosuvastatin  (CRESTOR ) 20 MG tablet Take 1 tablet (20 mg total) by mouth daily. 90 tablet 0   silodosin  (RAPAFLO ) 8 MG CAPS capsule Take 1 capsule (8 mg total) by mouth 2 (two) times daily. 60 capsule 11   No current facility-administered medications on file prior to visit.    Allergies  Allergen Reactions   Avelox [Moxifloxacin] Other (See Comments)    Caused tendonitis   Penicillins Other (See Comments)    Chills        Objective:    BP 137/87   Pulse 73   Temp 97.7 F (36.5 C)   Ht 6' 5 (1.956 m)    Wt 211 lb 6.4 oz (95.9 kg)   SpO2 97%   BMI 25.07 kg/m    Physical Exam Vitals and nursing note reviewed.  Constitutional:      Appearance: Normal appearance. He is normal weight.  HENT:     Head: Normocephalic and atraumatic.     Right Ear: Tympanic membrane, ear canal and external ear normal.     Left Ear: Tympanic membrane, ear canal and external ear normal.     Nose: Congestion present.     Right Sinus: No maxillary sinus tenderness or frontal sinus tenderness.     Left Sinus: No maxillary sinus tenderness or frontal sinus tenderness.     Mouth/Throat:     Mouth: Mucous membranes are moist.     Pharynx: Oropharynx is clear.  Eyes:     Extraocular Movements: Extraocular movements intact.     Conjunctiva/sclera: Conjunctivae normal.  Cardiovascular:     Rate and Rhythm: Normal rate and regular rhythm.     Pulses: Normal pulses.     Heart sounds: Normal heart sounds.  Pulmonary:     Effort: Pulmonary effort is normal.     Breath sounds: Normal breath sounds.  Musculoskeletal:     Cervical back: No tenderness.  Lymphadenopathy:     Cervical: No cervical adenopathy.  Skin:    General: Skin is warm and dry.     Capillary Refill: Capillary refill takes less than 2 seconds.  Neurological:     General: No focal deficit present.     Mental Status: He is alert and oriented to person, place, and time. Mental status is at baseline.  Psychiatric:        Mood and Affect: Mood normal.        Behavior: Behavior normal.        Thought Content: Thought content normal.        Judgment: Judgment normal.       No results found for any visits on 07/31/24.     Assessment & Plan:   Problem List Items Addressed This Visit   None Visit Diagnoses       Viral URI with cough    -  Primary   Relevant Orders   SARS-CoV-2 RNA, Influenza A/B, and RSV RNA, Qualitative NAAT       Assessment and Plan Assessment & Plan Acute upper respiratory infection with cough and nasal  congestion Recurrent cough and nasal congestion with mild ear pain and sore throat. Likely viral etiology. No indication for antibiotics. - Performed COVID-19, influenza, and RSV swabs. - Advised Flonase  daily for nasal symptoms. - Recommended decongestant if safe with medical history. - Encouraged hydration. - Instructed to monitor for bacterial infection or pneumonia symptoms.    No orders of the defined types were placed in this encounter.   Return if symptoms worsen or fail to improve.  Jeoffrey GORMAN Barrio, FNP [facility]

## 2024-08-03 LAB — SARS-COV-2 RNA, INFLUENZA A/B, AND RSV RNA, QUALITATIVE NAAT
INFLUENZA A RNA: NOT DETECTED
INFLUENZA B RNA: NOT DETECTED
RSV RNA: NOT DETECTED
SARS COV2 RNA: NOT DETECTED

## 2024-08-13 ENCOUNTER — Other Ambulatory Visit (HOSPITAL_COMMUNITY): Payer: Self-pay | Admitting: Orthopedic Surgery

## 2024-08-13 DIAGNOSIS — M25562 Pain in left knee: Secondary | ICD-10-CM

## 2024-08-19 ENCOUNTER — Telehealth: Payer: Self-pay

## 2024-08-19 NOTE — Telephone Encounter (Signed)
 Pt called stating he his unable to ejaculate and wanted to know if he needed to be seen sooner ? Pt advised per verbal from MD McKenzie  that is a side effect of his silodosin  and is normal. Pt made aware and voiced his understanding

## 2024-08-21 ENCOUNTER — Ambulatory Visit (HOSPITAL_COMMUNITY): Admission: RE | Admit: 2024-08-21 | Discharge: 2024-08-21 | Attending: Orthopedic Surgery

## 2024-08-21 DIAGNOSIS — M25562 Pain in left knee: Secondary | ICD-10-CM | POA: Diagnosis present

## 2024-08-27 ENCOUNTER — Encounter: Payer: Self-pay | Admitting: Gastroenterology

## 2024-08-27 ENCOUNTER — Encounter: Payer: Self-pay | Admitting: Family Medicine

## 2024-08-27 ENCOUNTER — Ambulatory Visit: Admitting: Family Medicine

## 2024-08-27 ENCOUNTER — Ambulatory Visit: Payer: Self-pay

## 2024-08-27 VITALS — BP 160/80 | HR 80 | Ht 77.0 in | Wt 216.2 lb

## 2024-08-27 DIAGNOSIS — E039 Hypothyroidism, unspecified: Secondary | ICD-10-CM

## 2024-08-27 DIAGNOSIS — Z23 Encounter for immunization: Secondary | ICD-10-CM

## 2024-08-27 DIAGNOSIS — G454 Transient global amnesia: Secondary | ICD-10-CM

## 2024-08-27 DIAGNOSIS — R413 Other amnesia: Secondary | ICD-10-CM

## 2024-08-27 DIAGNOSIS — F32A Depression, unspecified: Secondary | ICD-10-CM

## 2024-08-27 DIAGNOSIS — I10 Essential (primary) hypertension: Secondary | ICD-10-CM

## 2024-08-27 DIAGNOSIS — F419 Anxiety disorder, unspecified: Secondary | ICD-10-CM | POA: Diagnosis not present

## 2024-08-27 MED ORDER — SERTRALINE HCL 25 MG PO TABS: 25.0000 mg | ORAL_TABLET | Freq: Every day | ORAL | 1 refills | Status: AC

## 2024-08-27 MED ORDER — LOSARTAN POTASSIUM 50 MG PO TABS: 50.0000 mg | ORAL_TABLET | Freq: Every day | ORAL | 3 refills | Status: AC

## 2024-08-27 NOTE — Progress Notes (Unsigned)
 Patient Office Visit  Assessment & Plan:  Essential hypertension -     Losartan  Potassium; Take 1 tablet (50 mg total) by mouth daily.  Dispense: 90 tablet; Refill: 3 -     Comprehensive metabolic panel with GFR  Need for pneumococcal 20-valent conjugate vaccination -     Pneumococcal conjugate vaccine 20-valent  Transient global amnesia -     Ambulatory referral to Neurology -     Vitamin B12  Memory changes -     Ambulatory referral to Neurology -     Vitamin B12  Anxiety and depression -     Sertraline  HCl; Take 1 tablet (25 mg total) by mouth daily.  Dispense: 90 tablet; Refill: 1  Hypothyroidism, unspecified type -     TSH   Assessment and Plan    Essential hypertension Blood pressure remains elevated on current losartan  25 mg. - Increased losartan  to 50 mg daily. Instructed to double up on current 25 mg tablets until new prescription is filled. - Encouraged low-salt diet and adequate hydration.  Left knee medial meniscus tear with Baker's cyst MRI confirmed medial meniscus tear and Baker's cyst. Pain and swelling persist with activity. - Continue meloxicam  as needed for pain management.  Vertigo and balance disturbance Recent vertigo episode with nausea and vomiting. Persistent balance issues. Previous CT showed small vessel disease. - Referred to neurology for further evaluation of vertigo and balance issues.  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 Previous MRI showed no acute abnormalities. - Referred to neurology for evaluation of memory concerns.  Depression and anxiety Reports lack of motivation and possible depression. Currently on Wellbutrin . - Prescribed Zoloft  to be taken with Wellbutrin . - Continue counseling sessions with psychiatrist in [place].  General health maintenance Pneumococcal vaccination was due. - Administered pneumococcal 20 vaccine.     Test results  were reviewed and analyzed as part of the medical decision making of this visit.  Reviewed previous notes from last year and previous imaging CT scan and brain MRI. Neurology consult ordered, labs ordered. Will add Zoloft  25mg  once per day. Continue Wellbutrin  XL 150mg  every AM. RTC 4-6 weeks.  Return in about 5 weeks (around 10/01/2024), or if symptoms worsen or fail to improve, for depression.   Subjective:    Patient ID: Micheal Baker, male    DOB: July 26, 1946  Age: 78 y.o. MRN: [id number]  Chief Complaint  Patient presents with   Hypertension    Pt states that he has been having high blood pressure readings in the 160's/90's.     Hypertension   Discussed the use of AI scribe software for clinical note transcription with the patient, who gave verbal consent to proceed.  History of Present Illness       History of Present Illness Micheal Baker is a 78 year old male with hypertension who presents with elevated blood pressure and vertigo and concerns re memory  He has noticed elevated blood pressure readings at home, with a recent measurement of 165/90 mmHg, compared to 160/80 mmHg at the clinic. He is currently taking losartan  25 mg daily, which he started at the clinic.  He experienced an episode of vertigo the night before the visit, lasting a few minutes and accompanied by nausea and vomiting. The sensation was similar to vertigo he experienced several years ago. Although the vertigo has resolved, he continues to have balance issues, feeling as though he might fall backwards.  No current vertigo, but he reports balance issues and occasional dizziness.  He has a history of a left knee medial meniscus tear and Baker's cyst, diagnosed via MRI after consultation. He experiences pain and swelling in the knee, affecting his ability to dance, which he enjoys as exercise. He occasionally takes meloxicam  for pain relief.  He reports an episode of memory loss following a biopsy in May 2024, during  which he was unable to recall events of the day. He has had multiple episodes of dizziness and vertigo and reports ongoing memory issues, struggling to recall recent events.  He is currently taking Wellbutrin  for depression and has an appointment with a counselor scheduled. He experiences days of low motivation, which he attributes to depression. He previously took Lexapro  but discontinued it after feeling stable.  Physical Exam VITALS: BP- 160/80 CHEST: Lungs clear to auscultation bilaterally. NEUROLOGICAL: Motor strength 5/5 in all extremities.  Results Labs Kidney function panel (05/2024): Within normal limits  Radiology Left knee MRI (08/21/2024): Medial meniscus tear, radial tear of posterior horn, Baker's cyst, joint effusion Brain MRI (02/2024): Chronic small vessel disease, no acute abnormality Brain CT (02/14/2024): No acute abnormality Brain MRI (02/08/2023): Normal  Assessment and Plan Essential hypertension Blood pressure remains elevated on current losartan  25 mg. - Increased losartan  to 50 mg daily. Instructed to double up on current 25 mg tablets until new prescription is filled. - Encouraged low-salt diet and adequate hydration.  Left knee medial meniscus tear with Baker's cyst MRI confirmed medial meniscus tear and Baker's cyst. Pain and swelling persist with activity. - Continue meloxicam  as needed for pain management.  Vertigo and balance disturbance Recent vertigo episode with nausea and vomiting. Persistent balance issues. Previous CT showed small vessel disease. - Referred to neurology for further evaluation of vertigo and balance issues.  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 Previous MRI showed no acute abnormalities. - Referred to neurology for evaluation of memory concerns.  Depression and anxiety Reports lack of motivation and possible depression. Currently on  Wellbutrin . - Prescribed Zoloft  to be taken with Wellbutrin . - Continue counseling sessions with psychiatrist in [place].  General health maintenance Pneumococcal vaccination was due. - Administered pneumococcal 20 vaccine.    The 10-year ASCVD risk score (Arnett DK, et al., 2019) is: 43.6%  Past Medical History:  Diagnosis Date   Acute medial meniscus tear of left knee    Depression    Frequent PVCs    GERD (gastroesophageal reflux disease)    History of hiatal hernia    History of inguinal hernia    History of kidney stones    Hypertension    Hypothyroidism    Lung cancer (HCC)    NSVT (nonsustained ventricular tachycardia) (HCC)    Osteoarthritis    Sleep apnea    Thyroid  disease    Past Surgical History:  Procedure Laterality Date   BIOPSY  12/01/2022   Procedure: BIOPSY;  Surgeon: Cindie Carlin POUR, DO;  [facility];  Service: Endoscopy;;   BRONCHIAL BIOPSY  02/07/2023   Procedure: BRONCHIAL BIOPSIES;  Surgeon: Brenna Adine CROME, DO;  [facility];  Service: Pulmonary;;   BRONCHIAL NEEDLE ASPIRATION BIOPSY  02/07/2023   Procedure: BRONCHIAL NEEDLE ASPIRATION BIOPSIES;  Surgeon: Brenna Adine CROME, DO;  [facility];  Service: Pulmonary;;   CATARACT EXTRACTION     left   COLONOSCOPY N/A 04/08/2016   Surgeon: Margo CROME Haddock, MD; one  6 mm polyp in the cecum removed, one 4 mm polyp in the descending colon removed, nonbleeding internal hemorrhoids.  Pathology with hyperplastic polyps.  Recommended colonoscopy in 10 years.   COLONOSCOPY  12/01/2022   COLONOSCOPY WITH PROPOFOL  N/A 12/01/2022   Procedure: COLONOSCOPY WITH PROPOFOL ;  Surgeon: Cindie Carlin POUR, DO;  [facility];  Service: Endoscopy;  Laterality: N/A;  8:15 am, asa 3   endosocopy  12/01/2022   ESOPHAGOGASTRODUODENOSCOPY (EGD) WITH PROPOFOL  N/A 12/01/2022   Procedure: ESOPHAGOGASTRODUODENOSCOPY (EGD) WITH PROPOFOL ;  Surgeon: Cindie Carlin POUR, DO;  [facility];   Service: Endoscopy;  Laterality: N/A;   EYE SURGERY     left-scar tissue   FIDUCIAL MARKER PLACEMENT  02/07/2023   Procedure: FIDUCIAL MARKER PLACEMENT;  Surgeon: Brenna Adine CROME, DO;  [facility];  Service: Pulmonary;;   groin surgery Right 2010   growth removed    KNEE ARTHROSCOPY     left   KNEE ARTHROSCOPY WITH LATERAL MENISECTOMY Right 03/11/2015   Procedure: RIGHT KNEE ARTHROSCOPY WITH MEDIALAND LATERAL MENISECTOMY;  Surgeon: Lamar Millman, MD;  Location: [facility];  Service: Orthopedics;  Laterality: Right;   KNEE ARTHROSCOPY WITH MEDIAL MENISECTOMY Left 06/27/2014   Procedure: LEFT KNEE ARTHROSCOPY WITH PARTIAL MEDIAL AND LATERAL MENISECTOMIES AND CHONDROPLASTY;  Surgeon: Lamar DELENA Millman, MD;  Location: [facility];  Service: Orthopedics;  Laterality: Left;   KNEE ARTHROSCOPY WITH MEDIAL MENISECTOMY Right 03/11/2015   Procedure: KNEE ARTHROSCOPY WITH MEDIAL MENISECTOMY;  Surgeon: Lamar Millman, MD;  Location: [facility];  Service: Orthopedics;  Laterality: Right;   LEFT HEART CATHETERIZATION WITH CORONARY ANGIOGRAM N/A 09/14/2011   Procedure: LEFT HEART CATHETERIZATION WITH CORONARY ANGIOGRAM;  Surgeon: Vinie KYM Maxcy, MD;  Location: [facility];  Service: Cardiovascular;  Laterality: N/A;   LYMPH NODE DISSECTION Left 03/22/2023   Procedure: LYMPH NODE DISSECTION;  Surgeon: Shyrl Linnie KIDD, MD;  [facility];  Service: Thoracic;  Laterality: Left;   POLYPECTOMY  04/08/2016   Procedure: POLYPECTOMY;  Surgeon: Margo CROME Haddock, MD;  [facility];  Service: Endoscopy;;  colon    POLYPECTOMY  12/01/2022   POLYPECTOMY  12/01/2022   Procedure: POLYPECTOMY;  Surgeon: Cindie Carlin POUR, DO;  [facility];  Service: Endoscopy;;   RETINAL DETACHMENT SURGERY Left 2002   Dr. Carmela   Social History[1] Family History  Problem Relation Age of Onset   Hypertension Mother    Hypertension Father    Heart  attack Father    Diabetes Father    Asthma Daughter    Colon cancer Neg Hx    Stomach cancer Neg Hx    Allergies[2]  ROS    Objective:    BP (!) 160/80 Comment: manual  Pulse 80   Ht 6' 5 (1.956 m)   Wt 216 lb 4 oz (98.1 kg)   SpO2 99%   BMI 25.64 kg/m  BP Readings from Last 3 Encounters:  08/27/24 (!) 160/80  07/31/24 137/87  07/19/24 (!) 145/80   Wt Readings from Last 3 Encounters:  08/27/24 216 lb 4 oz (98.1 kg)  07/31/24 211 lb 6.4 oz (95.9 kg)  06/18/24 212 lb 8 oz (96.4 kg)    Physical Exam Vitals and nursing note reviewed.  Constitutional:      Appearance: Normal appearance.  HENT:     Head: Normocephalic.     Right Ear: Tympanic membrane, ear canal and external ear normal.     Left  Ear: Tympanic membrane, ear canal and external ear normal.  Eyes:     Extraocular Movements: Extraocular movements intact.     Pupils: Pupils are equal, round, and reactive to light.  Cardiovascular:     Rate and Rhythm: Normal rate and regular rhythm.     Heart sounds: Normal heart sounds.  Pulmonary:     Effort: Pulmonary effort is normal.     Breath sounds: Normal breath sounds.  Musculoskeletal:     Right lower leg: No edema.     Left lower leg: No edema.  Neurological:     General: No focal deficit present.     Mental Status: He is alert and oriented to person, place, and time. Mental status is at baseline.     Cranial Nerves: Cranial nerves 2-12 are intact.     Motor: Motor function is intact. No weakness.     Coordination: Coordination is intact. Romberg sign negative. Coordination normal. Finger-Nose-Finger Test normal.     Gait: Gait is intact. Gait and tandem walk normal.  Psychiatric:        Mood and Affect: Mood normal.        Behavior: Behavior normal.        Thought Content: Thought content normal.        Judgment: Judgment normal.      Results for orders placed or performed in visit on 08/27/24  Comprehensive metabolic panel with GFR  Result Value  Ref Range   Glucose, Bld 96 65 - 99 mg/dL   BUN 16 7 - 25 mg/dL   Creat 9.15 9.29 - 8.71 mg/dL   eGFR 90 > OR = 60 fO/fpw/8.26f7   BUN/Creatinine Ratio SEE NOTE: 6 - 22 (calc)   Sodium 141 135 - 146 mmol/L   Potassium 4.0 3.5 - 5.3 mmol/L   Chloride 105 98 - 110 mmol/L   CO2 27 20 - 32 mmol/L   Calcium  9.2 8.6 - 10.3 mg/dL   Total Protein 6.8 6.1 - 8.1 g/dL   Albumin  4.4 3.6 - 5.1 g/dL   Globulin 2.4 1.9 - 3.7 g/dL (calc)   AG Ratio 1.8 1.0 - 2.5 (calc)   Total Bilirubin 0.6 0.2 - 1.2 mg/dL   Alkaline phosphatase (APISO) 67 35 - 144 U/L   AST 35 10 - 35 U/L   ALT 36 9 - 46 U/L  Vitamin B12  Result Value Ref Range   Vitamin B-12 428 200 - 1,100 pg/mL  TSH  Result Value Ref Range   TSH 1.83 0.40 - 4.50 mIU/L            [1]  Social History Tobacco Use   Smoking status: Never    Passive exposure: Never   Smokeless tobacco: Never   Tobacco comments:    06/10/14 1968- Tried it once and didn't like it- AJ  Vaping Use   Vaping status: Never Used  Substance Use Topics   Alcohol  use: Yes    Alcohol /week: 0.0 standard drinks of alcohol     Comment: occasionally   Drug use: No  [2]  Allergies Allergen Reactions   Avelox [Moxifloxacin] Other (See Comments)    Caused tendonitis   Penicillins Other (See Comments)    Chills

## 2024-08-27 NOTE — Telephone Encounter (Signed)
 FYI Only or Action Required?: FYI only for provider: appointment scheduled on 08/27/24.  Patient was last seen in primary care on 07/31/2024 by Kayla Jeoffrey RAMAN, FNP.  Called Nurse Triage reporting Hypertension, Dizziness, and Nausea.  Symptoms began several weeks ago.  Interventions attempted: Prescription medications: as prescribed.  Symptoms are: unchanged.  Triage Disposition: See PCP When Office is Open (Within 3 Days)  Patient/caregiver understands and will follow disposition?: Yes    Copied from CRM #[id number]. Topic: Clinical - Red Word Triage >> Aug 27, 2024  9:14 AM Micheal Baker wrote: [facility] that prompted transfer to Nurse Triage: dizziness, nausea, blood pressure 150/90 Reason for Disposition  [1] Taking BP medications AND [2] feels is having side effects (e.g., impotence, cough, dizzy upon standing)  Answer Assessment - Initial Assessment Questions 1. BLOOD PRESSURE: What is your blood pressure? Did you take at least two measurements 5 minutes apart?     Average 150/90 for month of Dec.    Few days ago161/85 2. ONSET: When did you take your blood pressure?     Entire month  3. HOW: How did you take your blood pressure? (e.g., automatic home BP monitor, visiting nurse)      4. HISTORY: Do you have a history of high blood pressure?      5. MEDICINES: Are you taking any medicines for blood pressure? Have you missed any doses recently?     Taking as prescribed  6. OTHER SYMPTOMS: Do you have any symptoms? (e.g., blurred vision, chest pain, difficulty breathing, headache, weakness)     Dizzy-vertigo last evening felt like he would pass out, resolved.  7. PREGNANCY: Is there any chance you are pregnant? When was your last menstrual period?  Protocols used: Blood Pressure - High-A-AH

## 2024-08-28 ENCOUNTER — Ambulatory Visit: Payer: Self-pay | Admitting: Family Medicine

## 2024-08-28 LAB — COMPREHENSIVE METABOLIC PANEL WITH GFR
AG Ratio: 1.8 (calc) (ref 1.0–2.5)
ALT: 36 U/L (ref 9–46)
AST: 35 U/L (ref 10–35)
Albumin: 4.4 g/dL (ref 3.6–5.1)
Alkaline phosphatase (APISO): 67 U/L (ref 35–144)
BUN: 16 mg/dL (ref 7–25)
CO2: 27 mmol/L (ref 20–32)
Calcium: 9.2 mg/dL (ref 8.6–10.3)
Chloride: 105 mmol/L (ref 98–110)
Creat: 0.84 mg/dL (ref 0.70–1.28)
Globulin: 2.4 g/dL (ref 1.9–3.7)
Glucose, Bld: 96 mg/dL (ref 65–99)
Potassium: 4 mmol/L (ref 3.5–5.3)
Sodium: 141 mmol/L (ref 135–146)
Total Bilirubin: 0.6 mg/dL (ref 0.2–1.2)
Total Protein: 6.8 g/dL (ref 6.1–8.1)
eGFR: 90 mL/min/1.73m2 (ref >=60)

## 2024-08-28 LAB — VITAMIN B12: Vitamin B-12: 428 pg/mL (ref 200–1100)

## 2024-08-28 LAB — TSH: TSH: 1.83 m[IU]/L (ref 0.40–4.50)

## 2024-09-06 ENCOUNTER — Encounter: Payer: Self-pay | Admitting: Family Medicine

## 2024-09-06 ENCOUNTER — Ambulatory Visit: Admitting: Family Medicine

## 2024-09-06 VITALS — BP 144/82 | HR 85 | Temp 97.6°F | Ht 77.0 in | Wt 218.4 lb

## 2024-09-06 DIAGNOSIS — R5383 Other fatigue: Secondary | ICD-10-CM

## 2024-09-06 DIAGNOSIS — F32A Depression, unspecified: Secondary | ICD-10-CM

## 2024-09-06 DIAGNOSIS — R2689 Other abnormalities of gait and mobility: Secondary | ICD-10-CM

## 2024-09-06 DIAGNOSIS — R0789 Other chest pain: Secondary | ICD-10-CM | POA: Diagnosis not present

## 2024-09-06 DIAGNOSIS — F419 Anxiety disorder, unspecified: Secondary | ICD-10-CM | POA: Diagnosis not present

## 2024-09-06 DIAGNOSIS — I493 Ventricular premature depolarization: Secondary | ICD-10-CM | POA: Diagnosis not present

## 2024-09-06 NOTE — Progress Notes (Signed)
 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 Neurology referral made. - Continue follow up with neurology.  Fatigue Persistent fatigue with possible multifactorial causes including low B12, depression, iron deficiency, and sleep disturbances. - Ordered iron panel. - Encouraged 7 hours of sleep per night.  Atypical chest pain Intermittent post-exercise chest discomfort with normal cardiology evaluation. EKG shows extra beats PVCs, no acute findings  Premature ventricular contractions EKG shows PVCs consistent with previous findings. Unable to tolerate beta blockers. PVCs may contribute to fatigue. - Monitor PVCs and symptoms.  Depression and anxiety On Zoloft  and Wellbutrin  with no adverse effects. Symptoms may contribute to fatigue. - Continue Zoloft  and Wellbutrin .     If worsening symptoms over the weekend needs to go to ED for further evaluation. May need to see cardiology again though has no follow up appt.   Return if symptoms worsen or fail to improve.   Subjective:    Patient ID: Micheal Baker, male    DOB: [birth year]  Age: 78 y.o. MRN: [id number]  Chief Complaint  Patient presents with   Acute Visit    No energy, balance issues x 4 days. Discomfort in left chest of chest past 1.5 hours when breathing.     HPI Discussed the use of AI scribe software for clinical note transcription with the patient, who gave verbal consent to proceed.  History of Present Illness        History of Present Illness Micheal Baker is a 78 year old male with a history of transient global amnesia who presents with worsening balance issues and fatigue. patient just does not feel right   He describes  worsening balance issues and fatigue, feeling 'not right' and lacking energy, which has impacted his ability to exercise. He attempted to use the elliptical machine but could only manage 12 minutes before feeling too fatigued to continue. He feels wobbly and off balance, though he was able to drive to the appointment. he has not had any falls. Neurology consult has been ordered  He experiences tingling in his hands and some stiffness and soreness in his neck. No fever, chills, or flu-like symptoms, although he has been coughing. He has been around his grandchildren, who were not sick.  He reports some discomfort on the left side of his chest, which started after exercising and is present at the time of the visit. The discomfort is not severe and does not feel like pressure or an 'elephant sitting on his chest.' It is slightly bothersome when taking a deep breath.  He has a history of PVCs and has experienced extra heartbeats, which he sometimes notices by checking his pulse. He previously could not tolerate beta blockers due to low heart rate and dizziness. He is currently taking losartan  50 mg and has started Zoloft  and Wellbutrin  about nine days ago for depression.  He reports sleeping about five to six hours per night, which is less than his usual amount. He sometimes wakes up feeling fine but generally feels tired and unable to perform activities as he could a few weeks ago.  His recent blood work showed a slightly low B12 level, and other parameters  including kidney, liver, and thyroid  function were reported to him as normal  Physical Exam CHEST: Lungs clear to auscultation bilaterally. NEUROLOGICAL: Difficulty with tandem gait.  Results Labs Vitamin B12 (08/27/2024): Low end of normal  CMP (08/27/2024): Within normal limits for renal and hepatic function, glucose within normal limits Thyroid  function (08/27/2024): Within normal limits  Diagnostic Echocardiogram (07/2024): Normal, no  significant aortic abnormality Ambulatory cardiac monitor (2024): Frequent premature ventricular contractions Cardiac stress test: Abnormal but has seen cardiology since then  Electrocardiogram (EKG) Sinus rhythm with premature ventricular contractions. No acute ischemic changes.  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 Neurology referral made. - Continue follow up with neurology.  Fatigue Persistent fatigue with possible multifactorial causes including low B12, depression, iron deficiency, and sleep disturbances. - Ordered iron panel. - Encouraged 7 hours of sleep per night.  Atypical chest pain Intermittent post-exercise chest discomfort with normal cardiology evaluation. EKG shows extra beats PVCs, no acute findings  Premature ventricular contractions EKG shows PVCs consistent with previous findings. Unable to tolerate beta blockers. PVCs may contribute to fatigue. - Monitor PVCs and symptoms.  Depression and anxiety On Zoloft  and Wellbutrin  with no adverse effects. Symptoms may contribute to fatigue. - Continue Zoloft  and Wellbutrin .    The 10-year ASCVD risk score (Arnett DK, et al., 2019) is: 37.8%  Past Medical History:  Diagnosis Date   Acute medial meniscus tear of left knee    Depression    Frequent PVCs    GERD (gastroesophageal reflux disease)    History of hiatal hernia    History of inguinal hernia    History of kidney stones    Hypertension    Hypothyroidism    Lung cancer (HCC)    NSVT (nonsustained ventricular tachycardia) (HCC)    Osteoarthritis    Sleep apnea    Thyroid  disease    Past Surgical History:  Procedure Laterality Date   BIOPSY  12/01/2022   Procedure: BIOPSY;  Surgeon: Cindie Carlin POUR, DO;  [facility];  Service: Endoscopy;;   BRONCHIAL BIOPSY  02/07/2023   Procedure: BRONCHIAL BIOPSIES;  Surgeon: Brenna Adine CROME, DO;  Location: [facility];  Service:  Pulmonary;;   BRONCHIAL NEEDLE ASPIRATION BIOPSY  02/07/2023   Procedure: BRONCHIAL NEEDLE ASPIRATION BIOPSIES;  Surgeon: Brenna Adine CROME, DO;  Location: [facility];  Service: Pulmonary;;   CATARACT EXTRACTION     left   COLONOSCOPY N/A 04/08/2016   Surgeon: Margo CROME Haddock, MD; one 6 mm polyp in the cecum removed, one 4 mm polyp in the descending colon removed, nonbleeding internal hemorrhoids.  Pathology with hyperplastic polyps.  Recommended colonoscopy in 10 years.   COLONOSCOPY  12/01/2022   COLONOSCOPY WITH PROPOFOL  N/A 12/01/2022   Procedure: COLONOSCOPY WITH PROPOFOL ;  Surgeon: Cindie Carlin POUR, DO;  [facility];  Service: Endoscopy;  Laterality: N/A;  8:15 am, asa 3   endosocopy  12/01/2022   ESOPHAGOGASTRODUODENOSCOPY (EGD) WITH PROPOFOL  N/A 12/01/2022   Procedure: ESOPHAGOGASTRODUODENOSCOPY (EGD) WITH PROPOFOL ;  Surgeon: Cindie Carlin POUR, DO;  [facility];  Service: Endoscopy;  Laterality: N/A;   EYE SURGERY     left-scar tissue   FIDUCIAL MARKER PLACEMENT  02/07/2023   Procedure: FIDUCIAL MARKER PLACEMENT;  Surgeon: Brenna Adine CROME, DO;  Location: [facility];  Service: Pulmonary;;   groin surgery Right 2010   growth removed    KNEE ARTHROSCOPY     left   KNEE ARTHROSCOPY WITH  LATERAL MENISECTOMY Right 03/11/2015   Procedure: RIGHT KNEE ARTHROSCOPY WITH MEDIALAND LATERAL MENISECTOMY;  Surgeon: Lamar Millman, MD;  Location: [facility];  Service: Orthopedics;  Laterality: Right;   KNEE ARTHROSCOPY WITH MEDIAL MENISECTOMY Left 06/27/2014   Procedure: LEFT KNEE ARTHROSCOPY WITH PARTIAL MEDIAL AND LATERAL MENISECTOMIES AND CHONDROPLASTY;  Surgeon: Lamar DELENA Millman, MD;  Location: [facility];  Service: Orthopedics;  Laterality: Left;   KNEE ARTHROSCOPY WITH MEDIAL MENISECTOMY Right 03/11/2015   Procedure: KNEE ARTHROSCOPY WITH MEDIAL MENISECTOMY;  Surgeon: Lamar Millman, MD;  Location:  SURGERY CENTER;  Service:  Orthopedics;  Laterality: Right;   LEFT HEART CATHETERIZATION WITH CORONARY ANGIOGRAM N/A 09/14/2011   Procedure: LEFT HEART CATHETERIZATION WITH CORONARY ANGIOGRAM;  Surgeon: Vinie KYM Maxcy, MD;  [facility];  Service: Cardiovascular;  Laterality: N/A;   LYMPH NODE DISSECTION Left 03/22/2023   Procedure: LYMPH NODE DISSECTION;  Surgeon: Shyrl Linnie KIDD, MD;  Location: [facility];  Service: Thoracic;  Laterality: Left;   POLYPECTOMY  04/08/2016   Procedure: POLYPECTOMY;  Surgeon: Margo LITTIE Haddock, MD;  [facility];  Service: Endoscopy;;  colon    POLYPECTOMY  12/01/2022   POLYPECTOMY  12/01/2022   Procedure: POLYPECTOMY;  Surgeon: Cindie Carlin POUR, DO;  [facility];  Service: Endoscopy;;   RETINAL DETACHMENT SURGERY Left 2002   Dr. Carmela   Social History[1] Family History  Problem Relation Age of Onset   Hypertension Mother    Hypertension Father    Heart attack Father    Diabetes Father    Asthma Daughter    Colon cancer Neg Hx    Stomach cancer Neg Hx    Allergies[2]  ROS    Objective:    BP (!) 144/82   Pulse 85   Temp 97.6 F (36.4 C)   Ht 6' 5 (1.956 m)   Wt 218 lb 6.4 oz (99.1 kg)   SpO2 99%   BMI 25.90 kg/m  BP Readings from Last 3 Encounters:  09/06/24 (!) 144/82  08/27/24 (!) 160/80  07/31/24 137/87   Wt Readings from Last 3 Encounters:  09/06/24 218 lb 6.4 oz (99.1 kg)  08/27/24 216 lb 4 oz (98.1 kg)  07/31/24 211 lb 6.4 oz (95.9 kg)    Physical Exam Vitals and nursing note reviewed.  Constitutional:      General: He is not in acute distress.    Appearance: Normal appearance.  HENT:     Head: Normocephalic.     Right Ear: Tympanic membrane, ear canal and external ear normal.     Left Ear: Tympanic membrane, ear canal and external ear normal.  Eyes:     Extraocular Movements: Extraocular movements intact.     Conjunctiva/sclera: Conjunctivae normal.     Pupils: Pupils are equal, round, and reactive to light.   Cardiovascular:     Rate and Rhythm: Normal rate and regular rhythm.     Heart sounds: Normal heart sounds.     Comments: No tenderness over chest area to palpation Pulmonary:     Effort: Pulmonary effort is normal.     Breath sounds: Normal breath sounds. No wheezing or rhonchi.  Abdominal:     Tenderness: There is no abdominal tenderness.  Musculoskeletal:     Right lower leg: No edema.     Left lower leg: No edema.  Neurological:     General: No focal deficit present.     Mental Status: He is alert and oriented  to person, place, and time.     Gait: Gait abnormal.     Comments: Patient has difficulty with tandem gait (unchanged compared to previous visit)  Psychiatric:        Mood and Affect: Mood normal.        Behavior: Behavior normal.        Thought Content: Thought content normal.        Judgment: Judgment normal.      No results found for any visits on 09/06/24.          [1]  Social History Tobacco Use   Smoking status: Never    Passive exposure: Never   Smokeless tobacco: Never   Tobacco comments:    06/10/14 1968- Tried it once and didn't like it- AJ  Vaping Use   Vaping status: Never Used  Substance Use Topics   Alcohol  use: Yes    Alcohol /week: 0.0 standard drinks of alcohol     Comment: occasionally   Drug use: No  [2]  Allergies Allergen Reactions   Avelox [Moxifloxacin] Other (See Comments)    Caused tendonitis   Penicillins Other (See Comments)    Chills    "

## 2024-09-07 LAB — IRON,TIBC AND FERRITIN PANEL
%SAT: 27 % (ref 20–48)
Ferritin: 52 ng/mL (ref 24–380)
Iron: 92 ug/dL (ref 50–180)
TIBC: 335 ug/dL (ref 250–425)

## 2024-09-08 ENCOUNTER — Ambulatory Visit: Payer: Self-pay | Admitting: Family Medicine

## 2024-09-09 ENCOUNTER — Ambulatory Visit (INDEPENDENT_AMBULATORY_CARE_PROVIDER_SITE_OTHER)

## 2024-09-09 ENCOUNTER — Telehealth (HOSPITAL_BASED_OUTPATIENT_CLINIC_OR_DEPARTMENT_OTHER): Payer: Self-pay | Admitting: *Deleted

## 2024-09-09 ENCOUNTER — Ambulatory Visit (HOSPITAL_COMMUNITY)

## 2024-09-09 ENCOUNTER — Telehealth: Payer: Self-pay

## 2024-09-09 DIAGNOSIS — F321 Major depressive disorder, single episode, moderate: Secondary | ICD-10-CM

## 2024-09-09 NOTE — Telephone Encounter (Signed)
 Pt returning call to schedule Tele Visit. Please advise

## 2024-09-09 NOTE — Telephone Encounter (Signed)
" ° °  Name: Micheal Baker  DOB: [birth year]  MRN: [id number]  Primary Cardiologist: Diannah SHAUNNA Maywood, MD   Preoperative team, please contact this patient and set up a phone call appointment for further preoperative risk assessment. Please obtain consent and complete medication review. Thank you for your help.  I confirm that guidance regarding antiplatelet and oral anticoagulation therapy has been completed and, if necessary, noted below.  I also confirmed the patient resides in the state of [place] . As per [facility] Medical Board telemedicine laws, the patient must reside in the state in which the provider is licensed.   Lamarr Satterfield, NP 09/09/2024, 3:37 PM [facility]    "

## 2024-09-09 NOTE — Telephone Encounter (Signed)
 Preop Tele appt now scheduled Med req and consent is completed

## 2024-09-09 NOTE — Telephone Encounter (Signed)
 Left message to call back to schedule a tele preop appt,

## 2024-09-09 NOTE — Telephone Encounter (Signed)
"  °  Patient Consent for Virtual Visit        Micheal Baker has provided verbal consent on 09/09/2024 for a virtual visit (video or telephone).   CONSENT FOR VIRTUAL VISIT FOR:  Micheal Baker  By participating in this virtual visit I agree to the following:  I hereby voluntarily request, consent and authorize [facility] and its employed or contracted physicians, physician assistants, nurse practitioners or other licensed health care professionals (the Practitioner), to provide me with telemedicine health care services (the Services) as deemed necessary by the treating Practitioner. I acknowledge and consent to receive the Services by the Practitioner via telemedicine. I understand that the telemedicine visit will involve communicating with the Practitioner through live audiovisual communication technology and the disclosure of certain medical information by electronic transmission. I acknowledge that I have been given the opportunity to request an in-person assessment or other available alternative prior to the telemedicine visit and am voluntarily participating in the telemedicine visit.  I understand that I have the right to withhold or withdraw my consent to the use of telemedicine in the course of my care at any time, without affecting my right to future care or treatment, and that the Practitioner or I may terminate the telemedicine visit at any time. I understand that I have the right to inspect all information obtained and/or recorded in the course of the telemedicine visit and may receive copies of available information for a reasonable fee.  I understand that some of the potential risks of receiving the Services via telemedicine include:  Delay or interruption in medical evaluation due to technological equipment failure or disruption; Information transmitted may not be sufficient (e.g. poor resolution of images) to allow for appropriate medical decision making by the Practitioner;  and/or  In rare instances, security protocols could fail, causing a breach of personal health information.  Furthermore, I acknowledge that it is my responsibility to provide information about my medical history, conditions and care that is complete and accurate to the best of my ability. I acknowledge that Practitioner's advice, recommendations, and/or decision may be based on factors not within their control, such as incomplete or inaccurate data provided by me or distortions of diagnostic images or specimens that may result from electronic transmissions. I understand that the practice of medicine is not an exact science and that Practitioner makes no warranties or guarantees regarding treatment outcomes. I acknowledge that a copy of this consent can be made available to me via my patient portal [facility] MyChart), or I can request a printed copy by calling the office of [facility].    I understand that my insurance will be billed for this visit.   I have read or had this consent read to me. I understand the contents of this consent, which adequately explains the benefits and risks of the Services being provided via telemedicine.  I have been provided ample opportunity to ask questions regarding this consent and the Services and have had my questions answered to my satisfaction. I give my informed consent for the services to be provided through the use of telemedicine in my medical care    "

## 2024-09-09 NOTE — Telephone Encounter (Signed)
"  ° °  Pre-operative Risk Assessment    Patient Name: Micheal Baker  DOB: Jan 26, 1946 MRN: [id number]   Date of last office visit: 06/17/24 DR. MALLIPEDDI Date of next office visit: NONE   Request for Surgical Clearance    Procedure:  LEFT TOTAL KNEE ARTHROPLASTY  Date of Surgery:  Clearance TBD                                Surgeon:  DR. EVALENE CHANCY Surgeon's Group or Practice Name:  [facility] Phone number:  [phone] Fax number:  [phone] ATTN: KELLY HIGH   Type of Clearance Requested:   - Medical    Type of Anesthesia:  Spinal   Additional requests/questions:    Bonney Niels Jest   09/09/2024, 3:23 PM   "

## 2024-09-09 NOTE — Progress Notes (Signed)
 Comprehensive Clinical Assessment (CCA) Note  09/09/2024 Micheal Baker [id number]  Chief Complaint:  Chief Complaint  Patient presents with   Establish Care    Feels that depression had been going on 3-4 weeks.   Visit Diagnosis: F32.1 Major Depressive Disorder, Single Episode, moderate      CCA Biopsychosocial Intake/Chief Complaint:  i feel depressed  Current Symptoms/Problems: ruminations about past relationships, feeling aline, not sleeping well, feeling down,  not enjoying activities he used to enjoy, not socializing as much   Patient Reported Schizophrenia/Schizoaffective Diagnosis in Past: No   Strengths: i will keep a secret, I don't mind working.  Preferences: No data recorded Abilities: dancing.  Can see both sides of a conflict.   Type of Services Patient Feels are Needed: individual counseling.   Initial Clinical Notes/Concerns: Increasing mood symptoms for the last 304 weeks.   Mental Health Symptoms Depression:  Fatigue; Difficulty Concentrating; Increase/decrease in appetite; Weight gain/loss; Worthlessness; Sleep (too much or little); Change in energy/activity   Duration of Depressive symptoms: Greater than two weeks   Mania:  None   Anxiety:   Sleep; Worrying; Fatigue; Difficulty concentrating   Psychosis:  None   Duration of Psychotic symptoms: No data recorded  Trauma:  None   Obsessions:  None   Compulsions:  None   Inattention:  None   Hyperactivity/Impulsivity:  None   Oppositional/Defiant Behaviors:  None   Emotional Irregularity:  None   Other Mood/Personality Symptoms:  No data recorded   Mental Status Exam Appearance and self-care  Stature:  Average   Weight:  Average weight   Clothing:  Casual   Grooming:  Normal   Cosmetic use:  None   Posture/gait:  Normal   Motor activity:  Not Remarkable   Sensorium  Attention:  Normal   Concentration:  Normal   Orientation:  X5   Recall/memory:  No data recorded   Affect and Mood  Affect:  Depressed   Mood:  Dysphoric   Relating  Eye contact:  Normal   Facial expression:  Sad   Attitude toward examiner:  Cooperative   Thought and Language  Speech flow: Normal   Thought content:  Appropriate to Mood and Circumstances   Preoccupation:  Ruminations   Hallucinations:  None   Organization:  No data recorded  [employer] of Knowledge:  Average   Intelligence:  Average   Abstraction:  Normal   Judgement:  Good   Reality Testing:  Adequate   Insight:  Fair   Decision Making:  Normal   Social Functioning  Social Maturity:  Responsible   Social Judgement:  Normal   Stress  Stressors:  changes in health  Coping Ability:  I don't want to burden my kids with my problems  Skill Deficits:  life transitions  Supports:  Has had to change PCP/Pharmacist and gym, has not been able to go dancing much due to health and knee problems and poor balance.    Religion: n/a    Leisure/Recreation: Stained glass, shag dancing, laughing, gym    Exercise/Diet: Was snacking a lot and gained about 12 pounds, now resolved to decreased snacking and sugar intake.     CCA Employment/Education Employment/Work Situation: retired    CCA Family/Childhood History Family and Relationship History: Family history Marital status: Divorced Divorced, when?: divorced x2, second divorce was 20 years ago after 6 months of marriage and was financially difficult. What types of issues is patient dealing with in the relationship?: relationship that  broke up 7 years ago is still on his mind. Are you sexually active?: No What is your sexual orientation?: heterosexual Does patient have children?: Yes How many children?: 2 (one son one daughter) How is patient's relationship with their children?: good, but they do not live nearby  Childhood History:  Childhood History By whom was/is the patient raised?: Both parents Did patient suffer any  verbal/emotional/physical/sexual abuse as a child?: No Did patient suffer from severe childhood neglect?: No Has patient ever been sexually abused/assaulted/raped as an adolescent or adult?: No Was the patient ever a victim of a crime or a disaster?: No Witnessed domestic violence?: No Has patient been affected by domestic violence as an adult?: No    CCA Substance Use Alcohol /Drug Use: Alcohol  / Drug Use History of alcohol  / drug use?: No history of alcohol  / drug abuse     Recommendations for Services/Supports/Treatments: Recommendations for Services/Supports/Treatments Recommendations For Services/Supports/Treatments: Medication Management, Individual Therapy  DSM5 Diagnoses: Patient Active Problem List   Diagnosis Date Noted   Abnormal stress test 06/17/2024   Osteoarthritis of both knees 04/10/2024   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   Lung cancer (HCC) 09/26/2023   Pain in finger 09/25/2023   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/22/2023   S/P lobectomy of lung 03/22/2023   Amnesia 02/08/2023   Lung nodule 01/19/2023   GERD (gastroesophageal reflux disease) 12/25/2022   Anxiety and depression 12/25/2022   Constipation 10/27/2022   Pain of upper abdomen 10/27/2022   Left leg pain 09/21/2022   Urinary tract infection associated with indwelling urethral catheter    BPH (benign prostatic hyperplasia) 01/30/2022   Acquired trigger finger of left index finger 02/04/2021   Bilateral carpal tunnel syndrome 02/04/2021   Acute lateral meniscus tear of left knee 06/27/2014   Acute medial meniscus tear of left knee    NSVT (nonsustained ventricular tachycardia) (HCC)    PVC (premature ventricular contraction) 06/10/2014   Non-cardiac chest pain 08/08/2011   Essential hypertension 08/08/2011   Hypothyroidism 08/08/2011    Patient Centered Plan: Patient is on the following Treatment Plan(s):  Depression  Collaboration of Care: None required at this visit      09/09/2024   10:25 AM 09/06/2024    2:30 PM 06/04/2024    2:09 PM  PHQ9 SCORE ONLY  PHQ-9 Total Score 14 0 7        09/09/2024   10:23 AM 06/04/2024    2:10 PM  GAD 7 : Generalized Anxiety Score  Nervous, Anxious, on Edge 0 1  Control/stop worrying 2 0  Worry too much - different things 2 1  Trouble relaxing 2 0  Restless 0 0  Easily annoyed or irritable 1 1  Afraid - awful might happen 0 0  Total GAD 7 Score 7 3  Anxiety Difficulty Somewhat difficult Not difficult at all    Summary:  Micheal Baker is a 78 year old divorced man who lives alone in [place].  He reports symptoms of depressed mood, difficulty staying asleep, worrying, rumination on the past, feeling lonely, feeling bad about himself, anhedonia, low energy and restlessness for the last 3-4 weeks.  He has recent started on an antidepressant through his PCP.  He had a counselor in [place] about 7 years ago during his last romantic relationship and this was helpful, but the counselor has retired.  He stated My health has changed so much, Im not sure how to feel  Micheal Baker spoke about the many medical problems he has had  that have affected his physical abilities and stamina, changing his ability to engage in his normal activities of gym and dancing.   I just get over one thing then a month later, something else happens.  I can't get back to my life.   Micheal Baker would like a partner but has a difficult time trusting because of past relationships.  Micheal Baker meets criteria for Major Depressive Disorder single episode due to the symptoms listed with a durations of 304 weeks by his report.  He has had a fleeting thought that if he was dead his children would at least have his money, but then realizes that he would leave a lot of heartache behind for everyone.  He has a grandfather who committed suicide, but he didn't know about this until he was in his 63s.  He denies any plan or intent to harm himself or others.  Appears to be at low risk  at this time although he does have risk factors of male sex, unmarried, health problems, and social isolation.  There is no substance use history.  Micheal Baker requests outpatient counseling for someone to talk to about all these things.  He felt counseling was helpful in the past.    Patient/Guardian was advised Release of Information must be obtained prior to any record release in order to collaborate their care with an outside provider. Patient/Guardian was advised if they have not already done so to contact the registration department to sign all necessary forms in order for us  to release information regarding their care.   Consent: Patient/Guardian gives verbal consent for treatment and assignment of benefits for services provided during this visit. Patient/Guardian expressed understanding and agreed to proceed.   Lauraine Ferrari, LCSW

## 2024-09-23 ENCOUNTER — Ambulatory Visit (HOSPITAL_COMMUNITY)

## 2024-09-23 ENCOUNTER — Encounter (HOSPITAL_COMMUNITY): Payer: Self-pay

## 2024-09-23 DIAGNOSIS — F321 Major depressive disorder, single episode, moderate: Secondary | ICD-10-CM

## 2024-09-23 NOTE — Progress Notes (Signed)
" ° °  THERAPIST PROGRESS NOTE  Session Time: 3:02 - 4:00  Participation Level: Active  Behavioral Response: CasualAlertDysphoric  Type of Therapy: Individual Therapy  Treatment Goals addressed: review treatment plan, begin identifying underlying facts and thought that have contributed to this episode.  ProgressTowards Goals: Initial  Interventions: CBT, Strength-based, Supportive, and Reframing  Summary: Micheal Baker is a 79 y.o. male who presents with  ruminations about past relationships, feeling aline, not sleeping well, feeling down,  not enjoying activities he used to enjoy, not socializing as much.  He reports I have trust issues with everyone  I am lonely, but I don't meet anyone I can trust  [name] is still involved with a woman he dated years ago who was not committed to the relationship.  Despite many attempts by [name] to break off this relationship he continues to answer her calls and feel hurt and angry about the way the relationship currently stands.  [name]'s emotions and unresolved communications about this relationship prevent him from participating in activities that he would that he would normally enjoy such as dancing and casually going out because he does not know how to handle himself when a woman is interested in him.  [name] also tends to ruminate on what might be wrong with him because his relationships do not work out in the way that he would like.  Suicidal/Homicidal: Nowithout intent/plan  Therapist Response: Therapist provided a supportive environment and worked with [name] to begin to identify themes in his relationships.  We began to discuss communication skills internal identification of feelings and have difficulties with trust can affect 1's ability to develop healthy relationships.  [name] was active and interested in the discussion and very participatory and requested to reschedule another appointment.  He does have an upcoming knee replacement surgery and was  asked to call if the next appointment will need to be missed.  Plan: Return again in 2 weeks.  Diagnosis: Major depressive disorder, single episode, moderate (HCC)  Collaboration of Care: none required at this visit  Patient/Guardian was advised Release of Information must be obtained prior to any record release in order to collaborate their care with an outside provider. Patient/Guardian was advised if they have not already done so to contact the registration department to sign all necessary forms in order for us  to release information regarding their care.   Consent: Patient/Guardian gives verbal consent for treatment and assignment of benefits for services provided during this visit. Patient/Guardian expressed understanding and agreed to proceed.   Lauraine Ferrari, LCSW 09/23/2024  "

## 2024-09-25 ENCOUNTER — Ambulatory Visit: Admitting: Nurse Practitioner

## 2024-09-25 ENCOUNTER — Encounter: Payer: Self-pay | Admitting: Nurse Practitioner

## 2024-09-25 DIAGNOSIS — Z01810 Encounter for preprocedural cardiovascular examination: Secondary | ICD-10-CM | POA: Diagnosis not present

## 2024-09-25 NOTE — Progress Notes (Signed)
 "   Virtual Visit via Telephone Note   Because of Micheal Baker co-morbid illnesses, he is at least at moderate risk for complications without adequate follow up.  This format is felt to be most appropriate for this patient at this time.  Due to technical limitations with video connection web designer), today's appointment will be conducted as an audio only telehealth visit, and Micheal Baker verbally agreed to proceed in this manner.   All issues noted in this document were discussed and addressed.  No physical exam could be performed with this format.  Evaluation Performed:  Preoperative cardiovascular risk assessment _____________   Date:  09/25/2024   Patient ID:  Micheal Baker, DOB February 12, 1946, MRN [id number] Patient Location:  Home Provider location:   Office  Primary Care Provider:  Aletha Bene, MD Primary Cardiologist:  Diannah SHAUNNA Maywood, MD  Chief Complaint / Patient Profile   79 y.o. y/o male with a h/o lung cancer, OSA, frequent PVCs, abnormal stress test 02/2023, angiographically normal epicardial coronary arteries, echo 07/08/24 with normal LVEF, grade 1 diastolic dysfunction, normal RV, mild AR and MR, mild dilatation of ascending aorta who is pending left total knee arthroplasty date TBD and presents today for telephonic preoperative cardiovascular risk assessment.  History of Present Illness    Micheal Baker is a 79 y.o. male who presents via audio/video conferencing for a telehealth visit today.  Pt was last seen in cardiology clinic on 06/17/24 by Dr. Mallipeddi.  At that time Micheal Baker was doing well.  The patient is now pending procedure as outlined above. Since his last visit, he  denies chest pain, shortness of breath, lower extremity edema, fatigue, palpitations, melena, hematuria, hemoptysis, diaphoresis, weakness, presyncope, syncope, orthopnea, and PND. He is very active with regular exercise including cardiovascular and weight lifting several days per week. He is  easily able to achieve > 4 METS activity without concerning cardiac symptoms.    Past Medical History    Past Medical History:  Diagnosis Date   Acute medial meniscus tear of left knee    Depression    Frequent PVCs    GERD (gastroesophageal reflux disease)    History of hiatal hernia    History of inguinal hernia    History of kidney stones    Hypertension    Hypothyroidism    Lung cancer (HCC)    NSVT (nonsustained ventricular tachycardia) (HCC)    Osteoarthritis    Sleep apnea    Thyroid  disease    Past Surgical History:  Procedure Laterality Date   BIOPSY  12/01/2022   Procedure: BIOPSY;  Surgeon: Cindie Carlin POUR, DO;  [facility];  Service: Endoscopy;;   BRONCHIAL BIOPSY  02/07/2023   Procedure: BRONCHIAL BIOPSIES;  Surgeon: Brenna Adine CROME, DO;  Location: [facility];  Service: Pulmonary;;   BRONCHIAL NEEDLE ASPIRATION BIOPSY  02/07/2023   Procedure: BRONCHIAL NEEDLE ASPIRATION BIOPSIES;  Surgeon: Brenna Adine CROME, DO;  Location: [facility];  Service: Pulmonary;;   CATARACT EXTRACTION     left   COLONOSCOPY N/A 04/08/2016   Surgeon: Margo CROME Haddock, MD; one 6 mm polyp in the cecum removed, one 4 mm polyp in the descending colon removed, nonbleeding internal hemorrhoids.  Pathology with hyperplastic polyps.  Recommended colonoscopy in 10 years.   COLONOSCOPY  12/01/2022   COLONOSCOPY WITH PROPOFOL  N/A 12/01/2022   Procedure: COLONOSCOPY WITH PROPOFOL ;  Surgeon: Cindie Carlin POUR, DO;  [facility];  Service: Endoscopy;  Laterality:  N/A;  8:15 am, asa 3   endosocopy  12/01/2022   ESOPHAGOGASTRODUODENOSCOPY (EGD) WITH PROPOFOL  N/A 12/01/2022   Procedure: ESOPHAGOGASTRODUODENOSCOPY (EGD) WITH PROPOFOL ;  Surgeon: Cindie Carlin POUR, DO;  [facility];  Service: Endoscopy;  Laterality: N/A;   EYE SURGERY     left-scar tissue   FIDUCIAL MARKER PLACEMENT  02/07/2023   Procedure: FIDUCIAL MARKER PLACEMENT;  Surgeon: Brenna Adine CROME, DO;   Location: [facility];  Service: Pulmonary;;   groin surgery Right 2010   growth removed    KNEE ARTHROSCOPY     left   KNEE ARTHROSCOPY WITH LATERAL MENISECTOMY Right 03/11/2015   Procedure: RIGHT KNEE ARTHROSCOPY WITH MEDIALAND LATERAL MENISECTOMY;  Surgeon: Lamar Millman, MD;  Location: [facility];  Service: Orthopedics;  Laterality: Right;   KNEE ARTHROSCOPY WITH MEDIAL MENISECTOMY Left 06/27/2014   Procedure: LEFT KNEE ARTHROSCOPY WITH PARTIAL MEDIAL AND LATERAL MENISECTOMIES AND CHONDROPLASTY;  Surgeon: Lamar DELENA Millman, MD;  Location: [facility];  Service: Orthopedics;  Laterality: Left;   KNEE ARTHROSCOPY WITH MEDIAL MENISECTOMY Right 03/11/2015   Procedure: KNEE ARTHROSCOPY WITH MEDIAL MENISECTOMY;  Surgeon: Lamar Millman, MD;  Location:  SURGERY CENTER;  Service: Orthopedics;  Laterality: Right;   LEFT HEART CATHETERIZATION WITH CORONARY ANGIOGRAM N/A 09/14/2011   Procedure: LEFT HEART CATHETERIZATION WITH CORONARY ANGIOGRAM;  Surgeon: Vinie KYM Maxcy, MD;  Location: [facility];  Service: Cardiovascular;  Laterality: N/A;   LYMPH NODE DISSECTION Left 03/22/2023   Procedure: LYMPH NODE DISSECTION;  Surgeon: Shyrl Linnie KIDD, MD;  Location: [facility];  Service: Thoracic;  Laterality: Left;   POLYPECTOMY  04/08/2016   Procedure: POLYPECTOMY;  Surgeon: Margo CROME Haddock, MD;  [facility];  Service: Endoscopy;;  colon    POLYPECTOMY  12/01/2022   POLYPECTOMY  12/01/2022   Procedure: POLYPECTOMY;  Surgeon: Cindie Carlin POUR, DO;  [facility];  Service: Endoscopy;;   RETINAL DETACHMENT SURGERY Left 2002   Dr. Carmela    Allergies  Allergies[1]  Home Medications    Prior to Admission medications  Medication Sig Start Date End Date Taking? Authorizing Provider  acetaminophen  (TYLENOL ) 500 MG tablet Take 500-1,000 mg by mouth every 6 (six) hours as needed for mild pain, headache or moderate pain.    [provider]  ALPHA LIPOIC ACID PO Take 1 capsule by mouth in the morning.    [provider]  B Complex-C (B-COMPLEX WITH VITAMIN C) tablet Take 1 tablet by mouth in the morning.    [provider]  benzonatate  (TESSALON ) 100 MG capsule Take 1 capsule (100 mg total) by mouth 3 (three) times daily as needed for cough. Do not take with alcohol  or while operating or driving heavy machinery 88/2/74   Chandra Harlene DELENA, NP  buPROPion  (WELLBUTRIN  XL) 300 MG 24 hr tablet Take 300 mg by mouth daily. 02/06/24   [provider]  cyclobenzaprine  (FLEXERIL ) 5 MG tablet Take 1 tablet (5 mg total) by mouth at bedtime. For muscle spasms (night time) 06/18/24   Aletha Bene, MD  diclofenac Sodium (VOLTAREN) 1 % GEL Apply 1 Application topically 4 (four) times daily as needed (knee pain).    [provider]  finasteride  (PROSCAR ) 5 MG tablet TAKE ONE TABLET BY MOUTH ONCE DAILY. 12/08/23   McKenzie, Belvie CROME, MD  fluticasone  (FLONASE ) 50 MCG/ACT nasal spray Place 1 spray into both nostrils daily as needed for allergies. 06/04/24   Aletha Bene, MD  levothyroxine  (SYNTHROID )  88 MCG tablet Take 1 tablet (88 mcg total) by mouth daily before breakfast. 02/09/23 09/09/24  Pearlean Manus, MD  losartan  (COZAAR ) 50 MG tablet Take 1 tablet (50 mg total) by mouth daily. 08/27/24   Aletha Bene, MD  meclizine  (ANTIVERT ) 25 MG tablet Take 1 tablet (25 mg total) by mouth 3 (three) times daily as needed for dizziness. 02/14/24   Dean Clarity, MD  meloxicam  (MOBIC ) 15 MG tablet Take 1 tablet (15 mg total) by mouth daily. 07/26/24   Hyatt, Max T, DPM  methocarbamol  (ROBAXIN ) 500 MG tablet Take 1 tablet (500 mg total) by mouth every 8 (eight) hours as needed. During day for back spasm 06/18/24   Aletha Bene, MD  pantoprazole  (PROTONIX ) 40 MG tablet TAKE ONE TABLET BY MOUTH ONCE DAILY. 04/29/24   Kennedy Charmaine CROME, NP  Propylene Glycol (SYSTANE COMPLETE) 0.6 % SOLN Place 1 drop into both eyes as  needed (dry eyes).    [provider]  rosuvastatin  (CRESTOR ) 20 MG tablet Take 1 tablet (20 mg total) by mouth daily. 07/31/24   Aletha Bene, MD  sertraline  (ZOLOFT ) 25 MG tablet Take 1 tablet (25 mg total) by mouth daily. 08/27/24   Aletha Bene, MD  silodosin  (RAPAFLO ) 8 MG CAPS capsule Take 1 capsule (8 mg total) by mouth 2 (two) times daily. 10/06/23   McKenzie, Belvie CROME, MD    Physical Exam    Vital Signs:  Micheal Baker does not have vital signs available for review today.  Given telephonic nature of communication, physical exam is limited. AAOx3. NAD. Normal affect.  Speech and respirations are unlabored.  Accessory Clinical Findings    None  Assessment & Plan    1.  Preoperative Cardiovascular Risk Assessment: According to the Revised Cardiac Risk Index (RCRI), his Perioperative Risk of Major Cardiac Event is (%): 0.9. His Functional Capacity in METs is: 7.77 according to the Duke Activity Status Index (DASI). The patient is doing well from a cardiac perspective. Therefore, based on ACC/AHA guidelines, the patient would be at acceptable risk for the planned procedure without further cardiovascular testing.   The patient was advised that if he develops new symptoms prior to surgery to contact our office to arrange for a follow-up visit, and he verbalized understanding.  No request to hold cardiac medications.  A copy of this note will be routed to requesting surgeon.  Time:   Today, I have spent 10 minutes with the patient with telehealth technology discussing medical history, symptoms, and management plan.     Rosaline EMERSON Bane, NP-C  09/25/2024, 2:19 PM [address], [address] [place], [place] [address] Office [phone] Fax [phone]      [1]  Allergies Allergen Reactions   Avelox [Moxifloxacin] Other (See Comments)    Caused tendonitis   Penicillins Other (See Comments)    Chills    "

## 2024-09-26 ENCOUNTER — Ambulatory Visit (HOSPITAL_COMMUNITY)
Admission: RE | Admit: 2024-09-26 | Discharge: 2024-09-26 | Disposition: A | Discharge status: Home / Self Care | Source: Ambulatory Visit | Attending: Internal Medicine | Admitting: Internal Medicine

## 2024-09-26 DIAGNOSIS — C3490 Malignant neoplasm of unspecified part of unspecified bronchus or lung: Secondary | ICD-10-CM | POA: Diagnosis present

## 2024-09-26 MED ORDER — IOHEXOL 300 MG/ML  SOLN
75.0000 mL | Freq: Once | INTRAMUSCULAR | Status: AC | PRN
  Administered 2024-09-26: 75 mL via INTRAVENOUS

## 2024-10-02 ENCOUNTER — Encounter: Payer: Self-pay | Admitting: Family Medicine

## 2024-10-02 ENCOUNTER — Other Ambulatory Visit: Payer: Self-pay | Admitting: Urology

## 2024-10-02 ENCOUNTER — Ambulatory Visit: Admitting: Family Medicine

## 2024-10-02 VITALS — BP 134/72 | HR 94 | Ht 77.0 in | Wt 214.1 lb

## 2024-10-02 DIAGNOSIS — F32A Depression, unspecified: Secondary | ICD-10-CM | POA: Diagnosis not present

## 2024-10-02 DIAGNOSIS — M25562 Pain in left knee: Secondary | ICD-10-CM | POA: Diagnosis not present

## 2024-10-02 DIAGNOSIS — E039 Hypothyroidism, unspecified: Secondary | ICD-10-CM

## 2024-10-02 DIAGNOSIS — N401 Enlarged prostate with lower urinary tract symptoms: Secondary | ICD-10-CM

## 2024-10-02 DIAGNOSIS — I10 Essential (primary) hypertension: Secondary | ICD-10-CM | POA: Diagnosis not present

## 2024-10-02 DIAGNOSIS — F419 Anxiety disorder, unspecified: Secondary | ICD-10-CM | POA: Diagnosis not present

## 2024-10-02 DIAGNOSIS — G8929 Other chronic pain: Secondary | ICD-10-CM

## 2024-10-02 NOTE — Progress Notes (Signed)
 "  Patient Office Visit  Assessment & Plan:  Anxiety and depression  Essential hypertension  Chronic pain of left knee  Hypothyroidism, unspecified type   Assessment and Plan    Depression and anxiety Improving with current treatment. Over 50% improvement in mood since December. No current need to adjust medication dosage. - Continue sertraline  25 mg daily. - Continue Wellbutrin  300 mg daily. - Continue bi-weekly therapy sessions. - Consider increasing sertraline  to 50 mg if mood does not continue to improve.  Chronic left knee pain Persists. Cleared by cardiologist for surgery. Recent CT scan showed no cancer.     Continue Zoloft  25 mg once a day and Wellbutrin  XL 300 mg once a day.  Return in 6 to 8 weeks or sooner if necessary.  Return in about 8 weeks (around 11/27/2024), or if symptoms worsen or fail to improve, for depression.   Subjective:    Patient ID: Micheal Baker, male    DOB: Jan 08, 1946  Age: 79 y.o. MRN: [id number]  Chief Complaint  Patient presents with   Medical Management of Chronic Issues    HPI Discussed the use of AI scribe software for clinical note transcription with the patient, who gave verbal consent to proceed.      History of Present Illness Micheal Baker is a 79 year old male who presents for follow-up on his depression and follow up chronic medical issues  His mood has improved since the last visit, attributed to medication adding Zoloft , social interactions, and dietary changes. He is taking sertraline  25 mg and Wellbutrin  300 mg in the morning, feeling more than 50% better compared to a month ago. He attends counseling sessions every two weeks.   He has been cleared by his cardiologist for knee surgery, though a date is not set. Preoperative procedures are pending beyond a cardiologist phone consultation. Patient will have left knee surgery at [facility]  He maintains a good appetite, eating three meals daily, and has lost weight since  Christmas due to reduced sugar intake. He has started iron and B12 supplements but is unsure of their impact but overall he is feeling better  He received a flu shot and pneumonia vaccine recently. Both old and new shingles vaccines were administered at a pharmacy and are not in his current medical records.  He recalls experiencing PVCs in the past, noted during a previous EKG, but denies current chest pain or shortness of breath  Results Radiology CT scan chest: No evidence of malignancy  Diagnostic EKG (08/2024): Premature ventricular contractions  Assessment and Plan Depression and anxiety Improving with current treatment. Over 50% improvement in mood since December. No current need to adjust medication dosage. - Continue sertraline  25 mg daily. - Continue Wellbutrin  300 mg daily. - Continue bi-weekly therapy sessions. - Consider increasing sertraline  to 50 mg if mood does not continue to improve.  Chronic left knee pain Persists. Cleared by cardiologist for surgery. Recent CT scan showed no cancer.    The 10-year ASCVD risk score (Arnett DK, et al., 2019) is: 34.1%  Past Medical History:  Diagnosis Date   Acute medial meniscus tear of left knee    Depression    Frequent PVCs    GERD (gastroesophageal reflux disease)    History of hiatal hernia    History of inguinal hernia    History of kidney stones    Hypertension    Hypothyroidism    Lung cancer (HCC)    NSVT (nonsustained ventricular tachycardia) (HCC)  Osteoarthritis    Sleep apnea    Thyroid  disease    Past Surgical History:  Procedure Laterality Date   BIOPSY  12/01/2022   Procedure: BIOPSY;  Surgeon: Cindie Carlin POUR, DO;  Location: [facility];  Service: Endoscopy;;   BRONCHIAL BIOPSY  02/07/2023   Procedure: BRONCHIAL BIOPSIES;  Surgeon: Brenna Adine CROME, DO;  Location: [facility];  Service: Pulmonary;;   BRONCHIAL NEEDLE ASPIRATION BIOPSY  02/07/2023   Procedure: BRONCHIAL NEEDLE ASPIRATION  BIOPSIES;  Surgeon: Brenna Adine CROME, DO;  Location: [facility];  Service: Pulmonary;;   CATARACT EXTRACTION     left   COLONOSCOPY N/A 04/08/2016   Surgeon: Margo CROME Haddock, MD; one 6 mm polyp in the cecum removed, one 4 mm polyp in the descending colon removed, nonbleeding internal hemorrhoids.  Pathology with hyperplastic polyps.  Recommended colonoscopy in 10 years.   COLONOSCOPY  12/01/2022   COLONOSCOPY WITH PROPOFOL  N/A 12/01/2022   Procedure: COLONOSCOPY WITH PROPOFOL ;  Surgeon: Cindie Carlin POUR, DO;  Location: [facility];  Service: Endoscopy;  Laterality: N/A;  8:15 am, asa 3   endosocopy  12/01/2022   ESOPHAGOGASTRODUODENOSCOPY (EGD) WITH PROPOFOL  N/A 12/01/2022   Procedure: ESOPHAGOGASTRODUODENOSCOPY (EGD) WITH PROPOFOL ;  Surgeon: Cindie Carlin POUR, DO;  Location: [facility];  Service: Endoscopy;  Laterality: N/A;   EYE SURGERY     left-scar tissue   FIDUCIAL MARKER PLACEMENT  02/07/2023   Procedure: FIDUCIAL MARKER PLACEMENT;  Surgeon: Brenna Adine CROME, DO;  Location: [facility];  Service: Pulmonary;;   groin surgery Right 2010   growth removed    KNEE ARTHROSCOPY     left   KNEE ARTHROSCOPY WITH LATERAL MENISECTOMY Right 03/11/2015   Procedure: RIGHT KNEE ARTHROSCOPY WITH MEDIALAND LATERAL MENISECTOMY;  Surgeon: Lamar Millman, MD;  Location: [facility];  Service: Orthopedics;  Laterality: Right;   KNEE ARTHROSCOPY WITH MEDIAL MENISECTOMY Left 06/27/2014   Procedure: LEFT KNEE ARTHROSCOPY WITH PARTIAL MEDIAL AND LATERAL MENISECTOMIES AND CHONDROPLASTY;  Surgeon: Lamar DELENA Millman, MD;  Location: [facility];  Service: Orthopedics;  Laterality: Left;   KNEE ARTHROSCOPY WITH MEDIAL MENISECTOMY Right 03/11/2015   Procedure: KNEE ARTHROSCOPY WITH MEDIAL MENISECTOMY;  Surgeon: Lamar Millman, MD;  Location: [facility];  Service: Orthopedics;  Laterality: Right;   LEFT HEART CATHETERIZATION WITH CORONARY ANGIOGRAM N/A 09/14/2011    Procedure: LEFT HEART CATHETERIZATION WITH CORONARY ANGIOGRAM;  Surgeon: Vinie KYM Maxcy, MD;  Location: [facility];  Service: Cardiovascular;  Laterality: N/A;   LYMPH NODE DISSECTION Left 03/22/2023   Procedure: LYMPH NODE DISSECTION;  Surgeon: Shyrl Linnie KIDD, MD;  Location: [facility];  Service: Thoracic;  Laterality: Left;   POLYPECTOMY  04/08/2016   Procedure: POLYPECTOMY;  Surgeon: Margo CROME Haddock, MD;  [facility]: [facility];  Service: Endoscopy;;  colon    POLYPECTOMY  12/01/2022   POLYPECTOMY  12/01/2022   Procedure: POLYPECTOMY;  Surgeon: Cindie Carlin POUR, DO;  Location: [facility];  Service: Endoscopy;;   RETINAL DETACHMENT SURGERY Left 2002   Dr. Carmela   Social History[1] Family History  Problem Relation Age of Onset   Hypertension Mother    Hypertension Father    Heart attack Father    Diabetes Father    Asthma Daughter    Colon cancer Neg Hx    Stomach cancer Neg Hx    Allergies[2]  ROS    Objective:    BP 134/72   Pulse 94   Ht 6' 5 (1.956  m)   Wt 214 lb 2 oz (97.1 kg)   SpO2 97%   BMI 25.39 kg/m  BP Readings from Last 3 Encounters:  10/02/24 134/72  09/06/24 (!) 144/82  08/27/24 (!) 160/80   Wt Readings from Last 3 Encounters:  10/02/24 214 lb 2 oz (97.1 kg)  09/06/24 218 lb 6.4 oz (99.1 kg)  08/27/24 216 lb 4 oz (98.1 kg)    Physical Exam Vitals and nursing note reviewed.  Constitutional:      General: He is not in acute distress.    Appearance: Normal appearance.     Comments: Using crutches due to knee pain  HENT:     Head: Normocephalic.     Right Ear: Tympanic membrane, ear canal and external ear normal.     Left Ear: Tympanic membrane, ear canal and external ear normal.  Eyes:     Extraocular Movements: Extraocular movements intact.     Conjunctiva/sclera: Conjunctivae normal.     Pupils: Pupils are equal, round, and reactive to light.  Cardiovascular:     Rate and Rhythm: Normal rate and regular rhythm.     Heart sounds:  Normal heart sounds.  Pulmonary:     Effort: Pulmonary effort is normal.     Breath sounds: Normal breath sounds.  Musculoskeletal:     Right lower leg: No edema.     Left lower leg: No edema.  Neurological:     General: No focal deficit present.     Mental Status: He is alert and oriented to person, place, and time.  Psychiatric:        Mood and Affect: Mood normal.        Behavior: Behavior normal.      No results found for any visits on 10/02/24.          [1]  Social History Tobacco Use   Smoking status: Never    Passive exposure: Never   Smokeless tobacco: Never   Tobacco comments:    06/10/14 1968- Tried it once and didn't like it- AJ  Vaping Use   Vaping status: Never Used  Substance Use Topics   Alcohol  use: Yes    Alcohol /week: 0.0 standard drinks of alcohol     Comment: occasionally   Drug use: No  [2]  Allergies Allergen Reactions   Avelox [Moxifloxacin] Other (See Comments)    Caused tendonitis   Penicillins Other (See Comments)    Chills    "

## 2024-10-07 ENCOUNTER — Ambulatory Visit (HOSPITAL_COMMUNITY)

## 2024-10-07 DIAGNOSIS — F321 Major depressive disorder, single episode, moderate: Secondary | ICD-10-CM
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
## 2024-10-09 ENCOUNTER — Inpatient Hospital Stay

## 2024-10-09 ENCOUNTER — Inpatient Hospital Stay: Admitting: Internal Medicine

## 2024-10-11 ENCOUNTER — Emergency Department (HOSPITAL_COMMUNITY)
Admission: EM | Admit: 2024-10-11 | Discharge: 2024-10-11 | Disposition: A | Discharge status: Home / Self Care | Attending: Emergency Medicine | Admitting: Emergency Medicine

## 2024-10-11 ENCOUNTER — Ambulatory Visit: Payer: Self-pay

## 2024-10-11 DIAGNOSIS — Z85118 Personal history of other malignant neoplasm of bronchus and lung: Secondary | ICD-10-CM | POA: Insufficient documentation

## 2024-10-11 DIAGNOSIS — I10 Essential (primary) hypertension: Secondary | ICD-10-CM | POA: Diagnosis not present

## 2024-10-11 DIAGNOSIS — Z79899 Other long term (current) drug therapy: Secondary | ICD-10-CM | POA: Insufficient documentation

## 2024-10-11 DIAGNOSIS — E039 Hypothyroidism, unspecified: Secondary | ICD-10-CM | POA: Diagnosis not present

## 2024-10-11 DIAGNOSIS — G629 Polyneuropathy, unspecified: Secondary | ICD-10-CM | POA: Diagnosis not present

## 2024-10-11 DIAGNOSIS — R200 Anesthesia of skin: Secondary | ICD-10-CM | POA: Diagnosis present

## 2024-10-11 LAB — COMPREHENSIVE METABOLIC PANEL WITH GFR
ALT: 27 U/L (ref 0–44)
AST: 31 U/L (ref 15–41)
Albumin: 4.3 g/dL (ref 3.5–5.0)
Alkaline Phosphatase: 79 U/L (ref 38–126)
Anion gap: 14 (ref 5–15)
BUN: 16 mg/dL (ref 8–23)
CO2: 23 mmol/L (ref 22–32)
Calcium: 9.3 mg/dL (ref 8.9–10.3)
Chloride: 101 mmol/L (ref 98–111)
Creatinine, Ser: 0.89 mg/dL (ref 0.61–1.24)
GFR, Estimated: >60 mL/min
Glucose, Bld: 135 mg/dL — ABNORMAL HIGH (ref 70–99)
Potassium: 3.8 mmol/L (ref 3.5–5.1)
Sodium: 139 mmol/L (ref 135–145)
Total Bilirubin: 0.5 mg/dL (ref 0.0–1.2)
Total Protein: 7.1 g/dL (ref 6.5–8.1)

## 2024-10-11 LAB — CBC WITH DIFFERENTIAL/PLATELET
Abs Immature Granulocytes: 0.01 10*3/uL (ref 0.00–0.07)
Basophils Absolute: 0 10*3/uL (ref 0.0–0.1)
Basophils Relative: 0 %
Eosinophils Absolute: 0.3 10*3/uL (ref 0.0–0.5)
Eosinophils Relative: 5 %
HCT: 40.3 % (ref 39.0–52.0)
Hemoglobin: 13.5 g/dL (ref 13.0–17.0)
Immature Granulocytes: 0 %
Lymphocytes Relative: 15 %
Lymphs Abs: 0.9 10*3/uL (ref 0.7–4.0)
MCH: 29.7 pg (ref 26.0–34.0)
MCHC: 33.5 g/dL (ref 30.0–36.0)
MCV: 88.6 fL (ref 80.0–100.0)
Monocytes Absolute: 0.4 10*3/uL (ref 0.1–1.0)
Monocytes Relative: 7 %
Neutro Abs: 4.1 10*3/uL (ref 1.7–7.7)
Neutrophils Relative %: 73 %
Platelets: 242 10*3/uL (ref 150–400)
RBC: 4.55 MIL/uL (ref 4.22–5.81)
RDW: 12.8 % (ref 11.5–15.5)
WBC: 5.7 10*3/uL (ref 4.0–10.5)
nRBC: 0 % (ref 0.0–0.2)

## 2024-10-11 LAB — URINALYSIS, ROUTINE W REFLEX MICROSCOPIC
Bilirubin Urine: NEGATIVE
Glucose, UA: NEGATIVE mg/dL
Hgb urine dipstick: NEGATIVE
Ketones, ur: NEGATIVE mg/dL
Leukocytes,Ua: NEGATIVE
Nitrite: NEGATIVE
Protein, ur: NEGATIVE mg/dL
Specific Gravity, Urine: 1.005 (ref 1.005–1.030)
pH: 7 (ref 5.0–8.0)

## 2024-10-11 LAB — MAGNESIUM: Magnesium: 2.2 mg/dL (ref 1.7–2.4)

## 2024-10-11 NOTE — Discharge Instructions (Signed)
 Lab work and exam are reassuring today.  I have given you a neurology follow-up they will call you for further assessment of your neuropathy, if you do not hear from them within 2 business days please give them a call to establish an appointment.  If you experience worsening symptoms such as your entire arm or hand going numb or losing strength in any extremity, the ability to walk or talk, or visual disturbances please return to the emergency department for further evaluation.

## 2024-10-11 NOTE — Telephone Encounter (Signed)
 FYI Only or Action Required?: FYI only for provider: ED advised.  Patient was last seen in primary care on 10/02/2024 by Aletha Bene, MD.  Called Nurse Triage reporting Numbness.  Symptoms began several days ago.  Interventions attempted: Rest, hydration, or home remedies.  Symptoms are: gradually worsening.  Triage Disposition: Call EMS [phone] Now  Patient/caregiver understands and will follow disposition?: Yes    Message from [facility] sent at 10/11/2024  2:08 PM EST  Reason for Triage: Patient has been experiencing numbness in his fingers since Tuesday   Reason for Disposition  [1] Numbness (i.e., loss of sensation) of the face, arm / hand, or leg / foot on one side of the body AND [2] sudden onset AND [3] present now  Answer Assessment - Initial Assessment Questions 2-3 days of progressive numbness of the fingertips on his left hand.  Started with left index fingertip Weds, to left ring finger Thurs, Left middle finger earlier today and now Right pinkie finger.   Bottom of Feet were more numb than normal- 2-3 days ago- has neuropathy Right foot swollen  C7 fracture back in January  Chiro yesterday - numbness in right buttocks- sciatica   Denies pain to the fingertips. Knows they are there but cannot feel them. Causing issues with grasping items. Sudden progressive onset . Neuropathy in feet was slow progression over months  Denies unilateral weakness, vision changes, speech changes.   Surgery recently and was in Afib , has hx of PVCs and NSVT.  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 Was slow progression- this feels different PT for Balance since Sepsis-   Advised ED for evaluation of numbness to rule out Cord impingement, TIA/CVA due to high risk with pAfib  1. SYMPTOM: What is the main symptom you are concerned about? (e.g., weakness, numbness)     Numbness in left fingertips  2. ONSET: When did this start? (e.g.,  minutes, hours, days; while sleeping)     2-3days ago  3. LAST NORMAL: When was the last time you (the patient) were normal (no symptoms)?     3 days ago  4. PATTERN Does this come and go, or has it been constant since it started?  Is it present now?     Constant- progressing to other fingers  5. CARDIAC SYMPTOMS: Have you had any of the following symptoms: chest pain, difficulty breathing, palpitations?     Pafib, PVCs 6. NEUROLOGIC SYMPTOMS: Have you had any of the following symptoms: headache, dizziness, vision loss, double vision, changes in speech, unsteady on your feet?     Unsteady at baseline- unsure if worse. Denies other neuro beside numbness 7. OTHER SYMPTOMS: Do you have any other symptoms?     denies  Protocols used: Neurologic Deficit-A-AH

## 2024-10-11 NOTE — ED Provider Notes (Signed)
 "  EMERGENCY DEPARTMENT AT [facility] Provider Note   CSN: [id number] Arrival date & time: 10/11/24  1441     Patient presents with: hand numbness    Micheal Baker is a 79 y.o. male.  79 year old male presents emergency department with complaints of neuropathy in bilateral hands.  Patient reports he has neuropathy in his feet.  Patient reports he has seen his PCP for this and she has checked his A1c without any concerning findings.  Patient reports he has not seen neurology but does have an appointment.  When he called his PCP today to tell her that he was having neuropathy symptoms in his hands she advised her to go to the emergency department.  Patient reports starting Tuesday he had some decrease sensation in the tip of his left index finger.  The next day he had some decrease sensation in the tip of his left pinky finger.  Patient reports today he had decree sensation in the tip of his right pinky finger that prompted him to call his PCP.  Patient reports since Tuesday the symptoms have gotten a little bit better but he feels like there is still there.  Patient reports that his symptoms have significantly improved currently.  Patient has a significant history of hypertension depression neuropathy, lung cancer, thyroid  disease, hypothyroidism.      Prior to Admission medications  Medication Sig Start Date End Date Taking? Authorizing Provider  acetaminophen  (TYLENOL ) 500 MG tablet Take 500-1,000 mg by mouth every 6 (six) hours as needed for mild pain, headache or moderate pain.    [provider]  ALPHA LIPOIC ACID PO Take 1 capsule by mouth in the morning.    [provider]  B Complex-C (B-COMPLEX WITH VITAMIN C) tablet Take 1 tablet by mouth in the morning.    [provider]  benzonatate  (TESSALON ) 100 MG capsule Take 1 capsule (100 mg total) by mouth 3 (three) times daily as needed for cough. Do not take with alcohol  or while operating or  driving heavy machinery 88/2/74   Chandra Harlene LABOR, NP  buPROPion  (WELLBUTRIN  XL) 300 MG 24 hr tablet Take 300 mg by mouth daily. 02/06/24   [provider]  cyclobenzaprine  (FLEXERIL ) 5 MG tablet Take 1 tablet (5 mg total) by mouth at bedtime. For muscle spasms (night time) 06/18/24   Aletha Bene, MD  diclofenac Sodium (VOLTAREN) 1 % GEL Apply 1 Application topically 4 (four) times daily as needed (knee pain).    [provider]  finasteride  (PROSCAR ) 5 MG tablet TAKE ONE TABLET BY MOUTH ONCE DAILY. 12/08/23   McKenzie, Belvie CROME, MD  fluticasone  (FLONASE ) 50 MCG/ACT nasal spray Place 1 spray into both nostrils daily as needed for allergies. 06/04/24   Aletha Bene, MD  levothyroxine  (SYNTHROID ) 88 MCG tablet Take 1 tablet (88 mcg total) by mouth daily before breakfast. 02/09/23 10/02/24  Pearlean Manus, MD  losartan  (COZAAR ) 50 MG tablet Take 1 tablet (50 mg total) by mouth daily. 08/27/24   Aletha Bene, MD  meclizine  (ANTIVERT ) 25 MG tablet Take 1 tablet (25 mg total) by mouth 3 (three) times daily as needed for dizziness. 02/14/24   Dean Clarity, MD  meloxicam  (MOBIC ) 15 MG tablet Take 1 tablet (15 mg total) by mouth daily. 07/26/24   Hyatt, Max T, DPM  methocarbamol  (ROBAXIN ) 500 MG tablet Take 1 tablet (500 mg total) by mouth every 8 (eight) hours as needed. During day for back spasm 06/18/24  Aletha Bene, MD  pantoprazole  (PROTONIX ) 40 MG tablet TAKE ONE TABLET BY MOUTH ONCE DAILY. 04/29/24   Kennedy Charmaine CROME, NP  Propylene Glycol (SYSTANE COMPLETE) 0.6 % SOLN Place 1 drop into both eyes as needed (dry eyes).    [provider]  rosuvastatin  (CRESTOR ) 20 MG tablet Take 1 tablet (20 mg total) by mouth daily. 07/31/24   Aletha Bene, MD  sertraline  (ZOLOFT ) 25 MG tablet Take 1 tablet (25 mg total) by mouth daily. 08/27/24   Aletha Bene, MD  silodosin  (RAPAFLO ) 8 MG CAPS capsule Take 1 capsule (8 mg total) by mouth 2 (two) times daily. 10/03/24    McKenzie, Belvie CROME, MD    Allergies: Avelox [moxifloxacin] and Penicillins    Review of Systems  Neurological:  Positive for numbness.  All other systems reviewed and are negative.   Updated Vital Signs BP (!) 181/104   Pulse 64   Temp 98.4 F (36.9 C) (Oral)   Resp 14   Ht 6' 5 (1.956 m)   Wt 95.3 kg   SpO2 97%   BMI 24.90 kg/m   Physical Exam Vitals and nursing note reviewed.  Constitutional:      General: He is not in acute distress.    Appearance: Normal appearance. He is not ill-appearing or toxic-appearing.  HENT:     Head: Normocephalic and atraumatic.     Nose: Nose normal.  Eyes:     Extraocular Movements: Extraocular movements intact.     Conjunctiva/sclera: Conjunctivae normal.     Pupils: Pupils are equal, round, and reactive to light.  Cardiovascular:     Rate and Rhythm: Normal rate.  Pulmonary:     Effort: Pulmonary effort is normal. No respiratory distress.     Breath sounds: Normal breath sounds.  Musculoskeletal:        General: Normal range of motion.     Cervical back: Normal range of motion. No rigidity or tenderness.  Skin:    General: Skin is warm.     Capillary Refill: Capillary refill takes less than 2 seconds.  Neurological:     General: No focal deficit present.     Mental Status: He is alert.     Comments: No cranial nerve deficits, no weakness noted, the patient has normal gait without difficulty or assistance.  No abnormalities to balance.  Patient currently has full sensation throughout bilateral hands.  Psychiatric:        Mood and Affect: Mood normal.        Behavior: Behavior normal.     (all labs ordered are listed, but only abnormal results are displayed) Labs Reviewed  COMPREHENSIVE METABOLIC PANEL WITH GFR - Abnormal; Notable for the following components:      Result Value   Glucose, Bld 135 (*)    All other components within normal limits  URINALYSIS, ROUTINE W REFLEX MICROSCOPIC - Abnormal; Notable for the  following components:   APPearance HAZY (*)    All other components within normal limits  CBC WITH DIFFERENTIAL/PLATELET  MAGNESIUM    EKG: None  Radiology: No results found.  Procedures   Medications Ordered in the ED - No data to display  79 y.o. male presents to the ED with complaints of decree sensation in his fingertips bilaterally, The differential diagnosis includes neuropathy, Raynaud's, limb ischemia, diabetes, electrolyte abnormality, anemia (Ddx)  On arrival pt is nontoxic, vitals significant for hypertension, patient has significant history of hypertension. Exam is unremarkable on initial evaluation  Lab Tests:  CMP CBC UA magnesium\ No concerning findings  ED Course:   Patient is sitting comfortably in ED bed in no acute distress nontoxic-appearing on initial evaluation.  Symptoms have currently resolved.  Patient has had neuropathy in his feet bilaterally for several years without neurology follow-up.  Patient reports he has mentioned it to his PCP in the past and she has established an appointment for neurology he believes in May or June.  Symptoms are consistent with neuropathy.  No current focal deficits or weakness and symptoms have been waxing waning since Tuesday low concern for stroke.  No significant findings on lab work exam is overall unremarkable.  Patient was advised to follow-up with neurology and given referral.  He was advised for patient to follow-up with PCP and have a dedicated visit for her neuropathy.  Patient was advised of strict return precautions and agreed with treatment plan.  Patient was comfortable with discharge at this time.  Portions of this note were generated with Scientist, clinical (histocompatibility and immunogenetics). Dictation errors may occur despite best attempts at proofreading.   Final diagnoses:  Neuropathy    ED Discharge Orders          Ordered    Ambulatory referral to Neurology       Comments: An appointment is requested in approximately: 1 week    10/11/24 2234               Myriam Fonda RAMAN, [place] 10/11/24 2328  "

## 2024-10-11 NOTE — ED Triage Notes (Signed)
 Pt comes in for hand numbness. Numbness started in left hand pointing finger on Tuesday. It has now progressed to the whole hand and right pinky finger. A&Ox4.   Pt has a hx of neuropathy.   Pt did take his BP meds

## 2024-10-14 ENCOUNTER — Inpatient Hospital Stay: Admitting: Internal Medicine

## 2024-10-14 ENCOUNTER — Inpatient Hospital Stay

## 2024-10-14 DIAGNOSIS — C3490 Malignant neoplasm of unspecified part of unspecified bronchus or lung: Secondary | ICD-10-CM

## 2024-10-16 ENCOUNTER — Encounter: Payer: Self-pay | Admitting: Family Medicine

## 2024-10-16 ENCOUNTER — Ambulatory Visit: Admitting: Family Medicine

## 2024-10-16 VITALS — BP 124/82 | HR 86 | Ht 77.0 in | Wt 216.4 lb

## 2024-10-16 DIAGNOSIS — F419 Anxiety disorder, unspecified: Secondary | ICD-10-CM

## 2024-10-16 DIAGNOSIS — F32A Depression, unspecified: Secondary | ICD-10-CM

## 2024-10-16 DIAGNOSIS — R202 Paresthesia of skin: Secondary | ICD-10-CM

## 2024-10-16 DIAGNOSIS — Z85118 Personal history of other malignant neoplasm of bronchus and lung: Secondary | ICD-10-CM

## 2024-10-16 DIAGNOSIS — C3490 Malignant neoplasm of unspecified part of unspecified bronchus or lung: Secondary | ICD-10-CM

## 2024-10-16 MED ORDER — SERTRALINE HCL 50 MG PO TABS: 50.0000 mg | ORAL_TABLET | Freq: Every day | ORAL | 1 refills | Status: AC

## 2024-10-16 NOTE — Progress Notes (Signed)
 "  Patient Office Visit  Assessment & Plan:  Tingling in extremities -     Protein electrophoresis, serum  Malignant neoplasm of lung, unspecified laterality, unspecified part of lung (HCC)  Anxiety and depression -     Sertraline  HCl; Take 1 tablet (50 mg total) by mouth daily.  Dispense: 90 tablet; Refill: 1   Assessment and Plan    Idiopathic polyneuropathy Intermittent neuropathy symptoms in hands and feet, primarily in finger tips. No clear etiology, idiopathic neuropathy considered. Labs normal, differential includes multiple myeloma, HIV, syphilis. Symptoms bothersome but no weakness or severe pain. Declined medication due to side effect concerns. - Ordered SPEP to rule out multiple myeloma. - Continue B12 supplementation, consider increasing to 500 mcg if symptoms persist. - Continue alpha lipoic acid supplementation. - Follow up with neurology appointment in March.  History of malignant neoplasm of lung Recent CT scan showed no recurrence. No post-surgery treatments. Regular follow-ups with lung specialist for reassurance. - Continue biannual follow-ups with lung specialist.  Anxiety and depression Current low dose sertraline . Symptoms include worry and stress. Considering increasing sertraline  dose. Therapy ongoing, effectiveness uncertain. Motivated with some mood improvement. - Increased sertraline  to 50 mg. - Continue therapy sessions. - Will follow up in March to assess response to medication adjustment.          No follow-ups on file.   Subjective:    Patient ID: Micheal Baker, male    DOB: 02-Jun-1946  Age: 79 y.o. MRN: [id number]  Chief Complaint  Patient presents with   Follow-up    ER f/u for neuropathy. Would like to discuss increasing Sertraline .    HPI Discussed the use of AI scribe software for clinical note transcription with the patient, who gave verbal consent to proceed.     History of Present Illness Micheal Baker is a 79 year old male with  lung cancer who presents with new onset neuropathy in the fingers. Patient is right hand dominant and had previous carpal tunnel surgery many years ago.   He has been experiencing numbness and a 'creepy crawly' sensation in his fingers since last Friday. Initially, the numbness was in the left index finger, then progressed to involve the right pinky, and eventually all fingers on both hands. The symptoms are intermittent and described as 'a little ant on your skin' rather than 'pins and needles'. No weakness or dropping things, and his strength remains good. The symptoms do not seem to be exacerbated by specific positions or activities. patient went to ED last week with the concern it could be a TIA/Stroke  He has a history of neuropathy in his feet and toes, which has been present for years and has stabilized. This neuropathy affects his balance, and he is concerned about falling. He has been using alpha lipoic acid, B12, and B complex supplements, which he believes have helped with his foot symptoms but not fingers  He underwent carpal tunnel surgery on his right hand about four to five years ago, which he believes was beneficial. He denies any recent severe pain, weakness, or dropping objects.  He has a history of lung cancer for which he underwent surgical removal but did not receive chemotherapy. He is currently under surveillance with no recurrence noted.  He is currently taking B12 supplements, though he is unsure of the exact dosage, and he is taking alpha lipoic acid and B complex vitamins.  He engages in stained glass projects, which have become more challenging due to the  finger symptoms. He is right-handed and finds the symptoms interfere with his ability to work on these projects.  Physical Exam NEUROLOGICAL: Grip strength strong bilaterally. Altered sensation in right index and pinky fingers. Altered sensation in left hand, all fingers except thumb. Pulses intact  bilaterally.  Results Labs Magnesium: Within normal limits Urinalysis: Within normal limits CBC: No anemia Kidney function: Within normal limits B12: Low-normal, 428 Thyroid : Within normal limits Iron: Within normal limits  Radiology Chest CT: No evidence of malignancy recurrence (lungs)  Assessment and Plan Idiopathic polyneuropathy Intermittent neuropathy symptoms in hands and feet, primarily in finger tips. No clear etiology, idiopathic neuropathy considered. Labs normal, differential includes multiple myeloma, B12 deficiency.  Symptoms bothersome but no weakness or severe pain. Declined medication due to side effect concerns ie Gabapentin or Lyrica.  - Ordered SPEP to rule out multiple myeloma. - Continue B12 supplementation, consider increasing to 500 mcg if symptoms persist. - Continue alpha lipoic acid supplementation. - Follow up with neurology appointment in March.  History of malignant neoplasm of lung Recent CT scan showed no recurrence. No post-surgery treatments. Regular follow-ups with lung specialist for reassurance. - Continue biannual follow-ups with lung specialist.  Anxiety and depression Current low dose sertraline . Symptoms include worry and stress. Considering increasing sertraline  dose. Therapy ongoing, effectiveness uncertain. Motivated with some mood improvement. Patient has upcoming trip to [place]  - Increased sertraline  to 50 mg. - Continue therapy sessions. - Will follow up in March to assess response to medication adjustment.    The 10-year ASCVD risk score (Arnett DK, et al., 2019) is: 30.4%  Past Medical History:  Diagnosis Date   Acute medial meniscus tear of left knee    Depression    Frequent PVCs    GERD (gastroesophageal reflux disease)    History of hiatal hernia    History of inguinal hernia    History of kidney stones    Hypertension    Hypothyroidism    Lung cancer (HCC)    NSVT (nonsustained ventricular tachycardia) (HCC)     Osteoarthritis    Sleep apnea    Thyroid  disease    Past Surgical History:  Procedure Laterality Date   BIOPSY  12/01/2022   Procedure: BIOPSY;  Surgeon: Cindie Carlin POUR, DO;  Location: [facility];  Service: Endoscopy;;   BRONCHIAL BIOPSY  02/07/2023   Procedure: BRONCHIAL BIOPSIES;  Surgeon: Brenna Adine CROME, DO;  Location: [facility];  Service: Pulmonary;;   BRONCHIAL NEEDLE ASPIRATION BIOPSY  02/07/2023   Procedure: BRONCHIAL NEEDLE ASPIRATION BIOPSIES;  Surgeon: Brenna Adine CROME, DO;  Location: [facility];  Service: Pulmonary;;   CATARACT EXTRACTION     left   COLONOSCOPY N/A 04/08/2016   Surgeon: Margo CROME Haddock, Micheal; one 6 mm polyp in the cecum removed, one 4 mm polyp in the descending colon removed, nonbleeding internal hemorrhoids.  Pathology with hyperplastic polyps.  Recommended colonoscopy in 10 years.   COLONOSCOPY  12/01/2022   COLONOSCOPY WITH PROPOFOL  N/A 12/01/2022   Procedure: COLONOSCOPY WITH PROPOFOL ;  Surgeon: Cindie Carlin POUR, DO;  Location: [facility];  Service: Endoscopy;  Laterality: N/A;  8:15 am, asa 3   endosocopy  12/01/2022   ESOPHAGOGASTRODUODENOSCOPY (EGD) WITH PROPOFOL  N/A 12/01/2022   Procedure: ESOPHAGOGASTRODUODENOSCOPY (EGD) WITH PROPOFOL ;  Surgeon: Cindie Carlin POUR, DO;  [facility]: [facility];  Service: Endoscopy;  Laterality: N/A;   EYE SURGERY     left-scar tissue   FIDUCIAL MARKER PLACEMENT  02/07/2023   Procedure: FIDUCIAL MARKER PLACEMENT;  Surgeon: Brenna Adine CROME, DO;  Location: [facility];  Service: Pulmonary;;   groin surgery Right 2010   growth removed    KNEE ARTHROSCOPY     left   KNEE ARTHROSCOPY WITH LATERAL MENISECTOMY Right 03/11/2015   Procedure: RIGHT KNEE ARTHROSCOPY WITH MEDIALAND LATERAL MENISECTOMY;  Surgeon: Lamar Millman, Micheal;  Location: [facility];  Service: Orthopedics;  Laterality: Right;   KNEE ARTHROSCOPY WITH MEDIAL MENISECTOMY Left 06/27/2014   Procedure: LEFT KNEE ARTHROSCOPY WITH PARTIAL  MEDIAL AND LATERAL MENISECTOMIES AND CHONDROPLASTY;  Surgeon: Lamar DELENA Millman, Micheal;  Location: [facility];  Service: Orthopedics;  Laterality: Left;   KNEE ARTHROSCOPY WITH MEDIAL MENISECTOMY Right 03/11/2015   Procedure: KNEE ARTHROSCOPY WITH MEDIAL MENISECTOMY;  Surgeon: Lamar Millman, Micheal;  Location: [facility];  Service: Orthopedics;  Laterality: Right;   LEFT HEART CATHETERIZATION WITH CORONARY ANGIOGRAM N/A 09/14/2011   Procedure: LEFT HEART CATHETERIZATION WITH CORONARY ANGIOGRAM;  Surgeon: Vinie KYM Maxcy, Micheal;  [facility];  Service: Cardiovascular;  Laterality: N/A;   LYMPH NODE DISSECTION Left 03/22/2023   Procedure: LYMPH NODE DISSECTION;  Surgeon: Shyrl Linnie KIDD, Micheal;  Location: [facility];  Service: Thoracic;  Laterality: Left;   POLYPECTOMY  04/08/2016   Procedure: POLYPECTOMY;  Surgeon: Margo CROME Haddock, Micheal;  [facility]: [facility];  Service: Endoscopy;;  colon    POLYPECTOMY  12/01/2022   POLYPECTOMY  12/01/2022   Procedure: POLYPECTOMY;  Surgeon: Cindie Carlin POUR, DO;  Location: [facility];  Service: Endoscopy;;   RETINAL DETACHMENT SURGERY Left 2002   Dr. Carmela   Social History[1] Family History  Problem Relation Age of Onset   Hypertension Mother    Hypertension Father    Heart attack Father    Diabetes Father    Asthma Daughter    Colon cancer Neg Hx    Stomach cancer Neg Hx    Allergies[2]  ROS    Objective:    BP 124/82   Pulse 86   Ht 6' 5 (1.956 m)   Wt 216 lb 6 oz (98.1 kg)   SpO2 97%   BMI 25.66 kg/m  BP Readings from Last 3 Encounters:  10/16/24 124/82  10/11/24 (!) 181/104  10/02/24 134/72   Wt Readings from Last 3 Encounters:  10/16/24 216 lb 6 oz (98.1 kg)  10/11/24 210 lb (95.3 kg)  10/02/24 214 lb 2 oz (97.1 kg)    Physical Exam Vitals and nursing note reviewed.  Constitutional:      General: He is not in acute distress.    Appearance: Normal appearance.  HENT:     Head:  Normocephalic.     Right Ear: Tympanic membrane, ear canal and external ear normal.     Left Ear: Tympanic membrane, ear canal and external ear normal.  Eyes:     Extraocular Movements: Extraocular movements intact.     Pupils: Pupils are equal, round, and reactive to light.  Cardiovascular:     Rate and Rhythm: Normal rate and regular rhythm.     Heart sounds: Normal heart sounds.  Pulmonary:     Effort: Pulmonary effort is normal.     Breath sounds: Normal breath sounds.  Musculoskeletal:     Right lower leg: No edema.     Left lower leg: No edema.  Neurological:     General: No focal deficit present.     Mental Status: He is alert and oriented to person, place, and time. Mental status is  at baseline.     Sensory: No sensory deficit.     Motor: No weakness.     Comments: Negative Tinel's, negative Phalen's bilaterally, neurovascularly intact.   Psychiatric:        Mood and Affect: Mood normal.        Behavior: Behavior normal.      No results found for any visits on 10/16/24.          [1]  Social History Tobacco Use   Smoking status: Never    Passive exposure: Never   Smokeless tobacco: Never   Tobacco comments:    06/10/14 1968- Tried it once and didn't like it- AJ  Vaping Use   Vaping status: Never Used  Substance Use Topics   Alcohol  use: Yes    Alcohol /week: 0.0 standard drinks of alcohol     Comment: occasionally   Drug use: No  [2]  Allergies Allergen Reactions   Avelox [Moxifloxacin] Other (See Comments)    Caused tendonitis   Penicillins Other (See Comments)    Chills    "

## 2024-10-18 LAB — PROTEIN ELECTROPHORESIS, SERUM
Albumin ELP: 3.9 g/dL (ref 3.8–4.8)
Alpha 1: 0.3 g/dL (ref 0.2–0.3)
Alpha 2: 0.7 g/dL (ref 0.5–0.9)
Beta 2: 0.3 g/dL (ref 0.2–0.5)
Beta Globulin: 0.4 g/dL (ref 0.4–0.6)
Gamma Globulin: 0.9 g/dL (ref 0.8–1.7)
Total Protein: 6.5 g/dL (ref 6.1–8.1)

## 2024-10-21 ENCOUNTER — Ambulatory Visit (HOSPITAL_COMMUNITY)

## 2024-10-21 ENCOUNTER — Other Ambulatory Visit

## 2024-10-24 ENCOUNTER — Ambulatory Visit: Admitting: Gastroenterology

## 2024-11-01 ENCOUNTER — Ambulatory Visit: Admitting: Urology

## 2024-11-27 ENCOUNTER — Ambulatory Visit: Admitting: Family Medicine

## 2025-01-22 ENCOUNTER — Ambulatory Visit: Admitting: Neurology

## 2025-04-07 ENCOUNTER — Inpatient Hospital Stay

## 2025-04-14 ENCOUNTER — Inpatient Hospital Stay: Admitting: Internal Medicine

## 2025-05-02 ENCOUNTER — Encounter (INDEPENDENT_AMBULATORY_CARE_PROVIDER_SITE_OTHER): Admitting: Ophthalmology
